# Patient Record
Sex: Male | Born: 1935
Health system: Southern US, Community
[De-identification: ages and names within clinical notes are randomized; demographics above are authoritative.]

## PROBLEM LIST (undated history)

## (undated) DIAGNOSIS — I10 Essential (primary) hypertension: Secondary | ICD-10-CM

## (undated) DIAGNOSIS — N400 Enlarged prostate without lower urinary tract symptoms: Secondary | ICD-10-CM

## (undated) DIAGNOSIS — R519 Headache, unspecified: Secondary | ICD-10-CM

## (undated) DIAGNOSIS — I495 Sick sinus syndrome: Secondary | ICD-10-CM

## (undated) DIAGNOSIS — I4819 Other persistent atrial fibrillation: Secondary | ICD-10-CM

## (undated) DIAGNOSIS — K219 Gastro-esophageal reflux disease without esophagitis: Secondary | ICD-10-CM

## (undated) DIAGNOSIS — D649 Anemia, unspecified: Secondary | ICD-10-CM

## (undated) DIAGNOSIS — C4491 Basal cell carcinoma of skin, unspecified: Secondary | ICD-10-CM

## (undated) DIAGNOSIS — E039 Hypothyroidism, unspecified: Secondary | ICD-10-CM

## (undated) DIAGNOSIS — Z992 Dependence on renal dialysis: Secondary | ICD-10-CM

## (undated) DIAGNOSIS — M1990 Unspecified osteoarthritis, unspecified site: Secondary | ICD-10-CM

## (undated) DIAGNOSIS — R51 Headache: Secondary | ICD-10-CM

## (undated) DIAGNOSIS — Z8701 Personal history of pneumonia (recurrent): Secondary | ICD-10-CM

## (undated) DIAGNOSIS — N186 End stage renal disease: Secondary | ICD-10-CM

## (undated) DIAGNOSIS — H9190 Unspecified hearing loss, unspecified ear: Secondary | ICD-10-CM

## (undated) DIAGNOSIS — G5602 Carpal tunnel syndrome, left upper limb: Secondary | ICD-10-CM

## (undated) DIAGNOSIS — I2510 Atherosclerotic heart disease of native coronary artery without angina pectoris: Secondary | ICD-10-CM

## (undated) DIAGNOSIS — Z950 Presence of cardiac pacemaker: Secondary | ICD-10-CM

## (undated) HISTORY — DX: Atherosclerotic heart disease of native coronary artery without angina pectoris: I25.10

## (undated) HISTORY — PX: COLONOSCOPY W/ BIOPSIES AND POLYPECTOMY: SHX1376

## (undated) HISTORY — DX: Other persistent atrial fibrillation: I48.19

## (undated) HISTORY — PX: APPENDECTOMY: SHX54

## (undated) HISTORY — PX: OTHER SURGICAL HISTORY: SHX169

## (undated) HISTORY — DX: End stage renal disease: Z99.2

## (undated) HISTORY — DX: Sick sinus syndrome: I49.5

## (undated) HISTORY — DX: Benign prostatic hyperplasia without lower urinary tract symptoms: N40.0

## (undated) HISTORY — PX: CHOLECYSTECTOMY: SHX55

## (undated) HISTORY — PX: TONSILLECTOMY: SUR1361

## (undated) HISTORY — DX: Basal cell carcinoma of skin, unspecified: C44.91

## (undated) HISTORY — PX: LAMINECTOMY: SHX219

## (undated) HISTORY — DX: Anemia, unspecified: D64.9

## (undated) HISTORY — DX: Hypothyroidism, unspecified: E03.9

## (undated) HISTORY — DX: Essential (primary) hypertension: I10

## (undated) HISTORY — PX: EYE SURGERY: SHX253

## (undated) HISTORY — DX: Dependence on renal dialysis: N18.6

## (undated) HISTORY — DX: Personal history of pneumonia (recurrent): Z87.01

---

## 1997-05-07 ENCOUNTER — Inpatient Hospital Stay (HOSPITAL_COMMUNITY): Admission: RE | Admit: 1997-05-07 | Discharge: 1997-05-08 | Payer: Self-pay | Admitting: Neurosurgery

## 2000-12-03 ENCOUNTER — Encounter: Payer: Self-pay | Admitting: Neurosurgery

## 2000-12-03 ENCOUNTER — Ambulatory Visit (HOSPITAL_COMMUNITY): Admission: RE | Admit: 2000-12-03 | Discharge: 2000-12-03 | Payer: Self-pay | Admitting: Neurosurgery

## 2000-12-20 ENCOUNTER — Encounter: Admission: RE | Admit: 2000-12-20 | Discharge: 2000-12-20 | Payer: Self-pay | Admitting: Neurosurgery

## 2000-12-20 ENCOUNTER — Encounter: Payer: Self-pay | Admitting: Neurosurgery

## 2001-08-30 ENCOUNTER — Encounter: Payer: Self-pay | Admitting: Orthopedic Surgery

## 2001-08-30 ENCOUNTER — Ambulatory Visit (HOSPITAL_COMMUNITY): Admission: RE | Admit: 2001-08-30 | Discharge: 2001-08-30 | Payer: Self-pay | Admitting: Orthopedic Surgery

## 2001-09-07 ENCOUNTER — Encounter: Payer: Self-pay | Admitting: Orthopedic Surgery

## 2001-09-07 ENCOUNTER — Encounter: Admission: RE | Admit: 2001-09-07 | Discharge: 2001-09-07 | Payer: Self-pay | Admitting: Orthopedic Surgery

## 2001-09-08 ENCOUNTER — Ambulatory Visit (HOSPITAL_BASED_OUTPATIENT_CLINIC_OR_DEPARTMENT_OTHER): Admission: RE | Admit: 2001-09-08 | Discharge: 2001-09-09 | Payer: Self-pay | Admitting: Orthopedic Surgery

## 2004-07-03 ENCOUNTER — Inpatient Hospital Stay (HOSPITAL_COMMUNITY): Admission: RE | Admit: 2004-07-03 | Discharge: 2004-07-04 | Payer: Self-pay | Admitting: Neurosurgery

## 2005-02-11 ENCOUNTER — Inpatient Hospital Stay (HOSPITAL_COMMUNITY): Admission: RE | Admit: 2005-02-11 | Discharge: 2005-02-13 | Payer: Self-pay | Admitting: Neurosurgery

## 2006-01-19 HISTORY — PX: PACEMAKER PLACEMENT: SHX43

## 2006-06-20 ENCOUNTER — Inpatient Hospital Stay (HOSPITAL_COMMUNITY): Admission: AD | Admit: 2006-06-20 | Discharge: 2006-06-25 | Payer: Self-pay | Admitting: Cardiology

## 2006-06-21 ENCOUNTER — Encounter: Payer: Self-pay | Admitting: Cardiology

## 2007-06-14 ENCOUNTER — Inpatient Hospital Stay (HOSPITAL_BASED_OUTPATIENT_CLINIC_OR_DEPARTMENT_OTHER): Admission: RE | Admit: 2007-06-14 | Discharge: 2007-06-14 | Payer: Self-pay | Admitting: Cardiology

## 2008-09-05 IMAGING — CR DG CHEST 1V PORT
1 series · 1 of 1 positions shown · non-contrast
Comparison: none

Clinical: Bradycardia

Portable chest at 6660:
Comparison 02/09/2005. Mild enlargement of cardiac silhouette. Lungs are clear.
No effusion. Tortuous thoracic aorta. Postop changes in the right shoulder.

[view not recorded]
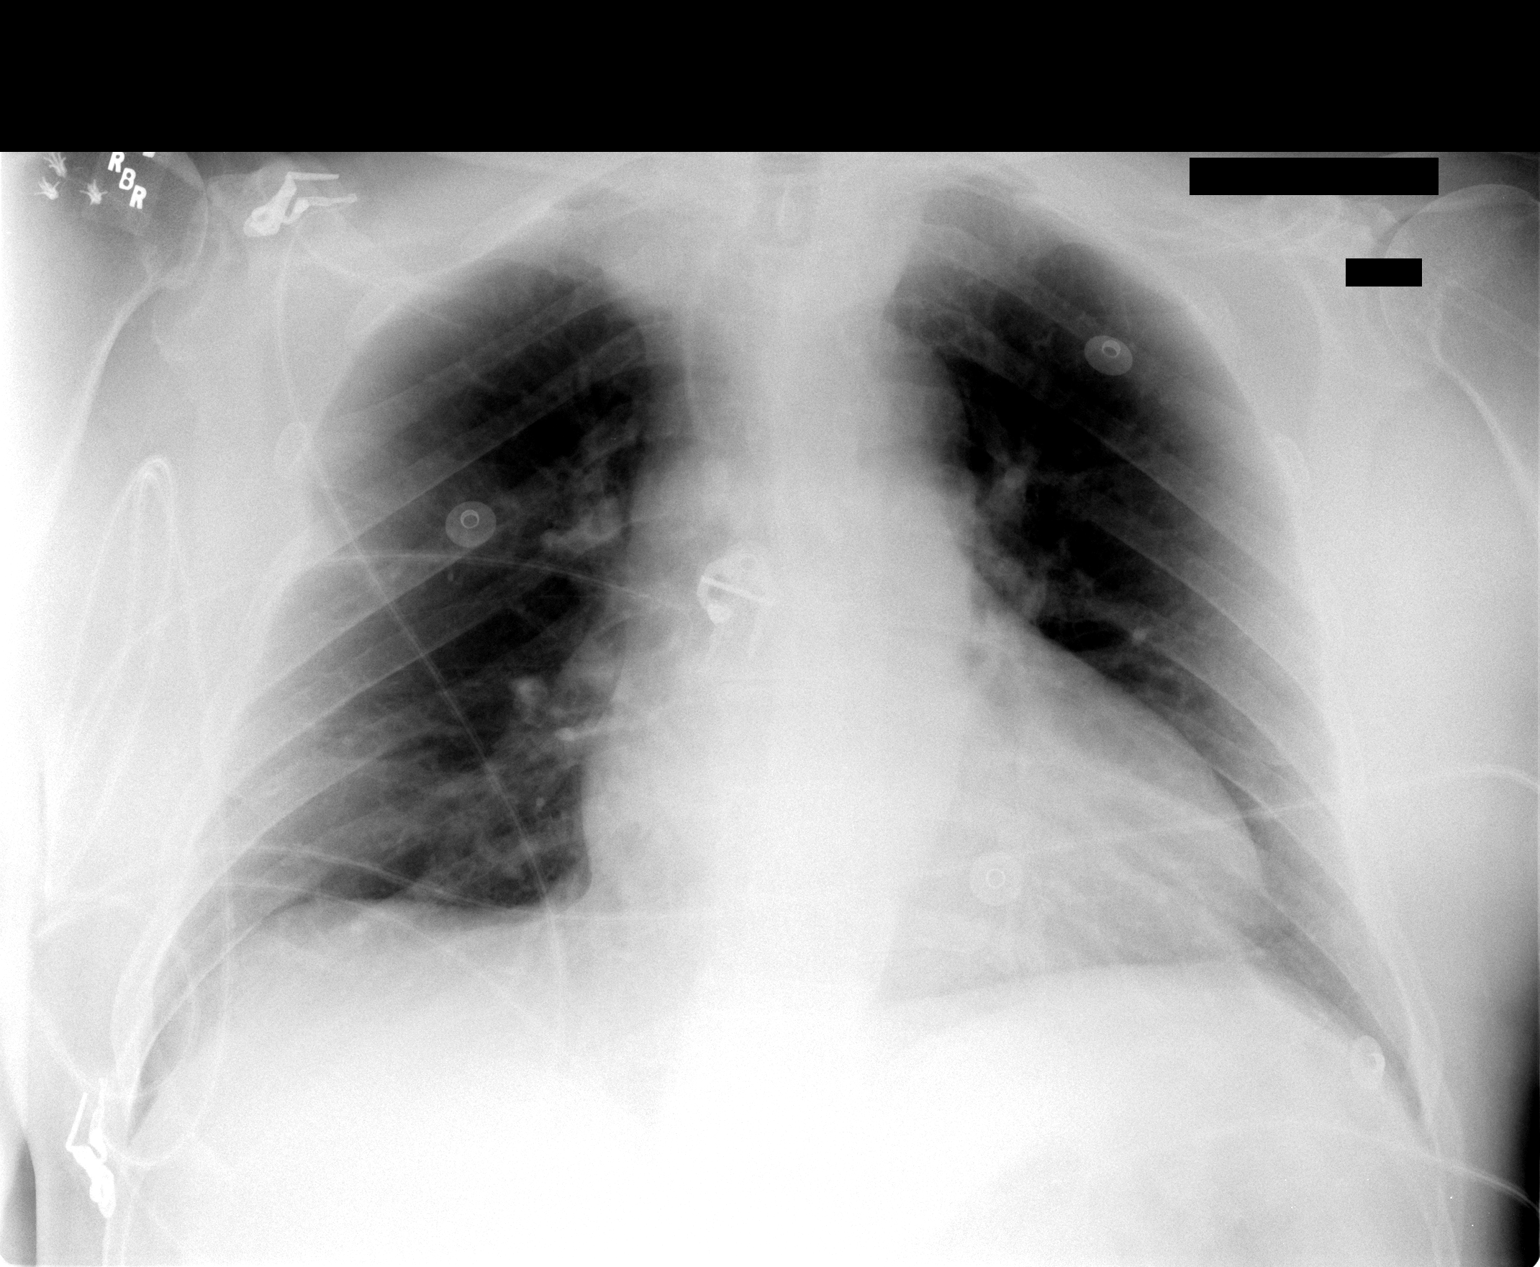

[1 of 1 positions shown; findings below may reference images not displayed]

IMPRESSION: 1. Stable mild cardiomegaly

## 2008-09-09 IMAGING — CR DG CHEST 1V PORT
1 series · 1 of 1 positions shown · non-contrast
Comparison: 06/20/06.

CLINICAL DATA: Bradycardia ? pacer placement. 
 PORTABLE CHEST ? 1 VIEW:

[view not recorded]
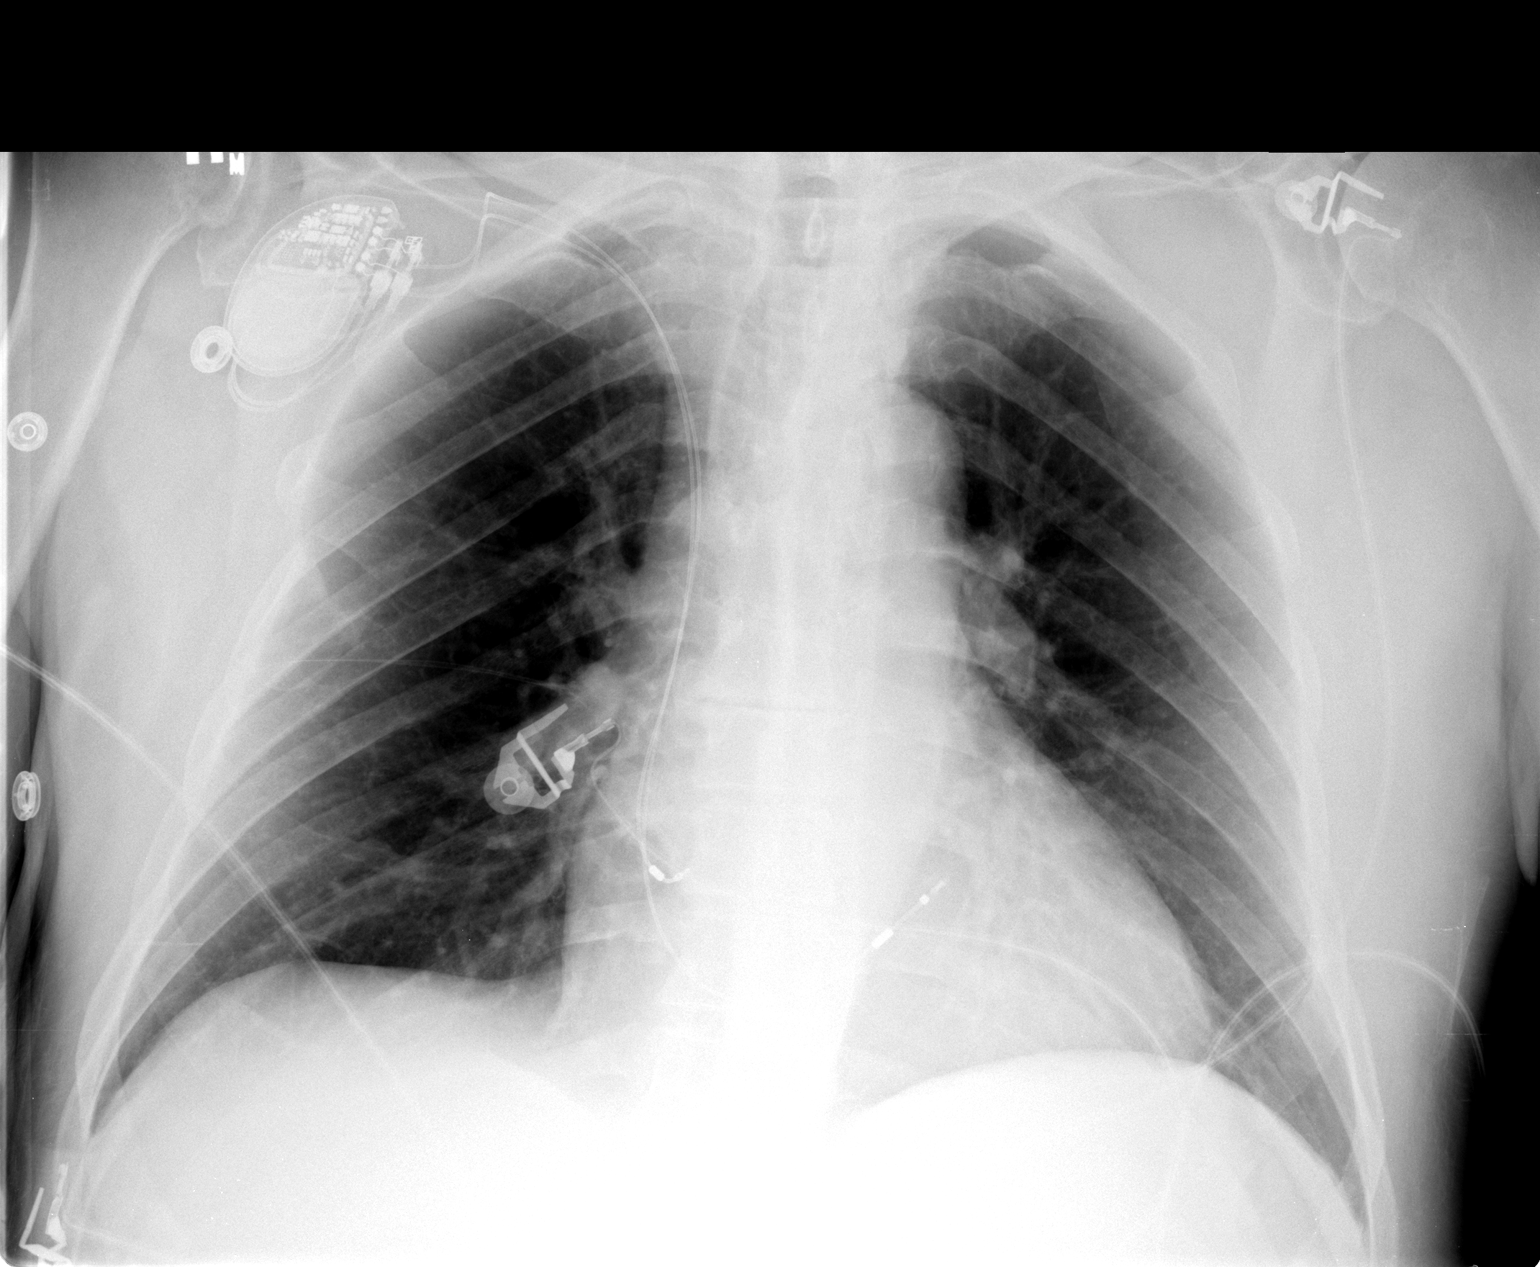

[1 of 1 positions shown; findings below may reference images not displayed]

FINDINGS: Heart size borderline enlarged considering AP projection.  There is no congestive heart failure or active disease.  A dual lead permanent cardiac pacer has been placed with RA and RV leads.  No pneumothorax.
IMPRESSION: 1.  Borderline cardiomegaly.  
 2.  Permanent cardiac pacer placed ? no pneumothorax. 
 3.  No active disease.

## 2009-06-03 ENCOUNTER — Encounter: Admission: RE | Admit: 2009-06-03 | Discharge: 2009-06-03 | Payer: Self-pay | Admitting: Cardiology

## 2009-10-21 ENCOUNTER — Ambulatory Visit: Payer: Self-pay | Admitting: Cardiology

## 2009-10-26 ENCOUNTER — Encounter: Payer: Self-pay | Admitting: Internal Medicine

## 2009-11-29 ENCOUNTER — Ambulatory Visit: Payer: Self-pay | Admitting: Internal Medicine

## 2010-02-18 NOTE — Assessment & Plan Note (Signed)
Summary: pacer check/medtronic   Current Medications (verified): 1)  Furosemide 80 Mg Tabs (Furosemide) .... Take 1 Tablet By Mouth Every Morning and 1/2 Tablet in Evening 2)  Levothyroxine Sodium 100 Mcg Tabs (Levothyroxine Sodium) .... Take 1 Tablet By Mouth Once Daily 3)  Amlodipine Besylate 5 Mg Tabs (Amlodipine Besylate) .... Take 1 Tablet By Mouth Once Daily 4)  Omeprazole 20 Mg Cpdr (Omeprazole) .... Take 1 Capsule By Mouth Once Daily 5)  Aspir-Low 81 Mg Tbec (Aspirin) .... Take 1 Tablet By Mouth Once Daily 6)  Flomax 0.4 Mg Caps (Tamsulosin Hcl) .... Take 1 Capsule By Mouth Once Daily 7)  Neurontin 400 Mg Caps (Gabapentin) .... Take 1 Capsule By Mouth Once Daily 8)  Amiodarone Hcl 200 Mg Tabs (Amiodarone Hcl) .... Take 1 Tablet By Mouth Once Daily 9)  Alprazolam 1 Mg Tabs (Alprazolam) .... Take 1 Tablet By Mouth As Needed 10)  Lortab 10-500 Mg Tabs (Hydrocodone-Acetaminophen) .... Take As Needed 11)  Ranexa 500 Mg Xr12h-Tab (Ranolazine) .... Take 1 Tablet By Mouth Once Daily 12)  Folic Acid .... Take 1 Tablet By Mouth Once Daily 13)  Multivitamins  Tabs (Multiple Vitamin) .... Take 1 Tablet By Mouth Once Daily 14)  Atenolol 25 Mg Tabs (Atenolol) .... Take 1 Tablet By Mouth Once Daily  Allergies (verified): 1)  ! * Oxycontin   PPM Specifications Following MD:  Thompson Grayer, MD     Referring MD:  Servando Salina PPM Vendor:  Medtronic     PPM Model Number:  [id number]     PPM Serial Number:  [id number] PPM DOI:  06/23/2006     PPM Implanting MD:  Romeo Apple, MD  Lead 1    Location: RA     DOI: 06/23/2006     Model #: [id number]     Serial #: [id number]     Status: active Lead 2    Location: RV     DOI: 06/23/2006     Model #: [id number]     Serial #: [id number]     Status: active  Magnet Response Rate:  BOL 85 ERI 65  Indications:  A-fib   PPM Follow Up Battery Voltage:  3.02 V       PPM Device Measurements Atrium  Amplitude: 1.1 mV, Impedance: 440 ohms, Threshold: 0.5 V at 0.4  msec Right Ventricle  Amplitude: 8.1 mV, Impedance: 512 ohms, Threshold: 1.0 V at 0.4 msec  Episodes MS Episodes:  0     Ventricular High Rate:  0     Atrial Pacing:  99.8%     Ventricular Pacing:  0.2%  Parameters Mode:  MVP     Lower Rate Limit:  60     Upper Rate Limit:  130 Paced AV Delay:  180     Sensed AV Delay:  150 Next Cardiology Appt Due:  03/10/2010 Tech Comments:  [facility]--NORMAL DEVICE FUNCTION.  NO EPISODES RECORDED SINCE LAST CHECK.  NO CHANGES MADE. PT TRANSFERRED THRU [facility].  ROV W/JA 03-10-10 @ 1430--PT AWARE OF APPT. PT WOULD LIKE TO CONTINUE [facility] CHECKS AFTER JA VISIT. Shelly Bombard  November 29, 2009 2:02 PM

## 2010-02-18 NOTE — Assessment & Plan Note (Signed)
Summary: pacer check/medtronic  Comments Pt was not seen this day (11/08/09).  He had another appt and could not wait. Appt was rescheduled to 11/11 and pt was seen on that day. Gurney Maxin, RN, BSN  December 06, 2009 9:38 AM     PPM Specifications Following MD:  Thompson Grayer, MD     Referring MD:  Servando Salina PPM Vendor:  Medtronic     PPM Model Number:  [id number]     [facility] Serial Number:  [id number] [facility] PPM DOI:  06/23/2006     PPM Implanting MD:  Romeo Apple, MD  Lead 1    Location: RA     DOI: 06/23/2006     Model #: [id number]     Serial #: [id number]     Status: active Lead 2    Location: RV     DOI: 06/23/2006     Model #: [id number]     Serial #: [id number]     Status: active  Magnet Response Rate:  BOL 85 ERI 65  Indications:  A-fib

## 2010-02-18 NOTE — Miscellaneous (Signed)
Summary: Device preload  Clinical Lists Changes  Observations: Added new observation of PPM INDICATN: A-fib (10/26/2009 14:12) Added new observation of MAGNET RTE: BOL 85 ERI 65 (10/26/2009 14:12) Added new observation of PPMLEADSTAT2: active (10/26/2009 14:12) Added new observation of PPMLEADSER2: [id number]  (10/26/2009 14:12) Added new observation of PPMLEADMOD2: 4470  (10/26/2009 14:12) Added new observation of PPMLEADDOI2: 06/23/2006  (10/26/2009 14:12) Added new observation of PPMLEADLOC2: RV  (10/26/2009 14:12) Added new observation of PPMLEADSTAT1: active  (10/26/2009 14:12) Added new observation of PPMLEADSER1: [id number]  (10/26/2009 14:12) Added new observation of PPMLEADMOD1: 4469  (10/26/2009 14:12) Added new observation of PPMLEADDOI1: 06/23/2006  (10/26/2009 14:12) Added new observation of PPMLEADLOC1: RA  (10/26/2009 14:12) Added new observation of PPM IMP MD: Romeo Apple, MD  (10/26/2009 14:12) Added new observation of PPM DOI: 06/23/2006  (10/26/2009 14:12) Added new observation of PPM SERL#: [id number]  (10/26/2009 14:12) Added new observation of PPM MODL#: [id number]  (10/26/2009 14:12) Added new observation of PACEMAKERMFG: Medtronic  (10/26/2009 14:12) Added new observation of PPM REFER MD: Dorothyann Peng Tennant,MD  (10/26/2009 14:12) Added new observation of PACEMAKER MD: Thompson Grayer, MD  (10/26/2009 14:12)      PPM Specifications Following MD:  Thompson Grayer, MD     Referring MD:  Servando Salina PPM Vendor:  Medtronic     PPM Model Number:  [id number]     PPM Serial Number:  [id number] PPM DOI:  06/23/2006     PPM Implanting MD:  Romeo Apple, MD  Lead 1    Location: RA     DOI: 06/23/2006     Model #: [id number]     Serial #: [id number]     Status: active Lead 2    Location: RV     DOI: 06/23/2006     Model #: [id number]     Serial #: [id number]     Status: active  Magnet Response Rate:  BOL 85 ERI 65  Indications:  A-fib

## 2010-02-18 NOTE — Cardiovascular Report (Signed)
Summary: Office Visit   Office Visit   Imported By: Sallee Provencal 12/09/2009 15:50:15  _____________________________________________________________________  External Attachment:    Type:   Image     Comment:   External Document

## 2010-03-10 ENCOUNTER — Encounter (INDEPENDENT_AMBULATORY_CARE_PROVIDER_SITE_OTHER): Payer: Medicare Other | Admitting: Internal Medicine

## 2010-03-10 ENCOUNTER — Encounter: Payer: Self-pay | Admitting: Internal Medicine

## 2010-03-10 DIAGNOSIS — I10 Essential (primary) hypertension: Secondary | ICD-10-CM | POA: Insufficient documentation

## 2010-03-10 DIAGNOSIS — I4891 Unspecified atrial fibrillation: Secondary | ICD-10-CM

## 2010-03-10 DIAGNOSIS — I495 Sick sinus syndrome: Secondary | ICD-10-CM

## 2010-03-18 NOTE — Cardiovascular Report (Signed)
Summary: Card Device Clinic/ QUICK LOOK  Card Device Clinic/ QUICK LOOK   Imported By: Bartholomew Boards 03/11/2010 16:38:41  _____________________________________________________________________  External Attachment:    Type:   Image     Comment:   External Document

## 2010-03-18 NOTE — Assessment & Plan Note (Signed)
Summary: MDT PPM/[facility] PT/[facility]   Visit Type:  Pacemaker check Referring Provider:  Dr Doreatha Lew Primary Provider:  Dr Manuella Ghazi  CC:  pacemaker check.  History of Present Illness: Dale Torres is a pleasant 77 yo WM with a h/o sick sinus syndrome, paroxysmal atrial fibrillation, and HTN s/p PPM (MDT) by Dr Doreatha Lew 2008 who presents to establish care in the pacemaker clinic.  He presenting in 2008 with presyncope and was found to have bradycardia as well as afib.  He underwent PPM (MDT) implantation by Dr Doreatha Lew 06/23/06.  HE has done well since that time.  He denies any further presyncope or syncope.  He has been asymptomatic with afib.  He underwent cath 2009 which revealed nonobstructive CAD.  The patient remains active.  He is primarily limited by DJD. He denies CP, palpitations, orthopnea, or PND.  HE reports dypsnea with moderate activity.  He is otherwise without complaint today.  Current Medications (verified): 1)  Furosemide 80 Mg Tabs (Furosemide) .... Take 1 Tablet By Mouth Every Morning and 1/2 Tablet in Evening 2)  Levothyroxine Sodium 100 Mcg Tabs (Levothyroxine Sodium) .... Take 1 Tablet By Mouth Once Daily 3)  Amlodipine Besylate 5 Mg Tabs (Amlodipine Besylate) .... Take 1 Tablet By Mouth Once Daily 4)  Omeprazole 20 Mg Cpdr (Omeprazole) .... Take 1 Capsule By Mouth Once Daily 5)  Aspir-Low 81 Mg Tbec (Aspirin) .... Take 1 Tablet By Mouth Once Daily 6)  Flomax 0.4 Mg Caps (Tamsulosin Hcl) .... Take 1 Capsule By Mouth Once Daily 7)  Neurontin 400 Mg Caps (Gabapentin) .... Take 1 Capsule By Mouth Once Daily 8)  Amiodarone Hcl 200 Mg Tabs (Amiodarone Hcl) .... Take 1 Tablet By Mouth Once Daily 9)  Alprazolam 1 Mg Tabs (Alprazolam) .... Take 1 Tablet By Mouth As Needed 10)  Lortab 10-500 Mg Tabs (Hydrocodone-Acetaminophen) .... Take As Needed 11)  Ranexa 500 Mg Xr12h-Tab (Ranolazine) .... Take 1 Tablet By Mouth Once Daily 12)  Folic Acid .... Take 1 Tablet By Mouth Once Daily 13)   Multivitamins  Tabs (Multiple Vitamin) .... Take 1 Tablet By Mouth Once Daily 14)  Atenolol 25 Mg Tabs (Atenolol) .... Take 1 Tablet By Mouth Once Daily  Allergies (verified): 1)  ! * Oxycontin  Past History:  Past Medical History: SSS s/p PPM nonosbructive CAD hypertension glucose intolerance atrial fibrillation DJD poor hearing  Family History: + CAD  Social History: lives in [place].  Denies tobacco drinks 3-4 ounzes of scotch per week  Review of Systems       All systems are reviewed and negative except as listed in the HPI.   Vital Signs:  Patient profile:   77 year old male Height:      71 inches Weight:      203 pounds BMI:     28.42 Pulse rate:   65 / minute Resp:     16 per minute BP sitting:   170 / 85  (left arm)  Vitals Entered By: Levora Angel, CNA (March 10, 2010 2:39 PM)  Physical Exam  General:  elderly male, NAD Head:  normocephalic and atraumatic Eyes:  PERRLA/EOM intact; conjunctiva and lids normal. Ears:  + hearing aides Neck:  supple Lungs:  Clear bilaterally to auscultation and percussion. Heart:  RRR, no m/r/g Abdomen:  Bowel sounds positive; abdomen soft and non-tender without masses, organomegaly, or hernias noted. No hepatosplenomegaly. Msk:  Back normal, normal gait. Muscle strength and tone normal. Extremities:  No clubbing or cyanosis.  no edema Neurologic:  Alert and oriented x 3. Skin:  Intact without lesions or rashes.   PPM Specifications Following MD:  Thompson Grayer, MD     Referring MD:  Servando Salina PPM Vendor:  Medtronic     PPM Model Number:  [id number]     PPM Serial Number:  [id number] PPM DOI:  06/23/2006     PPM Implanting MD:  Romeo Apple, MD  Lead 1    Location: RA     DOI: 06/23/2006     Model #: [id number]     Serial #: [id number]     Status: active Lead 2    Location: RV     DOI: 06/23/2006     Model #: [id number]     Serial #: [id number]     Status: active  Magnet Response Rate:  BOL 85 ERI 65  Indications:   A-fib   Parameters Mode:  MVP     Lower Rate Limit:  60     Upper Rate Limit:  130 Paced AV Delay:  180     Sensed AV Delay:  150 MD Comments:  see scanned report  Impression & Recommendations:  Problem # 1:  SICK SINUS SYNDROME (ICD-427.81) normal pacemaker function see scanned report no changes  Problem # 2:  ATRIAL FIBRILLATION (ICD-427.31) stable on amiodarone TFTs and LFTs every 6 months per Dr Manuella Ghazi pt is very clear in his decision to decline coumadin at this time  Problem # 3:  ESSENTIAL HYPERTENSION, BENIGN (ICD-401.1) above goal today pt reports good BP control at home salt restriction is advised he will keep a log of home BP values for Dr Manuella Ghazi  Patient Instructions: 1)  [facility]link checks every 3 months 2)  return in 12 months

## 2010-06-03 NOTE — Cardiovascular Report (Signed)
NAMEHEATH, PENDERS                ACCOUNT NO.:  [id number]   MEDICAL RECORD NO.:  [id number]          PATIENT TYPE:  INP   LOCATION:  2014                         FACILITY:  [facility]   PHYSICIAN:  Ludwig Lean. Doreatha Lew, M.D.DATE OF BIRTH:  1925-08-26   DATE OF PROCEDURE:  06/23/2006  DATE OF DISCHARGE:                            CARDIAC CATHETERIZATION   PROCEDURE:  Implantation of dual-chamber pulse generator under  fluoroscopy with conversion from atrial fibrillation to sinus rhythm  using ibutilide infusion.   DESCRIPTION OF PROCEDURE:  The right subclavicular area was prepped and  draped.  The area was infiltrated with 1% Xylocaine.  Subcutaneous  pocket was created in the prepectoral fascia.  Two punctures were made  in the subclavian vein over top of the first rib.  Using 7-French [facility]  introducers, the atrial and ventricular leads were introduced.  The  ventricular lead was a Guidant Bipolar endocardial pacing lead, serial  number [id number], 52-cm lead.  The ventricular thresholds were 0.4 V,  capture at 0.5 msec pulse width.  R waves were 10.7 mV.  Impedance was  593 ohms.  Current was 0.7 mA.   The atrial lead was a Guidant model 4469, 45-cm lead, serial number [id number]  [id number] active fixation lead.  It was positioned on the high atrial  septum in what was felt to be Bachman's bundle.  It was an active  fixation lead.  The thresholds initially were unobtainable because the  patient was in atrial fibrillation.  Ibutilide 1 mg was given and the  patient was observed x15 minutes with a converted sinus rhythm.  Then,  the thresholds were obtained which show a capture threshold at 0.4 V at  0.5 msec pulse width.  Impedance was 472 ohms and P waves were 1.7 mV.  Current was 1.1 mA.  The leads were sutured in place.  Wound was flushed  with kanamycin solution.  The leads were connected to a Medtronic  EnRhythm model P1501DR.  Serial number was [id number].  The pulse  generator was  sutured in place.  The wound was closed with 2-0 and  subsequently 4-0 Vicryl.  Steri-Strips were applied.  The patient  tolerated procedure well.      Ludwig Lean. Doreatha Lew, M.D.  Electronically Signed     SNT/MEDQ  D:  06/23/2006  T:  06/24/2006  Job:  [id number]

## 2010-06-03 NOTE — Cardiovascular Report (Signed)
NAMESEYMOUR, BENNETTS                ACCOUNT NO.:  [id number]   MEDICAL RECORD NO.:  [id number]          PATIENT TYPE:  OIB   LOCATION:  1966                         FACILITY:  [facility]   PHYSICIAN:  Ludwig Lean. Doreatha Lew, M.D.DATE OF BIRTH:  07-12-1925   DATE OF PROCEDURE:  06/14/2007  DATE OF DISCHARGE:  06/14/2007                            CARDIAC CATHETERIZATION   PROCEDURE:  Left heart catheterization with selective coronary  angiography and left ventricular angiography.   HISTORY:  Dale Torres is referred for catheterization because of a vague  episode of chest fullness.  He has had persistent shortness of breath.  He had a history of atrial fibrillation, but maintained in sinus rhythm  on amiodarone and implanted functioning dual-chamber pacemaker.  He has  underlying left bundle-branch block.   PROCEDURE:  Left heart catheterization with selective coronary  angiography and left ventricular angiography.   TYPE AND SITE OF ENTRY:  Percutaneous, right femoral artery.   CATHETERS:  1. A 4-French four curved Judkins left coronary catheter.  2. A 4-French 3-DRC right coronary catheter.  3. A 4-French pigtail ventriculographic catheter.   CONTRAST MATERIAL:  Omnipaque.   MEDICATIONS GIVEN PRIOR TO THE PROCEDURE:  Valium 10 mg.   MEDICATIONS GIVEN DURING THE PROCEDURE:  None.   COMMENT:  The patient did receive hydration and Mucomyst prior to the  procedure.   HEMODYNAMIC DATA:  The aortic pressure was 135/59.  LV was 136/21.  On  pullback, the aortic pressure was 146/51 and LV pressure was 147/10-18.  There was no aortic valve gradient on pullback.   ANGIOGRAPHIC DATA:  Left ventricular angiogram was performed in the RAO  position.  There was mild anterior hypokinesia of the global ejection  fraction estimated to be at 55-60%.   CORONARY ARTERIES:  1. Left main coronary artery is normal.  2. Intermediate coronary artery is large.  It is normal.  3. Left circumflex is a  moderately large vessel.  It is normal.  4. Left anterior descending had a 50-60% stenosis in its midportion.      There are minor irregularities, otherwise.  5. Right coronary artery was a large dominant vessel.  There are      irregularities present.   OVERALL IMPRESSION:  1. Mild anterior hypokinesis with overall well-preserved global left      ventricular function.  2. There is a borderline normal left ventricular filling pressures.  3. Minimal coronary atherosclerosis with 50%-60% narrowing in the mid      left anterior descending with irregularities in the right coronary      artery and vessels normal, otherwise.   DISCUSSION:  It is felt that Dale Torres's symptoms are not related to  coronary artery disease.  We will try to manage him otherwise and  consider the possibility of the addition Ranexa.      Ludwig Lean. Doreatha Lew, M.D.  Electronically Signed     SNT/MEDQ  D:  06/14/2007  T:  06/15/2007  Job:  [id number]   cc:   Jerene Bears, MD  Dr. Manuella Ghazi

## 2010-06-03 NOTE — H&P (Signed)
Dale Torres, Dale Torres                ACCOUNT NO.:  [id number]   MEDICAL RECORD NO.:  [id number]          PATIENT TYPE:  INP   LOCATION:  2014                         FACILITY:  [facility]   PHYSICIAN:  Ludwig Lean. Doreatha Lew, M.D.DATE OF BIRTH:  1926/01/07   DATE OF ADMISSION:  06/20/2006  DATE OF DISCHARGE:                              HISTORY & PHYSICAL   CHIEF COMPLAINT:  Dizziness and shortness of breath.   HISTORY OF PRESENT ILLNESS:  Dale Torres is a very pleasant 77 year old  male who is hard of hearing.  He presents from [facility] with  symptomatic bradycardia.  He was admitted there on Jun 18, 2006.  He has  had shortness of breath over the past 1 year.  It occurs with minimal  activities.  He has had no frank chest pain.  He has had dizziness,which  began about 3 weeks ago, that has progressively gotten worse.  He has  had no frank syncope.  It has been worse with activity as well.  His  atenolol has been cut back.  He was noted to be bradycardic upon his  arrival there.  His sodium was off at 124.  Hemoglobin was only 10.  He  was subsequently transferred for further evaluation.   PAST MEDICAL HISTORY:  1. Left bundle-branch block, questionably new.  2. Hearing loss.  3. GERD.  4. Hypertension for 15 years.  5. BPH.  6. Osteoarthritis.  7. A history of laminectomy in 1989 and 1999.  8. A history of appendectomy.  9. A history of cholecystectomy.  10.Recent eye surgery 1 month ago.   ALLERGIES:  QUESTIONABLE PERCODAN.   MEDICATIONS:  He believes he is taking lisinopril, Flomax, doxazosin,  hydrochlorothiazide, atenolol, and naproxen.   FAMILY HISTORY:  Positive for heart disease in his parents.  He has had  2 older sisters who have had heart disease as well.  One has had bypass  surgery.   SOCIAL HISTORY:  He is a widower for the past 2 years.  He lives with  his daughter.  He has 3 sons.  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  He is a remote smoker  and has had remote alcohol use, but not  at present.   REVIEW OF SYSTEMS:  Positive for constipation, blurred vision, as well  as diffuse joint pain.  He is on chronic narcotics.   PHYSICAL EXAMINATION:  GENERAL:  He is pleasant.  He is hard of hearing.  VITAL SIGNS:  Blood pressure 160/60.  His heart rate is about 80 at  present.  SKIN:  Warm and dry.  Color is unremarkable.  LUNGS:  Clear.  HEART:  Regular rhythm.  He has a grade 2/6 murmur.  ABDOMEN:  Unremarkable.  EXTREMITIES:  Bounding pulses with no edema and no deformity.   LABORATORY DATA:  His labs drawn earlier showed his BUN was 22,  creatinine was 1.4.  Sodium, on arrival was 124.  Yesterday, it was 128.  CK and troponin were negative.  His TSH was normal.  Hemoglobin was 10,  hematocrit 28.  His MCV is 92.   His EKG shows left bundle-branch block with marked bradycardia.  It is  not known if this is new or not.  He is in sinus rhythm.   IMPRESSION:  1. Bradycardia, symptomatic.  His atenolol has been discontinued.  2. Left bundle-branch block, questionably new.  3. Hyponatremia.  4. Anemia, questionable etiology.  5. Significant osteoarthritis.  6. Long-standing hypertension.   PLAN:  He is admitted.  We will watch him on telemetry.  His beta-  blockers have been discontinued already.  We will treat him with Norvasc  as well as continued low-dose ACE inhibitor.  He will be placed on fluid  restrictions.  Labs will be followed appropriately.  We will check a 2-D  echocardiogram but he may require cardiac catheterization.      Doyle Askew, N.P.      Ludwig Lean. Doreatha Lew, M.D.  Electronically Signed    LC/MEDQ  D:  06/20/2006  T:  06/20/2006  Job:  [id number]

## 2010-06-03 NOTE — H&P (Signed)
NAMEOBRIAN, BATTISTELLI                ACCOUNT NO.:  [id number]   MEDICAL RECORD NO.:  [id number]            PATIENT TYPE:   LOCATION:                                 FACILITY:   PHYSICIAN:  Ludwig Lean. Doreatha Lew, M.D.    DATE OF BIRTH:   DATE OF ADMISSION:  06/14/2007  DATE OF DISCHARGE:                              HISTORY & PHYSICAL   CHIEF COMPLAINT:  Persistent shortness of breath.   HISTORY OF PRESENT ILLNESS:  [name] is a 77 year old white male who  is referred for diagnostic cardiac catheterization.  He has had  shortness of breath that has been somewhat persistent.  He has had PFTs  that show mild impairment but really do not explain the degree of  shortness of breath that he is experiencing.  He had a negative  Cardiolite study in June 2008, which showed left bundle-branch block and  some mild transient ischemic dilatation that overall was felt to be  somewhat equivocal.  He did not have any real clear-cut ischemia.  He  has had an episode of chest discomfort approximately 3 weeks ago that  was somewhat vague.  He now presents for diagnostic cardiac  catheterization.   PAST MEDICAL HISTORY:  1. Tachybrady syndrome with subsequent pacemaker implantation in June      2008.  2. History of atrial fibrillation maintained in sinus rhythm on      amiodarone.  3. Past history of hyponatremia.  4. Hypertension.  5. Left bundle-branch block.  6. History of anemia.  7. Chronic pain syndrome.  8. BPH.  9. Hearing loss.  10.Mild renal insufficiency.   ALLERGIES:  Questionably PERCODAN.   PAST SURGICAL HISTORY:  1. Laminectomy x2.  2. Appendectomy.  3. Cholecystectomy.  4. Eye surgery.   CURRENT MEDICATIONS:  1. Atenolol 25 mg a day.  2. Baby aspirin a day.  3. Protonix 40 mg a day.  4. Flomax 0.4 daily.  5. Norvasc 5 mg a day.  6. Lisinopril 10 mg a day.  7. Neurontin 400 t.i.d.  8. Amiodarone 200 mg a day.  9. Xanax at bedtime p.r.n.  10.Hydrocodone p.r.n.  11.Lasix 40 mg a day.  12.Synthroid 75 mcg a day.   FAMILY HISTORY:  Positive for heart disease in his parents.  He had 2  older sisters who have had heart disease as well and 1 has had bypass  surgery.   SOCIAL HISTORY:  He is a widower for 3 years.  He has 4 children.  He is  retired from [employer] and [employer].  He is a remote smoker and he has  had remote alcohol use but none at the present.   REVIEW OF SYSTEMS:  As noted above.  He did have a recent visit to the  emergency room over the past weekend for a significant jaw pain, which  was felt to be due an infected tooth.  He also had an associated fever  and has been placed on antibiotics.  He had no further bouts of chest  pain.  He has had chronic lower extremity  edema.  He has had no syncope,  lightheadedness, or dizziness.  His rhythm has remained stable.   PHYSICAL EXAMINATION:  VITAL SIGNS:  His weight is 193 pounds; blood  pressure 150/70 sitting, 150/70 standing; heart rate is 60 and regular.  HEENT:  Unremarkable.  NECK:  Supple.  LUNGS:  Clear.  HEART:  Shows a regular rhythm.  Pacemaker is in the right pectoral  chest.  ABDOMEN:  Soft.  Positive bowel sounds, nontender.  EXTREMITIES:  Fleshy, but really no significant edema.   His pertinent labs are pending.   OVERALL IMPRESSION:  1. Persistent shortness of breath.  2. Vague episode of chest fullness.  3. Functioning dual-chamber pacemaker.  4. History of atrial fibrillation maintained in sinus rhythm on      amiodarone.  5. Hypothyroidism.  6. Left bundle-branch block.  7. Renal insufficiency.   PLAN:  We will proceed on with diagnostic cardiac catheterization.  The  patient will be pretreated with Mucomyst.  The risks, procedure, and  benefits have been explained, and he is willing to proceed on Tuesday,  Jun 14, 2007.      Doyle Askew, N.P.      Ludwig Lean. Doreatha Lew, M.D.  Electronically Signed   LC/MEDQ  D:  06/06/2007  T:  06/07/2007   Job:  [id number]

## 2010-06-03 NOTE — Discharge Summary (Signed)
Dale Torres, Dale Torres                ACCOUNT NO.:  [id number]   MEDICAL RECORD NO.:  [id number]          PATIENT TYPE:  INP   LOCATION:  2014                         FACILITY:  [facility]   PHYSICIAN:  Ludwig Lean. Doreatha Lew, M.D.DATE OF BIRTH:  1925-11-29   DATE OF ADMISSION:  06/20/2006  DATE OF DISCHARGE:  06/25/2006                               DISCHARGE SUMMARY   PRIMARY DISCHARGE DIAGNOSES:  1. Tachybrady syndrome in the setting of atrial fibrillation with      subsequent elective implantation of a dual chamber pacemaker,      Medtronic in rhythm P1501DR, serial number [id number].  2. Hyponatremia, now with discontinuation of hydrochlorothiazide.  3. Hypertension, currently controlled.  4. Left bundle branch block, of uncertain duration.  5. Anemia.  Iron studies are normal, now of uncertain etiology.  6. Chronic pain syndrome requiring chronic nonsteroidals and      narcotics.  7. Benign prostatic hypertrophy.  8. Hearing loss.  9. Mild renal insufficiency.   HISTORY OF PRESENT ILLNESS:  Dale Torres is a very pleasant 77 year old  white male who was referred for admission from [facility],  significant hearing loss.  He presented with symptomatic bradycardia.  He has had no frank chest pain.  He reported he had been dizzy for over  the past 3 weeks and had progressively got worse.  His atenolol had been  cut back.  When he arrived at [facility], he was noted to have a  sodium of 124.  His hemoglobin was only 10.  He was bradycardic.  He was  subsequently transferred to [facility] for further evaluation.   Please see dictated history and physical for further patient  presentation and profile.   LABORATORY DATA:  His BUN was 22, creatinine of 1.4, sodium 124.  His  cardiac enzymes are negative.  TSH was normal.  Hemoglobin was 10.  Crit  was 28.  His MCV is 92.  EKG showed a left bundle branch block with  marked bradycardia.  Chest x-ray showed stable mild  cardiomegaly.   A 2-D echocardiogram performed showed LV systolic function to be normal.  There appeared to be dyssynergy of the septal wall.  The left  ventricular wall thickness was mild to moderately increased, and the  left atrium was mild to moderately dilated.   HOSPITAL COURSE:  The patient was admitted.  He was placed on telemetry.  Beta blockers had already been discontinued.  His blood pressure was  treated with Norvasc, as well as low-dose ACE inhibitor.  He was placed  on fluid restrictions.  A 2D echocardiogram was obtained with those  results as noted above.  By June 22, 2006, he was noted to have a heart  rate up into the 140s, with atrial fibrillation.  The patient was  asymptomatic.  At this point in time, he was felt to have tachybrady  syndrome, and plans for permanent pacemaker implantation were carried  out.  It had been our plans to proceed on with cardiac catheterization  as well.  However, the patient had significant laboratory disorder with  a elevation  in BUN and a drop in sodium level again.  We therefore  placed our plans for cardiac catheterization on hold and proceeded on  with pacemaker implantation on June 4th.  That procedure was tolerated  well, without any known complications.  A dual chamber pacemaker  Medtronic, in rhythm P1501DR, serial number [id number] was implanted.  That procedure was tolerated well, without any known complications.  He  was also subsequently started on amiodarone and converted back to sinus  rhythm.  By the following day, he was doing well.  He has had  intermittent atrial fibrillation but is currently being loaded with  amiodarone.  He has also been noted to have a UTI on day 5 of Avelox.  He continues to have this persistent anemia of uncertain etiology, but  today on June 25, 1998, he is doing well without complaints.  His  pacemaker site is unremarkable.  He has been up and ambulatory without  problems, and it is felt that he  can be further managed as an  outpatient.   DISCHARGE CONDITION:  Stable.   DISCHARGE DIET:  Low-salt, heart-healthy.   His activity is to be increased as tolerated.  He is not to drive at the  present time.   We will follow up with him in 1 week, certainly sooner if any problems  arise in the interim, and he is asked to call the office to schedule  that appointment.   DISCHARGE MEDICINES:  1. Atenolol 25 mg a day.  2. Aspirin 81 mg a day.  3. Protonix 40 mg a day.  4. Flomax 0.4 daily.  5. Norvasc 5 mg a day.  6. Lisinopril 10 mg a day.  7. Neurontin 400 t.i.d.  8. Avelox 400 mg daily for three more days only, and then will be      discontinued.  9. Amiodarone 200 mg, taking 2 tablets two times a day for 1 week, and      then 1 tablet two times a day.  10.His Xanax and hydrocodone may be used as before, but he is      instructed to not take Naprosyn other nonsteroidals,      hydrochlorothiazide or doxazosin at this time.      Doyle Askew, N.P.      Ludwig Lean. Doreatha Lew, M.D.  Electronically Signed    LC/MEDQ  D:  06/25/2006  T:  06/25/2006  Job:  [id number]   cc:   Ludwig Lean. Doreatha Lew, M.D.  Monico Blitz, MD  [facility], Dr. Heriberto Antigua

## 2010-06-06 NOTE — Op Note (Signed)
NAMEALLISTER, Dale Torres                ACCOUNT NO.:  [id number]   MEDICAL RECORD NO.:  [id number]          PATIENT TYPE:  INP   LOCATION:  2899                         FACILITY:  [facility]   PHYSICIAN:  Hosie Spangle, M.D.DATE OF BIRTH:  1925-02-17   DATE OF PROCEDURE:  02/11/2005  DATE OF DISCHARGE:                                 OPERATIVE REPORT   PREOPERATIVE DIAGNOSES:  1.  Lumbar stenosis.  2.  Lumbar spondylosis.  3.  Lumbar degenerative disk disease.  4.  Neurogenic claudication.   POSTOPERATIVE DIAGNOSES:  1.  Lumbar stenosis.  2.  Lumbar spondylosis.  3.  Lumbar degenerative disk disease.  4.  Neurogenic claudication.   OPERATION PERFORMED:  1.  Removal of lumbar 2, 3 X-stop device.  2.  Lumbar 2 and lumbar 3 lumbar laminectomy.  3.  Decompression of the lumbar 3, 4 and lumbar 4, 5 levels, and the lumbar      2, lumbar 3 and lumbar 4 nerve roots with microdissection.   SURGEON:  Hosie Spangle, M.D.   ASSISTANT:  Leeroy Cha, M.D.   ANESTHESIA:  General endotracheal.   INDICATIONS:  The patient is a 77 year old man who presented with neurogenic  claudication and was found to have significant lumbar stenosis at the L2, 3  level and to a lesser extent at the L3, 4 level.  Decision was made to  proceed with decompressive lumbar laminectomy.  We placed an X-stop device  seven months ago.  Unfortunately he did not get sufficient relief of his  neurogenic claudication, and therefore returns  for a more formal  decompression.   DESCRIPTION OF THE OPERATION:  The patient was brought to the operating room  and was placed under general endotracheal anesthesia.  The patient turned to  a prone position. The lumbar region was prepped with Betadine soap and  solution, and draped in a sterile fashion.  The previously midline incision  was opened and extended rostrally and caudally.  The line of incision was  infiltrated with __________  1% with epinephrine.  Dissection  was carried  down to the subcutaneous tissue to the lumbar fascia, which was incised  bilaterally and the paraspinal muscles were dissected to the spinous  processes of the lamina in a subperiosteal fashion.  The X-stop device at  the L2, 3 level was dissected free of the surrounding scar tissue.  The  securing screw was loosened, then the wing was removed and then the larger  portion of the device was removed.   We then carefully dissected and exposed the lamina of L2 and L3, and then  initiated a laminectomy using double-action rongeur.  The microscope was  then draped and brought into the field to provide image magnification and  illumination for visualization.  The remainder of the decompression was  performed using microdissection using microsurgical technique.  Using the X-  max drill the laminectomy was extended.  We did a full L3 laminectomy as  well as an inferior L2 laminectomy.  With the lamina thinned we used a  Kerrison punch to achieve the laminectomy.  The ligamentum flavum was found  to be marked;y thickened, particularly at the L2, 3 level and to a lesser  extent at the L3, 4 level.  This was carefully removed, taking care to leave  the underlying thecal sac undisturbed.  There was spondylytic overgrowth  laterally, which included the ligamentum flavum and the fact capsule; and,  this was carefully removed.  We were able to decompress the spinal canal and  thecal sac as well as the lateral recesses and exiting neural foramina; and,  thereby decompressing the exiting nerve roots including the L2, L3 and L4  nerve roots bilaterally.   Once the decompression was completed hemostasis was established with th use  of Gelfoam soaked in thrombin.  Then the wound was closed in multiple  layers.  The paraspinal muscles were approximated with  undyed #1 Vicryl  sutures.  The deep fascia was closed with interrupted undyed 1-Vicryl  sutures and the subcutaneous and subcuticular  layers closed with interrupted  inverted 2-0 undyed Vicryl sutures.  The skin was reapproximated with  Dermabond.  The wound was dressed with Adaptic and sterile gauze.   The procedure was tolerated well.   ESTIMATED BLOOD LOSS:  The estimated blood loss was 100 mL.   COUNTS:  Sponge count was correct.   DISPOSITION:  Following .surgery the patient was returned back to the supine  position to be reversed from anesthetic, extubated and transferred to the  recovery room for further care.      Hosie Spangle, M.D.  Electronically Signed     RWN/MEDQ  D:  02/11/2005  T:  02/12/2005  Job:  [id number]

## 2010-06-06 NOTE — Op Note (Signed)
NAMECLIFORD, Dale Torres                ACCOUNT NO.:  [id number]   MEDICAL RECORD NO.:  [id number]          PATIENT TYPE:  INP   LOCATION:  2899                         FACILITY:  [facility]   PHYSICIAN:  Hosie Spangle, M.D.DATE OF BIRTH:  06/22/1925   DATE OF PROCEDURE:  07/03/2004  DATE OF DISCHARGE:                                 OPERATIVE REPORT   PREOPERATIVE DIAGNOSIS:  Lumbar stenosis, lumbar spondylosis, lumbar  degenerative disk disease and neurogenic claudication.   POSTOPERATIVE DIAGNOSIS:  Lumbar stenosis, lumbar spondylosis, lumbar  degenerative disk disease and neurogenic claudication.   OPERATION PERFORMED:  L2-3 decompression with implantation of X-Stop device.   SURGEON:  Hosie Spangle, M.D.   ANESTHESIA:  General endotracheal.   INDICATIONS FOR PROCEDURE:  The patient is a 77 year old man who presented  with neurogenic claudication and was found to have multifactorial lumbar  stenosis at the L2-3 level.  A decision was made to proceed with  decompression with implantation of an X-Stop device.   DESCRIPTION OF PROCEDURE:  The patient was brought to the operating room and  placed under general endotracheal anesthesia.  The patient was turned to a  prone position.  Lumbar region was prepped with DuraPrep and draped in  sterile fashion.  The midline was infiltrated with local anesthetic with  epinephrine.  C-arm fluoroscopic guidance was used to localize the L2-3  interspinous space.  Incision was made over the interspinous space and  carried down to the subcutaneous tissue.  The lumbar fascia which was  incised on each side of the midline leaving a thick cuff of interspinous  ligament.  The paraspinal muscles were then dissected from the spinous  processes in subperiosteal fashion bilaterally.  We then used the smaller  dilating tool to initiate the opening in the interspinous ligament  ventrally.  We then used the larger dilating tool and then the distractor.  Using the distractor we measured the amount of distraction and selected a 12  mm X-Stop device.  The spacer assembly was placed from the right side and  then wing assembly was initially attached to the spacer assembly with the  locking screw gently tightened and then using a clamp, we were able to  approximate the two sides of the device and then tighten the locking screw  with a torque hex driver using the handle for the spacer assembly as a  countertorque.  Once that was secured, a final lateral C-arm view and then  subsequent AP C-arm view were taken which showed the device in good  position.  We then proceeded with closure.  The lumbar fascia was closed  with interrupted undyed 1 Vicryl sutures on each side of the midline.  The  deep subcutaneous layer was closed with interrupted inverted 1 undyed Vicryl  sutures and the subcutaneous and subcuticular layer were closed with  interrupted inverted 2-0 undyed Vicryl sutures and skin edges closed with  Dermabond.  The patient tolerated the procedure well.  The estimated blood  loss  for this procedure was 25 mL.  Sponge, needle and instrument counts were  correct.  Following surgery the patient was turned back to supine position  to be reversed from anesthetic, extubated and transferred to recovery room  for further care.       RWN/MEDQ  D:  07/03/2004  T:  07/03/2004  Job:  [id number]

## 2010-06-06 NOTE — Op Note (Signed)
NAME:  Dale Torres, Dale Torres                          ACCOUNT NO.:  [id number]   MEDICAL RECORD NO.:  [id number]                   PATIENT TYPE:  AMB   LOCATION:  [place]                                  FACILITY:  [facility]   PHYSICIAN:  Rodney A. Alphonzo Cruise, M.D.           DATE OF BIRTH:  03-23-1925   DATE OF PROCEDURE:  09/08/2001  DATE OF DISCHARGE:  09/09/2001                                 OPERATIVE REPORT   PREOPERATIVE DIAGNOSIS:  Rotator cuff tear, right shoulder.   POSTOPERATIVE DIAGNOSIS:  Rotator cuff tear, right shoulder.   PROCEDURES:  1. Diagnostic arthroscopy, right shoulder.  2. Open Neer anterior one-third acromioplasty and repair of very large     retracted rotator cuff tear, right shoulder, using three Mitek sutures     and staples.   SURGEON:  Geroge Baseman. Alphonzo Cruise, M.D.   ANESTHESIA:  General.   DESCRIPTION OF PROCEDURE:  After satisfactory endotracheal anesthesia, the  patient was placed on the operating table in the sitting position.  The  right upper extremity and shoulder were prepped with Duraprep and draped out  in the usual manner.  An arthroscope was placed in the posterior portal and  the glenohumeral joint was evaluated.  The articular cartilage over the  humeral head and the glenoid was normal.  The anterior labrum appeared  normal.  There was a huge tear in the rotator cuff, which was retracted.  With the arthroscope again in the shoulder, the distal end of the humeral  head was totally uncovered.  The rotator cuff and the supraspinatus was torn  and retracted at least 2 cm, and the subacromial space could clearly be seen  with the arthroscope in the glenohumeral joint.   A sabre-cut incision then made over the anterior lateral aspect of the right  shoulder.  Skin edges were retracted.  Bleeders were coagulated.  Deltoid  fibers were released off the anterior aspect of the acromion only.  A self-  retaining retractor was put in place.  Using a power saw, a  very generous  Neer anterior one-third acromioplasty was done.  Once this was completed,  excellent access to the shoulder joint was achieved.  Antibiotic solution  was used to irrigate the shoulder throughout the procedure.  The  supraspinatus was retracted at least 2-2.5 cm proximally.  The cuff could be  brought down to an anatomic position.  Some adhesions were freed up with a  finger.  The subacromial bursa was excised.  The area of detachment of the  rotator cuff was debrided with a rongeur so there was some bleeding bone  available.  All debris was removed.  A series of three Mitek staples were  then inserted into the humerus at insertion area.  The sutures then brought  through the supraspinatus and advanced distally and tied down very snugly.  An anatomic repair was achieved, and a good watertight closure was achieved.  There was a great deal of retraction of the cuff, but this was brought down  anatomically.  With the arm at the side, there was not a great deal of  tension on the suture line.  Technically the procedure went extremely well.  The deltoid fibers then  reattached with 0 Vicryl, 2-0 Vicryl was used to close the subcutaneous  tissue, and stainless steel staples used to close the skin.  Sterile  dressings were applied, and the patient returned to the recovery room in  excellent condition.  Technically this went extremely well.  A good repair  was achieved.                                               Rodney A. Alphonzo Cruise, M.D.    RAM/MEDQ  D:  09/08/2001  T:  09/12/2001  Job:  [id number]

## 2010-06-12 ENCOUNTER — Encounter: Admitting: *Deleted

## 2010-06-13 ENCOUNTER — Encounter: Payer: Self-pay | Admitting: *Deleted

## 2010-06-19 ENCOUNTER — Ambulatory Visit (INDEPENDENT_AMBULATORY_CARE_PROVIDER_SITE_OTHER): Payer: Medicare Other | Admitting: *Deleted

## 2010-06-19 DIAGNOSIS — I495 Sick sinus syndrome: Secondary | ICD-10-CM

## 2010-06-20 ENCOUNTER — Encounter: Admitting: *Deleted

## 2010-06-24 ENCOUNTER — Encounter: Payer: Self-pay | Admitting: Cardiology

## 2010-06-25 ENCOUNTER — Ambulatory Visit: Admitting: Cardiology

## 2010-06-25 NOTE — Progress Notes (Signed)
PACER REMOTE

## 2010-07-01 ENCOUNTER — Encounter: Payer: Self-pay | Admitting: *Deleted

## 2010-07-01 ENCOUNTER — Encounter: Payer: Self-pay | Admitting: Cardiology

## 2010-07-21 ENCOUNTER — Ambulatory Visit (INDEPENDENT_AMBULATORY_CARE_PROVIDER_SITE_OTHER): Payer: Medicare Other | Admitting: Cardiology

## 2010-07-21 ENCOUNTER — Encounter: Payer: Self-pay | Admitting: Cardiology

## 2010-07-21 VITALS — BP 150/84 | HR 64 | Ht 68.0 in | Wt 199.0 lb

## 2010-07-21 DIAGNOSIS — N289 Disorder of kidney and ureter, unspecified: Secondary | ICD-10-CM

## 2010-07-21 DIAGNOSIS — N189 Chronic kidney disease, unspecified: Secondary | ICD-10-CM

## 2010-07-21 DIAGNOSIS — Z79899 Other long term (current) drug therapy: Secondary | ICD-10-CM

## 2010-07-21 DIAGNOSIS — I2510 Atherosclerotic heart disease of native coronary artery without angina pectoris: Secondary | ICD-10-CM

## 2010-07-21 DIAGNOSIS — N186 End stage renal disease: Secondary | ICD-10-CM | POA: Insufficient documentation

## 2010-07-21 DIAGNOSIS — I4891 Unspecified atrial fibrillation: Secondary | ICD-10-CM

## 2010-07-21 NOTE — Patient Instructions (Signed)
   Stop Ranexa Your physician recommends that you go to the [facility] for lab work for TSH, LFT, & chest x-ray. If the results of your test are normal or stable, you will receive a letter.  If they are abnormal, the nurse will contact you by phone. Your physician wants you to follow up in: 6 months.  You will receive a reminder letter in the mail one-two months in advance.  If you don't receive a letter, please call our office to schedule the follow up appointment

## 2010-07-21 NOTE — Assessment & Plan Note (Signed)
Followed by Dr. Shah. 

## 2010-07-21 NOTE — Assessment & Plan Note (Signed)
No reported angina with stable shortness of breath. Cardiac catheterization results are noted above showing documentation of nonobstructive disease. Plan to continue medical therapy and observation. We will discontinue Ranexa, particularly in light of his renal insufficiency and ongoing use of amiodarone.

## 2010-07-21 NOTE — Assessment & Plan Note (Signed)
No reported palpitations, atrial paced rhythm today. Plan to continue amiodarone, followup with TSH, liver function tests, and chest x-ray for surveillance. He continues to decline Coumadin.

## 2010-07-21 NOTE — Progress Notes (Signed)
Clinical Summary Dale Torres is a 77 y.o.male presenting to establish followup, former patient of Dr. Doreatha Lew. This is my first meeting with him. History is outlined below.  He has already established with Dr. Rayann Heman in our [place] [facility] for pacemaker followup.  He reports no progressive chest pain, dyspnea on exertion at NYHA class 2-3 which is chronic. Last chest x-ray from May 2011 was reviewed. Patient indicates no recent followup thyroid function or liver testing.  He reports no major palpitations, continues to decline Coumadin therapy. He seems to be tolerating amiodarone.  He voiced an interest in simplifying his medical regimen, and we reviewed his medications today.   Allergies  Allergen Reactions  . Oxycodone Hcl     Current outpatient prescriptions:ALPRAZolam (XANAX) 1 MG tablet, Take 1 mg by mouth at bedtime as needed.  , Disp: , Rfl: ;  amiodarone (PACERONE) 200 MG tablet, Take 200 mg by mouth daily.  , Disp: , Rfl: ;  amLODipine (NORVASC) 5 MG tablet, Take 5 mg by mouth daily.  , Disp: , Rfl: ;  aspirin 81 MG tablet, Take 81 mg by mouth daily.  , Disp: , Rfl: ;  atenolol (TENORMIN) 25 MG tablet, Take 25 mg by mouth daily.  , Disp: , Rfl:  folic acid (FOLVITE) 1 MG tablet, Take 1 mg by mouth daily.  , Disp: , Rfl: ;  furosemide (LASIX) 80 MG tablet, Take 80 mg by mouth every morning. And 1/2 by mouth in evening , Disp: , Rfl: ;  gabapentin (NEURONTIN) 400 MG capsule, Take 400 mg by mouth daily.  , Disp: , Rfl: ;  HYDROcodone-acetaminophen (NORCO) 10-325 MG per tablet, Take 1 tablet by mouth every 6 (six) hours as needed.  , Disp: , Rfl:  levothyroxine (SYNTHROID, LEVOTHROID) 100 MCG tablet, Take 100 mcg by mouth daily.  , Disp: , Rfl: ;  Multiple Vitamin (MULTIVITAMIN) tablet, Take 1 tablet by mouth daily.  , Disp: , Rfl: ;  omeprazole (PRILOSEC) 20 MG capsule, Take 20 mg by mouth daily.  , Disp: , Rfl: ;  Tamsulosin HCl (FLOMAX) 0.4 MG CAPS, Take 0.4 mg by mouth daily.  , Disp: ,  Rfl: ;  DISCONTD: ranolazine (RANEXA) 500 MG 12 hr tablet, Take 500 mg by mouth daily.  , Disp: , Rfl:  DISCONTD: HYDROcodone-acetaminophen (LORTAB) 10-500 MG per tablet, Take 1 tablet by mouth every 6 (six) hours as needed.  , Disp: , Rfl:   Past Medical History  Diagnosis Date  . Sick sinus syndrome     Status post pacemaker placement 2008  . Paroxysmal atrial fibrillation   . Essential hypertension, benign   . Benign prostatic hypertrophy   . Renal insufficiency   . Coronary atherosclerosis of native coronary artery     Nonobstructive 2009  . Anemia     Dr. Sonny Dandy    Past Surgical History  Procedure Date  . Laminectomy   . Appendectomy   . Cholecystectomy   . Eye surgery   . Right rotator cuff repair   . Pacemaker placement 2008    [employer] - Dr. Doreatha Lew    Family History  Problem Relation Age of Onset  . Coronary artery disease Father   . Coronary artery disease Mother   . Coronary artery disease Sister     Social History [name] reports that he quit smoking about 50 years ago. His smoking use included Cigarettes. He has a 9.6 pack-year smoking history. He quit smokeless tobacco use about 30  years ago. His smokeless tobacco use included Chew. [name] reports that he drinks alcohol.  Review of Systems As outlined above, otherwise negative.  Physical Examination Filed Vitals:   07/21/10 1001  BP: 150/84  Pulse: 64  Overweight elderly male in no acute distress. HEENT: Conjunctiva and lids normal, oropharynx with moist mucosa. Neck: Supple, no elevated JVP or carotid bruits, no thyromegaly or thyroid tenderness. Lungs: Decreased breath sounds but clear, nonlabored. Cardiac: Paradoxically split S2, soft systolic murmur, no S3. Abdomen: Soft, nontender, bowel sounds present. Skin: Warm and dry. Musculoskeletal: Mild kyphosis. Extremities: No pitting edema, distal pulses full. Neuropsychiatric: Alert and oriented x3, affect appropriate.   ECG Atrial paced  rhythm at 64 beats per minute with left bundle branch block.  Studies Cardiac catheterization 06/14/2007: ANGIOGRAPHIC DATA:  Left ventricular angiogram was performed in the RAO   position.  There was mild anterior hypokinesia of the global ejection   fraction estimated to be at 55-60%.      CORONARY ARTERIES:   1. Left main coronary artery is normal.   2. Intermediate coronary artery is large.  It is normal.   3. Left circumflex is a moderately large vessel.  It is normal.   4. Left anterior descending had a 50-60% stenosis in its midportion.       There are minor irregularities, otherwise.   5. Right coronary artery was a large dominant vessel.  There are       irregularities present.   Problem List and Plan

## 2010-09-18 ENCOUNTER — Other Ambulatory Visit: Payer: Self-pay

## 2010-09-18 ENCOUNTER — Encounter: Admitting: *Deleted

## 2010-09-18 ENCOUNTER — Encounter: Payer: Self-pay | Admitting: Internal Medicine

## 2010-09-18 ENCOUNTER — Ambulatory Visit (INDEPENDENT_AMBULATORY_CARE_PROVIDER_SITE_OTHER): Payer: Medicare Other | Admitting: *Deleted

## 2010-09-18 DIAGNOSIS — I4891 Unspecified atrial fibrillation: Secondary | ICD-10-CM

## 2010-09-18 DIAGNOSIS — I495 Sick sinus syndrome: Secondary | ICD-10-CM

## 2010-09-19 LAB — REMOTE PACEMAKER DEVICE
BAMS-0001: 170 {beats}/min
RV LEAD AMPLITUDE: 8.5 mv
RV LEAD IMPEDENCE PM: 472 Ohm

## 2010-09-24 ENCOUNTER — Encounter: Payer: Self-pay | Admitting: *Deleted

## 2010-10-03 NOTE — Progress Notes (Signed)
Pacer checked by remote

## 2010-11-06 LAB — CBC
HCT: 30.9 — ABNORMAL LOW
HCT: 31.5 — ABNORMAL LOW
Hemoglobin: 10.7 — ABNORMAL LOW
Hemoglobin: 10.7 — ABNORMAL LOW
Hemoglobin: 10.9 — ABNORMAL LOW
Hemoglobin: 9.5 — ABNORMAL LOW
MCHC: 34.6
MCHC: 34.9
MCHC: 35.3
MCV: 95.4
MCV: 95.9
MCV: 96.3
MCV: 97.5
Platelets: 172
RBC: 2.82 — ABNORMAL LOW
RBC: 3.23 — ABNORMAL LOW
RBC: 3.28 — ABNORMAL LOW
RDW: 12.9
RDW: 13.2
RDW: 13.5
WBC: 12 — ABNORMAL HIGH
WBC: 15.4 — ABNORMAL HIGH
WBC: 8.4

## 2010-11-06 LAB — COMPREHENSIVE METABOLIC PANEL
Albumin: 3.3 — ABNORMAL LOW
BUN: 16
Chloride: 100
GFR calc non Af Amer: >60
Potassium: 3.8
Sodium: 131 — ABNORMAL LOW
Total Bilirubin: 0.6
Total Protein: 5.4 — ABNORMAL LOW

## 2010-11-06 LAB — BASIC METABOLIC PANEL
BUN: 25 — ABNORMAL HIGH
BUN: 35 — ABNORMAL HIGH
CO2: 25
CO2: 26
CO2: 26
CO2: 27
Calcium: 8.1 — ABNORMAL LOW
Calcium: 8.2 — ABNORMAL LOW
Calcium: 8.7
Calcium: 9
Chloride: 101
Chloride: 101
Chloride: 95 — ABNORMAL LOW
Chloride: 98
Creatinine, Ser: 1.36
Creatinine, Ser: 1.39
Creatinine, Ser: 1.45
GFR calc Af Amer: 54 — ABNORMAL LOW
GFR calc Af Amer: 57 — ABNORMAL LOW
GFR calc Af Amer: 58 — ABNORMAL LOW
GFR calc Af Amer: >60
GFR calc Af Amer: >60
GFR calc non Af Amer: 45 — ABNORMAL LOW
GFR calc non Af Amer: 48 — ABNORMAL LOW
Glucose, Bld: 117 — ABNORMAL HIGH
Glucose, Bld: 174 — ABNORMAL HIGH
Potassium: 4
Potassium: 4.3
Potassium: 4.4
Sodium: 128 — ABNORMAL LOW
Sodium: 129 — ABNORMAL LOW
Sodium: 132 — ABNORMAL LOW
Sodium: 136

## 2010-11-06 LAB — URINE MICROSCOPIC-ADD ON

## 2010-11-06 LAB — URINALYSIS, ROUTINE W REFLEX MICROSCOPIC
Ketones, ur: NEGATIVE
Protein, ur: NEGATIVE
Specific Gravity, Urine: 1.015
Urobilinogen, UA: 0.2
pH: 6

## 2010-11-06 LAB — IRON AND TIBC: UIBC: 207

## 2010-11-06 LAB — CK TOTAL AND CKMB (NOT AT ARMC)
CK, MB: 1.6
Total CK: 66

## 2010-11-06 LAB — APTT: aPTT: 31

## 2010-11-06 LAB — TROPONIN I: Troponin I: 0.02

## 2010-11-06 LAB — B-NATRIURETIC PEPTIDE (CONVERTED LAB): Pro B Natriuretic peptide (BNP): 190 — ABNORMAL HIGH

## 2010-12-18 ENCOUNTER — Ambulatory Visit (INDEPENDENT_AMBULATORY_CARE_PROVIDER_SITE_OTHER): Payer: Medicare Other | Admitting: *Deleted

## 2010-12-18 ENCOUNTER — Other Ambulatory Visit: Payer: Self-pay

## 2010-12-18 ENCOUNTER — Encounter: Payer: Self-pay | Admitting: Internal Medicine

## 2010-12-18 DIAGNOSIS — I495 Sick sinus syndrome: Secondary | ICD-10-CM

## 2010-12-18 DIAGNOSIS — I4891 Unspecified atrial fibrillation: Secondary | ICD-10-CM

## 2010-12-19 LAB — REMOTE PACEMAKER DEVICE
AL AMPLITUDE: 1.4 mv
AL IMPEDENCE PM: 424 Ohm
BATTERY VOLTAGE: 3 V
RV LEAD AMPLITUDE: 8.1 mv
RV LEAD IMPEDENCE PM: 472 Ohm
VENTRICULAR PACING PM: 0.37

## 2010-12-22 ENCOUNTER — Encounter: Payer: Self-pay | Admitting: Cardiology

## 2010-12-23 NOTE — Progress Notes (Signed)
Remote pacer check  

## 2010-12-31 ENCOUNTER — Ambulatory Visit (INDEPENDENT_AMBULATORY_CARE_PROVIDER_SITE_OTHER): Payer: Medicare Other | Admitting: Cardiology

## 2010-12-31 ENCOUNTER — Encounter: Payer: Self-pay | Admitting: Cardiology

## 2010-12-31 VITALS — BP 164/83 | HR 62 | Ht 68.0 in | Wt 202.0 lb

## 2010-12-31 DIAGNOSIS — I495 Sick sinus syndrome: Secondary | ICD-10-CM

## 2010-12-31 DIAGNOSIS — I2510 Atherosclerotic heart disease of native coronary artery without angina pectoris: Secondary | ICD-10-CM

## 2010-12-31 DIAGNOSIS — I4891 Unspecified atrial fibrillation: Secondary | ICD-10-CM

## 2010-12-31 DIAGNOSIS — I10 Essential (primary) hypertension: Secondary | ICD-10-CM

## 2010-12-31 NOTE — Assessment & Plan Note (Signed)
Blood pressure is elevated today. Patient reports medication compliance. Recommended continued followup with Dr. Manuella Ghazi.

## 2010-12-31 NOTE — Assessment & Plan Note (Signed)
Current regimen includes aspirin and amiodarone which he has tolerated. Lab work and chest x-ray are stable as of last visit. He has declined Coumadin or other anticoagulant. Continue observation.

## 2010-12-31 NOTE — Progress Notes (Signed)
Clinical Summary Dale Torres is a 77 y.o.male presenting for followup. He was seen in July. He reports no signficant palpitations or chest pain. Indicates compliance with his medications. He noted no significant change after we stopped Ranexa at the last visit.  Followup lab work for liver function and TSH was normal back in July. Chest x-ray from that time showed no acute findings with dual-chamber pacer in place.  Continues to prefer aspirin to anticoagulant therapy. He is due for followup pacer evaluation in February.   Allergies  Allergen Reactions  . Oxycodone Hcl     Medication list reviewed.  Past Medical History  Diagnosis Date  . Sick sinus syndrome     Status post pacemaker placement 2008  . Paroxysmal atrial fibrillation   . Essential hypertension, benign   . Benign prostatic hypertrophy   . Renal insufficiency   . Coronary atherosclerosis of native coronary artery     Nonobstructive 2009  . Anemia     Dr. Sonny Torres    Past Surgical History  Procedure Date  . Laminectomy   . Appendectomy   . Cholecystectomy   . Eye surgery   . Right rotator cuff repair   . Pacemaker placement 2008    [employer] - Dr. Doreatha Torres    Family History  Problem Relation Age of Onset  . Coronary artery disease Father   . Coronary artery disease Mother   . Coronary artery disease Sister     Social History Dale Torres reports that he quit smoking about 50 years ago. His smoking use included Cigarettes. He has a 9.6 pack-year smoking history. He quit smokeless tobacco use about 30 years ago. His smokeless tobacco use included Chew. Dale Torres reports that he drinks alcohol.  Review of Systems Chronic dyspnea on exertion, unchanged. No reported falls. Arthritic complaints. Otherwise negative except as outlined.  Physical Examination Filed Vitals:   12/31/10 1112  BP: 164/83  Pulse: 90    Overweight elderly male in no acute distress.  HEENT: Conjunctiva and lids normal, oropharynx  with moist mucosa.  Neck: Supple, no elevated JVP or carotid bruits, no thyromegaly or thyroid tenderness.  Lungs: Decreased breath sounds but clear, nonlabored.  Cardiac: Paradoxically split S2, soft systolic murmur, no S3.  Abdomen: Soft, nontender, bowel sounds present.  Extremities: No pitting edema, distal pulses full.    ECG Reviewed in EMR.    Problem List and Plan

## 2010-12-31 NOTE — Assessment & Plan Note (Signed)
Nonobstructive disease based on prior assessment, no active angina.

## 2010-12-31 NOTE — Patient Instructions (Signed)
Your physician wants you to follow-up in: 6 months. You will receive a reminder letter in the mail one-two months in advance. If you don't receive a letter, please call our office to schedule the follow-up appointment. Your physician recommends that you continue on your current medications as directed. Please refer to the Current Medication list given to you today. 

## 2010-12-31 NOTE — Assessment & Plan Note (Signed)
Status post pacemaker placement, followed by Dr. Rayann Heman.

## 2011-01-15 ENCOUNTER — Encounter: Payer: Self-pay | Admitting: *Deleted

## 2011-02-27 ENCOUNTER — Other Ambulatory Visit: Payer: Self-pay | Admitting: Internal Medicine

## 2011-02-27 ENCOUNTER — Telehealth: Payer: Self-pay | Admitting: *Deleted

## 2011-02-27 ENCOUNTER — Encounter: Payer: Self-pay | Admitting: Internal Medicine

## 2011-02-27 ENCOUNTER — Encounter: Payer: Self-pay | Admitting: *Deleted

## 2011-02-27 ENCOUNTER — Ambulatory Visit (INDEPENDENT_AMBULATORY_CARE_PROVIDER_SITE_OTHER): Payer: Medicare Other | Admitting: Internal Medicine

## 2011-02-27 DIAGNOSIS — I4891 Unspecified atrial fibrillation: Secondary | ICD-10-CM

## 2011-02-27 DIAGNOSIS — R0602 Shortness of breath: Secondary | ICD-10-CM

## 2011-02-27 DIAGNOSIS — I495 Sick sinus syndrome: Secondary | ICD-10-CM

## 2011-02-27 LAB — PACEMAKER DEVICE OBSERVATION
AL IMPEDENCE PM: 432 Ohm
ATRIAL PACING PM: 97.65
BATTERY VOLTAGE: 2.93 V
RV LEAD IMPEDENCE PM: 448 Ohm
RV LEAD THRESHOLD: 1 V
VENTRICULAR PACING PM: 1.62

## 2011-02-27 NOTE — Patient Instructions (Signed)
See instruction sheet for pacemaker generator change.

## 2011-02-27 NOTE — Telephone Encounter (Signed)
Pt is scheduled for device change out on 2/12 with Dr. Rayann Heman.

## 2011-02-27 NOTE — Progress Notes (Signed)
PCP:  Monico Blitz, MD, MD  The patient presents today for routine electrophysiology followup.  Since last being seen in our clinic, the patient reports doing reasonably well.  He reports abrupt decrease in exercise tolerance over the past week.  He has not felt like leaving the house.  Today, he denies symptoms of palpitations, chest pain, shortness of breath,lower extremity edema, dizziness, presyncope, syncope, or neurologic sequela.  The patient feels that he is tolerating medications without difficulties and is otherwise without complaint today.   Past Medical History  Diagnosis Date  . Sick sinus syndrome     Status post pacemaker placement 2008  . Paroxysmal atrial fibrillation     declines coumadin  . Essential hypertension, benign   . Benign prostatic hypertrophy   . Renal insufficiency   . Coronary atherosclerosis of native coronary artery     Nonobstructive 2009  . Anemia     Dr. Sonny Dandy   Past Surgical History  Procedure Date  . Laminectomy   . Appendectomy   . Cholecystectomy   . Eye surgery   . Right rotator cuff repair   . Pacemaker placement 2008    [employer] - Dr. Doreatha Lew    Current Outpatient Prescriptions  Medication Sig Dispense Refill  . ALPRAZolam (XANAX) 1 MG tablet Take 1 mg by mouth at bedtime as needed.        Marland Kitchen amiodarone (PACERONE) 200 MG tablet Take 200 mg by mouth daily.        Marland Kitchen amLODipine (NORVASC) 5 MG tablet Take 5 mg by mouth daily.        Marland Kitchen aspirin 81 MG tablet Take 81 mg by mouth daily.        Marland Kitchen atenolol (TENORMIN) 25 MG tablet Take 25 mg by mouth daily.        . folic acid (FOLVITE) 1 MG tablet Take 1 mg by mouth daily.        . furosemide (LASIX) 80 MG tablet Take 80 mg by mouth every morning. And 1/2 by mouth in evening       . gabapentin (NEURONTIN) 400 MG capsule Take 400 mg by mouth daily.        Marland Kitchen HYDROcodone-acetaminophen (NORCO) 10-325 MG per tablet Take 1 tablet by mouth every 6 (six) hours as needed.        Marland Kitchen levothyroxine  (SYNTHROID, LEVOTHROID) 100 MCG tablet Take 100 mcg by mouth daily.        Marland Kitchen lisinopril (PRINIVIL,ZESTRIL) 10 MG tablet Take 10 mg by mouth daily.      . Multiple Vitamin (MULTIVITAMIN) tablet Take 1 tablet by mouth daily.        Marland Kitchen omeprazole (PRILOSEC) 20 MG capsule Take 20 mg by mouth daily.        . Tamsulosin HCl (FLOMAX) 0.4 MG CAPS Take 0.4 mg by mouth daily.          Allergies  Allergen Reactions  . Oxycodone Hcl     History   Social History  . Marital Status: Widowed    Spouse Name: N/A    Number of Children: N/A  . Years of Education: N/A   Occupational History  . RETIRED    Social History Main Topics  . Smoking status: Former Smoker -- 0.8 packs/day for 12 years    Types: Cigarettes    Quit date: 01/20/1960  . Smokeless tobacco: Former Systems developer    Types: Chew    Quit date: 01/20/1980   Comment: chewed tobacco x's 10  years while playing golf only,used a pack per week  . Alcohol Use: Yes     DRINKS 3-4 OZS OF SCOTCH PER WEEK  . Drug Use: No  . Sexually Active: Not on file   Other Topics Concern  . Not on file   Social History Narrative  . No narrative on file    Family History  Problem Relation Age of Onset  . Coronary artery disease Father   . Coronary artery disease Mother   . Coronary artery disease Sister     ROS-  All systems are reviewed and are negative except as outlined in the HPI above   Physical Exam: Filed Vitals:   02/27/11 0855  BP: 130/82  Pulse: 74  Height: 5\' 8"  (1.727 m)  Weight: 209 lb (94.802 kg)  SpO2: 90%    GEN- The patient is elderly appearing, alert and oriented x 3 today.   Head- normocephalic, atraumatic Eyes-  Sclera clear, conjunctiva pink Ears- hearing aides in place Oropharynx- clear Neck- supple, no JVP Lymph- no cervical lymphadenopathy Lungs- Clear to ausculation bilaterally, normal work of breathing Chest- R sided pacemaker pocket is well healed Heart- Regular rate and rhythm, no murmurs, rubs or gallops,  PMI not laterally displaced GI- soft, NT, ND, + BS Extremities- no clubbing, cyanosis, or edema  Pacemaker interrogation- reviewed in detail today,  See PACEART report  Assessment and Plan:

## 2011-02-27 NOTE — Assessment & Plan Note (Signed)
Pacemaker is at [facility].  He reached ERI 02/20/11.  This temporally correlates to his decrease in exercise tolerance.  Hopefully his symptoms will improve with generator change. Risks, benefits, and alternatives to PPM gen change were discussed at length with the patient who wishes to proceed.  He understands that risks include but are not limited to bleeding, infection, pneumothorax, perforation, tamponade, vascular damage, renal failure, MI, stroke, death,  and lead dislodgement and wishes to proceed. We will therefore schedule the procedure at the next available time.

## 2011-02-27 NOTE — Telephone Encounter (Signed)
Medicare,[employer].  No precert required

## 2011-02-27 NOTE — Assessment & Plan Note (Signed)
In sinus today His CHADS2 score is at least 2 (age, HTN).  He should be either on coumadin or a  Novel anticoagulant.  He is clear in his decision to decline these medicines at this time.

## 2011-03-02 ENCOUNTER — Encounter (HOSPITAL_COMMUNITY): Payer: Self-pay | Admitting: Pharmacy Technician

## 2011-03-02 MED ORDER — SODIUM CHLORIDE 0.45 % IV SOLN
INTRAVENOUS | Status: DC
  Administered 2011-03-03: 13:00:00 via INTRAVENOUS

## 2011-03-02 MED ORDER — SODIUM CHLORIDE 0.9 % IR SOLN
80.0000 mg | Status: AC
  Filled 2011-03-02: qty 2

## 2011-03-02 MED ORDER — CEFAZOLIN SODIUM-DEXTROSE 2-3 GM-% IV SOLR
2.0000 g | INTRAVENOUS | Status: DC
  Filled 2011-03-02: qty 50

## 2011-03-03 ENCOUNTER — Telehealth: Payer: Self-pay | Admitting: *Deleted

## 2011-03-03 ENCOUNTER — Ambulatory Visit (HOSPITAL_COMMUNITY)
Admission: RE | Admit: 2011-03-03 | Discharge: 2011-03-03 | Disposition: A | Discharge status: Home / Self Care | Payer: Medicare Other | Source: Ambulatory Visit | Attending: Internal Medicine | Admitting: Internal Medicine

## 2011-03-03 ENCOUNTER — Encounter (HOSPITAL_COMMUNITY)
Admission: RE | Disposition: A | Discharge status: Home / Self Care | Payer: Self-pay | Source: Ambulatory Visit | Attending: Internal Medicine

## 2011-03-03 DIAGNOSIS — T82190A Other mechanical complication of cardiac electrode, initial encounter: Secondary | ICD-10-CM

## 2011-03-03 DIAGNOSIS — N186 End stage renal disease: Secondary | ICD-10-CM | POA: Insufficient documentation

## 2011-03-03 DIAGNOSIS — I2510 Atherosclerotic heart disease of native coronary artery without angina pectoris: Secondary | ICD-10-CM | POA: Insufficient documentation

## 2011-03-03 DIAGNOSIS — Z45018 Encounter for adjustment and management of other part of cardiac pacemaker: Secondary | ICD-10-CM | POA: Insufficient documentation

## 2011-03-03 DIAGNOSIS — I4891 Unspecified atrial fibrillation: Secondary | ICD-10-CM | POA: Insufficient documentation

## 2011-03-03 DIAGNOSIS — I495 Sick sinus syndrome: Secondary | ICD-10-CM

## 2011-03-03 DIAGNOSIS — I10 Essential (primary) hypertension: Secondary | ICD-10-CM | POA: Insufficient documentation

## 2011-03-03 HISTORY — PX: PACEMAKER GENERATOR CHANGE: SHX5998

## 2011-03-03 HISTORY — PX: PERMANENT PACEMAKER GENERATOR CHANGE: SHX6022

## 2011-03-03 SURGERY — PERMANENT PACEMAKER GENERATOR CHANGE
Anesthesia: LOCAL

## 2011-03-03 MED ORDER — HEPARIN (PORCINE) IN NACL 2-0.9 UNIT/ML-% IJ SOLN
INTRAMUSCULAR | Status: AC
  Filled 2011-03-03: qty 1000

## 2011-03-03 MED ORDER — ONDANSETRON HCL 4 MG/2ML IJ SOLN: 4.0000 mg | Freq: Four times a day (QID) | INTRAMUSCULAR | Status: DC | PRN

## 2011-03-03 MED ORDER — CEFAZOLIN SODIUM 1-5 GM-% IV SOLN: 1.0000 g | Freq: Four times a day (QID) | INTRAVENOUS | Status: DC

## 2011-03-03 MED ORDER — MUPIROCIN 2 % EX OINT
TOPICAL_OINTMENT | CUTANEOUS | Status: AC
  Administered 2011-03-03: 12:00:00
  Filled 2011-03-03: qty 22

## 2011-03-03 MED ORDER — CEFAZOLIN SODIUM-DEXTROSE 2-3 GM-% IV SOLR
INTRAVENOUS | Status: AC
  Filled 2011-03-03: qty 50

## 2011-03-03 MED ORDER — CHLORHEXIDINE GLUCONATE 4 % EX LIQD
60.0000 mL | Freq: Once | CUTANEOUS | Status: AC
  Administered 2011-03-03: 4 via TOPICAL

## 2011-03-03 MED ORDER — LIDOCAINE HCL (PF) 1 % IJ SOLN
INTRAMUSCULAR | Status: AC
  Filled 2011-03-03: qty 60

## 2011-03-03 MED ORDER — ACETAMINOPHEN 325 MG PO TABS: 325.0000 mg | ORAL_TABLET | ORAL | Status: DC | PRN

## 2011-03-03 NOTE — Interval H&P Note (Signed)
History and Physical Interval Note:  03/03/2011 1:28 PM  Dale Torres  has presented today for surgery, with the diagnosis of sick sinus syndrome and pacemaker at [facility].  The various methods of treatment have been discussed with the patient and family. After consideration of risks, benefits and other options for treatment, the patient has consented to  Procedure(s) (LRB): PERMANENT PACEMAKER GENERATOR CHANGE (N/A) as a surgical intervention .  The patients' history has been reviewed, patient examined, no change in status, stable for surgery.  I have reviewed the patients' chart and labs.  Questions were answered to the patient's satisfaction.     Thompson Grayer

## 2011-03-03 NOTE — H&P (View-Only) (Signed)
PCP:  Monico Blitz, MD, MD  The patient presents today for routine electrophysiology followup.  Since last being seen in our clinic, the patient reports doing reasonably well.  He reports abrupt decrease in exercise tolerance over the past week.  He has not felt like leaving the house.  Today, he denies symptoms of palpitations, chest pain, shortness of breath,lower extremity edema, dizziness, presyncope, syncope, or neurologic sequela.  The patient feels that he is tolerating medications without difficulties and is otherwise without complaint today.   Past Medical History  Diagnosis Date  . Sick sinus syndrome     Status post pacemaker placement 2008  . Paroxysmal atrial fibrillation     declines coumadin  . Essential hypertension, benign   . Benign prostatic hypertrophy   . Renal insufficiency   . Coronary atherosclerosis of native coronary artery     Nonobstructive 2009  . Anemia     Dr. Sonny Dandy   Past Surgical History  Procedure Date  . Laminectomy   . Appendectomy   . Cholecystectomy   . Eye surgery   . Right rotator cuff repair   . Pacemaker placement 2008    [employer] - Dr. Doreatha Lew    Current Outpatient Prescriptions  Medication Sig Dispense Refill  . ALPRAZolam (XANAX) 1 MG tablet Take 1 mg by mouth at bedtime as needed.        Marland Kitchen amiodarone (PACERONE) 200 MG tablet Take 200 mg by mouth daily.        Marland Kitchen amLODipine (NORVASC) 5 MG tablet Take 5 mg by mouth daily.        Marland Kitchen aspirin 81 MG tablet Take 81 mg by mouth daily.        Marland Kitchen atenolol (TENORMIN) 25 MG tablet Take 25 mg by mouth daily.        . folic acid (FOLVITE) 1 MG tablet Take 1 mg by mouth daily.        . furosemide (LASIX) 80 MG tablet Take 80 mg by mouth every morning. And 1/2 by mouth in evening       . gabapentin (NEURONTIN) 400 MG capsule Take 400 mg by mouth daily.        Marland Kitchen HYDROcodone-acetaminophen (NORCO) 10-325 MG per tablet Take 1 tablet by mouth every 6 (six) hours as needed.        Marland Kitchen levothyroxine  (SYNTHROID, LEVOTHROID) 100 MCG tablet Take 100 mcg by mouth daily.        Marland Kitchen lisinopril (PRINIVIL,ZESTRIL) 10 MG tablet Take 10 mg by mouth daily.      . Multiple Vitamin (MULTIVITAMIN) tablet Take 1 tablet by mouth daily.        Marland Kitchen omeprazole (PRILOSEC) 20 MG capsule Take 20 mg by mouth daily.        . Tamsulosin HCl (FLOMAX) 0.4 MG CAPS Take 0.4 mg by mouth daily.          Allergies  Allergen Reactions  . Oxycodone Hcl     History   Social History  . Marital Status: Widowed    Spouse Name: N/A    Number of Children: N/A  . Years of Education: N/A   Occupational History  . RETIRED    Social History Main Topics  . Smoking status: Former Smoker -- 0.8 packs/day for 12 years    Types: Cigarettes    Quit date: 01/20/1960  . Smokeless tobacco: Former Systems developer    Types: Chew    Quit date: 01/20/1980   Comment: chewed tobacco x's 10  years while playing golf only,used a pack per week  . Alcohol Use: Yes     DRINKS 3-4 OZS OF SCOTCH PER WEEK  . Drug Use: No  . Sexually Active: Not on file   Other Topics Concern  . Not on file   Social History Narrative  . No narrative on file    Family History  Problem Relation Age of Onset  . Coronary artery disease Father   . Coronary artery disease Mother   . Coronary artery disease Sister     ROS-  All systems are reviewed and are negative except as outlined in the HPI above   Physical Exam: Filed Vitals:   02/27/11 0855  BP: 130/82  Pulse: 74  Height: 5\' 8"  (1.727 m)  Weight: 209 lb (94.802 kg)  SpO2: 90%    GEN- The patient is elderly appearing, alert and oriented x 3 today.   Head- normocephalic, atraumatic Eyes-  Sclera clear, conjunctiva pink Ears- hearing aides in place Oropharynx- clear Neck- supple, no JVP Lymph- no cervical lymphadenopathy Lungs- Clear to ausculation bilaterally, normal work of breathing Chest- R sided pacemaker pocket is well healed Heart- Regular rate and rhythm, no murmurs, rubs or gallops,  PMI not laterally displaced GI- soft, NT, ND, + BS Extremities- no clubbing, cyanosis, or edema  Pacemaker interrogation- reviewed in detail today,  See PACEART report  Assessment and Plan:

## 2011-03-03 NOTE — Discharge Instructions (Signed)
Pacemaker Battery Change A pacemaker battery usually lasts 4 to 12 years. Once or twice per year, you will be asked to visit your caregiver to have a full evaluation of your pacemaker. When a battery needs to be replaced, the entire pacemaker is actually replaced so that you can benefit from new circuitry and any new features that have recently been added to pacemakers. Most often, this procedure is very simple because the leads are already in place. After giving medicine to numb the skin, your health care provider makes a cut to reopen the pocket holding the pacemaker and disconnects the old device from its leads. The leads are routinely tested at this time. If they are working okay, the new pacemaker may simply be connected to the existing leads. If there is any problem with the old lead system, it may be wise to replace the lead system while inserting the new pacemaker. There are many things that affect how long a pacemaker battery will last:  Age of the pacemaker.   Number of leads (1, 2 or 3).   Pacemaker work load. If the pacemaker is helping the heart more often, then the battery will not last as long as if the pacemaker does not need to help the heart.   Resistance of the leads. The greater the resistance, the greater the drain on the battery. This can increase as the leads get older or if one or more of the leads does not have the best contact with the heart.   Power (voltage) settings.   The health of the person's heart. If the health of the heart gets worse, then the pacemaker may have to work more often and the setting changed to accommodate these changes.  Your health care provider will be alerted to the fact that it is time to replace the battery during follow-up exams. He or she will check your pacemaker using a small table-top computer, called a programmer, and a wand. The wand is about the same size as a remote control. Your provider puts the wand on your body in the area where the  pacemaker is located. Information from the pacemaker is received about how well your heart is working and the status of the battery. It is not painful, and it usually takes just a few minutes. You will have plenty of time before the battery is fully used up to plan for replacement.  LET YOUR CAREGIVER KNOW ABOUT:   Symptoms of chest pain, trouble breathing, palpitations, lightheadedness, or feelings of an abnormal or irregular heart beat.   Allergies.   Medications taken including herbs, eye drops, over the counter medications, and creams   Use of steroids (by mouth or creams).   Possible pregnancy, if applicable.   Previous problems with anesthetics or Novocaine.   History of blood clots (thrombophlebitis).   History of bleeding or blood problems.   Surgery since your last pacemaker placement.   Other health problems.  RISKS AND COMPLICATIONS These are very uncommon but include:  Bleeding.   Bruising of the skin around where the incision was made.   Pain at the site of the incision.   Pulling apart of the skin at the incision site.   Infection.   Allergic reaction to anesthetics or medicines used during the procedure.  Diabetics may have a temporary increase in their blood sugar after any surgical procedure.  BEFORE THE PROCEDURE  Wash all of the skin around the area of the chest where the pacemaker is located.   Try to remove any loose, scaling skin. Unless advised otherwise, avoid using aspirin, ibuprofen, or naproxen for 3-4 days before the procedure. Ask your caregiver for help with any other medication adjustments before the pacemaker is replaced. Unless advised otherwise, do not eat or drink after midnight on the night before the procedure EXCEPT for drinking water and taking your medications as you normally would. AFTER THE PROCEDURE   A heart monitor and the pacemaker programmer will be used to make sure that the new pacemaker is working properly.   You can go home  after the procedure.   Your caregiver will advise you if you need to have any stitches. They will be removed 5-7 days after the procedure.  HOME CARE INSTRUCTIONS   Keep the incision clean and dry.   Unless advised otherwise, you may shower after carefully covering the incision with plastic wrap that is taped to your chest.   For the first week after the replacement, avoid stretching motions that pull at the incision site and avoid heavy exercise with the arm on the same side as the incision.   Only take over-the-counter or prescription medicines for pain, discomfort, or fever as directed by your caregiver.   Your caregiver will tell you when you will need to next test your pacemaker by telephone or when to return to the office for re-exam and/or removal of stitches, if necessary.  SEEK MEDICAL CARE IF:   You have unusual pain at the incision site that is not adequately helped by over-the-counter or prescription medicine.   There is drainage or pus from the incision site.   You develop red streaking that extends above or below the incision site.   You feel brief intermittent palpitations, lightheadedness or any symptoms that you feel might be related to your heart.  SEEK IMMEDIATE MEDICAL CARE IF:   You experience chest pain that is different than the pain at the incision site.   You experience:   Shortness of breath.   Palpitations.   Irregular heart beat.   Lightheadedness that does not go away quickly.   Fainting.   You develop a fever.   You have pain that gets worse even though you are taking pain medicine.  MAKE SURE YOU:   Understand these instructions.   Will watch your condition.   Will get help right away if you are not doing well or get worse.  Document Released: 04/15/2006 Document Revised: 09/17/2010 Document Reviewed: 07/19/2006 [facility] Patient Information 2012 [place].

## 2011-03-03 NOTE — Op Note (Signed)
SURGEON:  Thompson Grayer, MD     PREPROCEDURE DIAGNOSES:   1. Sick sinus syndrome.   2. Paroxsymal Atrial fibrillation.     POSTPROCEDURE DIAGNOSES:   1. Sick sinus syndrome.   2. Paroxsymal Atrial fibrillation.     PROCEDURES:   1. Pacemaker pulse generator replacement.   2. Skin pocket revision.   3. Lead repair (atrial lead)    INTRODUCTION:  Dale Torres is a 78 y.o. male with a history of sick sinus syndrome. He has done well since his pacemaker was implanted.  He has recently reached ERI battery status.  He presents today for pacemaker pulse generator replacement.       DESCRIPTION OF THE PROCEDURE:  Informed written consent was obtained, and the patient was brought to the electrophysiology lab in the fasting state.  The patient's pacemaker was interrogated today and found to be at elective replacement indicator battery status.  The patient required no sedation or contrast for the procedure today.  The patient's right chest was prepped and draped in the usual sterile fashion by the EP lab staff.  The skin overlying the existing pacemaker was infiltrated with lidocaine for local analgesia.  A 4-cm incision was made over the pacemaker pocket.  Using a combination of sharp and blunt dissection, the pacemaker was exposed and removed from the body.  The device was disconnected from the leads. A single silk suture was identified and removed which had secured the device to the pectoralis fascia.  There was no foreign matter or debris within the pocket.  The atrial lead was confirmed to be a Guidant model [id number] (serial number [id number]) lead implanted on 06/23/06.  The right ventricular lead was confirmed to be a Guidant model 4470 (serial number [id number]) lead implanted on the same date as the atrial lead (above).  Both leads were examined.  There was a small area of insulation breach on the atrial lead, though impedance of this lead was normal.  I therefore elected to repair this lead.  Copious silicon was  placed around the tiny portion of breached insulation, approximately 6cm from the lead electrode.  The integrity of the ventricular lead was in tact.  Atrial lead P-waves measured 2.2 mV with impedance of 390 ohms and a threshold of 0.3 V at 0.5 msec.  Right ventricular lead R-waves measured 13.3 mV with impedance of 436 ohms and a threshold of 0.2V at 0.5 msec.  Both leads were connected to a Medtronic adapt L model ADDDR 1 (serial number [id number]) pacemaker.  The pocket was revised to accommodate this new device.  Electrocautery was required to assure hemostasis.  The pocket was irrigated with copious gentamicin solution. The pacemaker was then placed into the pocket.  The pocket was then closed in 2 layers with 2-0 Vicryl suture over the subcutaneous and subcuticular layers.  Steri-Strips and a sterile dressing were then applied.  There were no early apparent complications.     CONCLUSIONS:   1. Successful pacemaker pulse generator replacement for elective replacement indicator battery status   2. No early apparent complications.     Thompson Grayer, MD 03/03/2011 4:49 PM

## 2011-03-03 NOTE — Telephone Encounter (Addendum)
Left message to call back on voicemail. Labs faxed to Dr. Manuella Ghazi.

## 2011-03-03 NOTE — Telephone Encounter (Signed)
Message copied by Marcille Buffy on Tue Mar 03, 2011  2:40 PM ------      Message from: Thompson Grayer      Created: Mon Mar 02, 2011 10:08 PM       Results reviewed.  Please inform pt of result.      Though he carries a diagnosis of renal failure, I do not have any recent labs to compare to .  His creatinine is quite elevated.      Please forward results to PCP.

## 2011-03-05 NOTE — Telephone Encounter (Signed)
Pt notified of results and verbalized understanding  

## 2011-03-11 ENCOUNTER — Ambulatory Visit (INDEPENDENT_AMBULATORY_CARE_PROVIDER_SITE_OTHER): Payer: Medicare Other | Admitting: *Deleted

## 2011-03-11 ENCOUNTER — Encounter: Payer: Self-pay | Admitting: Internal Medicine

## 2011-03-11 DIAGNOSIS — I495 Sick sinus syndrome: Secondary | ICD-10-CM

## 2011-03-11 LAB — PACEMAKER DEVICE OBSERVATION
AL IMPEDENCE PM: 424 Ohm
ATRIAL PACING PM: 100
BAMS-0001: 150 {beats}/min
BATTERY VOLTAGE: 2.8 V

## 2011-03-12 LAB — BASIC METABOLIC PANEL
BUN: 40 mg/dL — ABNORMAL HIGH (ref 6–23)
CO2: 27 mEq/L (ref 19–32)
Chloride: 99 mEq/L (ref 96–112)
Glucose, Bld: 91 mg/dL (ref 70–99)
Potassium: 4 mEq/L (ref 3.5–5.1)

## 2011-03-18 ENCOUNTER — Telehealth: Payer: Self-pay | Admitting: *Deleted

## 2011-03-18 NOTE — Telephone Encounter (Signed)
Notes Recorded by Coralyn Mark, MD on 03/16/2011 at 9:18 AM Results reviewed. Please inform pt of abnormal renal result. He should follow-up wth his PCP

## 2011-03-18 NOTE — Telephone Encounter (Signed)
Left message to call back on voicemail regarding results.  

## 2011-03-19 NOTE — Telephone Encounter (Signed)
Pt notified of results and verbalized understanding. Results will be sent to Dr. Manuella Ghazi for further review.

## 2011-05-04 ENCOUNTER — Encounter: Payer: Self-pay | Admitting: Nephrology

## 2011-05-25 ENCOUNTER — Other Ambulatory Visit: Payer: Self-pay | Admitting: Nephrology

## 2011-06-24 ENCOUNTER — Encounter: Payer: Self-pay | Admitting: Internal Medicine

## 2011-06-24 ENCOUNTER — Ambulatory Visit (INDEPENDENT_AMBULATORY_CARE_PROVIDER_SITE_OTHER): Payer: Medicare Other | Admitting: Internal Medicine

## 2011-06-24 VITALS — BP 151/78 | HR 78 | Ht 70.0 in | Wt 207.0 lb

## 2011-06-24 DIAGNOSIS — I4891 Unspecified atrial fibrillation: Secondary | ICD-10-CM

## 2011-06-24 DIAGNOSIS — I495 Sick sinus syndrome: Secondary | ICD-10-CM

## 2011-06-24 DIAGNOSIS — I2510 Atherosclerotic heart disease of native coronary artery without angina pectoris: Secondary | ICD-10-CM

## 2011-06-24 DIAGNOSIS — I10 Essential (primary) hypertension: Secondary | ICD-10-CM

## 2011-06-24 LAB — PACEMAKER DEVICE OBSERVATION
AL AMPLITUDE: 1.4 mv
AL IMPEDENCE PM: 442 Ohm
AL THRESHOLD: 0.625 V
BAMS-0001: 150 {beats}/min
RV LEAD AMPLITUDE: 22.4 mv

## 2011-06-24 MED ORDER — NITROGLYCERIN 0.4 MG SL SUBL: 0.4000 mg | SUBLINGUAL_TABLET | SUBLINGUAL | Status: DC | PRN

## 2011-06-24 MED ORDER — ATENOLOL 50 MG PO TABS: 50.0000 mg | ORAL_TABLET | Freq: Every day | ORAL | Status: DC

## 2011-06-24 NOTE — Progress Notes (Signed)
PCP: Monico Blitz, MD, MD Primary Cardiologist:  Dr Lossie Faes is a 78 y.o. male who presents today for routine electrophysiology followup.  Since his recent generator change, the patient reports doing reasonably well.  He is primarily limited by arthritis.  He has noticed occasional exertional chest discomfort.  This was most noticeable last week after taking his garbage can to the curb.  He denies pain at rest and finds that it quickly abates upon cessation of activity. Today, he denies symptoms of palpitations, chest pain, shortness of breath,  lower extremity edema, dizziness, presyncope, or syncope.  The patient is otherwise without complaint today.   Past Medical History  Diagnosis Date  . Sick sinus syndrome     Status post pacemaker placement 2008  . Paroxysmal atrial fibrillation     declines coumadin  . Essential hypertension, benign   . Benign prostatic hypertrophy   . Renal insufficiency   . Coronary atherosclerosis of native coronary artery     Nonobstructive 2009  . Anemia     Dr. Sonny Dandy   Past Surgical History  Procedure Date  . Laminectomy   . Appendectomy   . Cholecystectomy   . Eye surgery   . Right rotator cuff repair   . Pacemaker placement 2008    Medtronic - Dr. Doreatha Lew  . Pacemaker generator change 03/03/11    MDT Adaptal L implanted by Dr Rayann Heman    Current Outpatient Prescriptions  Medication Sig Dispense Refill  . ALPRAZolam (XANAX) 1 MG tablet Take 1 mg by mouth at bedtime as needed. For sleep      . amiodarone (PACERONE) 200 MG tablet Take 200 mg by mouth daily.        Marland Kitchen aspirin 81 MG tablet Take 81 mg by mouth daily.        Marland Kitchen atenolol (TENORMIN) 50 MG tablet Take 1 tablet (50 mg total) by mouth daily.  30 tablet  6  . folic acid (FOLVITE) 1 MG tablet Take 1 mg by mouth daily.        . furosemide (LASIX) 80 MG tablet Take 40-80 mg by mouth 2 (two) times daily. 80mg  every am 40mg  every pm      . gabapentin (NEURONTIN) 400 MG capsule Take  400 mg by mouth daily.        Marland Kitchen HYDROcodone-acetaminophen (NORCO) 10-325 MG per tablet Take 1 tablet by mouth every 6 (six) hours as needed. For pain      . levothyroxine (SYNTHROID, LEVOTHROID) 112 MCG tablet Take 112 mcg by mouth daily.      . Multiple Vitamin (MULTIVITAMIN) tablet Take 1 tablet by mouth daily.        . nitroGLYCERIN (NITROSTAT) 0.4 MG SL tablet Place 1 tablet (0.4 mg total) under the tongue every 5 (five) minutes as needed.  25 tablet  2  . omeprazole (PRILOSEC) 20 MG capsule Take 20 mg by mouth daily.        . potassium chloride SA (K-DUR,KLOR-CON) 20 MEQ tablet Take 20 mEq by mouth daily.      . Tamsulosin HCl (FLOMAX) 0.4 MG CAPS Take 0.4 mg by mouth daily.        Marland Kitchen DISCONTD: nitroGLYCERIN (NITROSTAT) 0.4 MG SL tablet Place 0.4 mg under the tongue every 5 (five) minutes as needed.        Physical Exam: Filed Vitals:   06/24/11 1142  BP: 151/78  Pulse: 78  Height: 5\' 10"  (1.778 m)  Weight: 207 lb (93.895  kg)    GEN- The patient is well appearing, alert and oriented x 3 today.   Head- normocephalic, atraumatic Eyes-  Sclera clear, conjunctiva pink Ears- hearing intact Oropharynx- clear Lungs- Clear to ausculation bilaterally, normal work of breathing Chest- pacemaker pocket is well healed Heart- Regular rate and rhythm, no murmurs, rubs or gallops, PMI not laterally displaced GI- soft, NT, ND, + BS Extremities- no clubbing, cyanosis, or edema  Pacemaker interrogation- reviewed in detail today,  See PACEART report  Assessment and Plan:

## 2011-06-24 NOTE — Assessment & Plan Note (Signed)
Stable No change required today  

## 2011-06-24 NOTE — Assessment & Plan Note (Signed)
In sinus today His CHADS2 score is at least 2 (age, HTN).  He should be either on coumadin or a  Novel anticoagulant.  He is clear in his decision to decline these medicines at this time.

## 2011-06-24 NOTE — Assessment & Plan Note (Signed)
He has occasional exertional discomfort. I will increase atenolol to 50mg  daily today. I have also given slNTG script with instructions for use and to call 911 for persistent pain.  Given his advanced age I think that conservative treatment should be pursued initially.  He has scheduled follow-up with Dr Domenic Polite next week who can further adjust medicines as needed or consider stress testing if symptoms persists.

## 2011-06-24 NOTE — Assessment & Plan Note (Signed)
Normal pacemaker function See Pace Art report No changes today  

## 2011-06-24 NOTE — Patient Instructions (Addendum)
Your physician recommends that you schedule a follow-up appointment in: February 2013. You will receive a reminder letter in the mail in about 1-2  Month advance reminding you to call and schedule your appointment. If you don't receive this letter, please contact our office.   Your physician has recommended you make the following change in your medication: INCREASE ATENOLOL TO 50 MG DAILY. You may take (2) of your 25 mg tablets until they are finished. Your new prescription has been sent to your pharmacy. Start Nitroglycerin 0.4 mg . This has also been sent to your pharmacy.

## 2011-07-03 ENCOUNTER — Ambulatory Visit: Payer: Medicare Other | Admitting: Cardiology

## 2011-07-09 ENCOUNTER — Encounter: Payer: Self-pay | Admitting: Cardiology

## 2011-08-13 ENCOUNTER — Ambulatory Visit: Payer: Medicare Other | Admitting: Cardiology

## 2011-09-08 ENCOUNTER — Ambulatory Visit (INDEPENDENT_AMBULATORY_CARE_PROVIDER_SITE_OTHER): Payer: Medicare Other | Admitting: Cardiology

## 2011-09-08 ENCOUNTER — Other Ambulatory Visit: Payer: Self-pay | Admitting: Cardiology

## 2011-09-08 ENCOUNTER — Encounter: Payer: Self-pay | Admitting: Cardiology

## 2011-09-08 VITALS — BP 138/79 | HR 63 | Ht 70.0 in | Wt 197.8 lb

## 2011-09-08 DIAGNOSIS — R0602 Shortness of breath: Secondary | ICD-10-CM

## 2011-09-08 DIAGNOSIS — Z5181 Encounter for therapeutic drug level monitoring: Secondary | ICD-10-CM

## 2011-09-08 DIAGNOSIS — I4891 Unspecified atrial fibrillation: Secondary | ICD-10-CM

## 2011-09-08 DIAGNOSIS — Z79899 Other long term (current) drug therapy: Secondary | ICD-10-CM

## 2011-09-08 LAB — PULMONARY FUNCTION TEST

## 2011-09-08 MED ORDER — ATENOLOL 50 MG PO TABS: 50.0000 mg | ORAL_TABLET | Freq: Every day | ORAL | Status: DC

## 2011-09-08 NOTE — Progress Notes (Signed)
Clinical Summary Dale Torres is a 78 y.o.male presenting for followup. I saw him back in December 2012, and he has had interval followup with Dr. Rayann Heman. At that time atenolol was increased for possible antianginal effect. [name] states he has had somewhat progressive dyspnea on exertion, occasional chest tightness. He has not required any nitroglycerin however. His history includes nonobstructive CAD as well as renal insufficiency.  Lab work from May per Dr. Lowanda Foster showed BUN 35, creatinine 2.3, potassium 3.8.  ECG today shows an atrial paced rhythm. He denies any palpitations. As noted previously, he has not wanted to pursue anticoagulant therapy for atrial fibrillation. He has been on amiodarone long-term. No recent followup lab work chest x-ray noted.   Allergies  Allergen Reactions  . Oxycodone Hcl Rash    Current Outpatient Prescriptions  Medication Sig Dispense Refill  . ALPRAZolam (XANAX) 1 MG tablet Take 1 mg by mouth at bedtime as needed. For sleep      . amiodarone (PACERONE) 200 MG tablet Take 200 mg by mouth daily.        Marland Kitchen aspirin 81 MG tablet Take 81 mg by mouth daily.        Marland Kitchen atenolol (TENORMIN) 50 MG tablet Take 1 tablet (50 mg total) by mouth daily.  90 tablet  3  . folic acid (FOLVITE) 1 MG tablet Take 1 mg by mouth daily.        . furosemide (LASIX) 80 MG tablet Take 40-80 mg by mouth 2 (two) times daily. 80mg  every am 40mg  every pm      . gabapentin (NEURONTIN) 400 MG capsule Take 400 mg by mouth daily.        Marland Kitchen HYDROcodone-acetaminophen (NORCO) 10-325 MG per tablet Take 1 tablet by mouth every 6 (six) hours as needed. For pain      . levothyroxine (SYNTHROID, LEVOTHROID) 112 MCG tablet Take 112 mcg by mouth daily.      . Multiple Vitamin (MULTIVITAMIN) tablet Take 1 tablet by mouth daily.        . nitroGLYCERIN (NITROSTAT) 0.4 MG SL tablet Place 1 tablet (0.4 mg total) under the tongue every 5 (five) minutes as needed.  25 tablet  2  . omeprazole (PRILOSEC)  20 MG capsule Take 20 mg by mouth daily.        . potassium chloride SA (K-DUR,KLOR-CON) 20 MEQ tablet Take 20 mEq by mouth daily.      . Tamsulosin HCl (FLOMAX) 0.4 MG CAPS Take 0.4 mg by mouth daily.          Past Medical History  Diagnosis Date  . Sick sinus syndrome     Status post pacemaker placement 2008  . Paroxysmal atrial fibrillation     declines coumadin  . Essential hypertension, benign   . Benign prostatic hypertrophy   . Renal insufficiency   . Coronary atherosclerosis of native coronary artery     Nonobstructive 2009  . Anemia     Dr. Sonny Dandy    Social History [name] reports that he quit smoking about 51 years ago. His smoking use included Cigarettes. He has a 9.6 pack-year smoking history. He quit smokeless tobacco use about 31 years ago. His smokeless tobacco use included Chew. Mr. Marcellus reports that he drinks alcohol.  Review of Systems Negative except as outlined above.  Physical Examination Filed Vitals:   09/08/11 1415  BP: 138/79  Pulse: 63    Overweight elderly male in no acute distress.  HEENT: Conjunctiva  and lids normal, oropharynx with moist mucosa.  Neck: Supple, no elevated JVP or carotid bruits, no thyromegaly or thyroid tenderness.  Lungs: Decreased breath sounds but clear, nonlabored.  Cardiac: Paradoxically split S2, soft systolic murmur, no S3.  Abdomen: Soft, nontender, bowel sounds present.  Extremities: No pitting edema, distal pulses full.  Skin: Warm and dry. Musculoskeletal: No kyphosis. Neuropsychiatric: Alert and oriented x3, affect appropriate.    Problem List and Plan   Shortness of breath Also some intermittent chest tightness. Could be anginal, although had previously documented nonobstructive CAD. We discussed the possibility that things could have progressed over time, however his renal insufficiency increases his risk of contrast nephropathy with invasive cardiac testing and revascularization. We therefore decided to  continue with medical therapy and observation for now. Pursuing a stress test would probably not change this approach at the current time. Agree with increasing atenolol. He has not needed any nitroglycerin. We can always consider a long-acting nitrate next.  Atrial fibrillation He has been on amiodarone long-term. Has declined anticoagulant therapy. Will obtain TSH, LFTs, also chest x-ray and PFTs particularly in light of his shortness of breath.    Satira Sark, M.D., F.A.C.C.

## 2011-09-08 NOTE — Assessment & Plan Note (Signed)
Also some intermittent chest tightness. Could be anginal, although had previously documented nonobstructive CAD. We discussed the possibility that things could have progressed over time, however his renal insufficiency increases his risk of contrast nephropathy with invasive cardiac testing and revascularization. We therefore decided to continue with medical therapy and observation for now. Pursuing a stress test would probably not change this approach at the current time. Agree with increasing atenolol. He has not needed any nitroglycerin. We can always consider a long-acting nitrate next.

## 2011-09-08 NOTE — Assessment & Plan Note (Signed)
He has been on amiodarone long-term. Has declined anticoagulant therapy. Will obtain TSH, LFTs, also chest x-ray and PFTs particularly in light of his shortness of breath.

## 2011-09-08 NOTE — Patient Instructions (Addendum)
Your physician recommends that you schedule a follow-up appointment in: 4 months. You will receive a reminder letter in the mail in about 2 months reminding you to call and schedule your appointment. If you don't receive this letter, please contact our office.  Your physician recommends that you continue on your current medications as directed. Please refer to the Current Medication list given to you today. Refill sent today for atenolol to [facility].  A chest x-ray takes a picture of the organs and structures inside the chest, including the heart, lungs, and blood vessels. This test can show several things, including, whether the heart is enlarges; whether fluid is building up in the lungs; and whether pacemaker / defibrillator leads are still in place. Have this done today at [facility].  Your physician has recommended that you have a pulmonary function test. Pulmonary Function Tests are a group of tests that measure how well air moves in and out of your lungs. Your physician recommends that you return for lab work next week at Dr. Rhona Leavens office with your other labs. Please make sure they send results to our office.

## 2011-09-11 ENCOUNTER — Telehealth: Payer: Self-pay | Admitting: *Deleted

## 2011-09-11 NOTE — Telephone Encounter (Signed)
Message copied by Merlene Laughter on Fri Sep 11, 2011  3:20 PM ------      Message from: MCDOWELL, Aloha Gell      Created: Thu Sep 10, 2011  3:39 PM       Reviewed report. No infiltrates or edema.

## 2011-09-11 NOTE — Telephone Encounter (Signed)
Patient informed via message machine. 

## 2011-09-14 ENCOUNTER — Other Ambulatory Visit: Payer: Self-pay | Admitting: Cardiology

## 2011-09-16 ENCOUNTER — Telehealth: Payer: Self-pay | Admitting: *Deleted

## 2011-09-16 NOTE — Telephone Encounter (Signed)
Message copied by Merlene Laughter on Wed Sep 16, 2011  3:16 PM ------      Message from: Satira Sark      Created: Tue Sep 15, 2011  1:45 PM       Creatinine 2.4, relatively stable and followed by Dr. Lowanda Foster. Liver tests are normal as is TSH.

## 2011-09-16 NOTE — Telephone Encounter (Signed)
Patient informed. 

## 2011-10-09 ENCOUNTER — Telehealth: Payer: Self-pay | Admitting: *Deleted

## 2011-10-09 NOTE — Telephone Encounter (Signed)
Message copied by Merlene Laughter on Fri Oct 09, 2011  4:03 PM ------      Message from: Satira Sark      Created: Fri Oct 09, 2011 10:39 AM       Reviewed report. Main component to note is the normal DLCO on Amiodarone. Continue same.

## 2011-10-09 NOTE — Telephone Encounter (Signed)
Patient informed. 

## 2012-02-29 ENCOUNTER — Encounter: Payer: Self-pay | Admitting: Internal Medicine

## 2012-02-29 ENCOUNTER — Ambulatory Visit (INDEPENDENT_AMBULATORY_CARE_PROVIDER_SITE_OTHER): Payer: Medicare Other | Admitting: Internal Medicine

## 2012-02-29 VITALS — BP 151/89 | HR 86 | Ht 70.0 in | Wt 201.0 lb

## 2012-02-29 DIAGNOSIS — I495 Sick sinus syndrome: Secondary | ICD-10-CM

## 2012-02-29 DIAGNOSIS — I4891 Unspecified atrial fibrillation: Secondary | ICD-10-CM

## 2012-02-29 LAB — PACEMAKER DEVICE OBSERVATION
AL AMPLITUDE: 1.4 mv
AL THRESHOLD: 0.5 V
BAMS-0001: 150 {beats}/min
RV LEAD AMPLITUDE: 8 mv
RV LEAD IMPEDENCE PM: 470 Ohm
RV LEAD THRESHOLD: 0.75 V
VENTRICULAR PACING PM: 0

## 2012-02-29 NOTE — Progress Notes (Signed)
PCP: Monico Blitz, MD Primary Cardiologist:  Dr Lossie Faes is a 79 y.o. male who presents today for routine electrophysiology followup.  He is primarily limited by arthritis.  His SOB is stable and chronic.  He has not had significant CP recently. Today, he denies symptoms of palpitations, chest pain, shortness of breath,  lower extremity edema, dizziness, presyncope, or syncope.  The patient is otherwise without complaint today.   Past Medical History  Diagnosis Date  . Sick sinus syndrome     Status post pacemaker placement 2008  . Paroxysmal atrial fibrillation     declines coumadin  . Essential hypertension, benign   . Benign prostatic hypertrophy   . Renal insufficiency   . Coronary atherosclerosis of native coronary artery     Nonobstructive 2009  . Anemia     Dr. Sonny Dandy   Past Surgical History  Procedure Laterality Date  . Laminectomy    . Appendectomy    . Cholecystectomy    . Eye surgery    . Right rotator cuff repair    . Pacemaker placement  2008    [employer] - Dr. Doreatha Lew  . Pacemaker generator change  03/03/11    MDT Adaptal L implanted by Dr Rayann Heman    Current Outpatient Prescriptions  Medication Sig Dispense Refill  . ALPRAZolam (XANAX) 1 MG tablet Take 1 mg by mouth at bedtime as needed. For sleep      . amiodarone (PACERONE) 200 MG tablet Take 200 mg by mouth daily.        Marland Kitchen aspirin 81 MG tablet Take 81 mg by mouth daily.        Marland Kitchen atenolol (TENORMIN) 50 MG tablet Take 1 tablet (50 mg total) by mouth daily.  90 tablet  3  . folic acid (FOLVITE) 1 MG tablet Take 1 mg by mouth daily.        . furosemide (LASIX) 80 MG tablet Take 40-80 mg by mouth 2 (two) times daily. 80mg  every am 40mg  every pm      . gabapentin (NEURONTIN) 400 MG capsule Take 400 mg by mouth daily.        Marland Kitchen HYDROcodone-acetaminophen (NORCO) 10-325 MG per tablet Take 1 tablet by mouth every 6 (six) hours as needed. For pain      . levothyroxine (SYNTHROID, LEVOTHROID) 112 MCG tablet  Take 112 mcg by mouth daily.      . Multiple Vitamin (MULTIVITAMIN) tablet Take 1 tablet by mouth daily.        . nitroGLYCERIN (NITROSTAT) 0.4 MG SL tablet Place 1 tablet (0.4 mg total) under the tongue every 5 (five) minutes as needed.  25 tablet  2  . omeprazole (PRILOSEC) 20 MG capsule Take 20 mg by mouth daily.        . Tamsulosin HCl (FLOMAX) 0.4 MG CAPS Take 0.4 mg by mouth daily.         No current facility-administered medications for this visit.    Physical Exam: Filed Vitals:   02/29/12 0940  BP: 151/89  Pulse: 86  Height: 5\' 10"  (1.778 m)  Weight: 201 lb (91.173 kg)  SpO2: 100%    GEN- The patient is well appearing, alert and oriented x 3 today.   Head- normocephalic, atraumatic Eyes-  Sclera clear, conjunctiva pink Ears- hearing intact Oropharynx- clear Lungs- Clear to ausculation bilaterally, normal work of breathing Chest- pacemaker pocket is well healed Heart- Regular rate and rhythm, no murmurs, rubs or gallops, PMI not laterally displaced  GI- soft, NT, ND, + BS Extremities- no clubbing, cyanosis, trace edema  Pacemaker interrogation- reviewed in detail today,  See PACEART report  Assessment and Plan:  1. Sick sinus syndrome Normal pacemaker function See Pace Art report No changes today carelink checks every 3 months, I will see in a year  2. afib Burden is very low (longest episode < 1 minute) He has declined anticoagulation  3. HTN Above goal today He reports BP at home is typically at goal No changes today

## 2012-02-29 NOTE — Patient Instructions (Signed)
   Carelink - 3 months (05/30/2012)  1 year - Dr. Rayann Heman Continue all current medications.

## 2012-03-21 ENCOUNTER — Encounter: Payer: Self-pay | Admitting: Internal Medicine

## 2012-05-30 ENCOUNTER — Encounter: Payer: Medicare Other | Admitting: *Deleted

## 2012-06-10 ENCOUNTER — Encounter: Payer: Self-pay | Admitting: *Deleted

## 2012-06-20 LAB — PULMONARY FUNCTION TEST

## 2012-06-21 ENCOUNTER — Encounter: Payer: Self-pay | Admitting: Physician Assistant

## 2012-06-21 ENCOUNTER — Other Ambulatory Visit: Payer: Self-pay | Admitting: *Deleted

## 2012-06-21 DIAGNOSIS — R079 Chest pain, unspecified: Secondary | ICD-10-CM

## 2012-06-21 DIAGNOSIS — I200 Unstable angina: Secondary | ICD-10-CM

## 2012-06-21 DIAGNOSIS — R0609 Other forms of dyspnea: Secondary | ICD-10-CM

## 2012-06-27 ENCOUNTER — Encounter: Payer: Self-pay | Admitting: Physician Assistant

## 2012-06-27 DIAGNOSIS — R079 Chest pain, unspecified: Secondary | ICD-10-CM

## 2012-06-29 ENCOUNTER — Other Ambulatory Visit: Payer: Self-pay

## 2012-06-29 ENCOUNTER — Other Ambulatory Visit (INDEPENDENT_AMBULATORY_CARE_PROVIDER_SITE_OTHER): Payer: Medicare Other

## 2012-06-29 DIAGNOSIS — R0609 Other forms of dyspnea: Secondary | ICD-10-CM

## 2012-06-29 DIAGNOSIS — R079 Chest pain, unspecified: Secondary | ICD-10-CM

## 2012-06-29 DIAGNOSIS — R0989 Other specified symptoms and signs involving the circulatory and respiratory systems: Secondary | ICD-10-CM

## 2012-07-01 ENCOUNTER — Telehealth: Payer: Self-pay | Admitting: *Deleted

## 2012-07-01 ENCOUNTER — Encounter: Payer: Self-pay | Admitting: *Deleted

## 2012-07-01 NOTE — Telephone Encounter (Signed)
Message copied by Merlene Laughter on Fri Jul 01, 2012 11:00 AM ------      Message from: MCDOWELL, Aloha Gell      Created: Wed Jun 29, 2012  1:03 PM       Reviewed. LVEF in similar range to recent Cardiolite study. He should already have an office followup visit scheduled to discuss. Preference would be to manage him medically from the perspective of ischemic heart disease in light of increased risk for contrast nephropathy with chronic kidney disease. ------

## 2012-07-01 NOTE — Telephone Encounter (Signed)
Patient informed. 

## 2012-07-08 ENCOUNTER — Encounter: Payer: Self-pay | Admitting: Physician Assistant

## 2012-07-08 ENCOUNTER — Ambulatory Visit (INDEPENDENT_AMBULATORY_CARE_PROVIDER_SITE_OTHER): Payer: Medicare Other | Admitting: Physician Assistant

## 2012-07-08 VITALS — BP 148/84 | HR 80 | Ht 70.0 in | Wt 201.8 lb

## 2012-07-08 DIAGNOSIS — R0602 Shortness of breath: Secondary | ICD-10-CM

## 2012-07-08 DIAGNOSIS — I2510 Atherosclerotic heart disease of native coronary artery without angina pectoris: Secondary | ICD-10-CM

## 2012-07-08 DIAGNOSIS — I4891 Unspecified atrial fibrillation: Secondary | ICD-10-CM

## 2012-07-08 DIAGNOSIS — I10 Essential (primary) hypertension: Secondary | ICD-10-CM

## 2012-07-08 DIAGNOSIS — E039 Hypothyroidism, unspecified: Secondary | ICD-10-CM

## 2012-07-08 NOTE — Progress Notes (Signed)
Primary Cardiologist: Dale Bridge, MD   HPI: Post hospital followup from [facility], status post recent presentation with UAP. Troponins NL. We recommended further outpatient evaluation with echocardiography and a nuclear imaging study. We also added amlodipine 5 mg daily. Plan was to pursue medical management, given his increased risk for CIN.   - Lexiscan Cardiolite, June 9: Low risk study with possible inferior scar/minor peri-infarct ischemia; EF 43%   - Echocardiogram, June 11: EF [id number]; grade 1 diastolic dysfunction; mild MR, TR, and PR  He presents today reporting no further CP. He continues to have chronic, stable DOE when walking up an incline, but otherwise denies PND, orthopnea, or worsening peripheral edema. He denies tachycardia palpitations.  Allergies  Allergen Reactions  . Oxycodone Hcl Rash    Current Outpatient Prescriptions  Medication Sig Dispense Refill  . ALPRAZolam (XANAX) 1 MG tablet Take 1 mg by mouth at bedtime as needed. For sleep      . amiodarone (PACERONE) 200 MG tablet Take 200 mg by mouth daily.        Marland Kitchen aspirin 81 MG tablet Take 81 mg by mouth daily.        Marland Kitchen atenolol (TENORMIN) 50 MG tablet Take 1 tablet (50 mg total) by mouth daily.  90 tablet  3  . folic acid (FOLVITE) 1 MG tablet Take 1 mg by mouth daily.        . furosemide (LASIX) 80 MG tablet Take 40-80 mg by mouth 2 (two) times daily. 80mg  every am 40mg  every pm      . gabapentin (NEURONTIN) 400 MG capsule Take 400 mg by mouth daily.        Marland Kitchen HYDROcodone-acetaminophen (NORCO) 10-325 MG per tablet Take 1 tablet by mouth every 6 (six) hours as needed. For pain      . levothyroxine (SYNTHROID, LEVOTHROID) 112 MCG tablet Take 112 mcg by mouth daily.      . Multiple Vitamin (MULTIVITAMIN) tablet Take 1 tablet by mouth daily.        . nitroGLYCERIN (NITROSTAT) 0.4 MG SL tablet Place 1 tablet (0.4 mg total) under the tongue every 5 (five) minutes as needed.  25 tablet  2  . omeprazole (PRILOSEC) 20 MG  capsule Take 20 mg by mouth daily.        . Potassium 99 MG TABS Take 1 tablet by mouth daily.      . Tamsulosin HCl (FLOMAX) 0.4 MG CAPS Take 0.4 mg by mouth daily.         No current facility-administered medications for this visit.    Past Medical History  Diagnosis Date  . Sick sinus syndrome     Status post pacemaker placement 2008  . Paroxysmal atrial fibrillation     declines coumadin  . Essential hypertension, benign   . Benign prostatic hypertrophy   . Renal insufficiency   . Coronary atherosclerosis of native coronary artery     Nonobstructive 2009  . Anemia     Dr. Sonny Dandy  . Hypothyroidism     Past Surgical History  Procedure Laterality Date  . Laminectomy    . Appendectomy    . Cholecystectomy    . Eye surgery    . Right rotator cuff repair    . Pacemaker placement  2008    [employer] - Dr. Doreatha Lew  . Pacemaker generator change  03/03/11    MDT Adaptal L implanted by Dr Rayann Heman    History   Social History  . Marital Status: Widowed  Spouse Name: N/A    Number of Children: N/A  . Years of Education: N/A   Occupational History  . RETIRED    Social History Main Topics  . Smoking status: Former Smoker -- 0.80 packs/day for 12 years    Types: Cigarettes    Quit date: 01/20/1960  . Smokeless tobacco: Former Systems developer    Types: Chew    Quit date: 01/20/1980     Comment: chewed tobacco x's 10 years while playing golf only,used a pack per week  . Alcohol Use: Yes     Comment: DRINKS 3-4 OZS OF SCOTCH PER WEEK  . Drug Use: No  . Sexually Active: Not on file   Other Topics Concern  . Not on file   Social History Narrative  . No narrative on file    Family History  Problem Relation Age of Onset  . Coronary artery disease Father   . Coronary artery disease Mother   . Coronary artery disease Sister     ROS: no nausea, vomiting; no fever, chills; no melena, hematochezia; no claudication  PHYSICAL EXAM: BP 148/84  Pulse 80  Ht 5\' 10"  (1.778 m)  Wt  201 lb 12.8 oz (91.536 kg)  BMI 28.96 kg/m2  SpO2 98% GENERAL: 79 year-old male; NAD HEENT: NCAT, PERRLA, EOMI; sclera clear; no xanthelasma NECK: palpable bilateral carotid pulses, no bruits; no JVD; no TM LUNGS: CTA bilaterally CARDIAC: RRR (S1, S2); no significant murmurs; no rubs or gallops ABDOMEN: soft, non-tender; intact BS EXTREMETIES: no significant peripheral edema SKIN: warm/dry; no obvious rash/lesions MUSCULOSKELETAL: no joint deformity NEURO: no focal deficit; NL affect   EKG:    ASSESSMENT & PLAN:  Coronary atherosclerosis of native coronary artery Results of recent low risk imaging study were reviewed. In the absence of recurrent CP, and given his increased risk of CIN, recommend continued medical management.   Essential hypertension, benign Stable following recent addition of amlodipine. Continue current dose for now and uptitrate to 10 mg daily, if BP becomes uncontrolled.  Atrial fibrillation Maintaining NSR by history and exam, and as evidenced by recent EKG during hospitalization. Continue current dose amiodarone. Patient on low-dose ASA, given history of declining Coumadin anticoagulation.  Shortness of breath Stable on current diuretic regimen. Weight unchanged since last OV. Patient has CKD, followed by Dr. Lowanda Foster.  Hypothyroidism On Synthroid, followed by primary M.D.    Gene Leoda Smithhart, PAC

## 2012-07-08 NOTE — Assessment & Plan Note (Signed)
On Synthroid, followed by primary M.D.

## 2012-07-08 NOTE — Assessment & Plan Note (Signed)
Stable following recent addition of amlodipine. Continue current dose for now and uptitrate to 10 mg daily, if BP becomes uncontrolled.

## 2012-07-08 NOTE — Assessment & Plan Note (Signed)
Stable on current diuretic regimen. Weight unchanged since last OV. Patient has CKD, followed by Dr. Lowanda Foster.

## 2012-07-08 NOTE — Assessment & Plan Note (Signed)
Maintaining NSR by history and exam, and as evidenced by recent EKG during hospitalization. Continue current dose amiodarone. Patient on low-dose ASA, given history of declining Coumadin anticoagulation.

## 2012-07-08 NOTE — Assessment & Plan Note (Signed)
Results of recent low risk imaging study were reviewed. In the absence of recurrent CP, and given his increased risk of CIN, recommend continued medical management.

## 2012-07-08 NOTE — Patient Instructions (Addendum)
Continue all current medications. Follow up in  2 months  

## 2012-08-18 ENCOUNTER — Encounter: Payer: Self-pay | Admitting: Nephrology

## 2012-09-13 ENCOUNTER — Ambulatory Visit (INDEPENDENT_AMBULATORY_CARE_PROVIDER_SITE_OTHER): Payer: Medicare Other | Admitting: Cardiology

## 2012-09-13 ENCOUNTER — Encounter: Payer: Self-pay | Admitting: Cardiology

## 2012-09-13 VITALS — BP 143/74 | HR 61 | Ht 70.0 in | Wt 193.0 lb

## 2012-09-13 DIAGNOSIS — I2510 Atherosclerotic heart disease of native coronary artery without angina pectoris: Secondary | ICD-10-CM

## 2012-09-13 DIAGNOSIS — I10 Essential (primary) hypertension: Secondary | ICD-10-CM

## 2012-09-13 DIAGNOSIS — I4891 Unspecified atrial fibrillation: Secondary | ICD-10-CM

## 2012-09-13 NOTE — Progress Notes (Signed)
Clinical Summary Mr. Marlett is an 79 y.o.male last seen in June by Mr. Serpe PA-C for review of cardiac testing. He had undergone a Lexiscan Cardiolite in June that was low risk with possible inferior scar and minor peri-infarct ischemia, LVEF 43%. Echocardiogram done at that time showed similar LVEF range with grade 1 diastolic dysfunction, mild mitral regurgitation , and mild tricuspid regurgitation. He has been treated medically..  Recent lab work from July showed BUN 34, creatinine 2.3, sodium 135, potassium 3.7, hemoglobin 11.7.  He denies any palpitations or chest pain. Mainly limited by arthritis pain. States that he has been compliant with his medications. Does have ankle edema, no progression.   Allergies  Allergen Reactions  . Oxycodone Hcl Rash    Current Outpatient Prescriptions  Medication Sig Dispense Refill  . ALPRAZolam (XANAX) 1 MG tablet Take 1 mg by mouth at bedtime as needed. For sleep      . amiodarone (PACERONE) 200 MG tablet Take 200 mg by mouth daily.        Marland Kitchen amLODipine (NORVASC) 5 MG tablet Take 5 mg by mouth daily.      Marland Kitchen aspirin 81 MG tablet Take 81 mg by mouth daily.        Marland Kitchen atenolol (TENORMIN) 50 MG tablet Take 1 tablet (50 mg total) by mouth daily.  90 tablet  3  . calcitRIOL (ROCALTROL) 0.25 MCG capsule Take 0.25 mcg by mouth daily.      Marland Kitchen docusate sodium (COLACE) 100 MG capsule Take 100 mg by mouth as needed for constipation.      . folic acid (FOLVITE) 1 MG tablet Take 1 mg by mouth daily.        . furosemide (LASIX) 80 MG tablet Take 40-80 mg by mouth 2 (two) times daily. 80mg  every am 40mg  every pm      . gabapentin (NEURONTIN) 400 MG capsule Take 400 mg by mouth daily.        Marland Kitchen HYDROcodone-acetaminophen (NORCO) 10-325 MG per tablet Take 1 tablet by mouth every 8 (eight) hours as needed for pain.      Marland Kitchen levothyroxine (SYNTHROID, LEVOTHROID) 112 MCG tablet Take 112 mcg by mouth daily.      . Multiple Vitamin (MULTIVITAMIN) tablet Take 1 tablet by  mouth daily.        . nitroGLYCERIN (NITROSTAT) 0.4 MG SL tablet Place 1 tablet (0.4 mg total) under the tongue every 5 (five) minutes as needed.  25 tablet  2  . omeprazole (PRILOSEC) 20 MG capsule Take 20 mg by mouth daily.        . Potassium 99 MG TABS Take 1 tablet by mouth daily.      . Tamsulosin HCl (FLOMAX) 0.4 MG CAPS Take 0.4 mg by mouth daily.        . vitamin C (ASCORBIC ACID) 500 MG tablet Take 500 mg by mouth daily.       No current facility-administered medications for this visit.    Past Medical History  Diagnosis Date  . Sick sinus syndrome     Status post pacemaker placement 2008  . Paroxysmal atrial fibrillation     Declines coumadin  . Essential hypertension, benign   . Benign prostatic hypertrophy   . Renal insufficiency   . Coronary atherosclerosis of native coronary artery     Nonobstructive 2009  . Anemia     Dr. Sonny Dandy  . Hypothyroidism     Social History Mr. Easterday reports that he quit  smoking about 52 years ago. His smoking use included Cigarettes. He has a 9.6 pack-year smoking history. He quit smokeless tobacco use about 32 years ago. His smokeless tobacco use included Chew. Mr. Hathcoat reports that  drinks alcohol.  Review of Systems As outlined above.  Physical Examination Filed Vitals:   09/13/12 1345  BP: 143/74  Pulse: 61   Filed Weights   09/13/12 1345  Weight: 193 lb (87.544 kg)    Appears comfortable at rest.  HEENT: Conjunctiva and lids normal, oropharynx with moist mucosa.  Neck: Supple, no elevated JVP or carotid bruits, no thyromegaly or thyroid tenderness.  Lungs: Decreased breath sounds but clear, nonlabored.  Cardiac: Paradoxically split S2, soft systolic murmur, no S3.  Abdomen: Soft, nontender, bowel sounds present.  Extremities: No pitting edema, distal pulses full.  Skin: Warm and dry.  Musculoskeletal: No kyphosis.  Neuropsychiatric: Alert and oriented x3, affect appropriate.   Problem List and Plan   Atrial  fibrillation No palpitations, plan to continue amiodarone, Tenormin and aspirin (he declines anticoagulation).  Coronary atherosclerosis of native coronary artery Nonobstructive disease at catheterization in 2009. Recent followup Cardiolite in June was low risk with possible inferior scar/minor peri-infarct ischemia, LVEF 43%. Plan is to continue medical therapy.  Essential hypertension, benign Continue current medications.    Satira Sark, M.D., F.A.C.C.

## 2012-09-13 NOTE — Patient Instructions (Signed)
Your physician recommends that you schedule a follow-up appointment in: 4 months you will schedule this today before you leave.  Your physician recommends that you continue on your current medications as directed. Please refer to the Current Medication list given to you today.

## 2012-09-13 NOTE — Assessment & Plan Note (Signed)
Nonobstructive disease at catheterization in 2009. Recent followup Cardiolite in June was low risk with possible inferior scar/minor peri-infarct ischemia, LVEF 43%. Plan is to continue medical therapy.

## 2012-09-13 NOTE — Assessment & Plan Note (Signed)
Continue current medications. 

## 2012-09-13 NOTE — Assessment & Plan Note (Signed)
No palpitations, plan to continue amiodarone, Tenormin and aspirin (he declines anticoagulation).

## 2012-11-17 ENCOUNTER — Encounter: Payer: Self-pay | Admitting: Cardiology

## 2013-01-06 ENCOUNTER — Encounter: Payer: Self-pay | Admitting: Cardiology

## 2013-01-06 ENCOUNTER — Ambulatory Visit (INDEPENDENT_AMBULATORY_CARE_PROVIDER_SITE_OTHER): Payer: Medicare Other | Admitting: Cardiology

## 2013-01-06 VITALS — BP 153/71 | HR 80 | Ht 70.0 in | Wt 202.8 lb

## 2013-01-06 DIAGNOSIS — N189 Chronic kidney disease, unspecified: Secondary | ICD-10-CM

## 2013-01-06 DIAGNOSIS — I495 Sick sinus syndrome: Secondary | ICD-10-CM

## 2013-01-06 DIAGNOSIS — I4891 Unspecified atrial fibrillation: Secondary | ICD-10-CM

## 2013-01-06 NOTE — Patient Instructions (Signed)
Continue all current medications. Your physician wants you to follow up in: 6 months.  You will receive a reminder letter in the mail one-two months in advance.  If you don't receive a letter, please call our office to schedule the follow up appointment   

## 2013-01-06 NOTE — Assessment & Plan Note (Addendum)
Continue present regimen including amiodarone and atenolol. He declines anticoagulation, will continue aspirin. Can keep follow with Dr. Manuella Ghazi for routine lab work, specifically CMET and TSH.

## 2013-01-06 NOTE — Progress Notes (Signed)
Clinical Summary Dale Torres is an 79 y.o.male last seen in August. He reports no palpitations, no falls. Still has to pace himself her typical activities. He reports compliance with his medications without obvious side effects.  Lab work from October showed BUN 43, creatinine 2.6, potassium 3.7, hemoglobin 11.2. He is followed by Dr. Lowanda Foster for chronic renal disease. Lab work otherwise followed by Dr. Manuella Ghazi.  Lexiscan Cardiolite in June of this year was low risk with possible inferior scar and minor peri-infarct ischemia, LVEF 43%. Echocardiogram at that time demonstrated similar LVEF with grade 1 diastolic dysfunction, mild mitral regurgitation, and mild tricuspid regurgitation.  He continues to decline anticoagulation options. He is on aspirin.  Pacer followup is with Dr. Rayann Heman.   Allergies  Allergen Reactions  . Oxycodone Hcl Rash    Current Outpatient Prescriptions  Medication Sig Dispense Refill  . amiodarone (PACERONE) 200 MG tablet Take 200 mg by mouth daily.        Marland Kitchen amLODipine (NORVASC) 5 MG tablet Take 5 mg by mouth daily.      Marland Kitchen aspirin 81 MG tablet Take 81 mg by mouth daily.        Marland Kitchen atenolol (TENORMIN) 50 MG tablet Take 1 tablet (50 mg total) by mouth daily.  90 tablet  3  . calcitRIOL (ROCALTROL) 0.5 MCG capsule Take 0.5 mcg by mouth daily.      Marland Kitchen docusate sodium (COLACE) 100 MG capsule Take 100 mg by mouth as needed for constipation.      . folic acid (FOLVITE) 1 MG tablet Take 1 mg by mouth daily.        . furosemide (LASIX) 80 MG tablet Take 40-80 mg by mouth 2 (two) times daily. 80mg  every am 40mg  every pm      . gabapentin (NEURONTIN) 400 MG capsule Take 400 mg by mouth daily.        Marland Kitchen HYDROcodone-acetaminophen (NORCO) 10-325 MG per tablet Take 1 tablet by mouth every 8 (eight) hours as needed for pain.      Marland Kitchen levothyroxine (SYNTHROID, LEVOTHROID) 112 MCG tablet Take 112 mcg by mouth daily.      . Multiple Vitamin (MULTIVITAMIN) tablet Take 1 tablet by mouth  daily.        . nitroGLYCERIN (NITROSTAT) 0.4 MG SL tablet Place 1 tablet (0.4 mg total) under the tongue every 5 (five) minutes as needed.  25 tablet  2  . omeprazole (PRILOSEC) 20 MG capsule Take 20 mg by mouth daily.        . Potassium 99 MG TABS Take 1 tablet by mouth daily.      . Tamsulosin HCl (FLOMAX) 0.4 MG CAPS Take 0.4 mg by mouth daily.         No current facility-administered medications for this visit.    Past Medical History  Diagnosis Date  . Sick sinus syndrome     Status post pacemaker placement 2008  . Paroxysmal atrial fibrillation     Declines coumadin  . Essential hypertension, benign   . Benign prostatic hypertrophy   . Renal insufficiency   . Coronary atherosclerosis of native coronary artery     Nonobstructive 2009  . Anemia     Dr. Sonny Dandy  . Hypothyroidism     Past Surgical History  Procedure Laterality Date  . Laminectomy    . Appendectomy    . Cholecystectomy    . Eye surgery    . Right rotator cuff repair    .  Pacemaker placement  2008    [employer] - Dr. Doreatha Lew  . Pacemaker generator change  03/03/11    MDT Adaptal L implanted by Dr Rayann Heman    Social History [name] reports that he quit smoking about 53 years ago. His smoking use included Cigarettes. He has a 9.6 pack-year smoking history. He quit smokeless tobacco use about 32 years ago. His smokeless tobacco use included Chew. [name] reports that he drinks alcohol.  Review of Systems Chronic leg edema, dependent. Stable appetite. No reported bleeding episodes.   Physical Examination Filed Vitals:   01/06/13 1055  BP: 153/71  Pulse: 80   Filed Weights   01/06/13 1055  Weight: 202 lb 12.8 oz (91.989 kg)    Appears comfortable at rest.  HEENT: Conjunctiva and lids normal, oropharynx with moist mucosa.  Neck: Supple, no elevated JVP or carotid bruits, no thyromegaly or thyroid tenderness.  Lungs: Decreased breath sounds but clear, nonlabored.  Cardiac: Paradoxically split S2,  soft systolic murmur, no S3.  Abdomen: Soft, nontender, bowel sounds present.  Extremities: No pitting edema, distal pulses full.  Skin: Warm and dry.  Musculoskeletal: No kyphosis.  Neuropsychiatric: Alert and oriented x3, affect appropriate.   Problem List and Plan   Atrial fibrillation Continue present regimen including amiodarone and atenolol. He declines anticoagulation, will continue aspirin. Can keep follow with Dr. Manuella Ghazi for routine lab work, specifically CMET and TSH.  SICK SINUS SYNDROME Status post [employer]tr[employer] pacemaker, followed by Dr. Rayann Heman.  Chronic renal insufficiency Most recent creatinine 2.6 in October, followed by Dr. Lowanda Foster.    Satira Sark, M.D., F.A.C.C.

## 2013-01-06 NOTE — Assessment & Plan Note (Signed)
Most recent creatinine 2.6 in October, followed by Dr. Lowanda Foster.

## 2013-01-06 NOTE — Assessment & Plan Note (Signed)
Status post [employer] pacemaker, followed by Dr. Allred. 

## 2013-02-16 ENCOUNTER — Encounter: Payer: Self-pay | Admitting: Internal Medicine

## 2013-03-06 ENCOUNTER — Encounter: Payer: Self-pay | Admitting: Internal Medicine

## 2013-03-06 ENCOUNTER — Ambulatory Visit (INDEPENDENT_AMBULATORY_CARE_PROVIDER_SITE_OTHER): Payer: Medicare Other | Admitting: Internal Medicine

## 2013-03-06 VITALS — BP 162/76 | Resp 69 | Ht 70.0 in | Wt 202.8 lb

## 2013-03-06 DIAGNOSIS — I4891 Unspecified atrial fibrillation: Secondary | ICD-10-CM

## 2013-03-06 DIAGNOSIS — I495 Sick sinus syndrome: Secondary | ICD-10-CM

## 2013-03-06 DIAGNOSIS — I10 Essential (primary) hypertension: Secondary | ICD-10-CM

## 2013-03-06 LAB — MDC_IDC_ENUM_SESS_TYPE_INCLINIC
Battery Remaining Longevity: 137 mo
Brady Statistic AP VP Percent: 1 %
Brady Statistic AP VS Percent: 99 %
Brady Statistic AS VP Percent: 0 %
Brady Statistic AS VS Percent: 0 %
Date Time Interrogation Session: 20150216161257
Lead Channel Impedance Value: 471 Ohm
Lead Channel Pacing Threshold Amplitude: 0.5 V
Lead Channel Pacing Threshold Amplitude: 0.75 V
Lead Channel Pacing Threshold Pulse Width: 0.4 ms
Lead Channel Sensing Intrinsic Amplitude: 0.7 mV
Lead Channel Sensing Intrinsic Amplitude: 8 mV
MDC IDC MSMT BATTERY IMPEDANCE: 137 Ohm
MDC IDC MSMT BATTERY VOLTAGE: 2.79 V
MDC IDC MSMT LEADCHNL RA IMPEDANCE VALUE: 406 Ohm
MDC IDC MSMT LEADCHNL RV PACING THRESHOLD PULSEWIDTH: 0.4 ms
MDC IDC SET LEADCHNL RA PACING AMPLITUDE: 2 V
MDC IDC SET LEADCHNL RV PACING AMPLITUDE: 2.5 V
MDC IDC SET LEADCHNL RV PACING PULSEWIDTH: 0.4 ms
MDC IDC SET LEADCHNL RV SENSING SENSITIVITY: 2.8 mV

## 2013-03-06 NOTE — Progress Notes (Signed)
PCP: Monico Blitz, MD Primary Cardiologist:  Dr Lossie Faes is a 80 y.o. male who presents today for routine electrophysiology followup.  He walks slowly with a cane.  He has some postural dizziness/ unsteadiness. Today, he denies symptoms of palpitations, chest pain, shortness of breath,  lower extremity edema, dizziness, presyncope, or syncope.  The patient is otherwise without complaint today.   Past Medical History  Diagnosis Date  . Sick sinus syndrome     Status post pacemaker placement 2008  . Paroxysmal atrial fibrillation     Declines coumadin  . Essential hypertension, benign   . Benign prostatic hypertrophy   . Renal insufficiency   . Coronary atherosclerosis of native coronary artery     Nonobstructive 2009  . Anemia     Dr. Sonny Dandy  . Hypothyroidism    Past Surgical History  Procedure Laterality Date  . Laminectomy    . Appendectomy    . Cholecystectomy    . Eye surgery    . Right rotator cuff repair    . Pacemaker placement  2008    [employer]tr[employer] - Dr. Doreatha Lew  . Pacemaker generator change  03/03/11    MDT Adaptal L implanted by Dr Rayann Heman    Current Outpatient Prescriptions  Medication Sig Dispense Refill  . amiodarone (PACERONE) 200 MG tablet Take 200 mg by mouth daily.        Marland Kitchen amLODipine (NORVASC) 5 MG tablet Take 5 mg by mouth daily.      Marland Kitchen aspirin 81 MG tablet Take 81 mg by mouth daily.        Marland Kitchen atenolol (TENORMIN) 50 MG tablet Take 1 tablet (50 mg total) by mouth daily.  90 tablet  3  . calcitRIOL (ROCALTROL) 0.5 MCG capsule Take 0.5 mcg by mouth daily.      Marland Kitchen docusate sodium (COLACE) 100 MG capsule Take 100 mg by mouth as needed for constipation.      . folic acid (FOLVITE) 1 MG tablet Take 1 mg by mouth daily.        . furosemide (LASIX) 80 MG tablet Take 40-80 mg by mouth 2 (two) times daily. 80mg  every am 40mg  every pm      . gabapentin (NEURONTIN) 400 MG capsule Take 400 mg by mouth daily.        Marland Kitchen HYDROcodone-acetaminophen (NORCO) 10-325 MG  per tablet Take 1 tablet by mouth every 8 (eight) hours as needed for pain.      Marland Kitchen levothyroxine (SYNTHROID, LEVOTHROID) 112 MCG tablet Take 112 mcg by mouth daily.      . Multiple Vitamin (MULTIVITAMIN) tablet Take 1 tablet by mouth daily.        . nitroGLYCERIN (NITROSTAT) 0.4 MG SL tablet Place 1 tablet (0.4 mg total) under the tongue every 5 (five) minutes as needed.  25 tablet  2  . omeprazole (PRILOSEC) 20 MG capsule Take 20 mg by mouth daily.        . Potassium 99 MG TABS Take 1 tablet by mouth daily.      . Tamsulosin HCl (FLOMAX) 0.4 MG CAPS Take 0.4 mg by mouth daily.         No current facility-administered medications for this visit.    Physical Exam: Filed Vitals:   03/06/13 1107  BP: 162/76  Resp: 69  Height: 5\' 10"  (1.778 m)  Weight: 202 lb 12.8 oz (91.989 kg)    GEN- The patient is well appearing, alert and oriented x 3 today.  Head- normocephalic, atraumatic Eyes-  Sclera clear, conjunctiva pink Ears- hearing intact Oropharynx- clear Lungs- Clear to ausculation bilaterally, normal work of breathing Chest- pacemaker pocket is well healed Heart- Regular rate and rhythm, no murmurs, rubs or gallops, PMI not laterally displaced GI- soft, NT, ND, + BS Extremities- no clubbing, cyanosis, trace edema  Pacemaker interrogation- reviewed in detail today,  See PACEART report  Assessment and Plan:  1. Sick sinus syndrome Normal pacemaker function See Pace Art report No changes today  2. afib Burden is very low (longest episode < 1 minute) He has declined anticoagulation Dr Domenic Polite to follow LFTs/TFTs  3. HTN Above goal today He reports BP at home is typically at goal Given postural dizziness, I am reluctant to make any changes today No changes today  carelink Return in 1 year

## 2013-03-06 NOTE — Patient Instructions (Signed)
Remote check with carelink every 3 months  Your physician recommends that you schedule a follow-up appointment in: 1 year with Dr. Rayann Heman. You should receive a letter in the mail in 10 months. If you do not receive this letter by December 2015 call our office to schedule this appointment.   Your physician recommends that you continue on your current medications as directed. Please refer to the Current Medication list given to you today.

## 2013-06-07 ENCOUNTER — Encounter: Payer: Medicare Other | Admitting: *Deleted

## 2013-06-07 ENCOUNTER — Telehealth: Payer: Self-pay | Admitting: Cardiology

## 2013-06-07 NOTE — Telephone Encounter (Signed)
LMOVM reminding pt to send remote transmission.   

## 2013-06-08 ENCOUNTER — Encounter: Payer: Self-pay | Admitting: Cardiology

## 2013-06-14 ENCOUNTER — Ambulatory Visit (INDEPENDENT_AMBULATORY_CARE_PROVIDER_SITE_OTHER): Payer: Medicare Other | Admitting: *Deleted

## 2013-06-14 DIAGNOSIS — I4891 Unspecified atrial fibrillation: Secondary | ICD-10-CM

## 2013-06-14 DIAGNOSIS — I495 Sick sinus syndrome: Secondary | ICD-10-CM

## 2013-06-14 LAB — MDC_IDC_ENUM_SESS_TYPE_REMOTE
Battery Impedance: 113 Ohm
Battery Remaining Longevity: 138 mo
Brady Statistic AP VS Percent: 99 %
Brady Statistic AS VS Percent: 0 %
Date Time Interrogation Session: 20150527183200
Lead Channel Impedance Value: 464 Ohm
Lead Channel Pacing Threshold Amplitude: 0.75 V
Lead Channel Pacing Threshold Pulse Width: 0.4 ms
Lead Channel Pacing Threshold Pulse Width: 0.4 ms
Lead Channel Sensing Intrinsic Amplitude: 22.4 mV
MDC IDC MSMT BATTERY VOLTAGE: 2.79 V
MDC IDC MSMT LEADCHNL RA IMPEDANCE VALUE: 412 Ohm
MDC IDC MSMT LEADCHNL RA PACING THRESHOLD AMPLITUDE: 0.625 V
MDC IDC SET LEADCHNL RA PACING AMPLITUDE: 2 V
MDC IDC SET LEADCHNL RV PACING AMPLITUDE: 2.5 V
MDC IDC SET LEADCHNL RV PACING PULSEWIDTH: 0.4 ms
MDC IDC SET LEADCHNL RV SENSING SENSITIVITY: 2.8 mV
MDC IDC STAT BRADY AP VP PERCENT: 0 %
MDC IDC STAT BRADY AS VP PERCENT: 0 %

## 2013-06-15 NOTE — Progress Notes (Signed)
Remote pacemaker transmission.   

## 2013-07-18 ENCOUNTER — Encounter: Payer: Self-pay | Admitting: Cardiology

## 2013-09-18 ENCOUNTER — Ambulatory Visit (INDEPENDENT_AMBULATORY_CARE_PROVIDER_SITE_OTHER): Payer: Medicare Other | Admitting: *Deleted

## 2013-09-18 DIAGNOSIS — I495 Sick sinus syndrome: Secondary | ICD-10-CM

## 2013-09-18 NOTE — Progress Notes (Signed)
Remote pacemaker transmission.   

## 2013-09-19 ENCOUNTER — Encounter: Payer: Self-pay | Admitting: Internal Medicine

## 2013-09-19 LAB — MDC_IDC_ENUM_SESS_TYPE_REMOTE
Battery Impedance: 137 Ohm
Battery Voltage: 2.79 V
Brady Statistic AP VP Percent: 0 %
Brady Statistic AS VS Percent: 0 %
Date Time Interrogation Session: 20150831141341
Lead Channel Impedance Value: 424 Ohm
Lead Channel Impedance Value: 498 Ohm
Lead Channel Pacing Threshold Amplitude: 0.625 V
Lead Channel Pacing Threshold Pulse Width: 0.4 ms
Lead Channel Setting Pacing Amplitude: 2.5 V
Lead Channel Setting Sensing Sensitivity: 5.6 mV
MDC IDC MSMT BATTERY REMAINING LONGEVITY: 130 mo
MDC IDC MSMT LEADCHNL RA PACING THRESHOLD AMPLITUDE: 0.625 V
MDC IDC MSMT LEADCHNL RA PACING THRESHOLD PULSEWIDTH: 0.4 ms
MDC IDC MSMT LEADCHNL RV SENSING INTR AMPL: 8 mV
MDC IDC SET LEADCHNL RA PACING AMPLITUDE: 2 V
MDC IDC SET LEADCHNL RV PACING PULSEWIDTH: 0.4 ms
MDC IDC STAT BRADY AP VS PERCENT: 100 %
MDC IDC STAT BRADY AS VP PERCENT: 0 %

## 2013-09-27 ENCOUNTER — Encounter: Payer: Self-pay | Admitting: Cardiology

## 2013-10-19 ENCOUNTER — Encounter: Payer: Self-pay | Admitting: Internal Medicine

## 2013-12-20 ENCOUNTER — Ambulatory Visit (INDEPENDENT_AMBULATORY_CARE_PROVIDER_SITE_OTHER): Payer: Medicare Other | Admitting: *Deleted

## 2013-12-20 DIAGNOSIS — I495 Sick sinus syndrome: Secondary | ICD-10-CM

## 2013-12-20 NOTE — Progress Notes (Signed)
Remote pacemaker transmission.   

## 2013-12-21 LAB — MDC_IDC_ENUM_SESS_TYPE_REMOTE
Battery Remaining Longevity: 126 mo
Brady Statistic AS VS Percent: 0 %
Lead Channel Impedance Value: 499 Ohm
Lead Channel Pacing Threshold Amplitude: 0.625 V
Lead Channel Pacing Threshold Pulse Width: 0.4 ms
Lead Channel Sensing Intrinsic Amplitude: 8 mV
Lead Channel Setting Pacing Amplitude: 2 V
Lead Channel Setting Pacing Amplitude: 2.5 V
Lead Channel Setting Sensing Sensitivity: 4 mV
MDC IDC MSMT BATTERY IMPEDANCE: 162 Ohm
MDC IDC MSMT BATTERY VOLTAGE: 2.79 V
MDC IDC MSMT LEADCHNL RA IMPEDANCE VALUE: 418 Ohm
MDC IDC MSMT LEADCHNL RA PACING THRESHOLD AMPLITUDE: 0.625 V
MDC IDC MSMT LEADCHNL RA PACING THRESHOLD PULSEWIDTH: 0.4 ms
MDC IDC SESS DTM: 20151202163149
MDC IDC SET LEADCHNL RV PACING PULSEWIDTH: 0.4 ms
MDC IDC STAT BRADY AP VP PERCENT: 0 %
MDC IDC STAT BRADY AP VS PERCENT: 100 %
MDC IDC STAT BRADY AS VP PERCENT: 0 %

## 2013-12-26 ENCOUNTER — Encounter: Payer: Self-pay | Admitting: Cardiology

## 2013-12-28 ENCOUNTER — Encounter (HOSPITAL_COMMUNITY): Payer: Self-pay | Admitting: Internal Medicine

## 2014-01-03 ENCOUNTER — Encounter: Payer: Self-pay | Admitting: Internal Medicine

## 2014-03-23 ENCOUNTER — Ambulatory Visit (INDEPENDENT_AMBULATORY_CARE_PROVIDER_SITE_OTHER): Payer: Medicare Other | Admitting: Internal Medicine

## 2014-03-23 ENCOUNTER — Encounter: Payer: Self-pay | Admitting: Internal Medicine

## 2014-03-23 VITALS — BP 148/79 | HR 80 | Ht 70.0 in | Wt 195.0 lb

## 2014-03-23 DIAGNOSIS — I10 Essential (primary) hypertension: Secondary | ICD-10-CM

## 2014-03-23 DIAGNOSIS — I495 Sick sinus syndrome: Secondary | ICD-10-CM

## 2014-03-23 DIAGNOSIS — I480 Paroxysmal atrial fibrillation: Secondary | ICD-10-CM

## 2014-03-23 LAB — MDC_IDC_ENUM_SESS_TYPE_INCLINIC
Battery Voltage: 2.79 V
Brady Statistic AP VP Percent: 0 %
Brady Statistic AS VS Percent: 0 %
Lead Channel Impedance Value: 471 Ohm
Lead Channel Pacing Threshold Pulse Width: 0.4 ms
Lead Channel Sensing Intrinsic Amplitude: 1.4 mV
Lead Channel Sensing Intrinsic Amplitude: 11.2 mV
Lead Channel Setting Pacing Amplitude: 2 V
Lead Channel Setting Sensing Sensitivity: 4 mV
MDC IDC MSMT BATTERY IMPEDANCE: 162 Ohm
MDC IDC MSMT BATTERY REMAINING LONGEVITY: 125 mo
MDC IDC MSMT LEADCHNL RA IMPEDANCE VALUE: 418 Ohm
MDC IDC MSMT LEADCHNL RA PACING THRESHOLD AMPLITUDE: 0.5 V
MDC IDC MSMT LEADCHNL RA PACING THRESHOLD PULSEWIDTH: 0.4 ms
MDC IDC MSMT LEADCHNL RV PACING THRESHOLD AMPLITUDE: 0.5 V
MDC IDC SESS DTM: 20160304122718
MDC IDC SET LEADCHNL RV PACING AMPLITUDE: 2.5 V
MDC IDC SET LEADCHNL RV PACING PULSEWIDTH: 0.4 ms
MDC IDC STAT BRADY AP VS PERCENT: 100 %
MDC IDC STAT BRADY AS VP PERCENT: 0 %

## 2014-03-23 NOTE — Patient Instructions (Signed)
Your physician recommends that you schedule a follow-up appointment in: 1 year with Dr. Rayann Heman. You will receive a reminder letter in the mail in about 10 months reminding you to call and schedule your appointment. If you don't receive this letter, please contact our office. Carelink device check 06/25/14. Your physician recommends that you continue on your current medications as directed. Please refer to the Current Medication list given to you today.

## 2014-03-24 NOTE — Progress Notes (Signed)
Electrophysiology Office Note   Date:  03/24/2014   ID:  Dale, Torres 1925-05-08, MRN [id number]  PCP:  Monico Blitz, MD  Cardiologist:  Dr Domenic Polite Primary Electrophysiologist: Thompson Grayer, MD    No chief complaint on file.    History of Present Illness: Dale Torres is a 81 y.o. male who presents today for electrophysiology evaluation.   This elderly gentleman is doing reasonably well.  He walks slowly with a cane.  He is without new complaints. Today, he denies symptoms of palpitations, chest pain, shortness of breath, orthopnea, PND, lower extremity edema, claudication, dizziness, presyncope, syncope, bleeding, or neurologic sequela. The patient is tolerating medications without difficulties and is otherwise without complaint today.    Past Medical History  Diagnosis Date  . Sick sinus syndrome     Status post pacemaker placement 2008  . Paroxysmal atrial fibrillation     Declines coumadin  . Essential hypertension, benign   . Benign prostatic hypertrophy   . Renal insufficiency   . Coronary atherosclerosis of native coronary artery     Nonobstructive 2009  . Anemia     Dr. Sonny Dandy  . Hypothyroidism    Past Surgical History  Procedure Laterality Date  . Laminectomy    . Appendectomy    . Cholecystectomy    . Eye surgery    . Right rotator cuff repair    . Pacemaker placement  2008    [employer] - Dr. Doreatha Lew  . Pacemaker generator change  03/03/11    MDT Adaptal L implanted by Dr Rayann Heman  . Permanent pacemaker generator change N/A 03/03/2011    Procedure: PERMANENT PACEMAKER GENERATOR CHANGE;  Surgeon: Thompson Grayer, MD;  Location: [facility] CATH LAB;  Service: Cardiovascular;  Laterality: N/A;     Current Outpatient Prescriptions  Medication Sig Dispense Refill  . amiodarone (PACERONE) 200 MG tablet Take 200 mg by mouth daily.      Marland Kitchen amLODipine (NORVASC) 5 MG tablet Take 5 mg by mouth daily.    Marland Kitchen aspirin 81 MG tablet Take 81 mg by mouth daily.      Marland Kitchen atenolol  (TENORMIN) 50 MG tablet Take 1 tablet (50 mg total) by mouth daily. 90 tablet 3  . calcitRIOL (ROCALTROL) 0.5 MCG capsule Take 0.5 mcg by mouth daily.    Marland Kitchen docusate sodium (COLACE) 100 MG capsule Take 100 mg by mouth as needed for constipation.    . folic acid (FOLVITE) 1 MG tablet Take 1 mg by mouth daily.      . furosemide (LASIX) 80 MG tablet Take 40-80 mg by mouth 2 (two) times daily. 80mg  every am 40mg  every pm    . gabapentin (NEURONTIN) 400 MG capsule Take 400 mg by mouth daily.      Marland Kitchen HYDROcodone-acetaminophen (NORCO) 10-325 MG per tablet Take 1 tablet by mouth every 8 (eight) hours as needed for pain.    Marland Kitchen levothyroxine (SYNTHROID, LEVOTHROID) 112 MCG tablet Take 112 mcg by mouth daily.    . Multiple Vitamin (MULTIVITAMIN) tablet Take 1 tablet by mouth daily.      . nitroGLYCERIN (NITROSTAT) 0.4 MG SL tablet Place 1 tablet (0.4 mg total) under the tongue every 5 (five) minutes as needed. 25 tablet 2  . omeprazole (PRILOSEC) 20 MG capsule Take 20 mg by mouth daily.      . Potassium 99 MG TABS Take 1 tablet by mouth daily.    . Tamsulosin HCl (FLOMAX) 0.4 MG CAPS Take 0.4 mg by mouth daily.  No current facility-administered medications for this visit.    Allergies:   Oxycodone hcl   Social History:  The patient  reports that he quit smoking about 54 years ago. His smoking use included Cigarettes. He has a 9.6 pack-year smoking history. He quit smokeless tobacco use about 34 years ago. His smokeless tobacco use included Chew. He reports that he drinks alcohol. He reports that he does not use illicit drugs.   Family History:  The patient's family history includes Coronary artery disease in his father, mother, and sister.    ROS:  Please see the history of present illness.   All other systems are reviewed and negative.    PHYSICAL EXAM: VS:  BP 148/79 mmHg  Pulse 80  Ht 5\' 10"  (1.778 m)  Wt 195 lb (88.451 kg)  BMI 27.98 kg/m2  SpO2 100% , BMI Body mass index is 27.98  kg/(m^2). GEN: Walks slowly with a cane in no acute distress HEENT: normal Neck: no JVD, carotid bruits, or masses Cardiac: RRR; no murmurs, rubs, or gallops,no edema  Respiratory:  clear to auscultation bilaterally, normal work of breathing GI: soft, nontender, nondistended, + BS MS: no deformity or atrophy Skin: warm and dry, device pocket is well healed Neuro:  Strength and sensation are intact Psych: euthymic mood, full affect  Device interrogation is reviewed today in detail.  See PaceArt for details.  Wt Readings from Last 3 Encounters:  03/23/14 195 lb (88.451 kg)  03/06/13 202 lb 12.8 oz (91.989 kg)  01/06/13 202 lb 12.8 oz (91.989 kg)     ASSESSMENT AND PLAN:  1. Sick sinus syndrome Normal pacemaker function See Pace Art report No changes today  2. afib Burden is very low (longest episode < 1 minute) He has declined anticoagulation Dr Domenic Polite to follow LFTs/TFTs  3. HTN Stable Given postural dizziness, I am reluctant to make any changes today   carelink Return in 1 year  Current medicines are reviewed at length with the patient today.   The patient does not have concerns regarding his medicines.  The following changes were made today:  none  Labs/ tests ordered today include:  Orders Placed This Encounter  Procedures  . Implantable device check   Signed, Thompson Grayer, MD  03/24/2014 10:22 PM     [address] [place] [address] [phone] (office) [phone] (fax)

## 2014-04-02 ENCOUNTER — Encounter: Payer: Self-pay | Admitting: Internal Medicine

## 2014-06-25 ENCOUNTER — Telehealth: Payer: Self-pay | Admitting: Cardiology

## 2014-06-25 ENCOUNTER — Ambulatory Visit (INDEPENDENT_AMBULATORY_CARE_PROVIDER_SITE_OTHER): Payer: Medicare Other | Admitting: *Deleted

## 2014-06-25 DIAGNOSIS — I495 Sick sinus syndrome: Secondary | ICD-10-CM | POA: Diagnosis not present

## 2014-06-25 NOTE — Telephone Encounter (Signed)
Spoke with pt and reminded pt of remote transmission that is due today. Pt verbalized understanding.   

## 2014-06-26 NOTE — Progress Notes (Signed)
Remote pacemaker transmission.   

## 2014-06-29 ENCOUNTER — Other Ambulatory Visit: Payer: Self-pay | Admitting: *Deleted

## 2014-06-29 DIAGNOSIS — N184 Chronic kidney disease, stage 4 (severe): Secondary | ICD-10-CM

## 2014-06-29 DIAGNOSIS — Z01810 Encounter for preprocedural cardiovascular examination: Secondary | ICD-10-CM

## 2014-06-30 LAB — CUP PACEART REMOTE DEVICE CHECK
Battery Impedance: 162 Ohm
Battery Remaining Longevity: 125 mo
Brady Statistic AP VS Percent: 96 %
Brady Statistic AS VP Percent: 0 %
Date Time Interrogation Session: 20160606182942
Lead Channel Pacing Threshold Amplitude: 0.625 V
Lead Channel Pacing Threshold Amplitude: 0.75 V
Lead Channel Pacing Threshold Pulse Width: 0.4 ms
Lead Channel Pacing Threshold Pulse Width: 0.4 ms
Lead Channel Sensing Intrinsic Amplitude: 8 mV
Lead Channel Setting Pacing Amplitude: 2 V
Lead Channel Setting Pacing Amplitude: 2.5 V
Lead Channel Setting Pacing Pulse Width: 0.4 ms
Lead Channel Setting Sensing Sensitivity: 4 mV
MDC IDC MSMT BATTERY VOLTAGE: 2.79 V
MDC IDC MSMT LEADCHNL RA IMPEDANCE VALUE: 417 Ohm
MDC IDC MSMT LEADCHNL RV IMPEDANCE VALUE: 488 Ohm
MDC IDC STAT BRADY AP VP PERCENT: 3 %
MDC IDC STAT BRADY AS VS PERCENT: 0 %

## 2014-07-09 ENCOUNTER — Encounter: Payer: Self-pay | Admitting: Cardiology

## 2014-07-11 ENCOUNTER — Encounter: Payer: Self-pay | Admitting: Internal Medicine

## 2014-07-30 ENCOUNTER — Encounter: Payer: Self-pay | Admitting: Vascular Surgery

## 2014-08-01 ENCOUNTER — Ambulatory Visit (HOSPITAL_COMMUNITY)
Admission: RE | Admit: 2014-08-01 | Discharge: 2014-08-01 | Disposition: A | Discharge status: Home / Self Care | Payer: Medicare Other | Source: Ambulatory Visit | Attending: Vascular Surgery | Admitting: Vascular Surgery

## 2014-08-01 ENCOUNTER — Ambulatory Visit (INDEPENDENT_AMBULATORY_CARE_PROVIDER_SITE_OTHER)
Admission: RE | Admit: 2014-08-01 | Discharge: 2014-08-01 | Disposition: A | Discharge status: Home / Self Care | Payer: Medicare Other | Source: Ambulatory Visit | Attending: Vascular Surgery | Admitting: Vascular Surgery

## 2014-08-01 ENCOUNTER — Encounter: Payer: Self-pay | Admitting: Vascular Surgery

## 2014-08-01 ENCOUNTER — Ambulatory Visit (INDEPENDENT_AMBULATORY_CARE_PROVIDER_SITE_OTHER): Payer: Medicare Other | Admitting: Vascular Surgery

## 2014-08-01 VITALS — BP 127/76 | HR 66 | Temp 97.9°F | Resp 16 | Ht 70.0 in | Wt 200.0 lb

## 2014-08-01 DIAGNOSIS — Z01810 Encounter for preprocedural cardiovascular examination: Secondary | ICD-10-CM | POA: Diagnosis not present

## 2014-08-01 DIAGNOSIS — N184 Chronic kidney disease, stage 4 (severe): Secondary | ICD-10-CM | POA: Diagnosis not present

## 2014-08-01 NOTE — Progress Notes (Signed)
[facility] [place]  Patient name: Dale Torres MRN: [id number] DOB: 1925-09-22 Sex: male  REASON FOR CONSULT: evaluate for hemodialysis access. Referred by Dr. Lowanda Foster  HPI: Dale Torres is a 81 y.o. male who is not yet on dialysis. He was referred for evaluation for hemodialysis access.  I have reviewed the records that were sent with him from Dr. Florentina Addison office. He does have a history of congestive heart failure, anemia, and chronic kidney disease bone mineral disease. In addition he has some history of hyponatremia. His stage IV chronic kidney disease is most likely secondary to FSGS. His creatinine on 06/01/2014 was 3.71. Hemoglobin was 11.2 in March of this year.   He denies any recent uremic symptoms. Specifically he denies nausea, vomiting, fatigue, anorexia, or palpitations.  He does have a pacemaker on the right side. He is right-handed.  Past Medical History  Diagnosis Date  . Sick sinus syndrome     Status post pacemaker placement 2008  . Paroxysmal atrial fibrillation     Declines coumadin  . Essential hypertension, benign   . Benign prostatic hypertrophy   . Renal insufficiency   . Coronary atherosclerosis of native coronary artery     Nonobstructive 2009  . Anemia     Dr. Sonny Dandy  . Hypothyroidism   . CHF (congestive heart failure)    Family History  Problem Relation Age of Onset  . Coronary artery disease Father   . Heart disease Father     after age 71  . Coronary artery disease Mother   . Heart disease Mother     After age 19  . Coronary artery disease Sister   . Heart disease Sister     After age 9   SOCIAL HISTORY: History  Substance Use Topics  . Smoking status: Former Smoker -- 0.80 packs/day for 12 years    Types: Cigarettes    Quit date: 01/20/1960  . Smokeless tobacco: Former Systems developer    Types: Chew    Quit date: 01/20/1980     Comment: chewed tobacco x's 10 years while playing golf only,used a pack per week  .  Alcohol Use: 0.0 oz/week    0 Standard drinks or equivalent per week     Comment: DRINKS 3-4 OZS OF SCOTCH PER WEEK   Allergies  Allergen Reactions  . Oxycodone Hcl Rash   Current Outpatient Prescriptions  Medication Sig Dispense Refill  . amiodarone (PACERONE) 200 MG tablet Take 200 mg by mouth daily.      Marland Kitchen amLODipine (NORVASC) 5 MG tablet Take 5 mg by mouth daily.    Marland Kitchen aspirin 81 MG tablet Take 81 mg by mouth daily.      Marland Kitchen atenolol (TENORMIN) 50 MG tablet Take 1 tablet (50 mg total) by mouth daily. 90 tablet 3  . calcitRIOL (ROCALTROL) 0.5 MCG capsule Take 0.5 mcg by mouth daily.    Marland Kitchen docusate sodium (COLACE) 100 MG capsule Take 100 mg by mouth as needed for constipation.    . folic acid (FOLVITE) 1 MG tablet Take 1 mg by mouth daily.      . furosemide (LASIX) 80 MG tablet Take 40-80 mg by mouth 2 (two) times daily. 80mg  every am 40mg  every pm    . gabapentin (NEURONTIN) 400 MG capsule Take 400 mg by mouth daily.      Marland Kitchen HYDROcodone-acetaminophen (NORCO) 10-325 MG per tablet Take 1 tablet by mouth every 8 (eight) hours as needed for pain.    Marland Kitchen  levothyroxine (SYNTHROID, LEVOTHROID) 112 MCG tablet Take 112 mcg by mouth daily.    . Multiple Vitamin (MULTIVITAMIN) tablet Take 1 tablet by mouth daily.      . nitroGLYCERIN (NITROSTAT) 0.4 MG SL tablet Place 1 tablet (0.4 mg total) under the tongue every 5 (five) minutes as needed. 25 tablet 2  . omeprazole (PRILOSEC) 20 MG capsule Take 20 mg by mouth daily.      . Tamsulosin HCl (FLOMAX) 0.4 MG CAPS Take 0.4 mg by mouth daily.      . Potassium 99 MG TABS Take 1 tablet by mouth daily.     No current facility-administered medications for this visit.   REVIEW OF SYSTEMS: Valu.Nieves ] denotes positive finding; [  ] denotes negative finding  CARDIOVASCULAR:  [ ]  chest pain   [ ]  chest pressure   [ ]  palpitations   [ ]  orthopnea   Valu.Nieves ] dyspnea on exertion   Valu.Nieves ] claudication   [ ]  rest pain   [ ]  DVT   [ ]  phlebitis PULMONARY:   [ ]  productive cough    [ ]  asthma   [ ]  wheezing NEUROLOGIC:   Valu.Nieves ] weakness  [ ]  paresthesias  [ ]  aphasia  [ ]  amaurosis  [ ]  dizziness HEMATOLOGIC:   [ ]  bleeding problems   [ ]  clotting disorders MUSCULOSKELETAL:  [ ]  joint pain   [ ]  joint swelling Valu.Nieves ] leg swelling GASTROINTESTINAL: [ ]   blood in stool  [ ]   hematemesis GENITOURINARY:  Valu.Nieves ]  dysuria  [ ]   hematuria PSYCHIATRIC:  [ ]  history of major depression INTEGUMENTARY:  [ ]  rashes  [ ]  ulcers CONSTITUTIONAL:  [ ]  fever   [ ]  chills  PHYSICAL EXAM: Filed Vitals:   08/01/14 1412  BP: 127/76  Pulse: 66  Temp: 97.9 F (36.6 C)  TempSrc: Oral  Resp: 16  Height: 5\' 10"  (1.778 m)  Weight: 200 lb (90.719 kg)  SpO2: 98%   GENERAL: The patient is a well-nourished male, in no acute distress. The vital signs are documented above. CARDIOVASCULAR: There is a regular rate and rhythm. I do not detect carotid bruits. He has palpable radial pulses bilaterally. PULMONARY: There is good air exchange bilaterally without wheezing or rales. ABDOMEN: Soft and non-tender with normal pitched bowel sounds.  MUSCULOSKELETAL: There are no major deformities or cyanosis. NEUROLOGIC: No focal weakness or paresthesias are detected. SKIN: There are no ulcers or rashes noted. PSYCHIATRIC: The patient has a normal affect.  DATA:  I have independently interpreted his upper extremity arterial Doppler study which shows triphasic Doppler signals in both the radial and ulnar positions bilaterally with a normal Allen test bilaterally.  I have also independently interpreted his upper extremity vein mapping which shows that he appears to have an adequate upper arm and forearm cephalic vein in both upper extremities and also an adequate basilic vein bilaterally.  MEDICAL ISSUES:  STAGE IV CHRONIC KIDNEY DISEASE: Based on his vein map, he appears to be a good candidate for a fistula in the left arm. Certainly I would try to avoid the right arm given his pacemaker on the right side.  He is also right hand dominant. I have explained the indications for placement of an AV fistula or AV graft. I've explained that if at all possible we will place an AV fistula.  I have reviewed the risks of placement of an AV fistula including but not limited to: failure of the  fistula to mature, need for subsequent interventions, and thrombosis. In addition I have reviewed the potential complications of placement of an AV graft. These risks include, but are not limited to, graft thrombosis, graft infection, wound healing problems, bleeding, arm swelling, and steal syndrome. All the patient's questions were answered and they are agreeable to proceed with surgery. His surgery is scheduled for 08/16/2014.  Deitra Mayo [facility] [place]: [phone]

## 2014-08-02 ENCOUNTER — Other Ambulatory Visit: Payer: Self-pay

## 2014-08-15 ENCOUNTER — Encounter (HOSPITAL_COMMUNITY): Payer: Self-pay | Admitting: *Deleted

## 2014-08-15 MED ORDER — SODIUM CHLORIDE 0.9 % IV SOLN
INTRAVENOUS | Status: DC
  Administered 2014-08-16: 12:00:00 via INTRAVENOUS

## 2014-08-15 MED ORDER — DEXTROSE 5 % IV SOLN
1.5000 g | INTRAVENOUS | Status: AC
  Administered 2014-08-16: 1.5 g via INTRAVENOUS
  Filled 2014-08-15: qty 1.5

## 2014-08-15 NOTE — Anesthesia Preprocedure Evaluation (Addendum)
Anesthesia Evaluation  Patient identified by MRN, date of birth, ID band Patient awake    Reviewed: Allergy & Precautions, NPO status , Patient's Chart, lab work & pertinent test results, reviewed documented beta blocker date and time   History of Anesthesia Complications History of anesthetic complications: constipation following anesthesia, Narcotics?  Airway Mallampati: II   Neck ROM: Full    Dental  (+) Partial Lower, Dental Advisory Given   Pulmonary former smoker,  breath sounds clear to auscultation        Cardiovascular hypertension, Pt. on medications + CAD and +CHF + pacemaker Rhythm:Regular  ECHO 2014 EF 40%, Cath 2009 with minimal CAD, Pacer, intermittent AF, SSS   Neuro/Psych    GI/Hepatic Neg liver ROS, GERD-  Medicated,  Endo/Other    Renal/GU Renal InsufficiencyRenal diseaseRI stage IV     Musculoskeletal   Abdominal (+)  Abdomen: soft.    Peds  Hematology  (+) anemia ,   Anesthesia Other Findings   Reproductive/Obstetrics                            Anesthesia Physical Anesthesia Plan  ASA: III  Anesthesia Plan: MAC   Post-op Pain Management:    Induction: Intravenous  Airway Management Planned: Nasal Cannula  Additional Equipment:   Intra-op Plan:   Post-operative Plan:   Informed Consent: I have reviewed the patients History and Physical, chart, labs and discussed the procedure including the risks, benefits and alternatives for the proposed anesthesia with the patient or authorized representative who has indicated his/her understanding and acceptance.     Plan Discussed with:   Anesthesia Plan Comments: (Check am labs, labs OK)       Anesthesia Quick Evaluation

## 2014-08-15 NOTE — Progress Notes (Signed)
Unable to reach patient via telephone. Left note on chart that patient does not need to arrive til 900 am.

## 2014-08-15 NOTE — Progress Notes (Signed)
   08/15/14 1336  OBSTRUCTIVE SLEEP APNEA  Have you ever been diagnosed with sleep apnea through a sleep study? No  Do you snore loudly (loud enough to be heard through closed doors)?  0  Do you often feel tired, fatigued, or sleepy during the daytime? 1  Has anyone observed you stop breathing during your sleep? 1  Do you have, or are you being treated for high blood pressure? 1  BMI more than 35 kg/m2? 0  Age over 79 years old? 1  Gender: 1  Obstructive Sleep Apnea Score 5

## 2014-08-15 NOTE — Progress Notes (Signed)
Anesthesia Chart Review: SAME DAY WORK-UP.  Patient is a 81 year old male scheduled for creation of LUE AVF tomorrow by Dr. Scot Dock. Case is posted for MAC.  History includes non-obstructive CAD by '09 cath, SSS s/p Medtronic PPM (right) '08 (generator change '13), PAF (declined warfarin), CKD stage IV (not yet on dialysis), BPH, anemia, hypothyroidism.   PCP is Dr. Monico Blitz. Nephrologist is Dr. Lowanda Foster. Primary cardiologist is Dr. Domenic Polite, last visit 01/06/13 with continued medical therapy recommended following his 2014 stress and echo. EP cardiologist is Dr. Rayann Heman, last visit 03/23/14 with normal device function and low burden of afib. He denied chest pain, palpitations, SOB, orthopnea, PND, edema, dizziness, presyncope/syncope at that visit.  06/29/12 Echo: - Left ventricle: The cavity size was normal. There was mild concentric hypertrophy. Systolic function was mildly to moderately reduced. The estimated ejection fraction was in the range of 40% to 45%. There is hypokinesis of the basal-midinferior myocardium. There is hypokinesis of the mid-distalanteroseptal myocardium. Doppler parameters are consistent with abnormal left ventricular relaxation (grade 1 diastolic dysfunction). - Aortic valve: Trileaflet; mildly thickened leaflets. Trivial regurgitation. Mean gradient: 46mm Hg (S). Valve area: 2.67cm^2(VTI). - Mitral valve: Mild regurgitation. - Left atrium: The atrium was moderately dilated. - Right ventricle: Pacer wire or catheter noted in right ventricle. - Right atrium: Central venous pressure: 1mm Hg (est). - Tricuspid valve: Mild regurgitation. - Pulmonic valve: Mild regurgitation. - Pulmonary arteries: PA peak pressure: 63mm Hg (S). - Pericardium, extracardiac: There was no pericardial effusion.  06/27/12 Nuclear stress test [facility]; scanned in Epic):  Mild to moderate sized, mild to moderate intensity inferior/inferior septal defect, somewhat  variable but predominantly fixed. Could be consistent with ventricular pacing, however, cannot exclude scar with minor peri-infarct ischemia. Calcultated stress LVEF was 43%. TID ratio 1:1. Mild LV dilation. This is a low risk study.  06/14/07 Cardiac cath: CORONARY ARTERIES: 1. Left main coronary artery is normal. 2. Intermediate coronary artery is large. It is normal. 3. Left circumflex is a moderately large vessel. It is normal. 4. Left anterior descending had a 50-60% stenosis in its midportion. There are minor irregularities, otherwise. 5. Right coronary artery was a large dominant vessel. There are irregularities present. OVERALL IMPRESSION: 1. Mild anterior hypokinesis with overall well-preserved global left ventricular function. 2. There is a borderline normal left ventricular filling pressures. 3. Minimal coronary atherosclerosis with 50%-60% narrowing in the mid left anterior descending with irregularities in the right coronary artery and vessels normal, otherwise.  He is for an EKG and labs on arrival.  Results will be reviewed by his anesthesiologist and patient evaluated. If patient remains asymptomatic from a CV standpoint and EKG and labs are felt acceptable for the OR then I would anticipate that he could proceed with hemodialysis access procedure.   George Hugh [facility] Short Stay Center/Anesthesiology Phone [phone] 08/15/2014 11:57 AM

## 2014-08-16 ENCOUNTER — Telehealth: Payer: Self-pay | Admitting: Vascular Surgery

## 2014-08-16 ENCOUNTER — Encounter (HOSPITAL_COMMUNITY): Payer: Self-pay | Admitting: *Deleted

## 2014-08-16 ENCOUNTER — Encounter (HOSPITAL_COMMUNITY)
Admission: RE | Disposition: A | Discharge status: Home / Self Care | Payer: Self-pay | Source: Ambulatory Visit | Attending: Vascular Surgery

## 2014-08-16 ENCOUNTER — Ambulatory Visit (HOSPITAL_COMMUNITY): Payer: Medicare Other | Admitting: Vascular Surgery

## 2014-08-16 ENCOUNTER — Ambulatory Visit (HOSPITAL_COMMUNITY)
Admission: RE | Admit: 2014-08-16 | Discharge: 2014-08-16 | Disposition: A | Discharge status: Home / Self Care | Payer: Medicare Other | Source: Ambulatory Visit | Attending: Vascular Surgery | Admitting: Vascular Surgery

## 2014-08-16 ENCOUNTER — Other Ambulatory Visit: Payer: Self-pay | Admitting: *Deleted

## 2014-08-16 ENCOUNTER — Other Ambulatory Visit: Payer: Medicare Other | Admitting: *Deleted

## 2014-08-16 DIAGNOSIS — R9431 Abnormal electrocardiogram [ECG] [EKG]: Secondary | ICD-10-CM | POA: Insufficient documentation

## 2014-08-16 DIAGNOSIS — I120 Hypertensive chronic kidney disease with stage 5 chronic kidney disease or end stage renal disease: Secondary | ICD-10-CM | POA: Insufficient documentation

## 2014-08-16 DIAGNOSIS — Z950 Presence of cardiac pacemaker: Secondary | ICD-10-CM | POA: Insufficient documentation

## 2014-08-16 DIAGNOSIS — N186 End stage renal disease: Secondary | ICD-10-CM

## 2014-08-16 DIAGNOSIS — Z87891 Personal history of nicotine dependence: Secondary | ICD-10-CM | POA: Insufficient documentation

## 2014-08-16 DIAGNOSIS — I509 Heart failure, unspecified: Secondary | ICD-10-CM | POA: Diagnosis not present

## 2014-08-16 DIAGNOSIS — I447 Left bundle-branch block, unspecified: Secondary | ICD-10-CM | POA: Insufficient documentation

## 2014-08-16 DIAGNOSIS — I4589 Other specified conduction disorders: Secondary | ICD-10-CM | POA: Insufficient documentation

## 2014-08-16 DIAGNOSIS — I2510 Atherosclerotic heart disease of native coronary artery without angina pectoris: Secondary | ICD-10-CM | POA: Insufficient documentation

## 2014-08-16 DIAGNOSIS — E039 Hypothyroidism, unspecified: Secondary | ICD-10-CM | POA: Diagnosis not present

## 2014-08-16 DIAGNOSIS — N184 Chronic kidney disease, stage 4 (severe): Secondary | ICD-10-CM | POA: Diagnosis not present

## 2014-08-16 DIAGNOSIS — N400 Enlarged prostate without lower urinary tract symptoms: Secondary | ICD-10-CM | POA: Insufficient documentation

## 2014-08-16 DIAGNOSIS — Z4931 Encounter for adequacy testing for hemodialysis: Secondary | ICD-10-CM

## 2014-08-16 DIAGNOSIS — Z79899 Other long term (current) drug therapy: Secondary | ICD-10-CM | POA: Diagnosis not present

## 2014-08-16 DIAGNOSIS — D649 Anemia, unspecified: Secondary | ICD-10-CM | POA: Insufficient documentation

## 2014-08-16 DIAGNOSIS — Z7982 Long term (current) use of aspirin: Secondary | ICD-10-CM | POA: Diagnosis not present

## 2014-08-16 DIAGNOSIS — I480 Paroxysmal atrial fibrillation: Secondary | ICD-10-CM | POA: Insufficient documentation

## 2014-08-16 DIAGNOSIS — K219 Gastro-esophageal reflux disease without esophagitis: Secondary | ICD-10-CM | POA: Diagnosis not present

## 2014-08-16 HISTORY — DX: Carpal tunnel syndrome, left upper limb: G56.02

## 2014-08-16 HISTORY — DX: Headache: R51

## 2014-08-16 HISTORY — DX: Unspecified osteoarthritis, unspecified site: M19.90

## 2014-08-16 HISTORY — DX: Unspecified hearing loss, unspecified ear: H91.90

## 2014-08-16 HISTORY — PX: AV FISTULA PLACEMENT: SHX1204

## 2014-08-16 HISTORY — DX: Headache, unspecified: R51.9

## 2014-08-16 HISTORY — DX: Presence of cardiac pacemaker: Z95.0

## 2014-08-16 HISTORY — DX: Gastro-esophageal reflux disease without esophagitis: K21.9

## 2014-08-16 LAB — POCT I-STAT 4, (NA,K, GLUC, HGB,HCT)
GLUCOSE: 113 mg/dL — AB (ref 65–99)
HCT: 34 % — ABNORMAL LOW (ref 39.0–52.0)
HEMOGLOBIN: 11.6 g/dL — AB (ref 13.0–17.0)
Potassium: 3.9 mmol/L (ref 3.5–5.1)
SODIUM: 139 mmol/L (ref 135–145)

## 2014-08-16 SURGERY — ARTERIOVENOUS (AV) FISTULA CREATION
Anesthesia: Monitor Anesthesia Care | Site: Arm Upper | Laterality: Left

## 2014-08-16 MED ORDER — LIDOCAINE HCL (PF) 1 % IJ SOLN
INTRAMUSCULAR | Status: DC | PRN
  Administered 2014-08-16: 15 mL

## 2014-08-16 MED ORDER — FENTANYL CITRATE (PF) 100 MCG/2ML IJ SOLN: 25.0000 ug | INTRAMUSCULAR | Status: DC | PRN

## 2014-08-16 MED ORDER — 0.9 % SODIUM CHLORIDE (POUR BTL) OPTIME
TOPICAL | Status: DC | PRN
  Administered 2014-08-16: 1000 mL

## 2014-08-16 MED ORDER — HEPARIN SODIUM (PORCINE) 1000 UNIT/ML IJ SOLN
INTRAMUSCULAR | Status: AC
  Filled 2014-08-16: qty 1

## 2014-08-16 MED ORDER — SODIUM CHLORIDE 0.9 % IR SOLN
Status: DC | PRN
  Administered 2014-08-16: 500 mL

## 2014-08-16 MED ORDER — MEPERIDINE HCL 25 MG/ML IJ SOLN: 6.2500 mg | INTRAMUSCULAR | Status: DC | PRN

## 2014-08-16 MED ORDER — HYDROCODONE-ACETAMINOPHEN 10-325 MG PO TABS: 1.0000 | ORAL_TABLET | Freq: Four times a day (QID) | ORAL | Status: DC | PRN

## 2014-08-16 MED ORDER — PROTAMINE SULFATE 10 MG/ML IV SOLN
INTRAVENOUS | Status: DC | PRN
  Administered 2014-08-16: 20 mg via INTRAVENOUS
  Administered 2014-08-16: 10 mg via INTRAVENOUS
  Administered 2014-08-16: 20 mg via INTRAVENOUS

## 2014-08-16 MED ORDER — THROMBIN 20000 UNITS EX SOLR
CUTANEOUS | Status: AC
  Filled 2014-08-16: qty 20000

## 2014-08-16 MED ORDER — FENTANYL CITRATE (PF) 250 MCG/5ML IJ SOLN
INTRAMUSCULAR | Status: AC
  Filled 2014-08-16: qty 5

## 2014-08-16 MED ORDER — HEPARIN SODIUM (PORCINE) 1000 UNIT/ML IJ SOLN
INTRAMUSCULAR | Status: DC | PRN
  Administered 2014-08-16: 7000 [IU] via INTRAVENOUS

## 2014-08-16 MED ORDER — FENTANYL CITRATE (PF) 250 MCG/5ML IJ SOLN
INTRAMUSCULAR | Status: DC | PRN
  Administered 2014-08-16 (×2): 25 ug via INTRAVENOUS

## 2014-08-16 MED ORDER — LIDOCAINE HCL (PF) 1 % IJ SOLN
INTRAMUSCULAR | Status: AC
  Filled 2014-08-16: qty 30

## 2014-08-16 MED ORDER — PROMETHAZINE HCL 25 MG/ML IJ SOLN: 6.2500 mg | INTRAMUSCULAR | Status: DC | PRN

## 2014-08-16 MED ORDER — PROTAMINE SULFATE 10 MG/ML IV SOLN
INTRAVENOUS | Status: AC
  Filled 2014-08-16: qty 5

## 2014-08-16 MED ORDER — PROPOFOL INFUSION 10 MG/ML OPTIME
INTRAVENOUS | Status: DC | PRN
  Administered 2014-08-16: 50 ug/kg/min via INTRAVENOUS

## 2014-08-16 MED ORDER — PROPOFOL 10 MG/ML IV BOLUS
INTRAVENOUS | Status: AC
  Filled 2014-08-16: qty 20

## 2014-08-16 MED ORDER — SODIUM CHLORIDE 0.9 % IV SOLN
INTRAVENOUS | Status: DC
  Administered 2014-08-16: 10:00:00 via INTRAVENOUS

## 2014-08-16 MED ORDER — CHLORHEXIDINE GLUCONATE CLOTH 2 % EX PADS: 6.0000 | MEDICATED_PAD | Freq: Once | CUTANEOUS | Status: DC

## 2014-08-16 SURGICAL SUPPLY — 35 items
ARMBAND PINK RESTRICT EXTREMIT (MISCELLANEOUS) ×2 IMPLANT
CANISTER SUCTION 2500CC (MISCELLANEOUS) ×2 IMPLANT
CANNULA VESSEL 3MM 2 BLNT TIP (CANNULA) ×2 IMPLANT
CLIP TI MEDIUM 6 (CLIP) ×2 IMPLANT
CLIP TI WIDE RED SMALL 6 (CLIP) ×5 IMPLANT
COVER PROBE W GEL 5X96 (DRAPES) IMPLANT
DECANTER SPIKE VIAL GLASS SM (MISCELLANEOUS) ×2 IMPLANT
ELECT REM PT RETURN 9FT ADLT (ELECTROSURGICAL) ×2
ELECTRODE REM PT RTRN 9FT ADLT (ELECTROSURGICAL) ×1 IMPLANT
GLOVE BIO SURGEON STRL SZ 6.5 (GLOVE) ×1 IMPLANT
GLOVE BIO SURGEON STRL SZ7.5 (GLOVE) ×2 IMPLANT
GLOVE BIOGEL PI IND STRL 6.5 (GLOVE) IMPLANT
GLOVE BIOGEL PI IND STRL 8 (GLOVE) ×1 IMPLANT
GLOVE BIOGEL PI INDICATOR 6.5 (GLOVE) ×2
GLOVE BIOGEL PI INDICATOR 8 (GLOVE) ×1
GLOVE SKINSENSE NS SZ6.5 (GLOVE) ×1
GLOVE SKINSENSE STRL SZ6.5 (GLOVE) IMPLANT
GOWN BRE IMP SLV AUR XL STRL (GOWN DISPOSABLE) ×1 IMPLANT
GOWN STRL REUS W/ TWL LRG LVL3 (GOWN DISPOSABLE) ×3 IMPLANT
GOWN STRL REUS W/TWL LRG LVL3 (GOWN DISPOSABLE) ×6
KIT BASIN OR (CUSTOM PROCEDURE TRAY) ×2 IMPLANT
KIT ROOM TURNOVER OR (KITS) ×2 IMPLANT
LIQUID BAND (GAUZE/BANDAGES/DRESSINGS) ×2 IMPLANT
NS IRRIG 1000ML POUR BTL (IV SOLUTION) ×2 IMPLANT
PACK CV ACCESS (CUSTOM PROCEDURE TRAY) ×2 IMPLANT
PAD ARMBOARD 7.5X6 YLW CONV (MISCELLANEOUS) ×4 IMPLANT
SPONGE SURGIFOAM ABS GEL 100 (HEMOSTASIS) IMPLANT
SUT PROLENE 6 0 BV (SUTURE) ×2 IMPLANT
SUT SILK 3 0 (SUTURE) ×2
SUT SILK 3-0 18XBRD TIE 12 (SUTURE) IMPLANT
SUT VIC AB 3-0 SH 27 (SUTURE) ×2
SUT VIC AB 3-0 SH 27X BRD (SUTURE) ×1 IMPLANT
SUT VICRYL 4-0 PS2 18IN ABS (SUTURE) ×2 IMPLANT
UNDERPAD 30X30 INCONTINENT (UNDERPADS AND DIAPERS) ×2 IMPLANT
WATER STERILE IRR 1000ML POUR (IV SOLUTION) ×2 IMPLANT

## 2014-08-16 NOTE — Telephone Encounter (Signed)
-----   Message from Mena Goes, RN sent at 08/16/2014  1:37 PM EDT ----- Regarding: Schedule   ----- Message -----    From: Alvia Grove, PA-C    Sent: 08/16/2014   1:26 PM      To: Vvs Charge Pool  S/p left brachiocephalic AVF [id number]  F/u with Dr. Scot Dock in 6 weeks with duplex.  Thanks Maudie Mercury

## 2014-08-16 NOTE — Interval H&P Note (Signed)
History and Physical Interval Note:  08/16/2014 11:54 AM  Dale Torres  has presented today for surgery, with the diagnosis of End Stage Renal Disease N18.6  The various methods of treatment have been discussed with the patient and family. After consideration of risks, benefits and other options for treatment, the patient has consented to  Procedure(s): ARTERIOVENOUS (AV) FISTULA CREATION (Left) as a surgical intervention .  The patient's history has been reviewed, patient examined, no change in status, stable for surgery.  I have reviewed the patient's chart and labs.  Questions were answered to the patient's satisfaction.     Deitra Mayo

## 2014-08-16 NOTE — Transfer of Care (Signed)
Immediate Anesthesia Transfer of Care Note  Patient: Dale Torres  Procedure(s) Performed: Procedure(s): Left Arm creation of Brachiocephalic ARTERIOVENOUS (AV) FISTULA  (Left)  Patient Location: PACU  Anesthesia Type:MAC  Level of Consciousness: awake, alert  and oriented  Airway & Oxygen Therapy: Patient Spontanous Breathing  Post-op Assessment: Report given to RN and Post -op Vital signs reviewed and stable  Post vital signs: Reviewed and stable  Last Vitals:  Filed Vitals:   08/16/14 1349  BP:   Pulse:   Temp: 36.4 C  Resp:     Complications: No apparent anesthesia complications

## 2014-08-16 NOTE — H&P (View-Only) (Signed)
[facility] [place]  Patient name: Dale Torres MRN: [id number] DOB: 1925/07/02 Sex: male  REASON FOR CONSULT: evaluate for hemodialysis access. Referred by Dr. Lowanda Foster  HPI: Dale Torres is a 81 y.o. male who is not yet on dialysis. He was referred for evaluation for hemodialysis access.  I have reviewed the records that were sent with him from Dr. Florentina Addison office. He does have a history of congestive heart failure, anemia, and chronic kidney disease bone mineral disease. In addition he has some history of hyponatremia. His stage IV chronic kidney disease is most likely secondary to FSGS. His creatinine on 06/01/2014 was 3.71. Hemoglobin was 11.2 in March of this year.   He denies any recent uremic symptoms. Specifically he denies nausea, vomiting, fatigue, anorexia, or palpitations.  He does have a pacemaker on the right side. He is right-handed.  Past Medical History  Diagnosis Date  . Sick sinus syndrome     Status post pacemaker placement 2008  . Paroxysmal atrial fibrillation     Declines coumadin  . Essential hypertension, benign   . Benign prostatic hypertrophy   . Renal insufficiency   . Coronary atherosclerosis of native coronary artery     Nonobstructive 2009  . Anemia     Dr. Sonny Dandy  . Hypothyroidism   . CHF (congestive heart failure)    Family History  Problem Relation Age of Onset  . Coronary artery disease Father   . Heart disease Father     after age 60  . Coronary artery disease Mother   . Heart disease Mother     After age 39  . Coronary artery disease Sister   . Heart disease Sister     After age 16   SOCIAL HISTORY: History  Substance Use Topics  . Smoking status: Former Torres -- 0.80 packs/day for 12 years    Types: Cigarettes    Quit date: 01/20/1960  . Smokeless tobacco: Former Systems developer    Types: Chew    Quit date: 01/20/1980     Comment: chewed tobacco x's 10 years while playing golf only,used a pack per week  .  Alcohol Use: 0.0 oz/week    0 Standard drinks or equivalent per week     Comment: DRINKS 3-4 OZS OF SCOTCH PER WEEK   Allergies  Allergen Reactions  . Oxycodone Hcl Rash   Current Outpatient Prescriptions  Medication Sig Dispense Refill  . amiodarone (PACERONE) 200 MG tablet Take 200 mg by mouth daily.      Marland Kitchen amLODipine (NORVASC) 5 MG tablet Take 5 mg by mouth daily.    Marland Kitchen aspirin 81 MG tablet Take 81 mg by mouth daily.      Marland Kitchen atenolol (TENORMIN) 50 MG tablet Take 1 tablet (50 mg total) by mouth daily. 90 tablet 3  . calcitRIOL (ROCALTROL) 0.5 MCG capsule Take 0.5 mcg by mouth daily.    Marland Kitchen docusate sodium (COLACE) 100 MG capsule Take 100 mg by mouth as needed for constipation.    . folic acid (FOLVITE) 1 MG tablet Take 1 mg by mouth daily.      . furosemide (LASIX) 80 MG tablet Take 40-80 mg by mouth 2 (two) times daily. 80mg  every am 40mg  every pm    . gabapentin (NEURONTIN) 400 MG capsule Take 400 mg by mouth daily.      Marland Kitchen HYDROcodone-acetaminophen (NORCO) 10-325 MG per tablet Take 1 tablet by mouth every 8 (eight) hours as needed for pain.    Marland Kitchen  levothyroxine (SYNTHROID, LEVOTHROID) 112 MCG tablet Take 112 mcg by mouth daily.    . Multiple Vitamin (MULTIVITAMIN) tablet Take 1 tablet by mouth daily.      . nitroGLYCERIN (NITROSTAT) 0.4 MG SL tablet Place 1 tablet (0.4 mg total) under the tongue every 5 (five) minutes as needed. 25 tablet 2  . omeprazole (PRILOSEC) 20 MG capsule Take 20 mg by mouth daily.      . Tamsulosin HCl (FLOMAX) 0.4 MG CAPS Take 0.4 mg by mouth daily.      . Potassium 99 MG TABS Take 1 tablet by mouth daily.     No current facility-administered medications for this visit.   REVIEW OF SYSTEMS: Valu.Nieves ] denotes positive finding; [  ] denotes negative finding  CARDIOVASCULAR:  [ ]  chest pain   [ ]  chest pressure   [ ]  palpitations   [ ]  orthopnea   Valu.Nieves ] dyspnea on exertion   Valu.Nieves ] claudication   [ ]  rest pain   [ ]  DVT   [ ]  phlebitis PULMONARY:   [ ]  productive cough    [ ]  asthma   [ ]  wheezing NEUROLOGIC:   Valu.Nieves ] weakness  [ ]  paresthesias  [ ]  aphasia  [ ]  amaurosis  [ ]  dizziness HEMATOLOGIC:   [ ]  bleeding problems   [ ]  clotting disorders MUSCULOSKELETAL:  [ ]  joint pain   [ ]  joint swelling Valu.Nieves ] leg swelling GASTROINTESTINAL: [ ]   blood in stool  [ ]   hematemesis GENITOURINARY:  Valu.Nieves ]  dysuria  [ ]   hematuria PSYCHIATRIC:  [ ]  history of major depression INTEGUMENTARY:  [ ]  rashes  [ ]  ulcers CONSTITUTIONAL:  [ ]  fever   [ ]  chills  PHYSICAL EXAM: Filed Vitals:   08/01/14 1412  BP: 127/76  Pulse: 66  Temp: 97.9 F (36.6 C)  TempSrc: Oral  Resp: 16  Height: 5\' 10"  (1.778 m)  Weight: 200 lb (90.719 kg)  SpO2: 98%   GENERAL: The patient is a well-nourished male, in no acute distress. The vital signs are documented above. CARDIOVASCULAR: There is a regular rate and rhythm. I do not detect carotid bruits. He has palpable radial pulses bilaterally. PULMONARY: There is good air exchange bilaterally without wheezing or rales. ABDOMEN: Soft and non-tender with normal pitched bowel sounds.  MUSCULOSKELETAL: There are no major deformities or cyanosis. NEUROLOGIC: No focal weakness or paresthesias are detected. SKIN: There are no ulcers or rashes noted. PSYCHIATRIC: The patient has a normal affect.  DATA:  I have independently interpreted his upper extremity arterial Doppler study which shows triphasic Doppler signals in both the radial and ulnar positions bilaterally with a normal Allen test bilaterally.  I have also independently interpreted his upper extremity vein mapping which shows that he appears to have an adequate upper arm and forearm cephalic vein in both upper extremities and also an adequate basilic vein bilaterally.  MEDICAL ISSUES:  STAGE IV CHRONIC KIDNEY DISEASE: Based on his vein map, he appears to be a good candidate for a fistula in the left arm. Certainly I would try to avoid the right arm given his pacemaker on the right side.  He is also right hand dominant. I have explained the indications for placement of an AV fistula or AV graft. I've explained that if at all possible we will place an AV fistula.  I have reviewed the risks of placement of an AV fistula including but not limited to: failure of the  fistula to mature, need for subsequent interventions, and thrombosis. In addition I have reviewed the potential complications of placement of an AV graft. These risks include, but are not limited to, graft thrombosis, graft infection, wound healing problems, bleeding, arm swelling, and steal syndrome. All the patient's questions were answered and they are agreeable to proceed with surgery. His surgery is scheduled for 08/16/2014.  Deitra Mayo [facility] [place]: [phone]

## 2014-08-16 NOTE — Op Note (Signed)
    NAME: Dale Torres   MRN: [id number] DOB: 1925/12/01    DATE OF OPERATION: 08/16/2014  PREOP DIAGNOSIS: Stage IV chronic kidney disease  POSTOP DIAGNOSIS: Same  PROCEDURE: Left brachiocephalic AV fistula  SURGEON: Judeth Cornfield. Scot Dock, MD, FACS  ASSIST: Silva Bandy, [facility]  ANESTHESIA: local with sedation   EBL: minimal  INDICATIONS: Dale Torres is a 81 y.o. male he was not yet on dialysis. He presents for placement of an AV fistula.  FINDINGS: 4.5 mm cephalic vein.  TECHNIQUE: The patient was taken to the operating room and sedated by anesthesia. Her left upper extremity was prepped and draped in the usual sterile fashion. After the skin was anesthetized with lidocaine, a transverse incision was made just above the antecubital level. Here the cephalic vein was dissected free. It was fairly far lateral. This required a longer incision than normal. The vein was ligated distally and irrigated up with heparinized saline. The brachial artery had been dissected free beneath the fascia. The patient was heparinized. The brachial artery was clamped proximally and distally and a longitudinal arteriotomy was made. The vein was sewn end to side to the artery using continuous 6-0 proline suture. At the completion was an excellent thrill in the fistula. There was a palpable radial pulse. Hemostasis was obtained in the wound. The wound was closed the deep layer of 3-0 Vicryl and the skin closed with 4-0 Vicryl. Sterile dressing was applied. The patient tolerated the procedure well and was transferred to the recovery room in stable condition. All needle and sponge counts were correct.  Dale Mayo, MD, FACS [facility]  DATE OF DICTATION:   08/16/2014

## 2014-08-16 NOTE — Telephone Encounter (Signed)
Spoke with pts daughter, dpm

## 2014-08-16 NOTE — Anesthesia Postprocedure Evaluation (Signed)
  Anesthesia Post-op Note  Patient: Dale Torres  Procedure(s) Performed: Procedure(s): Left Arm creation of Brachiocephalic ARTERIOVENOUS (AV) FISTULA  (Left)  Patient Location: PACU  Anesthesia Type:MAC  Level of Consciousness: awake and alert   Airway and Oxygen Therapy: Patient Spontanous Breathing  Post-op Pain: mild  Post-op Assessment: Post-op Vital signs reviewed, Patient's Cardiovascular Status Stable, Respiratory Function Stable, Patent Airway and No signs of Nausea or vomiting              Post-op Vital Signs: Reviewed and stable  Last Vitals:  Filed Vitals:   08/16/14 1420  BP: 140/55  Pulse: 59  Temp:   Resp: 15    Complications: No apparent anesthesia complications

## 2014-08-17 ENCOUNTER — Encounter (HOSPITAL_COMMUNITY): Payer: Self-pay | Admitting: Vascular Surgery

## 2014-09-25 ENCOUNTER — Telehealth: Payer: Self-pay | Admitting: Cardiology

## 2014-09-25 ENCOUNTER — Encounter: Payer: Self-pay | Admitting: Vascular Surgery

## 2014-09-25 ENCOUNTER — Ambulatory Visit (INDEPENDENT_AMBULATORY_CARE_PROVIDER_SITE_OTHER): Payer: Medicare Other | Admitting: *Deleted

## 2014-09-25 DIAGNOSIS — I495 Sick sinus syndrome: Secondary | ICD-10-CM

## 2014-09-25 NOTE — Telephone Encounter (Signed)
LMOVM reminding pt to send remote transmission.   

## 2014-09-26 ENCOUNTER — Ambulatory Visit (INDEPENDENT_AMBULATORY_CARE_PROVIDER_SITE_OTHER): Payer: Self-pay | Admitting: Vascular Surgery

## 2014-09-26 ENCOUNTER — Ambulatory Visit (HOSPITAL_COMMUNITY)
Admission: RE | Admit: 2014-09-26 | Discharge: 2014-09-26 | Disposition: A | Discharge status: Home / Self Care | Payer: Medicare Other | Source: Ambulatory Visit | Attending: Vascular Surgery | Admitting: Vascular Surgery

## 2014-09-26 ENCOUNTER — Encounter: Payer: Self-pay | Admitting: Vascular Surgery

## 2014-09-26 VITALS — BP 135/65 | HR 71 | Temp 98.1°F | Ht 70.0 in | Wt 202.0 lb

## 2014-09-26 DIAGNOSIS — N186 End stage renal disease: Secondary | ICD-10-CM

## 2014-09-26 DIAGNOSIS — Z4931 Encounter for adequacy testing for hemodialysis: Secondary | ICD-10-CM | POA: Insufficient documentation

## 2014-09-26 NOTE — Progress Notes (Signed)
Remote pacemaker transmission.   

## 2014-09-26 NOTE — Progress Notes (Signed)
Patient name: Dale Torres MRN: [id number] DOB: 01-May-1925 Sex: male  REASON FOR VISIT: follow up after left brachiocephalic AV fistula  HPI: Dale Torres is a 81 y.o. male who had a left brachiocephalic AV fistula on [id number]. He returns for a 6 week follow up visit. He is not on dialysis. He has no pain or paresthesias in the left arm.  Current Outpatient Prescriptions  Medication Sig Dispense Refill  . amiodarone (PACERONE) 200 MG tablet Take 200 mg by mouth daily.      Marland Kitchen amLODipine (NORVASC) 5 MG tablet Take 5 mg by mouth daily.    Marland Kitchen aspirin 81 MG tablet Take 81 mg by mouth daily.      Marland Kitchen atenolol (TENORMIN) 50 MG tablet Take 1 tablet (50 mg total) by mouth daily. 90 tablet 3  . calcitRIOL (ROCALTROL) 0.5 MCG capsule Take 0.5 mcg by mouth daily.    Marland Kitchen docusate sodium (COLACE) 100 MG capsule Take 100 mg by mouth as needed for constipation.    . folic acid (FOLVITE) 1 MG tablet Take 1 mg by mouth daily.      . furosemide (LASIX) 80 MG tablet Take 80 mg by mouth 2 (two) times daily.     Marland Kitchen gabapentin (NEURONTIN) 400 MG capsule Take 400 mg by mouth daily.      Marland Kitchen HYDROcodone-acetaminophen (NORCO) 10-325 MG per tablet Take 1 tablet by mouth every 6 (six) hours as needed. 30 tablet 0  . levothyroxine (SYNTHROID, LEVOTHROID) 112 MCG tablet Take 112 mcg by mouth daily.    . Multiple Vitamin (MULTIVITAMIN) tablet Take 1 tablet by mouth daily.      . nitroGLYCERIN (NITROSTAT) 0.4 MG SL tablet Place 1 tablet (0.4 mg total) under the tongue every 5 (five) minutes as needed. (Patient taking differently: Place 0.4 mg under the tongue every 5 (five) minutes as needed for chest pain. ) 25 tablet 2  . omeprazole (PRILOSEC) 20 MG capsule Take 20 mg by mouth daily.      . Potassium 99 MG TABS Take 1 tablet by mouth daily.    . Tamsulosin HCl (FLOMAX) 0.4 MG CAPS Take 0.4 mg by mouth daily.       No current facility-administered medications for this visit.   REVIEW OF SYSTEMS: Valu.Nieves ] denotes positive  finding; [  ] denotes negative finding  CARDIOVASCULAR:  [ ]  chest pain   [ ]  dyspnea on exertion    CONSTITUTIONAL:  [ ]  fever   [ ]  chills  PHYSICAL EXAM: Filed Vitals:   09/26/14 1347  BP: 135/65  Pulse: 71  Temp: 98.1 F (36.7 C)  TempSrc: Oral  Height: 5\' 10"  (1.778 m)  Weight: 202 lb (91.627 kg)  SpO2: 98%   GENERAL: The patient is a well-nourished male, in no acute distress. The vital signs are documented above. CARDIOVASCULAR: There is a regular rate and rhythm. PULMONARY: There is good air exchange bilaterally without wheezing or rales. ACCESS: his left brachiocephalic AV fistula has an excellent bruit and thrill. His incision is healing nicely. There is some mild swelling at the antecubital level.  DUPLEX AV FISTULA: Duplex of the AV fistula shows diameters ranging from 0.32-0.64 cm.  There is a hyperplastic valve dyspnea on the anastomosis but this does not appear to be hemodynamically significant.  MEDICAL ISSUES:  STAGE IV CHRONIC KIDNEY DISEASE: His left brachiocephalic fistula appears to be maturing adequately. This should be adequate for access if it is needed. I will see him  back as needed.  Deitra Mayo [facility] [place]: [phone]

## 2014-10-05 LAB — CUP PACEART REMOTE DEVICE CHECK
Battery Impedance: 186 Ohm
Battery Remaining Longevity: 10
Brady Statistic AP VS Percent: 96 %
Date Time Interrogation Session: 20160916171646
Lead Channel Impedance Value: 459 Ohm
Lead Channel Pacing Threshold Amplitude: 0.625 V
Lead Channel Pacing Threshold Pulse Width: 0.4 ms
Lead Channel Pacing Threshold Pulse Width: 0.4 ms
Lead Channel Setting Pacing Amplitude: 2 V
Lead Channel Setting Pacing Amplitude: 2.5 V
Lead Channel Setting Sensing Sensitivity: 4 mV
MDC IDC MSMT BATTERY VOLTAGE: 2.79 V
MDC IDC MSMT LEADCHNL RA IMPEDANCE VALUE: 407 Ohm
MDC IDC MSMT LEADCHNL RA PACING THRESHOLD AMPLITUDE: 0.5 V
MDC IDC MSMT LEADCHNL RV SENSING INTR AMPL: 8 mV
MDC IDC SET LEADCHNL RV PACING PULSEWIDTH: 0.4 ms
MDC IDC STAT BRADY AP VP PERCENT: 2.7 %
MDC IDC STAT BRADY AS VP PERCENT: 0.1 % — AB
MDC IDC STAT BRADY AS VS PERCENT: 1.3 %

## 2014-10-16 ENCOUNTER — Encounter: Payer: Self-pay | Admitting: Cardiology

## 2014-10-23 ENCOUNTER — Encounter: Payer: Self-pay | Admitting: Internal Medicine

## 2014-12-24 DIAGNOSIS — Z9181 History of falling: Secondary | ICD-10-CM | POA: Diagnosis not present

## 2014-12-24 DIAGNOSIS — Z950 Presence of cardiac pacemaker: Secondary | ICD-10-CM | POA: Diagnosis not present

## 2014-12-24 DIAGNOSIS — M1712 Unilateral primary osteoarthritis, left knee: Secondary | ICD-10-CM | POA: Diagnosis not present

## 2014-12-24 DIAGNOSIS — Z992 Dependence on renal dialysis: Secondary | ICD-10-CM | POA: Diagnosis not present

## 2014-12-24 DIAGNOSIS — I480 Paroxysmal atrial fibrillation: Secondary | ICD-10-CM | POA: Diagnosis not present

## 2014-12-24 DIAGNOSIS — Z8701 Personal history of pneumonia (recurrent): Secondary | ICD-10-CM | POA: Diagnosis not present

## 2014-12-24 DIAGNOSIS — N186 End stage renal disease: Secondary | ICD-10-CM | POA: Diagnosis not present

## 2014-12-24 DIAGNOSIS — S4992XD Unspecified injury of left shoulder and upper arm, subsequent encounter: Secondary | ICD-10-CM | POA: Diagnosis not present

## 2014-12-24 DIAGNOSIS — F0390 Unspecified dementia without behavioral disturbance: Secondary | ICD-10-CM | POA: Diagnosis not present

## 2014-12-24 DIAGNOSIS — M549 Dorsalgia, unspecified: Secondary | ICD-10-CM | POA: Diagnosis not present

## 2014-12-24 DIAGNOSIS — G8929 Other chronic pain: Secondary | ICD-10-CM | POA: Diagnosis not present

## 2014-12-24 DIAGNOSIS — I120 Hypertensive chronic kidney disease with stage 5 chronic kidney disease or end stage renal disease: Secondary | ICD-10-CM | POA: Diagnosis not present

## 2014-12-26 ENCOUNTER — Ambulatory Visit (INDEPENDENT_AMBULATORY_CARE_PROVIDER_SITE_OTHER): Payer: Medicare Other | Admitting: *Deleted

## 2014-12-26 ENCOUNTER — Telehealth: Payer: Self-pay | Admitting: Cardiology

## 2014-12-26 DIAGNOSIS — I480 Paroxysmal atrial fibrillation: Secondary | ICD-10-CM | POA: Diagnosis not present

## 2014-12-26 DIAGNOSIS — I120 Hypertensive chronic kidney disease with stage 5 chronic kidney disease or end stage renal disease: Secondary | ICD-10-CM | POA: Diagnosis not present

## 2014-12-26 DIAGNOSIS — F0390 Unspecified dementia without behavioral disturbance: Secondary | ICD-10-CM | POA: Diagnosis not present

## 2014-12-26 DIAGNOSIS — I495 Sick sinus syndrome: Secondary | ICD-10-CM

## 2014-12-26 DIAGNOSIS — N186 End stage renal disease: Secondary | ICD-10-CM | POA: Diagnosis not present

## 2014-12-26 DIAGNOSIS — S4992XD Unspecified injury of left shoulder and upper arm, subsequent encounter: Secondary | ICD-10-CM | POA: Diagnosis not present

## 2014-12-26 DIAGNOSIS — M1712 Unilateral primary osteoarthritis, left knee: Secondary | ICD-10-CM | POA: Diagnosis not present

## 2014-12-26 NOTE — Telephone Encounter (Signed)
Spoke with pt and reminded pt of remote transmission that is due today. Pt verbalized understanding.   

## 2014-12-27 DIAGNOSIS — M1712 Unilateral primary osteoarthritis, left knee: Secondary | ICD-10-CM | POA: Diagnosis not present

## 2014-12-27 DIAGNOSIS — I120 Hypertensive chronic kidney disease with stage 5 chronic kidney disease or end stage renal disease: Secondary | ICD-10-CM | POA: Diagnosis not present

## 2014-12-27 DIAGNOSIS — I480 Paroxysmal atrial fibrillation: Secondary | ICD-10-CM | POA: Diagnosis not present

## 2014-12-27 DIAGNOSIS — N186 End stage renal disease: Secondary | ICD-10-CM | POA: Diagnosis not present

## 2014-12-27 DIAGNOSIS — F0390 Unspecified dementia without behavioral disturbance: Secondary | ICD-10-CM | POA: Diagnosis not present

## 2014-12-27 DIAGNOSIS — S4992XD Unspecified injury of left shoulder and upper arm, subsequent encounter: Secondary | ICD-10-CM | POA: Diagnosis not present

## 2014-12-27 NOTE — Progress Notes (Signed)
Remote pacemaker transmission.   

## 2014-12-28 DIAGNOSIS — F0390 Unspecified dementia without behavioral disturbance: Secondary | ICD-10-CM | POA: Diagnosis not present

## 2014-12-28 DIAGNOSIS — I480 Paroxysmal atrial fibrillation: Secondary | ICD-10-CM | POA: Diagnosis not present

## 2014-12-28 DIAGNOSIS — N186 End stage renal disease: Secondary | ICD-10-CM | POA: Diagnosis not present

## 2014-12-28 DIAGNOSIS — M1712 Unilateral primary osteoarthritis, left knee: Secondary | ICD-10-CM | POA: Diagnosis not present

## 2014-12-28 DIAGNOSIS — S4992XD Unspecified injury of left shoulder and upper arm, subsequent encounter: Secondary | ICD-10-CM | POA: Diagnosis not present

## 2014-12-28 DIAGNOSIS — I120 Hypertensive chronic kidney disease with stage 5 chronic kidney disease or end stage renal disease: Secondary | ICD-10-CM | POA: Diagnosis not present

## 2014-12-31 DIAGNOSIS — I120 Hypertensive chronic kidney disease with stage 5 chronic kidney disease or end stage renal disease: Secondary | ICD-10-CM | POA: Diagnosis not present

## 2014-12-31 DIAGNOSIS — S4992XD Unspecified injury of left shoulder and upper arm, subsequent encounter: Secondary | ICD-10-CM | POA: Diagnosis not present

## 2014-12-31 DIAGNOSIS — M1712 Unilateral primary osteoarthritis, left knee: Secondary | ICD-10-CM | POA: Diagnosis not present

## 2014-12-31 DIAGNOSIS — N186 End stage renal disease: Secondary | ICD-10-CM | POA: Diagnosis not present

## 2014-12-31 DIAGNOSIS — I480 Paroxysmal atrial fibrillation: Secondary | ICD-10-CM | POA: Diagnosis not present

## 2014-12-31 DIAGNOSIS — F0390 Unspecified dementia without behavioral disturbance: Secondary | ICD-10-CM | POA: Diagnosis not present

## 2015-01-02 DIAGNOSIS — I480 Paroxysmal atrial fibrillation: Secondary | ICD-10-CM | POA: Diagnosis not present

## 2015-01-02 DIAGNOSIS — M1712 Unilateral primary osteoarthritis, left knee: Secondary | ICD-10-CM | POA: Diagnosis not present

## 2015-01-02 DIAGNOSIS — N186 End stage renal disease: Secondary | ICD-10-CM | POA: Diagnosis not present

## 2015-01-02 DIAGNOSIS — F0390 Unspecified dementia without behavioral disturbance: Secondary | ICD-10-CM | POA: Diagnosis not present

## 2015-01-02 DIAGNOSIS — I120 Hypertensive chronic kidney disease with stage 5 chronic kidney disease or end stage renal disease: Secondary | ICD-10-CM | POA: Diagnosis not present

## 2015-01-02 DIAGNOSIS — S4992XD Unspecified injury of left shoulder and upper arm, subsequent encounter: Secondary | ICD-10-CM | POA: Diagnosis not present

## 2015-01-03 ENCOUNTER — Telehealth: Payer: Self-pay | Admitting: *Deleted

## 2015-01-03 NOTE — Telephone Encounter (Signed)
lmtcb/sss 

## 2015-01-04 ENCOUNTER — Telehealth: Payer: Self-pay | Admitting: *Deleted

## 2015-01-04 DIAGNOSIS — S4992XD Unspecified injury of left shoulder and upper arm, subsequent encounter: Secondary | ICD-10-CM | POA: Diagnosis not present

## 2015-01-04 DIAGNOSIS — M1712 Unilateral primary osteoarthritis, left knee: Secondary | ICD-10-CM | POA: Diagnosis not present

## 2015-01-04 DIAGNOSIS — F0390 Unspecified dementia without behavioral disturbance: Secondary | ICD-10-CM | POA: Diagnosis not present

## 2015-01-04 DIAGNOSIS — N186 End stage renal disease: Secondary | ICD-10-CM | POA: Diagnosis not present

## 2015-01-04 DIAGNOSIS — I120 Hypertensive chronic kidney disease with stage 5 chronic kidney disease or end stage renal disease: Secondary | ICD-10-CM | POA: Diagnosis not present

## 2015-01-04 DIAGNOSIS — I480 Paroxysmal atrial fibrillation: Secondary | ICD-10-CM | POA: Diagnosis not present

## 2015-01-04 LAB — CUP PACEART REMOTE DEVICE CHECK
Battery Impedance: 186 Ohm
Battery Remaining Longevity: 121 mo
Battery Voltage: 2.79 V
Brady Statistic AP VP Percent: 3 %
Brady Statistic AS VP Percent: 0 %
Date Time Interrogation Session: 20161207213837
Implantable Lead Implant Date: 20080604
Implantable Lead Location: 753859
Implantable Lead Model: 4469
Lead Channel Impedance Value: 407 Ohm
Lead Channel Pacing Threshold Amplitude: 0.625 V
Lead Channel Setting Pacing Amplitude: 2 V
Lead Channel Setting Pacing Pulse Width: 0.4 ms
MDC IDC LEAD IMPLANT DT: 20080604
MDC IDC LEAD LOCATION: 753860
MDC IDC LEAD SERIAL: 498205
MDC IDC LEAD SERIAL: 601713
MDC IDC MSMT LEADCHNL RA PACING THRESHOLD PULSEWIDTH: 0.4 ms
MDC IDC MSMT LEADCHNL RV IMPEDANCE VALUE: 440 Ohm
MDC IDC MSMT LEADCHNL RV PACING THRESHOLD AMPLITUDE: 0.5 V
MDC IDC MSMT LEADCHNL RV PACING THRESHOLD PULSEWIDTH: 0.4 ms
MDC IDC MSMT LEADCHNL RV SENSING INTR AMPL: 8 mV
MDC IDC SET LEADCHNL RV PACING AMPLITUDE: 2.5 V
MDC IDC SET LEADCHNL RV SENSING SENSITIVITY: 4 mV
MDC IDC STAT BRADY AP VS PERCENT: 90 %
MDC IDC STAT BRADY AS VS PERCENT: 8 %

## 2015-01-04 NOTE — Telephone Encounter (Signed)
Spoke to daughter regarding pt's persistent AF---see 12/17 remote. Daughter states that pt has been c/o ShOB, DOE, and LE edema. Patient was hospitalized in October for pneumonia and then again in November for CHF [facility]). Dialysis was started during the 11-2014 hospital stay--pt only voids a small amount per daughter.   Plan to notify Dr.Allred of what has been going on with patient and call patient/daughter with any further recommendations.

## 2015-01-07 DIAGNOSIS — M1712 Unilateral primary osteoarthritis, left knee: Secondary | ICD-10-CM | POA: Diagnosis not present

## 2015-01-07 DIAGNOSIS — I120 Hypertensive chronic kidney disease with stage 5 chronic kidney disease or end stage renal disease: Secondary | ICD-10-CM | POA: Diagnosis not present

## 2015-01-07 DIAGNOSIS — N186 End stage renal disease: Secondary | ICD-10-CM | POA: Diagnosis not present

## 2015-01-07 DIAGNOSIS — S4992XD Unspecified injury of left shoulder and upper arm, subsequent encounter: Secondary | ICD-10-CM | POA: Diagnosis not present

## 2015-01-07 DIAGNOSIS — I480 Paroxysmal atrial fibrillation: Secondary | ICD-10-CM | POA: Diagnosis not present

## 2015-01-07 DIAGNOSIS — F0390 Unspecified dementia without behavioral disturbance: Secondary | ICD-10-CM | POA: Diagnosis not present

## 2015-01-09 ENCOUNTER — Encounter: Payer: Self-pay | Admitting: Cardiology

## 2015-01-09 DIAGNOSIS — M1712 Unilateral primary osteoarthritis, left knee: Secondary | ICD-10-CM | POA: Diagnosis not present

## 2015-01-09 DIAGNOSIS — F0390 Unspecified dementia without behavioral disturbance: Secondary | ICD-10-CM | POA: Diagnosis not present

## 2015-01-09 DIAGNOSIS — S4992XD Unspecified injury of left shoulder and upper arm, subsequent encounter: Secondary | ICD-10-CM | POA: Diagnosis not present

## 2015-01-09 DIAGNOSIS — I120 Hypertensive chronic kidney disease with stage 5 chronic kidney disease or end stage renal disease: Secondary | ICD-10-CM | POA: Diagnosis not present

## 2015-01-09 DIAGNOSIS — N186 End stage renal disease: Secondary | ICD-10-CM | POA: Diagnosis not present

## 2015-01-09 DIAGNOSIS — I480 Paroxysmal atrial fibrillation: Secondary | ICD-10-CM | POA: Diagnosis not present

## 2015-01-09 NOTE — Telephone Encounter (Signed)
Could potentially need to have his dry weight adjusted.  He should follow-up with primary care and nephrology.  I am happy to get him in to our clinic if they feel that he has new cardiac issues.

## 2015-01-10 DIAGNOSIS — N186 End stage renal disease: Secondary | ICD-10-CM | POA: Diagnosis not present

## 2015-01-10 DIAGNOSIS — F0390 Unspecified dementia without behavioral disturbance: Secondary | ICD-10-CM | POA: Diagnosis not present

## 2015-01-10 DIAGNOSIS — S4992XD Unspecified injury of left shoulder and upper arm, subsequent encounter: Secondary | ICD-10-CM | POA: Diagnosis not present

## 2015-01-10 DIAGNOSIS — M1712 Unilateral primary osteoarthritis, left knee: Secondary | ICD-10-CM | POA: Diagnosis not present

## 2015-01-10 DIAGNOSIS — I480 Paroxysmal atrial fibrillation: Secondary | ICD-10-CM | POA: Diagnosis not present

## 2015-01-10 DIAGNOSIS — I120 Hypertensive chronic kidney disease with stage 5 chronic kidney disease or end stage renal disease: Secondary | ICD-10-CM | POA: Diagnosis not present

## 2015-01-11 DIAGNOSIS — I480 Paroxysmal atrial fibrillation: Secondary | ICD-10-CM | POA: Diagnosis not present

## 2015-01-11 DIAGNOSIS — I120 Hypertensive chronic kidney disease with stage 5 chronic kidney disease or end stage renal disease: Secondary | ICD-10-CM | POA: Diagnosis not present

## 2015-01-11 DIAGNOSIS — M1712 Unilateral primary osteoarthritis, left knee: Secondary | ICD-10-CM | POA: Diagnosis not present

## 2015-01-11 DIAGNOSIS — N186 End stage renal disease: Secondary | ICD-10-CM | POA: Diagnosis not present

## 2015-01-11 DIAGNOSIS — S4992XD Unspecified injury of left shoulder and upper arm, subsequent encounter: Secondary | ICD-10-CM | POA: Diagnosis not present

## 2015-01-11 DIAGNOSIS — F0390 Unspecified dementia without behavioral disturbance: Secondary | ICD-10-CM | POA: Diagnosis not present

## 2015-01-16 DIAGNOSIS — N186 End stage renal disease: Secondary | ICD-10-CM | POA: Diagnosis not present

## 2015-01-16 DIAGNOSIS — I120 Hypertensive chronic kidney disease with stage 5 chronic kidney disease or end stage renal disease: Secondary | ICD-10-CM | POA: Diagnosis not present

## 2015-01-16 DIAGNOSIS — I480 Paroxysmal atrial fibrillation: Secondary | ICD-10-CM | POA: Diagnosis not present

## 2015-01-16 DIAGNOSIS — F0390 Unspecified dementia without behavioral disturbance: Secondary | ICD-10-CM | POA: Diagnosis not present

## 2015-01-16 DIAGNOSIS — M1712 Unilateral primary osteoarthritis, left knee: Secondary | ICD-10-CM | POA: Diagnosis not present

## 2015-01-16 DIAGNOSIS — S4992XD Unspecified injury of left shoulder and upper arm, subsequent encounter: Secondary | ICD-10-CM | POA: Diagnosis not present

## 2015-01-22 DIAGNOSIS — Z23 Encounter for immunization: Secondary | ICD-10-CM | POA: Diagnosis not present

## 2015-01-22 DIAGNOSIS — D631 Anemia in chronic kidney disease: Secondary | ICD-10-CM | POA: Diagnosis not present

## 2015-01-22 DIAGNOSIS — Z992 Dependence on renal dialysis: Secondary | ICD-10-CM | POA: Diagnosis not present

## 2015-01-22 DIAGNOSIS — N186 End stage renal disease: Secondary | ICD-10-CM | POA: Diagnosis not present

## 2015-01-22 DIAGNOSIS — D509 Iron deficiency anemia, unspecified: Secondary | ICD-10-CM | POA: Diagnosis not present

## 2015-01-22 NOTE — Telephone Encounter (Signed)
LMTCB/SSS 

## 2015-01-23 DIAGNOSIS — F0390 Unspecified dementia without behavioral disturbance: Secondary | ICD-10-CM | POA: Diagnosis not present

## 2015-01-23 DIAGNOSIS — N186 End stage renal disease: Secondary | ICD-10-CM | POA: Diagnosis not present

## 2015-01-23 DIAGNOSIS — S4992XD Unspecified injury of left shoulder and upper arm, subsequent encounter: Secondary | ICD-10-CM | POA: Diagnosis not present

## 2015-01-23 DIAGNOSIS — M1712 Unilateral primary osteoarthritis, left knee: Secondary | ICD-10-CM | POA: Diagnosis not present

## 2015-01-23 DIAGNOSIS — I480 Paroxysmal atrial fibrillation: Secondary | ICD-10-CM | POA: Diagnosis not present

## 2015-01-23 DIAGNOSIS — I120 Hypertensive chronic kidney disease with stage 5 chronic kidney disease or end stage renal disease: Secondary | ICD-10-CM | POA: Diagnosis not present

## 2015-01-24 DIAGNOSIS — D631 Anemia in chronic kidney disease: Secondary | ICD-10-CM | POA: Diagnosis not present

## 2015-01-24 DIAGNOSIS — Z992 Dependence on renal dialysis: Secondary | ICD-10-CM | POA: Diagnosis not present

## 2015-01-24 DIAGNOSIS — Z23 Encounter for immunization: Secondary | ICD-10-CM | POA: Diagnosis not present

## 2015-01-24 DIAGNOSIS — D509 Iron deficiency anemia, unspecified: Secondary | ICD-10-CM | POA: Diagnosis not present

## 2015-01-24 DIAGNOSIS — N186 End stage renal disease: Secondary | ICD-10-CM | POA: Diagnosis not present

## 2015-01-25 ENCOUNTER — Encounter (INDEPENDENT_AMBULATORY_CARE_PROVIDER_SITE_OTHER): Payer: Medicare Other

## 2015-01-25 ENCOUNTER — Encounter: Payer: Self-pay | Admitting: *Deleted

## 2015-01-25 ENCOUNTER — Encounter: Payer: Self-pay | Admitting: Cardiology

## 2015-01-25 ENCOUNTER — Other Ambulatory Visit: Payer: Self-pay | Admitting: *Deleted

## 2015-01-25 ENCOUNTER — Ambulatory Visit (INDEPENDENT_AMBULATORY_CARE_PROVIDER_SITE_OTHER): Payer: Medicare Other | Admitting: Cardiology

## 2015-01-25 VITALS — BP 131/64 | HR 72 | Ht 70.0 in | Wt 195.0 lb

## 2015-01-25 DIAGNOSIS — D631 Anemia in chronic kidney disease: Secondary | ICD-10-CM | POA: Diagnosis not present

## 2015-01-25 DIAGNOSIS — N186 End stage renal disease: Secondary | ICD-10-CM

## 2015-01-25 DIAGNOSIS — I495 Sick sinus syndrome: Secondary | ICD-10-CM

## 2015-01-25 DIAGNOSIS — D509 Iron deficiency anemia, unspecified: Secondary | ICD-10-CM | POA: Diagnosis not present

## 2015-01-25 DIAGNOSIS — R0602 Shortness of breath: Secondary | ICD-10-CM

## 2015-01-25 DIAGNOSIS — Z992 Dependence on renal dialysis: Secondary | ICD-10-CM | POA: Diagnosis not present

## 2015-01-25 DIAGNOSIS — I4819 Other persistent atrial fibrillation: Secondary | ICD-10-CM

## 2015-01-25 DIAGNOSIS — I481 Persistent atrial fibrillation: Secondary | ICD-10-CM

## 2015-01-25 DIAGNOSIS — I10 Essential (primary) hypertension: Secondary | ICD-10-CM

## 2015-01-25 DIAGNOSIS — I2510 Atherosclerotic heart disease of native coronary artery without angina pectoris: Secondary | ICD-10-CM | POA: Diagnosis not present

## 2015-01-25 DIAGNOSIS — Z23 Encounter for immunization: Secondary | ICD-10-CM | POA: Diagnosis not present

## 2015-01-25 DIAGNOSIS — I429 Cardiomyopathy, unspecified: Secondary | ICD-10-CM

## 2015-01-25 NOTE — Patient Instructions (Signed)
Your physician recommends that you continue on your current medications as directed. Please refer to the Current Medication list given to you today. Amiodarone has been removed from your medication list. Your physician has recommended that you wear a holter monitor for 24 hours. Holter monitors are medical devices that record the heart's electrical activity. Doctors most often use these monitors to diagnose arrhythmias. Arrhythmias are problems with the speed or rhythm of the heartbeat. The monitor is a small, portable device. You can wear one while you do your normal daily activities. This is usually used to diagnose what is causing palpitations/syncope (passing out). Your physician recommends that you schedule a follow-up appointment in: 1 month.

## 2015-01-25 NOTE — Progress Notes (Signed)
Cardiology Office Note  Date: 01/25/2015   ID: FABER GELTZ, DOB [birth year], MRN [id number]  PCP: Monico Blitz, MD  Primary Cardiologist: Rozann Lesches, MD   Chief Complaint  Patient presents with  . Atrial Fibrillation    History of Present Illness: EYLAN VILLENA is an 82 y.o. male that I have not seen in the office since December 2014. He is referred back by Dr. Rayann Heman based on recent pacemaker interrogation in December 2016 demonstrating persistent atrial fibrillation since early November 2016. This was apparently around the time of exacerbated symptoms of congestive heart failure and also pneumonia.  He is here today with his daughter, present with a tablet device, seemingly taking notes throughout our interview.  She tells me that she feels like Mr. Litten is doing much better over the last 2 months. She states that he was diagnosed with hypoxic respiratory failure and pneumonia back in October 2016, subsequently in a nursing facility with progressive renal failure and initiation of hemodialysis. He did require a second hospitalization related to volume overload and congestive heart failure in this setting. She tells me that he was taken off both atenolol and amiodarone in November 2016. It sounds like he may have been having some trouble with low blood pressure and hemodialysis. Nephrologist is Dr. Lowanda Foster.   He relates chronic shortness of breath and fatigue, no major since of palpitations or chest pain however. He has not required any nitroglycerin.  He has declined anticoagulation over time with previous history of atrial fibrillation, and this remains the case today. Cardiac testing from 2014 is outlined below.  Today we discussed his persistent atrial fibrillation. At this point we will not plan to reinitiate amiodarone and adopt a strategy of heart rate control. Since he declines anticoagulation, we will not be able to pursue active attempts at restoring sinus rhythm. We did  talk about arranging a cardiac monitor to see if he has significant RVR that requires reinitiation of beta blocker. He and his daughter voiced comfort with this plan.  Past Medical History  Diagnosis Date  . Sick sinus syndrome [facility])     Status post pacemaker placement 2008  . Paroxysmal atrial fibrillation (HCC)     Declines coumadin  . Essential hypertension, benign   . Benign prostatic hypertrophy   . ESRD on hemodialysis ([place])   . Coronary atherosclerosis of native coronary artery     Nonobstructive 2009  . Anemia     Dr. Sonny Dandy  . Hypothyroidism   . HOH (hard of hearing)     Bilateral  hearing aids  . History of pneumonia   . GERD (gastroesophageal reflux disease)   . Headache   . Carpal tunnel syndrome of left wrist   . Arthritis   . Skin cancer, basal cell     Past Surgical History  Procedure Laterality Date  . Laminectomy    . Appendectomy    . Cholecystectomy    . Eye surgery    . Right rotator cuff repair    . Pacemaker placement  2008    [employer]tr[employer] - Dr. Doreatha Lew  . Pacemaker generator change  03/03/11    MDT Adaptal L implanted by Dr Rayann Heman  . Permanent pacemaker generator change N/A 03/03/2011    Procedure: PERMANENT PACEMAKER GENERATOR CHANGE;  Surgeon: Thompson Grayer, MD;  Location[facility] CATH LAB;  Service: Cardiovascular;  Laterality: N/A;  . Tonsillectomy    . Colonoscopy w/ biopsies and polypectomy    . Av fistula placement  Left 08/16/2014    Procedure: Left Arm creation of Brachiocephalic ARTERIOVENOUS (AV) FISTULA ;  Surgeon: Angelia Mould, MD;  Location: [facility] OR;  Service: Vascular;  Laterality: Left;    Current Outpatient Prescriptions  Medication Sig Dispense Refill  . aspirin 81 MG tablet Take 81 mg by mouth daily.      . calcitRIOL (ROCALTROL) 0.5 MCG capsule Take 0.5 mcg by mouth daily.    Marland Kitchen docusate sodium (COLACE) 100 MG capsule Take 100 mg by mouth as needed for constipation.    . Fluticasone Furoate-Vilanterol (BREO ELLIPTA IN) Inhale 1 puff  into the lungs daily.    . folic acid (FOLVITE) 1 MG tablet Take 1 mg by mouth daily.      . furosemide (LASIX) 80 MG tablet Take 80 mg by mouth daily.     Marland Kitchen gabapentin (NEURONTIN) 400 MG capsule Take 400 mg by mouth daily.      Marland Kitchen HYDROcodone-acetaminophen (NORCO) 10-325 MG per tablet Take 1 tablet by mouth every 6 (six) hours as needed. 30 tablet 0  . levothyroxine (SYNTHROID, LEVOTHROID) 112 MCG tablet Take 112 mcg by mouth daily.    . Multiple Vitamin (MULTIVITAMIN) tablet Take 1 tablet by mouth daily.      . nitroGLYCERIN (NITROSTAT) 0.4 MG SL tablet Place 1 tablet (0.4 mg total) under the tongue every 5 (five) minutes as needed. (Patient taking differently: Place 0.4 mg under the tongue every 5 (five) minutes as needed for chest pain. ) 25 tablet 2  . omeprazole (PRILOSEC) 20 MG capsule Take 20 mg by mouth daily.      . sevelamer carbonate (RENVELA) 800 MG tablet Take 800 mg by mouth 3 (three) times daily with meals.    . Tamsulosin HCl (FLOMAX) 0.4 MG CAPS Take 0.4 mg by mouth daily.       No current facility-administered medications for this visit.   Allergies:  Oxycodone hcl   Social History: The patient  reports that he quit smoking about 55 years ago. His smoking use included Cigarettes. He has a 9.6 pack-year smoking history. He quit smokeless tobacco use about 35 years ago. His smokeless tobacco use included Chew. He reports that he drinks alcohol. He reports that he does not use illicit drugs.   ROS:  Please see the history of present illness. Otherwise, complete review of systems is positive for chronic shortness of breath, diminished hearing.  All other systems are reviewed and negative.   Physical Exam: VS:  BP 131/64 mmHg  Pulse 72  Ht 5\' 10"  (1.778 m)  Wt 195 lb (88.451 kg)  BMI 27.98 kg/m2  SpO2 96%, BMI Body mass index is 27.98 kg/(m^2).  Wt Readings from Last 3 Encounters:  01/25/15 195 lb (88.451 kg)  09/26/14 202 lb (91.627 kg)  08/16/14 200 lb (90.719 kg)      General: Elderly male, appears comfortable at rest. HEENT: Conjunctiva and lids normal, oropharynx clear. Neck: Supple, no elevated JVP or carotid bruits, no thyromegaly. Lungs: Clear to auscultation, nonlabored breathing at rest. Cardiac: Irregularly irregular, no S3, soft systolic murmur, no pericardial rub. Abdomen: Soft, nontender, bowel sounds present. Extremities:  Mild ankle edema, distal pulses 2+. Left arm AV fistula. Skin: Warm and dry. Musculoskeletal: No kyphosis. Neuropsychiatric: Alert and oriented x3, affect grossly appropriate.  ECG: Tracing from July 2016 showed an atrial paced rhythm with left bundle-branch block.  Recent Labwork: 08/16/2014: Hemoglobin 11.6*; Potassium 3.9; Sodium 139  May 2016: BUN 59, creatinine 3.7, potassium 3.6 ,  hemoglobin 10.8  Other Studies Reviewed Today:  Lexiscan Cardiolite in June 2014 was low risk with possible inferior scar and minor peri-infarct ischemia, LVEF 43%.   Echocardiogram 06/29/2012: Study Conclusions  - Left ventricle: The cavity size was normal. There was mild concentric hypertrophy. Systolic function was mildly to moderately reduced. The estimated ejection fraction was in the range of 40% to 45%. There is hypokinesis of the basal-midinferior myocardium. There is hypokinesis of the mid-distalanteroseptal myocardium. Doppler parameters are consistent with abnormal left ventricular relaxation (grade 1 diastolic dysfunction). - Aortic valve: Trileaflet; mildly thickened leaflets. Trivial regurgitation. Mean gradient: 53mm Hg (S). Valve area: 2.67cm^2(VTI). - Mitral valve: Mild regurgitation. - Left atrium: The atrium was moderately dilated. - Right ventricle: Pacer wire or catheter noted in right ventricle. - Right atrium: Central venous pressure: 67mm Hg (est). - Tricuspid valve: Mild regurgitation. - Pulmonic valve: Mild regurgitation. - Pulmonary arteries: PA peak pressure: 72mm Hg (S). -  Pericardium, extracardiac: There was no pericardial effusion.  Assessment and Plan:  1. Persistent atrial fibrillation with prior history of PAF. Patient has declined anticoagulation over time, this remains the case today. We will therefore.strategy of heart rate control since we will not be able to make active attempts at restoring sinus rhythm. He was already taken off of amiodarone by his PCP back in November 2016. 24-hour Holter monitor will be obtained to better evaluate heart rate variability with typical activity and determine if it might need at least a low-dose beta blocker.  2. Sick sinus syndrome status post Medtronic pacemaker, followed by Dr. Rayann Heman.  3. Essential hypertension, blood pressure is adequately controlled today.  4. End-stage renal disease on hemodialysis, followed by Dr. Lowanda Foster.  5. History of nonobstructive CAD based on previous workup, no active angina symptoms.  6. History of cardiomyopathy with LVEF approximately 45%.  At this point volume status now managed by hemodialysis. He does still make some urine and is on Lasix. His weight is down 7 pounds from September.  Current medicines were reviewed with the patient today.   Orders Placed This Encounter  Procedures  . Holter monitor - 24 hour    Disposition: FU with me in 1 month.   Signed, Satira Sark, MD, [facility] 01/25/2015 11:17 AM    [facility] [place] [facility] [place], [place] [address] Phone: [phone]; Fax: [phone]

## 2015-01-28 DIAGNOSIS — I10 Essential (primary) hypertension: Secondary | ICD-10-CM | POA: Diagnosis not present

## 2015-01-28 DIAGNOSIS — I770 Arteriovenous fistula, acquired: Secondary | ICD-10-CM | POA: Diagnosis not present

## 2015-01-28 DIAGNOSIS — R05 Cough: Secondary | ICD-10-CM | POA: Diagnosis not present

## 2015-01-28 DIAGNOSIS — Z6831 Body mass index (BMI) 31.0-31.9, adult: Secondary | ICD-10-CM | POA: Diagnosis not present

## 2015-01-29 DIAGNOSIS — Z992 Dependence on renal dialysis: Secondary | ICD-10-CM | POA: Diagnosis not present

## 2015-01-29 DIAGNOSIS — Z23 Encounter for immunization: Secondary | ICD-10-CM | POA: Diagnosis not present

## 2015-01-29 DIAGNOSIS — D631 Anemia in chronic kidney disease: Secondary | ICD-10-CM | POA: Diagnosis not present

## 2015-01-29 DIAGNOSIS — D509 Iron deficiency anemia, unspecified: Secondary | ICD-10-CM | POA: Diagnosis not present

## 2015-01-29 DIAGNOSIS — N186 End stage renal disease: Secondary | ICD-10-CM | POA: Diagnosis not present

## 2015-01-31 ENCOUNTER — Telehealth: Payer: Self-pay | Admitting: *Deleted

## 2015-01-31 DIAGNOSIS — Z23 Encounter for immunization: Secondary | ICD-10-CM | POA: Diagnosis not present

## 2015-01-31 DIAGNOSIS — D509 Iron deficiency anemia, unspecified: Secondary | ICD-10-CM | POA: Diagnosis not present

## 2015-01-31 DIAGNOSIS — N186 End stage renal disease: Secondary | ICD-10-CM | POA: Diagnosis not present

## 2015-01-31 DIAGNOSIS — Z992 Dependence on renal dialysis: Secondary | ICD-10-CM | POA: Diagnosis not present

## 2015-01-31 DIAGNOSIS — D631 Anemia in chronic kidney disease: Secondary | ICD-10-CM | POA: Diagnosis not present

## 2015-01-31 NOTE — Telephone Encounter (Signed)
-----   Message from Satira Sark, MD sent at 01/31/2015 12:34 PM EST ----- Reviewed. Atrial fibrillation present as discussed recently at office visit. He does have some elevated heart rates into the 120s, possibly related to activity since mainly during daytime hours. Whether this is definitely contributing to shortness of breath is unclear, but we could consider trying to add back low-dose atenolol to his regimen. He was previously on atenolol 50 mg daily, try adding atenolol 25 mg daily. If this leads to issues with low blood pressure at hemodialysis, the medication could potentially be held on hemodialysis days or given after the session.

## 2015-02-02 DIAGNOSIS — D509 Iron deficiency anemia, unspecified: Secondary | ICD-10-CM | POA: Diagnosis not present

## 2015-02-02 DIAGNOSIS — Z992 Dependence on renal dialysis: Secondary | ICD-10-CM | POA: Diagnosis not present

## 2015-02-02 DIAGNOSIS — N186 End stage renal disease: Secondary | ICD-10-CM | POA: Diagnosis not present

## 2015-02-02 DIAGNOSIS — Z23 Encounter for immunization: Secondary | ICD-10-CM | POA: Diagnosis not present

## 2015-02-02 DIAGNOSIS — D631 Anemia in chronic kidney disease: Secondary | ICD-10-CM | POA: Diagnosis not present

## 2015-02-04 MED ORDER — ATENOLOL 25 MG PO TABS: 25.0000 mg | ORAL_TABLET | Freq: Two times a day (BID) | ORAL | Status: DC

## 2015-02-04 NOTE — Telephone Encounter (Signed)
Daughter informed and verbalized understanding of plan. 

## 2015-02-05 DIAGNOSIS — N186 End stage renal disease: Secondary | ICD-10-CM | POA: Diagnosis not present

## 2015-02-05 DIAGNOSIS — Z992 Dependence on renal dialysis: Secondary | ICD-10-CM | POA: Diagnosis not present

## 2015-02-05 DIAGNOSIS — D631 Anemia in chronic kidney disease: Secondary | ICD-10-CM | POA: Diagnosis not present

## 2015-02-05 DIAGNOSIS — Z23 Encounter for immunization: Secondary | ICD-10-CM | POA: Diagnosis not present

## 2015-02-05 DIAGNOSIS — D509 Iron deficiency anemia, unspecified: Secondary | ICD-10-CM | POA: Diagnosis not present

## 2015-02-07 DIAGNOSIS — Z992 Dependence on renal dialysis: Secondary | ICD-10-CM | POA: Diagnosis not present

## 2015-02-07 DIAGNOSIS — D509 Iron deficiency anemia, unspecified: Secondary | ICD-10-CM | POA: Diagnosis not present

## 2015-02-07 DIAGNOSIS — Z23 Encounter for immunization: Secondary | ICD-10-CM | POA: Diagnosis not present

## 2015-02-07 DIAGNOSIS — D631 Anemia in chronic kidney disease: Secondary | ICD-10-CM | POA: Diagnosis not present

## 2015-02-07 DIAGNOSIS — N186 End stage renal disease: Secondary | ICD-10-CM | POA: Diagnosis not present

## 2015-02-08 DIAGNOSIS — F0390 Unspecified dementia without behavioral disturbance: Secondary | ICD-10-CM | POA: Diagnosis not present

## 2015-02-08 DIAGNOSIS — M1712 Unilateral primary osteoarthritis, left knee: Secondary | ICD-10-CM | POA: Diagnosis not present

## 2015-02-08 DIAGNOSIS — I480 Paroxysmal atrial fibrillation: Secondary | ICD-10-CM | POA: Diagnosis not present

## 2015-02-08 DIAGNOSIS — N186 End stage renal disease: Secondary | ICD-10-CM | POA: Diagnosis not present

## 2015-02-08 DIAGNOSIS — I120 Hypertensive chronic kidney disease with stage 5 chronic kidney disease or end stage renal disease: Secondary | ICD-10-CM | POA: Diagnosis not present

## 2015-02-08 DIAGNOSIS — S4992XD Unspecified injury of left shoulder and upper arm, subsequent encounter: Secondary | ICD-10-CM | POA: Diagnosis not present

## 2015-02-09 DIAGNOSIS — Z23 Encounter for immunization: Secondary | ICD-10-CM | POA: Diagnosis not present

## 2015-02-09 DIAGNOSIS — D631 Anemia in chronic kidney disease: Secondary | ICD-10-CM | POA: Diagnosis not present

## 2015-02-09 DIAGNOSIS — Z992 Dependence on renal dialysis: Secondary | ICD-10-CM | POA: Diagnosis not present

## 2015-02-09 DIAGNOSIS — N186 End stage renal disease: Secondary | ICD-10-CM | POA: Diagnosis not present

## 2015-02-09 DIAGNOSIS — D509 Iron deficiency anemia, unspecified: Secondary | ICD-10-CM | POA: Diagnosis not present

## 2015-02-11 ENCOUNTER — Telehealth: Payer: Self-pay

## 2015-02-11 NOTE — Telephone Encounter (Addendum)
Phone call from pt's. daughter.  Reported pt. Started dialysis in November.  Reported that on Sautrday, 1/21, after dialysis, the pt's fistula appeared like it had "fallen."  Stated that the fistula had been above the elbow and appeared to be positioned horizontally, and after dialysis on Sat. the fistula appeared to be more vertical, and part of it was below the elbow.   Also reported that there appears to be a "hematoma"  The size of a tennis ball, behind the elbow.  Reported the pt. is c/o pain in the elbow.  Stated that she is concerned about him receiving dialysis tomorrow.  Phone call to [facility] in [place].  Spoke w/ nurse; reported the pt. had no problems on Saturday, during her dialysis treatment; also stated there has been a pocket of fluid surrounding the left elbow x 1 week.  Reported the symptoms to Dr. Trula Slade.  Stated no vascular study is needed @ this time.  Recommended that since dialysis treatment ran okay, there would not be any surgical treatment to do at this point.  Advised to continue with dialysis and if there is a problem, [facility] should notify us.

## 2015-02-11 NOTE — Telephone Encounter (Signed)
Phone call returned to pt's. Daughter.  Advised of Dr. Stephens Shire recommendations.  Daughter voiced concern that the fistula position has changed, and requested to have this evaluated.  Appt. offered on 02/13/15 @ 9:45 AM with the Nurse Practitioner.  Agreed.

## 2015-02-12 ENCOUNTER — Encounter: Payer: Self-pay | Admitting: Family

## 2015-02-12 DIAGNOSIS — D631 Anemia in chronic kidney disease: Secondary | ICD-10-CM | POA: Diagnosis not present

## 2015-02-12 DIAGNOSIS — D509 Iron deficiency anemia, unspecified: Secondary | ICD-10-CM | POA: Diagnosis not present

## 2015-02-12 DIAGNOSIS — Z23 Encounter for immunization: Secondary | ICD-10-CM | POA: Diagnosis not present

## 2015-02-12 DIAGNOSIS — Z992 Dependence on renal dialysis: Secondary | ICD-10-CM | POA: Diagnosis not present

## 2015-02-12 DIAGNOSIS — N186 End stage renal disease: Secondary | ICD-10-CM | POA: Diagnosis not present

## 2015-02-13 ENCOUNTER — Ambulatory Visit (INDEPENDENT_AMBULATORY_CARE_PROVIDER_SITE_OTHER): Payer: Medicare Other | Admitting: Family

## 2015-02-13 ENCOUNTER — Encounter: Payer: Self-pay | Admitting: Family

## 2015-02-13 VITALS — BP 101/52 | HR 69 | Temp 97.9°F | Resp 16 | Ht 70.0 in | Wt 200.0 lb

## 2015-02-13 DIAGNOSIS — Z992 Dependence on renal dialysis: Secondary | ICD-10-CM | POA: Diagnosis not present

## 2015-02-13 DIAGNOSIS — N186 End stage renal disease: Secondary | ICD-10-CM | POA: Diagnosis not present

## 2015-02-13 DIAGNOSIS — I2510 Atherosclerotic heart disease of native coronary artery without angina pectoris: Secondary | ICD-10-CM | POA: Diagnosis not present

## 2015-02-13 DIAGNOSIS — I770 Arteriovenous fistula, acquired: Secondary | ICD-10-CM | POA: Diagnosis not present

## 2015-02-13 NOTE — Progress Notes (Signed)
Established Dialysis Access  History of Present Illness  Dale Torres is a 82 y.o. (1925-04-27) male patient of Dr. Scot Dock who is s/p left brachiocephalic AV fistula on [id number].  He is not on dialysis. He has stage 4 CKD. He has no pain or paresthesias in the left arm. He returns today with c/o swelling and bruising around the AVF that started about 2 weeks ago. He states the swelling has improved somewhat.  He denies fever or chills. He states both of his hands feels equally cold, no apparent steal sx's in left hand or forearm.  He started hemodialysis in November 2016, per daughter. He lives with his daughter.  He states he has neuropathy in both feet, denies having DM.   He last saw Dr. Scot Dock on 09/26/14. At that time duplex of the AV fistula showed diameters ranging from 0.32-0.64 cm. There was a hyperplastic valve on the anastomosis but this did not appear to be hemodynamically significant.  His left brachiocephalic fistula appeared to be maturing adequately and should be adequate for access if it is needed.   Past Medical History  Diagnosis Date  . Sick sinus syndrome [facility])     Status post pacemaker placement 2008  . Paroxysmal atrial fibrillation (HCC)     Declines coumadin  . Essential hypertension, benign   . Benign prostatic hypertrophy   . ESRD on hemodialysis ([place])   . Coronary atherosclerosis of native coronary artery     Nonobstructive 2009  . Anemia     Dr. Sonny Dandy  . Hypothyroidism   . HOH (hard of hearing)     Bilateral  hearing aids  . History of pneumonia   . GERD (gastroesophageal reflux disease)   . Headache   . Carpal tunnel syndrome of left wrist   . Arthritis   . Skin cancer, basal cell     Social History Social History  Substance Use Topics  . Smoking status: Former Smoker -- 0.80 packs/day for 12 years    Types: Cigarettes    Quit date: 01/20/1960  . Smokeless tobacco: Former Systems developer    Types: Chew    Quit date: 01/20/1980     Comment:  chewed tobacco x's 10 years while playing golf only,used a pack per week  . Alcohol Use: 0.0 oz/week    0 Standard drinks or equivalent per week     Comment: Occasional glass of wine    Family History Family History  Problem Relation Age of Onset  . Coronary artery disease Father   . Heart disease Father     after age 87  . Coronary artery disease Mother   . Heart disease Mother     After age 68  . Coronary artery disease Sister   . Heart disease Sister     After age 41    Surgical History Past Surgical History  Procedure Laterality Date  . Laminectomy    . Appendectomy    . Cholecystectomy    . Eye surgery    . Right rotator cuff repair    . Pacemaker placement  2008    [employer]tr[employer] - Dr. Doreatha Lew  . Pacemaker generator change  03/03/11    MDT Adaptal L implanted by Dr Rayann Heman  . Permanent pacemaker generator change N/A 03/03/2011    Procedure: PERMANENT PACEMAKER GENERATOR CHANGE;  Surgeon: Thompson Grayer, MD;  Location: [facility] CATH LAB;  Service: Cardiovascular;  Laterality: N/A;  . Tonsillectomy    . Colonoscopy w/ biopsies and polypectomy    .  Av fistula placement Left 08/16/2014    Procedure: Left Arm creation of Brachiocephalic ARTERIOVENOUS (AV) FISTULA ;  Surgeon: Angelia Mould, MD;  Location: [facility];  Service: Vascular;  Laterality: Left;    Allergies  Allergen Reactions  . Oxycodone Hcl Rash    Current Outpatient Prescriptions  Medication Sig Dispense Refill  . amiodarone (PACERONE) 200 MG tablet Take 200 mg by mouth daily.    Marland Kitchen aspirin 81 MG tablet Take 81 mg by mouth daily.      Marland Kitchen atenolol (TENORMIN) 25 MG tablet Take 1 tablet (25 mg total) by mouth 2 (two) times daily. (Patient taking differently: Take 25 mg by mouth every other day. Per patients daughter patient is taking 25 mg one (1) every other day.  Patient is taking on his non dialysis days.) 90 tablet 3  . Cholecalciferol (VITAMIN D3) 1000 units CAPS Take 1,000 Units by mouth daily.    Marland Kitchen docusate  sodium (COLACE) 100 MG capsule Take 100 mg by mouth as needed for constipation.    . Fluticasone Furoate-Vilanterol (BREO ELLIPTA IN) Inhale 1 puff into the lungs daily.    . folic acid (FOLVITE) 1 MG tablet Take 1 mg by mouth daily.      . furosemide (LASIX) 80 MG tablet Take 80 mg by mouth daily.     Marland Kitchen gabapentin (NEURONTIN) 400 MG capsule Take 400 mg by mouth daily.      Marland Kitchen HYDROcodone-acetaminophen (NORCO) 10-325 MG per tablet Take 1 tablet by mouth every 6 (six) hours as needed. 30 tablet 0  . levothyroxine (SYNTHROID, LEVOTHROID) 112 MCG tablet Take 112 mcg by mouth daily.    . Multiple Vitamin (MULTIVITAMIN) tablet Take 1 tablet by mouth daily.      . nitroGLYCERIN (NITROSTAT) 0.4 MG SL tablet Place 1 tablet (0.4 mg total) under the tongue every 5 (five) minutes as needed. (Patient taking differently: Place 0.4 mg under the tongue every 5 (five) minutes as needed for chest pain. ) 25 tablet 2  . omeprazole (PRILOSEC) 20 MG capsule Take 20 mg by mouth daily.      . sevelamer carbonate (RENVELA) 800 MG tablet Take 800 mg by mouth 3 (three) times daily with meals.    . Tamsulosin HCl (FLOMAX) 0.4 MG CAPS Take 0.4 mg by mouth daily.      . calcitRIOL (ROCALTROL) 0.5 MCG capsule Take 0.5 mcg by mouth daily. Reported on 02/13/2015     No current facility-administered medications for this visit.     REVIEW OF SYSTEMS: see HPI for pertinent positives and negatives    PHYSICAL EXAMINATION:  Filed Vitals:   02/13/15 0936  BP: 101/52  Pulse: 69  Temp: 97.9 F (36.6 C)  Resp: 16  Height: 5\' 10"  (1.778 m)  Weight: 200 lb (90.719 kg)  SpO2: 97%   Body mass index is 28.7 kg/(m^2).  General: The patient appears their stated age.   HEENT:  No gross abnormalities Pulmonary: Respirations are non-labored Abdomen: Soft and non-tender. Musculoskeletal: There are no major deformities.   Neurologic: No focal weakness or paresthesias are detected Skin: There are no ulcer or rashes noted. He has  dependent edema at his left elbow. There is bruising inside the loop of the AV fistula Psychiatric: The patient has normal affect. Cardiovascular: There is a regular rate and rhythm. Left radial pulse is 1+ palpable, right radial pulse is 2+ palpable. Left upper arm AV fistula has a strong palpable thrill. His ankles have 1-2+ pitting and  non pitting edema. His bilateral DP pulses are 2+ palpable.    Medical Decision Making  Jasdeep Mckown Ramirez is a 82 y.o. male who presents with ESRD requiring hemodialysis.  Left upper arm AVF has a palpable thrill. There is bruising and dependent edema from infiltration during HD. Dr. Scot Dock spoke with pt and daughter and examined pt. HD personnel advised to access the fistula at the most distal aspect. Pt advised to elevate his left arm above his heart as much as possible to help mobilize the fluid collection from infiltration.    Follow up as needed   Sophira Rumler, Sharmon Leyden, RN, MSN, FNP-C [facility] Office: [phone]  02/13/2015, 9:51 AM  Clinic MD: Scot Dock

## 2015-02-14 DIAGNOSIS — D509 Iron deficiency anemia, unspecified: Secondary | ICD-10-CM | POA: Diagnosis not present

## 2015-02-14 DIAGNOSIS — N186 End stage renal disease: Secondary | ICD-10-CM | POA: Diagnosis not present

## 2015-02-14 DIAGNOSIS — D631 Anemia in chronic kidney disease: Secondary | ICD-10-CM | POA: Diagnosis not present

## 2015-02-14 DIAGNOSIS — Z23 Encounter for immunization: Secondary | ICD-10-CM | POA: Diagnosis not present

## 2015-02-14 DIAGNOSIS — Z992 Dependence on renal dialysis: Secondary | ICD-10-CM | POA: Diagnosis not present

## 2015-02-16 DIAGNOSIS — Z23 Encounter for immunization: Secondary | ICD-10-CM | POA: Diagnosis not present

## 2015-02-16 DIAGNOSIS — Z992 Dependence on renal dialysis: Secondary | ICD-10-CM | POA: Diagnosis not present

## 2015-02-16 DIAGNOSIS — D631 Anemia in chronic kidney disease: Secondary | ICD-10-CM | POA: Diagnosis not present

## 2015-02-16 DIAGNOSIS — D509 Iron deficiency anemia, unspecified: Secondary | ICD-10-CM | POA: Diagnosis not present

## 2015-02-16 DIAGNOSIS — N186 End stage renal disease: Secondary | ICD-10-CM | POA: Diagnosis not present

## 2015-02-19 DIAGNOSIS — D631 Anemia in chronic kidney disease: Secondary | ICD-10-CM | POA: Diagnosis not present

## 2015-02-19 DIAGNOSIS — Z992 Dependence on renal dialysis: Secondary | ICD-10-CM | POA: Diagnosis not present

## 2015-02-19 DIAGNOSIS — N186 End stage renal disease: Secondary | ICD-10-CM | POA: Diagnosis not present

## 2015-02-19 DIAGNOSIS — D509 Iron deficiency anemia, unspecified: Secondary | ICD-10-CM | POA: Diagnosis not present

## 2015-02-19 DIAGNOSIS — Z23 Encounter for immunization: Secondary | ICD-10-CM | POA: Diagnosis not present

## 2015-02-21 DIAGNOSIS — Z992 Dependence on renal dialysis: Secondary | ICD-10-CM | POA: Diagnosis not present

## 2015-02-21 DIAGNOSIS — N186 End stage renal disease: Secondary | ICD-10-CM | POA: Diagnosis not present

## 2015-02-21 DIAGNOSIS — F0390 Unspecified dementia without behavioral disturbance: Secondary | ICD-10-CM | POA: Diagnosis not present

## 2015-02-21 DIAGNOSIS — M1712 Unilateral primary osteoarthritis, left knee: Secondary | ICD-10-CM | POA: Diagnosis not present

## 2015-02-21 DIAGNOSIS — I120 Hypertensive chronic kidney disease with stage 5 chronic kidney disease or end stage renal disease: Secondary | ICD-10-CM | POA: Diagnosis not present

## 2015-02-21 DIAGNOSIS — I480 Paroxysmal atrial fibrillation: Secondary | ICD-10-CM | POA: Diagnosis not present

## 2015-02-21 DIAGNOSIS — D509 Iron deficiency anemia, unspecified: Secondary | ICD-10-CM | POA: Diagnosis not present

## 2015-02-21 DIAGNOSIS — D631 Anemia in chronic kidney disease: Secondary | ICD-10-CM | POA: Diagnosis not present

## 2015-02-21 DIAGNOSIS — S4992XD Unspecified injury of left shoulder and upper arm, subsequent encounter: Secondary | ICD-10-CM | POA: Diagnosis not present

## 2015-02-21 DIAGNOSIS — Z23 Encounter for immunization: Secondary | ICD-10-CM | POA: Diagnosis not present

## 2015-02-23 DIAGNOSIS — Z23 Encounter for immunization: Secondary | ICD-10-CM | POA: Diagnosis not present

## 2015-02-23 DIAGNOSIS — D509 Iron deficiency anemia, unspecified: Secondary | ICD-10-CM | POA: Diagnosis not present

## 2015-02-23 DIAGNOSIS — D631 Anemia in chronic kidney disease: Secondary | ICD-10-CM | POA: Diagnosis not present

## 2015-02-23 DIAGNOSIS — Z992 Dependence on renal dialysis: Secondary | ICD-10-CM | POA: Diagnosis not present

## 2015-02-23 DIAGNOSIS — N186 End stage renal disease: Secondary | ICD-10-CM | POA: Diagnosis not present

## 2015-02-25 ENCOUNTER — Ambulatory Visit (INDEPENDENT_AMBULATORY_CARE_PROVIDER_SITE_OTHER): Payer: Medicare Other | Admitting: Cardiology

## 2015-02-25 ENCOUNTER — Encounter: Payer: Self-pay | Admitting: Cardiology

## 2015-02-25 VITALS — BP 107/65 | HR 64 | Ht 70.0 in | Wt 198.2 lb

## 2015-02-25 DIAGNOSIS — I10 Essential (primary) hypertension: Secondary | ICD-10-CM

## 2015-02-25 DIAGNOSIS — I4819 Other persistent atrial fibrillation: Secondary | ICD-10-CM

## 2015-02-25 DIAGNOSIS — I2510 Atherosclerotic heart disease of native coronary artery without angina pectoris: Secondary | ICD-10-CM

## 2015-02-25 DIAGNOSIS — I481 Persistent atrial fibrillation: Secondary | ICD-10-CM | POA: Diagnosis not present

## 2015-02-25 DIAGNOSIS — I495 Sick sinus syndrome: Secondary | ICD-10-CM

## 2015-02-25 NOTE — Patient Instructions (Signed)
Your physician recommends that you continue on your current medications as directed. Please refer to the Current Medication list given to you today.  Your physician recommends that you schedule a follow-up appointment in: 4 months  

## 2015-02-25 NOTE — Progress Notes (Signed)
Cardiology Office Note  Date: 02/25/2015   ID: Dale Torres 04-12-1925, MRN [id number]  PCP: Dale Blitz, MD  Primary Cardiologist: Dale Lesches, MD   Chief Complaint  Patient presents with  . Atrial Fibrillation  . Follow-up testing    History of Present Illness: Dale Torres is an 82 y.o. male last seen in January 2017, interval history outlined in the previous note. He is here with his daughter for a follow-up visit. He does not report any major change, has daughter states that she has not noticed any decline in his status. He continues on hemodialysis. Medications now include midodrine to help with stabilizing blood pressure with dialysis. He states that this is been effective.  Recent cardiac monitor was reviewed. Atrial fibrillation was present throughout with heart rates elevated to the 120s at times. Atenolol 25 mg was initiated to assist with heart rate control.he has tolerated this addition. Although it was reported that he had been taken off of amiodarone prior to his last visit, further investigation does not find this to be the case.  Plan is to continue medical therapy and observation. They have home monitoring for his Medtronic pacemaker, and he has follow-up with Dr. Rayann Torres pending.  Past Medical History  Diagnosis Date  . Sick sinus syndrome [facility])     Status post pacemaker placement 2008  . Paroxysmal atrial fibrillation (HCC)     Declines coumadin  . Essential hypertension, benign   . Benign prostatic hypertrophy   . ESRD on hemodialysis ([place])   . Coronary atherosclerosis of native coronary artery     Nonobstructive 2009  . Anemia     Dr. Sonny Torres  . Hypothyroidism   . HOH (hard of hearing)     Bilateral  hearing aids  . History of pneumonia   . GERD (gastroesophageal reflux disease)   . Headache   . Carpal tunnel syndrome of left wrist   . Arthritis   . Skin cancer, basal cell     Current Outpatient Prescriptions  Medication Sig Dispense  Refill  . aspirin 81 MG tablet Take 81 mg by mouth daily.      Marland Kitchen atenolol (TENORMIN) 25 MG tablet Take 25 mg by mouth daily. Will take one tab every other day on non dialysis days.    . calcitRIOL (ROCALTROL) 0.5 MCG capsule Take 0.5 mcg by mouth daily. Reported on 02/13/2015    . Cholecalciferol (VITAMIN D3) 1000 units CAPS Take 1,000 Units by mouth daily.    Marland Kitchen docusate sodium (COLACE) 100 MG capsule Take 100 mg by mouth as needed for constipation.    . Fluticasone Furoate-Vilanterol (BREO ELLIPTA IN) Inhale 1 puff into the lungs daily.    . folic acid (FOLVITE) 1 MG tablet Take 1 mg by mouth daily.      . furosemide (LASIX) 80 MG tablet Take 80 mg by mouth daily.     Marland Kitchen gabapentin (NEURONTIN) 400 MG capsule Take 400 mg by mouth daily.      Marland Kitchen HYDROcodone-acetaminophen (NORCO) 10-325 MG per tablet Take 1 tablet by mouth every 6 (six) hours as needed. 30 tablet 0  . levothyroxine (SYNTHROID, LEVOTHROID) 112 MCG tablet Take 112 mcg by mouth daily.    . midodrine (PROAMATINE) 10 MG tablet Take 1 tablet by mouth 2 (two) times daily.    . Multiple Vitamin (MULTIVITAMIN) tablet Take 1 tablet by mouth daily.      . nitroGLYCERIN (NITROSTAT) 0.4 MG SL tablet Place 1 tablet (0.4  mg total) under the tongue every 5 (five) minutes as needed. (Patient taking differently: Place 0.4 mg under the tongue every 5 (five) minutes as needed for chest pain. ) 25 tablet 2  . omeprazole (PRILOSEC) 20 MG capsule Take 20 mg by mouth daily.      Marland Kitchen PACERONE 200 MG tablet Take 1 tablet by mouth daily.    . sevelamer carbonate (RENVELA) 800 MG tablet Take 800 mg by mouth 3 (three) times daily with meals.    . Tamsulosin HCl (FLOMAX) 0.4 MG CAPS Take 0.4 mg by mouth daily.       No current facility-administered medications for this visit.   Allergies:  Oxycodone hcl   Social History: The patient  reports that he quit smoking about 55 years ago. His smoking use included Cigarettes. He has a 9.6 pack-year smoking history. He  quit smokeless tobacco use about 35 years ago. His smokeless tobacco use included Chew. He reports that he drinks alcohol. He reports that he does not use illicit drugs.   ROS:  Please see the history of present illness. Otherwise, complete review of systems is positive for decreased hearing and chronic shortness of breath.  All other systems are reviewed and negative.   Physical Exam: VS:  BP 107/65 mmHg  Pulse 64  Ht 5\' 10"  (1.778 m)  Wt 198 lb 3.2 oz (89.903 kg)  BMI 28.44 kg/m2  SpO2 96%, BMI Body mass index is 28.44 kg/(m^2).  Wt Readings from Last 3 Encounters:  02/25/15 198 lb 3.2 oz (89.903 kg)  02/13/15 200 lb (90.719 kg)  01/25/15 195 lb (88.451 kg)    General: Elderly male, appears comfortable at rest. HEENT: Conjunctiva and lids normal, oropharynx clear. Neck: Supple, no elevated JVP or carotid bruits, no thyromegaly. Lungs: Clear to auscultation, nonlabored breathing at rest. Cardiac: Irregularly irregular, no S3, soft systolic murmur, no pericardial rub. Abdomen: Soft, nontender, bowel sounds present. Extremities: Mild ankle edema, distal pulses 2+. Left arm AV fistula.  ECG:  I reviewed his tracing from 11/30/2014 [facility]) which reveals atrial fibrillation with intermittent ventricular pacing, lead motion artifact noted.  Recent Labwork: 08/16/2014: Hemoglobin 11.6*; Potassium 3.9; Sodium 139  May 2016: BUN 59, creatinine 3.7, potassium 3.6 , hemoglobin 10.8  Other Studies Reviewed Today:  Lexiscan Cardiolite in June 2014 was low risk with possible inferior scar and minor peri-infarct ischemia, LVEF 43%.   Echocardiogram 06/29/2012: Study Conclusions  - Left ventricle: The cavity size was normal. There was mild concentric hypertrophy. Systolic function was mildly to moderately reduced. The estimated ejection fraction was in the range of 40% to 45%. There is hypokinesis of the basal-midinferior myocardium. There is hypokinesis of  the mid-distalanteroseptal myocardium. Doppler parameters are consistent with abnormal left ventricular relaxation (grade 1 diastolic dysfunction). - Aortic valve: Trileaflet; mildly thickened leaflets. Trivial regurgitation. Mean gradient: 28mm Hg (S). Valve area: 2.67cm^2(VTI). - Mitral valve: Mild regurgitation. - Left atrium: The atrium was moderately dilated. - Right ventricle: Pacer wire or catheter noted in right ventricle. - Right atrium: Central venous pressure: 14mm Hg (est). - Tricuspid valve: Mild regurgitation. - Pulmonic valve: Mild regurgitation. - Pulmonary arteries: PA peak pressure: 85mm Hg (S). - Pericardium, extracardiac: There was no pericardial effusion.  Assessment and Plan:  1. Paroxysmal to persistent atrial fibrillation. After further investigation of his medications, he has actually been taking amiodarone 200 mg daily, and is now back on atenolol 25 mg daily. He declines anticoagulation and is on aspirin. No changes made to current  medical regimen. He will keep follow-up with Dr. Rayann Torres. Perhaps device interrogation can shed some light on his atrial fibrillation burden. If it is substantial, might consider stopping amiodarone.  2. SIck sinus syndrome status post Medtronic pacemaker placement. Keep follow-up with Dr. Rayann Torres.  3. Essential hypertension, blood pressure is well controlled today. He is on midodrine to avoid hypotension with hemodialysis.  Current medicines were reviewed with the patient today.  Disposition: FU with me in 4 months.   Signed, Satira Sark, MD, [facility] 02/25/2015 9:14 AM    [place] [facility] [address], [place], [place] [address] Phone: [phone]; Fax: [phone]

## 2015-02-26 DIAGNOSIS — D509 Iron deficiency anemia, unspecified: Secondary | ICD-10-CM | POA: Diagnosis not present

## 2015-02-26 DIAGNOSIS — Z23 Encounter for immunization: Secondary | ICD-10-CM | POA: Diagnosis not present

## 2015-02-26 DIAGNOSIS — D631 Anemia in chronic kidney disease: Secondary | ICD-10-CM | POA: Diagnosis not present

## 2015-02-26 DIAGNOSIS — Z992 Dependence on renal dialysis: Secondary | ICD-10-CM | POA: Diagnosis not present

## 2015-02-26 DIAGNOSIS — N186 End stage renal disease: Secondary | ICD-10-CM | POA: Diagnosis not present

## 2015-02-28 DIAGNOSIS — D509 Iron deficiency anemia, unspecified: Secondary | ICD-10-CM | POA: Diagnosis not present

## 2015-02-28 DIAGNOSIS — N186 End stage renal disease: Secondary | ICD-10-CM | POA: Diagnosis not present

## 2015-02-28 DIAGNOSIS — D631 Anemia in chronic kidney disease: Secondary | ICD-10-CM | POA: Diagnosis not present

## 2015-02-28 DIAGNOSIS — Z23 Encounter for immunization: Secondary | ICD-10-CM | POA: Diagnosis not present

## 2015-02-28 DIAGNOSIS — Z992 Dependence on renal dialysis: Secondary | ICD-10-CM | POA: Diagnosis not present

## 2015-03-01 ENCOUNTER — Telehealth: Payer: Self-pay

## 2015-03-01 ENCOUNTER — Other Ambulatory Visit: Payer: Self-pay

## 2015-03-01 DIAGNOSIS — M549 Dorsalgia, unspecified: Secondary | ICD-10-CM | POA: Diagnosis not present

## 2015-03-01 DIAGNOSIS — Z87891 Personal history of nicotine dependence: Secondary | ICD-10-CM | POA: Diagnosis not present

## 2015-03-01 NOTE — Telephone Encounter (Signed)
Rec'd request for Fistulogram from [facility] in [place] due to pt's c/o "burning in access" with treatment; "possible recirculation."   Procedure scheduled for 10/04/15 @ 10:00 AM.  Tye Maryland will notify the pt.; instructions will be faxed.

## 2015-03-02 DIAGNOSIS — Z23 Encounter for immunization: Secondary | ICD-10-CM | POA: Diagnosis not present

## 2015-03-02 DIAGNOSIS — D509 Iron deficiency anemia, unspecified: Secondary | ICD-10-CM | POA: Diagnosis not present

## 2015-03-02 DIAGNOSIS — Z992 Dependence on renal dialysis: Secondary | ICD-10-CM | POA: Diagnosis not present

## 2015-03-02 DIAGNOSIS — N186 End stage renal disease: Secondary | ICD-10-CM | POA: Diagnosis not present

## 2015-03-02 DIAGNOSIS — D631 Anemia in chronic kidney disease: Secondary | ICD-10-CM | POA: Diagnosis not present

## 2015-03-05 DIAGNOSIS — N186 End stage renal disease: Secondary | ICD-10-CM | POA: Diagnosis not present

## 2015-03-05 DIAGNOSIS — Z992 Dependence on renal dialysis: Secondary | ICD-10-CM | POA: Diagnosis not present

## 2015-03-05 DIAGNOSIS — D509 Iron deficiency anemia, unspecified: Secondary | ICD-10-CM | POA: Diagnosis not present

## 2015-03-05 DIAGNOSIS — Z23 Encounter for immunization: Secondary | ICD-10-CM | POA: Diagnosis not present

## 2015-03-05 DIAGNOSIS — D631 Anemia in chronic kidney disease: Secondary | ICD-10-CM | POA: Diagnosis not present

## 2015-03-06 ENCOUNTER — Encounter (HOSPITAL_COMMUNITY)
Admission: RE | Disposition: A | Discharge status: Home / Self Care | Payer: Self-pay | Source: Ambulatory Visit | Attending: Surgery

## 2015-03-06 ENCOUNTER — Ambulatory Visit (HOSPITAL_COMMUNITY)
Admission: RE | Admit: 2015-03-06 | Discharge: 2015-03-06 | Disposition: A | Discharge status: Home / Self Care | Payer: Medicare Other | Source: Ambulatory Visit | Attending: Surgery | Admitting: Surgery

## 2015-03-06 ENCOUNTER — Encounter (HOSPITAL_COMMUNITY): Payer: Self-pay | Admitting: Surgery

## 2015-03-06 DIAGNOSIS — T82898A Other specified complication of vascular prosthetic devices, implants and grafts, initial encounter: Secondary | ICD-10-CM | POA: Insufficient documentation

## 2015-03-06 DIAGNOSIS — Y832 Surgical operation with anastomosis, bypass or graft as the cause of abnormal reaction of the patient, or of later complication, without mention of misadventure at the time of the procedure: Secondary | ICD-10-CM | POA: Insufficient documentation

## 2015-03-06 DIAGNOSIS — Z992 Dependence on renal dialysis: Secondary | ICD-10-CM | POA: Diagnosis not present

## 2015-03-06 DIAGNOSIS — N186 End stage renal disease: Secondary | ICD-10-CM | POA: Diagnosis not present

## 2015-03-06 DIAGNOSIS — Z4901 Encounter for fitting and adjustment of extracorporeal dialysis catheter: Secondary | ICD-10-CM | POA: Diagnosis present

## 2015-03-06 DIAGNOSIS — T82838A Hemorrhage of vascular prosthetic devices, implants and grafts, initial encounter: Secondary | ICD-10-CM | POA: Diagnosis not present

## 2015-03-06 HISTORY — PX: PERIPHERAL VASCULAR CATHETERIZATION: SHX172C

## 2015-03-06 LAB — POCT I-STAT, CHEM 8
BUN: 66 mg/dL — AB (ref 6–20)
CREATININE: 6.3 mg/dL — AB (ref 0.61–1.24)
Calcium, Ion: 1.04 mmol/L — ABNORMAL LOW (ref 1.13–1.30)
Chloride: 100 mmol/L — ABNORMAL LOW (ref 101–111)
GLUCOSE: 94 mg/dL (ref 65–99)
HEMATOCRIT: 36 % — AB (ref 39.0–52.0)
Hemoglobin: 12.2 g/dL — ABNORMAL LOW (ref 13.0–17.0)
POTASSIUM: 4.5 mmol/L (ref 3.5–5.1)
Sodium: 136 mmol/L (ref 135–145)
TCO2: 27 mmol/L (ref 0–100)

## 2015-03-06 SURGERY — A/V SHUNTOGRAM/FISTULAGRAM
Anesthesia: LOCAL | Laterality: Left

## 2015-03-06 MED ORDER — LIDOCAINE HCL (PF) 1 % IJ SOLN
INTRAMUSCULAR | Status: DC | PRN
  Administered 2015-03-06: 5 mL

## 2015-03-06 MED ORDER — LIDOCAINE HCL (PF) 1 % IJ SOLN
INTRAMUSCULAR | Status: AC
  Filled 2015-03-06: qty 30

## 2015-03-06 MED ORDER — HEPARIN (PORCINE) IN NACL 2-0.9 UNIT/ML-% IJ SOLN
INTRAMUSCULAR | Status: AC
  Filled 2015-03-06: qty 500

## 2015-03-06 MED ORDER — SODIUM CHLORIDE 0.9% FLUSH: 3.0000 mL | INTRAVENOUS | Status: DC | PRN

## 2015-03-06 SURGICAL SUPPLY — 11 items
BAG SNAP BAND KOVER 36X36 (MISCELLANEOUS) ×2 IMPLANT
COVER DOME SNAP 22 D (MISCELLANEOUS) ×2 IMPLANT
COVER PRB 48X5XTLSCP FOLD TPE (BAG) ×1 IMPLANT
COVER PROBE 5X48 (BAG) ×2
KIT MICROINTRODUCER STIFF 5F (SHEATH) ×2 IMPLANT
PROTECTION STATION PRESSURIZED (MISCELLANEOUS) ×2
STATION PROTECTION PRESSURIZED (MISCELLANEOUS) ×1 IMPLANT
STOPCOCK MORSE 400PSI 3WAY (MISCELLANEOUS) ×2 IMPLANT
TRAY PV CATH (CUSTOM PROCEDURE TRAY) ×2 IMPLANT
TUBING CIL FLEX 10 FLL-RA (TUBING) ×2 IMPLANT
WIRE BENTSON .035X145CM (WIRE) ×2 IMPLANT

## 2015-03-06 NOTE — Op Note (Signed)
    Patient name: Dale Torres MRN: [id number] DOB: Jul 29, 1925 Sex: male  03/06/2015 Pre-operative Diagnosis: End-stage renal disease Post-operative diagnosis:  Same Surgeon:  Annamarie Major Procedure Performed:  1.  Ultrasound-guided access, left cephalic vein   2.  Fistulogram    Indications:  The patient is having difficulty with bleeding at dialysis.  He is here for fistulogram  Procedure:  The patient was identified in the holding area and taken to room 8.  The patient was then placed supine on the table and prepped and draped in the usual sterile fashion.  A time out was called.  Ultrasound was used to evaluate the fistula.  The vein was patent and compressible.  A digital ultrasound image was acquired.  The fistula was then accessed under ultrasound guidance using a micropuncture needle.  An 018 wire was then asvanced without resistance and a micropuncture sheath was placed.  Contrast injections were then performed through the sheath.  Findings:  The arterial venous anastomosis is patent.  The cephalic vein has matured nicely in the distal upper arm however in the mid upper arm it occludes.  The central venous system does opacify from collaterals beginning at the axillary vein.  No central venous stenosis is visualized   Intervention:  None  Impression:  #1  occlusion of brachiocephalic fistula in the mid upper arm with opacification via collaterals of the axillary vein and central venous system  #2  patient will require revision of his fistula with interposition graft, versus a new access   V. Annamarie Major, M.D. [facility] Office: [phone] Pager:  [phone]

## 2015-03-06 NOTE — Discharge Instructions (Signed)
Fistulogram, Care After °Refer to this sheet in the next few weeks. These instructions provide you with information on caring for yourself after your procedure. Your health care provider may also give you more specific instructions. Your treatment has been planned according to current medical practices, but problems sometimes occur. Call your health care provider if you have any problems or questions after your procedure. °WHAT TO EXPECT AFTER THE PROCEDURE °After your procedure, it is typical to have the following: °· A small amount of discomfort in the area where the catheters were placed. °· A small amount of bruising around the fistula. °· Sleepiness and fatigue. °HOME CARE INSTRUCTIONS °· Rest at home for the day following your procedure. °· Do not drive or operate heavy machinery while taking pain medicine. °· Take medicines only as directed by your health care provider. °· Do not take baths, swim, or use a hot tub until your health care provider approves. You may shower 24 hours after the procedure or as directed by your health care provider. °· There are many different ways to close and cover an incision, including stitches, skin glue, and adhesive strips. Follow your health care provider's instructions on: °¨ Incision care. °¨ Bandage (dressing) changes and removal. °¨ Incision closure removal. °· Monitor your dialysis fistula carefully. °SEEK MEDICAL CARE IF: °· You have drainage, redness, swelling, or pain at your catheter site. °· You have a fever. °· You have chills. °SEEK IMMEDIATE MEDICAL CARE IF: °· You feel weak. °· You have trouble balancing. °· You have trouble moving your arms or legs. °· You have problems with your speech or vision. °· You can no longer feel a vibration or buzz when you put your fingers over your dialysis fistula. °· The limb that was used for the procedure: °¨ Swells. °¨ Is painful. °¨ Is cold. °¨ Is discolored, such as blue or pale white. °  °This information is not intended  to replace advice given to you by your health care provider. Make sure you discuss any questions you have with your health care provider. °  °Document Released: 05/22/2013 Document Reviewed: 05/22/2013 °Elsevier Interactive Patient Education ©2016 Elsevier Inc. ° °

## 2015-03-06 NOTE — Progress Notes (Signed)
Left upper arm dressing clean,dry,intact. Pt offers no complaints. Pt with daughter at bedside eating a sandwich, left neurovascaluar status intact.Patinent has a strong pulse at fistula site. V/S/S 144/63 Spon 97 r/a, hr60.

## 2015-03-07 DIAGNOSIS — Z992 Dependence on renal dialysis: Secondary | ICD-10-CM | POA: Diagnosis not present

## 2015-03-07 DIAGNOSIS — Z23 Encounter for immunization: Secondary | ICD-10-CM | POA: Diagnosis not present

## 2015-03-07 DIAGNOSIS — N186 End stage renal disease: Secondary | ICD-10-CM | POA: Diagnosis not present

## 2015-03-07 DIAGNOSIS — D631 Anemia in chronic kidney disease: Secondary | ICD-10-CM | POA: Diagnosis not present

## 2015-03-07 DIAGNOSIS — D509 Iron deficiency anemia, unspecified: Secondary | ICD-10-CM | POA: Diagnosis not present

## 2015-03-08 ENCOUNTER — Other Ambulatory Visit: Payer: Self-pay

## 2015-03-08 NOTE — H&P (View-Only) (Signed)
Established Dialysis Access  History of Present Illness  Dale Torres is a 82 y.o. (24-Jun-1925) male patient of Dr. Scot Dock who is s/p left brachiocephalic AV fistula on [id number].  He is not on dialysis. He has stage 4 CKD. He has no pain or paresthesias in the left arm. He returns today with c/o swelling and bruising around the AVF that started about 2 weeks ago. He states the swelling has improved somewhat.  He denies fever or chills. He states both of his hands feels equally cold, no apparent steal sx's in left hand or forearm.  He started hemodialysis in November 2016, per daughter. He lives with his daughter.  He states he has neuropathy in both feet, denies having DM.   He last saw Dr. Scot Dock on 09/26/14. At that time duplex of the AV fistula showed diameters ranging from 0.32-0.64 cm. There was a hyperplastic valve on the anastomosis but this did not appear to be hemodynamically significant.  His left brachiocephalic fistula appeared to be maturing adequately and should be adequate for access if it is needed.   Past Medical History  Diagnosis Date  . Sick sinus syndrome [facility])     Status post pacemaker placement 2008  . Paroxysmal atrial fibrillation (HCC)     Declines coumadin  . Essential hypertension, benign   . Benign prostatic hypertrophy   . ESRD on hemodialysis ([place])   . Coronary atherosclerosis of native coronary artery     Nonobstructive 2009  . Anemia     Dr. Sonny Dandy  . Hypothyroidism   . HOH (hard of hearing)     Bilateral  hearing aids  . History of pneumonia   . GERD (gastroesophageal reflux disease)   . Headache   . Carpal tunnel syndrome of left wrist   . Arthritis   . Skin cancer, basal cell     Social History Social History  Substance Use Topics  . Smoking status: Former Smoker -- 0.80 packs/day for 12 years    Types: Cigarettes    Quit date: 01/20/1960  . Smokeless tobacco: Former Systems developer    Types: Chew    Quit date: 01/20/1980     Comment:  chewed tobacco x's 10 years while playing golf only,used a pack per week  . Alcohol Use: 0.0 oz/week    0 Standard drinks or equivalent per week     Comment: Occasional glass of wine    Family History Family History  Problem Relation Age of Onset  . Coronary artery disease Father   . Heart disease Father     after age 9  . Coronary artery disease Mother   . Heart disease Mother     After age 30  . Coronary artery disease Sister   . Heart disease Sister     After age 70    Surgical History Past Surgical History  Procedure Laterality Date  . Laminectomy    . Appendectomy    . Cholecystectomy    . Eye surgery    . Right rotator cuff repair    . Pacemaker placement  2008    Medtronic - Dr. Doreatha Lew  . Pacemaker generator change  03/03/11    MDT Adaptal L implanted by Dr Rayann Heman  . Permanent pacemaker generator change N/A 03/03/2011    Procedure: PERMANENT PACEMAKER GENERATOR CHANGE;  Surgeon: Thompson Grayer, MD;  Location: [facility] CATH LAB;  Service: Cardiovascular;  Laterality: N/A;  . Tonsillectomy    . Colonoscopy w/ biopsies and polypectomy    .  Av fistula placement Left 08/16/2014    Procedure: Left Arm creation of Brachiocephalic ARTERIOVENOUS (AV) FISTULA ;  Surgeon: Angelia Mould, MD;  Location: [place];  Service: Vascular;  Laterality: Left;    Allergies  Allergen Reactions  . Oxycodone Hcl Rash    Current Outpatient Prescriptions  Medication Sig Dispense Refill  . amiodarone (PACERONE) 200 MG tablet Take 200 mg by mouth daily.    Marland Kitchen aspirin 81 MG tablet Take 81 mg by mouth daily.      Marland Kitchen atenolol (TENORMIN) 25 MG tablet Take 1 tablet (25 mg total) by mouth 2 (two) times daily. (Patient taking differently: Take 25 mg by mouth every other day. Per patients daughter patient is taking 25 mg one (1) every other day.  Patient is taking on his non dialysis days.) 90 tablet 3  . Cholecalciferol (VITAMIN D3) 1000 units CAPS Take 1,000 Units by mouth daily.    Marland Kitchen docusate  sodium (COLACE) 100 MG capsule Take 100 mg by mouth as needed for constipation.    . Fluticasone Furoate-Vilanterol (BREO ELLIPTA IN) Inhale 1 puff into the lungs daily.    . folic acid (FOLVITE) 1 MG tablet Take 1 mg by mouth daily.      . furosemide (LASIX) 80 MG tablet Take 80 mg by mouth daily.     Marland Kitchen gabapentin (NEURONTIN) 400 MG capsule Take 400 mg by mouth daily.      Marland Kitchen HYDROcodone-acetaminophen (NORCO) 10-325 MG per tablet Take 1 tablet by mouth every 6 (six) hours as needed. 30 tablet 0  . levothyroxine (SYNTHROID, LEVOTHROID) 112 MCG tablet Take 112 mcg by mouth daily.    . Multiple Vitamin (MULTIVITAMIN) tablet Take 1 tablet by mouth daily.      . nitroGLYCERIN (NITROSTAT) 0.4 MG SL tablet Place 1 tablet (0.4 mg total) under the tongue every 5 (five) minutes as needed. (Patient taking differently: Place 0.4 mg under the tongue every 5 (five) minutes as needed for chest pain. ) 25 tablet 2  . omeprazole (PRILOSEC) 20 MG capsule Take 20 mg by mouth daily.      . sevelamer carbonate (RENVELA) 800 MG tablet Take 800 mg by mouth 3 (three) times daily with meals.    . Tamsulosin HCl (FLOMAX) 0.4 MG CAPS Take 0.4 mg by mouth daily.      . calcitRIOL (ROCALTROL) 0.5 MCG capsule Take 0.5 mcg by mouth daily. Reported on 02/13/2015     No current facility-administered medications for this visit.     REVIEW OF SYSTEMS: see HPI for pertinent positives and negatives    PHYSICAL EXAMINATION:  Filed Vitals:   02/13/15 0936  BP: 101/52  Pulse: 69  Temp: 97.9 F (36.6 C)  Resp: 16  Height: 5\' 10"  (1.778 m)  Weight: 200 lb (90.719 kg)  SpO2: 97%   Body mass index is 28.7 kg/(m^2).  General: The patient appears their stated age.   HEENT:  No gross abnormalities Pulmonary: Respirations are non-labored Abdomen: Soft and non-tender. Musculoskeletal: There are no major deformities.   Neurologic: No focal weakness or paresthesias are detected Skin: There are no ulcer or rashes noted. He has  dependent edema at his left elbow. There is bruising inside the loop of the AV fistula Psychiatric: The patient has normal affect. Cardiovascular: There is a regular rate and rhythm. Left radial pulse is 1+ palpable, right radial pulse is 2+ palpable. Left upper arm AV fistula has a strong palpable thrill. His ankles have 1-2+ pitting and  non pitting edema. His bilateral DP pulses are 2+ palpable.    Medical Decision Making  Algie Bibb Daniel is a 82 y.o. male who presents with ESRD requiring hemodialysis.  Left upper arm AVF has a palpable thrill. There is bruising and dependent edema from infiltration during HD. Dr. Scot Dock spoke with pt and daughter and examined pt. HD personnel advised to access the fistula at the most distal aspect. Pt advised to elevate his left arm above his heart as much as possible to help mobilize the fluid collection from infiltration.    Follow up as needed   Ainsley Deakins, Sharmon Leyden, RN, MSN, FNP-C [facility] Office: [phone]  02/13/2015, 9:51 AM  Clinic MD: Scot Dock

## 2015-03-08 NOTE — Interval H&P Note (Signed)
History and Physical Interval Note:  03/08/2015 2:16 PM  Dale Torres  has presented today for surgery, with the diagnosis of End stage renal  The various methods of treatment have been discussed with the patient and family. After consideration of risks, benefits and other options for treatment, the patient has consented to  Procedure(s): Fistulagram (Left) as a surgical intervention .  The patient's history has been reviewed, patient examined, no change in status, stable for surgery.  I have reviewed the patient's chart and labs.  Questions were answered to the patient's satisfaction.     Annamarie Major

## 2015-03-11 ENCOUNTER — Ambulatory Visit (HOSPITAL_COMMUNITY): Payer: Medicare Other | Admitting: Anesthesiology

## 2015-03-11 ENCOUNTER — Encounter (HOSPITAL_COMMUNITY)
Admission: AD | Disposition: A | Discharge status: Home / Self Care | Payer: Self-pay | Source: Ambulatory Visit | Attending: Vascular Surgery

## 2015-03-11 ENCOUNTER — Encounter (HOSPITAL_COMMUNITY): Payer: Self-pay | Admitting: *Deleted

## 2015-03-11 ENCOUNTER — Ambulatory Visit (HOSPITAL_COMMUNITY)
Admission: AD | Admit: 2015-03-11 | Discharge: 2015-03-11 | Disposition: A | Discharge status: Home / Self Care | Payer: Medicare Other | Source: Ambulatory Visit | Attending: Vascular Surgery | Admitting: Vascular Surgery

## 2015-03-11 DIAGNOSIS — M1990 Unspecified osteoarthritis, unspecified site: Secondary | ICD-10-CM | POA: Insufficient documentation

## 2015-03-11 DIAGNOSIS — I480 Paroxysmal atrial fibrillation: Secondary | ICD-10-CM | POA: Diagnosis not present

## 2015-03-11 DIAGNOSIS — K219 Gastro-esophageal reflux disease without esophagitis: Secondary | ICD-10-CM | POA: Diagnosis not present

## 2015-03-11 DIAGNOSIS — Z7982 Long term (current) use of aspirin: Secondary | ICD-10-CM | POA: Insufficient documentation

## 2015-03-11 DIAGNOSIS — I120 Hypertensive chronic kidney disease with stage 5 chronic kidney disease or end stage renal disease: Secondary | ICD-10-CM | POA: Insufficient documentation

## 2015-03-11 DIAGNOSIS — Y832 Surgical operation with anastomosis, bypass or graft as the cause of abnormal reaction of the patient, or of later complication, without mention of misadventure at the time of the procedure: Secondary | ICD-10-CM | POA: Diagnosis not present

## 2015-03-11 DIAGNOSIS — N400 Enlarged prostate without lower urinary tract symptoms: Secondary | ICD-10-CM | POA: Diagnosis not present

## 2015-03-11 DIAGNOSIS — I2510 Atherosclerotic heart disease of native coronary artery without angina pectoris: Secondary | ICD-10-CM | POA: Diagnosis not present

## 2015-03-11 DIAGNOSIS — I495 Sick sinus syndrome: Secondary | ICD-10-CM | POA: Diagnosis not present

## 2015-03-11 DIAGNOSIS — Z992 Dependence on renal dialysis: Secondary | ICD-10-CM | POA: Diagnosis not present

## 2015-03-11 DIAGNOSIS — D649 Anemia, unspecified: Secondary | ICD-10-CM | POA: Insufficient documentation

## 2015-03-11 DIAGNOSIS — N186 End stage renal disease: Secondary | ICD-10-CM | POA: Insufficient documentation

## 2015-03-11 DIAGNOSIS — Z7951 Long term (current) use of inhaled steroids: Secondary | ICD-10-CM | POA: Insufficient documentation

## 2015-03-11 DIAGNOSIS — Z79899 Other long term (current) drug therapy: Secondary | ICD-10-CM | POA: Diagnosis not present

## 2015-03-11 DIAGNOSIS — Z87891 Personal history of nicotine dependence: Secondary | ICD-10-CM | POA: Diagnosis not present

## 2015-03-11 DIAGNOSIS — T82868A Thrombosis of vascular prosthetic devices, implants and grafts, initial encounter: Secondary | ICD-10-CM | POA: Insufficient documentation

## 2015-03-11 DIAGNOSIS — Z950 Presence of cardiac pacemaker: Secondary | ICD-10-CM | POA: Diagnosis not present

## 2015-03-11 DIAGNOSIS — E039 Hypothyroidism, unspecified: Secondary | ICD-10-CM | POA: Diagnosis not present

## 2015-03-11 DIAGNOSIS — Z85828 Personal history of other malignant neoplasm of skin: Secondary | ICD-10-CM | POA: Diagnosis not present

## 2015-03-11 HISTORY — PX: THROMBECTOMY AND REVISION OF ARTERIOVENTOUS (AV) GORETEX  GRAFT: SHX6120

## 2015-03-11 LAB — POCT I-STAT 4, (NA,K, GLUC, HGB,HCT)
Glucose, Bld: 96 mg/dL (ref 65–99)
HCT: 36 % — ABNORMAL LOW (ref 39.0–52.0)
HEMOGLOBIN: 12.2 g/dL — AB (ref 13.0–17.0)
POTASSIUM: 4.3 mmol/L (ref 3.5–5.1)
Sodium: 141 mmol/L (ref 135–145)

## 2015-03-11 SURGERY — THROMBECTOMY AND REVISION OF ARTERIOVENTOUS (AV) GORETEX  GRAFT
Anesthesia: General | Laterality: Left

## 2015-03-11 MED ORDER — STERILE WATER FOR IRRIGATION IR SOLN
Status: DC | PRN
  Administered 2015-03-11: 1000 mL

## 2015-03-11 MED ORDER — NEOSTIGMINE METHYLSULFATE 10 MG/10ML IV SOLN
INTRAVENOUS | Status: DC | PRN
  Administered 2015-03-11: 3 mg via INTRAVENOUS

## 2015-03-11 MED ORDER — HYDROCODONE-ACETAMINOPHEN 10-325 MG PO TABS
1.0000 | ORAL_TABLET | Freq: Once | ORAL | Status: DC
  Filled 2015-03-11: qty 1

## 2015-03-11 MED ORDER — ONDANSETRON HCL 4 MG/2ML IJ SOLN
INTRAMUSCULAR | Status: DC | PRN
  Administered 2015-03-11: 4 mg via INTRAVENOUS

## 2015-03-11 MED ORDER — SODIUM CHLORIDE 0.9 % IV SOLN
INTRAVENOUS | Status: DC | PRN
  Administered 2015-03-11: 10:00:00 via INTRAVENOUS

## 2015-03-11 MED ORDER — GLYCOPYRROLATE 0.2 MG/ML IJ SOLN
INTRAMUSCULAR | Status: AC
  Filled 2015-03-11: qty 2

## 2015-03-11 MED ORDER — PHENYLEPHRINE 40 MCG/ML (10ML) SYRINGE FOR IV PUSH (FOR BLOOD PRESSURE SUPPORT)
PREFILLED_SYRINGE | INTRAVENOUS | Status: AC
  Filled 2015-03-11: qty 10

## 2015-03-11 MED ORDER — FENTANYL CITRATE (PF) 250 MCG/5ML IJ SOLN
INTRAMUSCULAR | Status: AC
  Filled 2015-03-11: qty 5

## 2015-03-11 MED ORDER — PHENYLEPHRINE HCL 10 MG/ML IJ SOLN
INTRAMUSCULAR | Status: DC | PRN
  Administered 2015-03-11: 80 ug via INTRAVENOUS

## 2015-03-11 MED ORDER — DEXTROSE 5 % IV SOLN
1.5000 g | INTRAVENOUS | Status: AC
  Administered 2015-03-11: 1.5 g via INTRAVENOUS
  Filled 2015-03-11: qty 1.5

## 2015-03-11 MED ORDER — HYDROCODONE-ACETAMINOPHEN 10-325 MG PO TABS: 1.0000 | ORAL_TABLET | Freq: Four times a day (QID) | ORAL | Status: DC | PRN

## 2015-03-11 MED ORDER — GLYCOPYRROLATE 0.2 MG/ML IJ SOLN
INTRAMUSCULAR | Status: DC | PRN
  Administered 2015-03-11: 0.4 mg via INTRAVENOUS

## 2015-03-11 MED ORDER — SODIUM CHLORIDE 0.9 % IV SOLN
INTRAVENOUS | Status: DC | PRN
  Administered 2015-03-11: 12:00:00

## 2015-03-11 MED ORDER — PROPOFOL 10 MG/ML IV BOLUS
INTRAVENOUS | Status: DC | PRN
  Administered 2015-03-11: 50 mg via INTRAVENOUS

## 2015-03-11 MED ORDER — ROCURONIUM BROMIDE 100 MG/10ML IV SOLN
INTRAVENOUS | Status: DC | PRN
  Administered 2015-03-11: 30 mg via INTRAVENOUS

## 2015-03-11 MED ORDER — ONDANSETRON HCL 4 MG/2ML IJ SOLN
INTRAMUSCULAR | Status: AC
  Filled 2015-03-11: qty 2

## 2015-03-11 MED ORDER — FENTANYL CITRATE (PF) 100 MCG/2ML IJ SOLN
INTRAMUSCULAR | Status: DC | PRN
  Administered 2015-03-11: 50 ug via INTRAVENOUS

## 2015-03-11 MED ORDER — PHENYLEPHRINE HCL 10 MG/ML IJ SOLN
10.0000 mg | INTRAVENOUS | Status: DC | PRN
  Administered 2015-03-11: 10 ug/min via INTRAVENOUS

## 2015-03-11 MED ORDER — 0.9 % SODIUM CHLORIDE (POUR BTL) OPTIME
TOPICAL | Status: DC | PRN
  Administered 2015-03-11: 1000 mL

## 2015-03-11 MED ORDER — HEPARIN SODIUM (PORCINE) 1000 UNIT/ML IJ SOLN
INTRAMUSCULAR | Status: AC
  Filled 2015-03-11: qty 1

## 2015-03-11 MED ORDER — FENTANYL CITRATE (PF) 100 MCG/2ML IJ SOLN: 25.0000 ug | INTRAMUSCULAR | Status: DC | PRN

## 2015-03-11 MED ORDER — LIDOCAINE HCL (CARDIAC) 20 MG/ML IV SOLN
INTRAVENOUS | Status: DC | PRN
  Administered 2015-03-11: 60 mg via INTRAVENOUS

## 2015-03-11 MED ORDER — SODIUM CHLORIDE 0.9 % IV SOLN
Freq: Once | INTRAVENOUS | Status: AC
  Administered 2015-03-11: 11:00:00 via INTRAVENOUS

## 2015-03-11 SURGICAL SUPPLY — 43 items
APL SKNCLS STERI-STRIP NONHPOA (GAUZE/BANDAGES/DRESSINGS) ×1
ARMBAND PINK RESTRICT EXTREMIT (MISCELLANEOUS) ×2 IMPLANT
BENZOIN TINCTURE PRP APPL 2/3 (GAUZE/BANDAGES/DRESSINGS) ×2 IMPLANT
CANISTER SUCTION 2500CC (MISCELLANEOUS) ×2 IMPLANT
CANNULA VESSEL 3MM 2 BLNT TIP (CANNULA) ×1 IMPLANT
CATH EMB 4FR 80CM (CATHETERS) ×2 IMPLANT
CLIP LIGATING EXTRA MED SLVR (CLIP) ×2 IMPLANT
CLIP LIGATING EXTRA SM BLUE (MISCELLANEOUS) ×2 IMPLANT
CLSR STERI-STRIP ANTIMIC 1/2X4 (GAUZE/BANDAGES/DRESSINGS) ×1 IMPLANT
COVER PROBE W GEL 5X96 (DRAPES) ×1 IMPLANT
DECANTER SPIKE VIAL GLASS SM (MISCELLANEOUS) ×2 IMPLANT
ELECT REM PT RETURN 9FT ADLT (ELECTROSURGICAL) ×2
ELECTRODE REM PT RTRN 9FT ADLT (ELECTROSURGICAL) ×1 IMPLANT
GAUZE SPONGE 4X4 12PLY STRL (GAUZE/BANDAGES/DRESSINGS) ×1 IMPLANT
GEL ULTRASOUND 20GR AQUASONIC (MISCELLANEOUS) IMPLANT
GLOVE BIOGEL PI IND STRL 6.5 (GLOVE) IMPLANT
GLOVE BIOGEL PI IND STRL 7.0 (GLOVE) IMPLANT
GLOVE BIOGEL PI INDICATOR 6.5 (GLOVE) ×1
GLOVE BIOGEL PI INDICATOR 7.0 (GLOVE) ×1
GLOVE ECLIPSE 7.0 STRL STRAW (GLOVE) ×1 IMPLANT
GLOVE ECLIPSE 8.0 STRL XLNG CF (GLOVE) ×1 IMPLANT
GLOVE SS BIOGEL STRL SZ 7.5 (GLOVE) ×1 IMPLANT
GLOVE SUPERSENSE BIOGEL SZ 7.5 (GLOVE) ×1
GLOVE SURG SS PI 6.5 STRL IVOR (GLOVE) ×1 IMPLANT
GOWN STRL REUS W/ TWL LRG LVL3 (GOWN DISPOSABLE) ×3 IMPLANT
GOWN STRL REUS W/TWL LRG LVL3 (GOWN DISPOSABLE) ×6
GOWN STRL REUS W/TWL XL LVL3 (GOWN DISPOSABLE) ×1 IMPLANT
GRAFT GORETEX STND 6X20 (Vascular Products) ×2 IMPLANT
GRAFT GORETEXSTD 6X20 (Vascular Products) IMPLANT
KIT BASIN OR (CUSTOM PROCEDURE TRAY) ×2 IMPLANT
KIT ROOM TURNOVER OR (KITS) ×2 IMPLANT
NS IRRIG 1000ML POUR BTL (IV SOLUTION) ×2 IMPLANT
PACK CV ACCESS (CUSTOM PROCEDURE TRAY) ×2 IMPLANT
PAD ARMBOARD 7.5X6 YLW CONV (MISCELLANEOUS) ×4 IMPLANT
SPONGE GAUZE 4X4 12PLY STER LF (GAUZE/BANDAGES/DRESSINGS) ×1 IMPLANT
STRIP CLOSURE SKIN 1/2X4 (GAUZE/BANDAGES/DRESSINGS) ×1 IMPLANT
SUT PROLENE 5 0 C 1 24 (SUTURE) ×2 IMPLANT
SUT PROLENE 6 0 CC (SUTURE) ×3 IMPLANT
SUT VIC AB 3-0 SH 27 (SUTURE) ×2
SUT VIC AB 3-0 SH 27X BRD (SUTURE) ×1 IMPLANT
TAPE CLOTH SURG 4X10 WHT LF (GAUZE/BANDAGES/DRESSINGS) ×1 IMPLANT
UNDERPAD 30X30 INCONTINENT (UNDERPADS AND DIAPERS) ×2 IMPLANT
WATER STERILE IRR 1000ML POUR (IV SOLUTION) ×2 IMPLANT

## 2015-03-11 NOTE — Anesthesia Procedure Notes (Signed)
Procedure Name: Intubation Date/Time: 03/11/2015 12:15 PM Performed by: Garrison Columbus T Pre-anesthesia Checklist: Patient identified, Emergency Drugs available, Suction available and Patient being monitored Patient Re-evaluated:Patient Re-evaluated prior to inductionOxygen Delivery Method: Circle system utilized Preoxygenation: Pre-oxygenation with 100% oxygen Intubation Type: IV induction Ventilation: Mask ventilation without difficulty and Oral airway inserted - appropriate to patient size Laryngoscope Size: Sabra Heck and 2 Grade View: Grade I Tube type: Oral Tube size: 7.5 mm Number of attempts: 1 Airway Equipment and Method: Stylet and Oral airway Placement Confirmation: ETT inserted through vocal cords under direct vision,  positive ETCO2 and breath sounds checked- equal and bilateral Secured at: 22 cm Tube secured with: Tape Dental Injury: Teeth and Oropharynx as per pre-operative assessment

## 2015-03-11 NOTE — Anesthesia Preprocedure Evaluation (Addendum)
Anesthesia Evaluation  Patient identified by MRN, date of birth, ID band Patient awake    Reviewed: Allergy & Precautions, H&P , NPO status , Patient's Chart, lab work & pertinent test results  Airway Mallampati: II  TM Distance: >3 FB Neck ROM: Full    Dental no notable dental hx. (+) Partial Upper, Partial Lower, Dental Advisory Given   Pulmonary neg pulmonary ROS, former smoker,    Pulmonary exam normal breath sounds clear to auscultation       Cardiovascular hypertension, Pt. on medications and Pt. on home beta blockers + CAD  + dysrhythmias Atrial Fibrillation + pacemaker  Rhythm:Regular Rate:Normal     Neuro/Psych  Headaches, negative psych ROS   GI/Hepatic Neg liver ROS, GERD  Medicated and Controlled,  Endo/Other  Hypothyroidism   Renal/GU ESRF and DialysisRenal disease  negative genitourinary   Musculoskeletal  (+) Arthritis , Osteoarthritis,    Abdominal   Peds  Hematology negative hematology ROS (+)   Anesthesia Other Findings   Reproductive/Obstetrics negative OB ROS                            Anesthesia Physical Anesthesia Plan  ASA: III  Anesthesia Plan: General   Post-op Pain Management:    Induction: Intravenous  Airway Management Planned: Oral ETT  Additional Equipment:   Intra-op Plan:   Post-operative Plan: Extubation in OR  Informed Consent: I have reviewed the patients History and Physical, chart, labs and discussed the procedure including the risks, benefits and alternatives for the proposed anesthesia with the patient or authorized representative who has indicated his/her understanding and acceptance.   Dental advisory given  Plan Discussed with: CRNA  Anesthesia Plan Comments:        Anesthesia Quick Evaluation

## 2015-03-11 NOTE — Progress Notes (Signed)
Spoke with Dr. Ola Spurr, made aware of Medtronic pacemaker.  Asked to call [employer].   Spoke to [place] with [employer] and she is aware of pt's surgery time and available if needed.

## 2015-03-11 NOTE — Op Note (Signed)
    OPERATIVE REPORT  DATE OF SURGERY: 03/11/2015  PATIENT: Dale Torres, 82 y.o. male MRN: [id number]  DOB: [birth year]  PRE-OPERATIVE DIAGNOSIS:  End-stage renal disease with thrombosed left upper arm AV fistula  POST-OPERATIVE DIAGNOSIS:  Same  PROCEDURE:  Thrombectomy and revision with replacement of the proximal  Fistula 6 mm Gore-Tex graft anastomosed to the axillary vein  SURGEON:  Curt Jews, M.D.  PHYSICIAN ASSISTANT:  Samantha Rhyne PA-C  ANESTHESIA:   Gen.  EBL:  Less than 100 ml  Total I/O In: 400 [I.V.:400] Out: 100 [Blood:100]  BLOOD ADMINISTERED:  none  DRAINS:  none  SPECIMEN:  None none  COUNTS CORRECT:  YES  PLAN OF CARE:  PACU   PATIENT DISPOSITION:  PACU - hemodynamically stable  PROCEDURE DETAILS:  patient was taken to the operating room placed supine position where the area of the left arm left until reprepped and draped in sterile fashion. SonoSite ultrasound was used to visualize the fistula. The patient was nicely developed from the antecubital space the arteriovenous anastomosis to mid forearm. He had a recent sonogram showing fistula is nicely developed. There was thrombosis proximally with filling only via collaterals. Patient had a large hematoma at this level as well as the mid upper arm. Incision was made over this and carried down to isolate the hematoma and this was a chronic and evacuated. The vein above at the 4 Fogarty placed through this is very sclerotic. The vein from this level to the antecubital space was of good caliber. The vein was thrombectomized with a 4 and 5 Fogarty catheter. Catheter passed through the arteriovenous anastomosis. There was excellent inflow. No further clot was removed this was flushed with heparinized saline and reoccluded. Separate incision was made over the axilla and carried down to isolate the axillary vein which was of good caliber. A tunnel was created between the level of the thrombectomy site of mid  upper arm fistula and the axilla and a 6 mm standard wall graft was brought through the tunnel. The proximal segment of the cephalic vein was oversewn with 5-0 Prolene suture. The new interposition graft Gore-Tex was sewn into in to the cephalic vein fistula with a running 5-0 Prolene suture. This anastomosis was tested and found to be adequate. The graft was flushed heparinized and reoccluded. Next the axillary vein was occluded proximally and distally and was opened with 11 blade and sent longitudinally with Potts scissors. The interposition graft was cut to appropriate length and was spatulated and sewn end-to-side to the vein with a running 6-0 Prolene suture. Clamps were removed and excellent thrill was noted. Wounds irrigated with saline. Hemostasis tablet cautery. Wounds were closed with 3-0 Vicryl in the subcutaneous and subcuticular tissue. Benzoin and Steri-Strips were applied. The patient was transferred to the recovery room in stable condition   Curt Jews, M.D. 03/11/2015 2:05 PM

## 2015-03-11 NOTE — H&P (Signed)
Patient name: Dale Torres MRN: [id number] DOB: [birth year] Sex: male     HISTORY OF PRESENT ILLNESS: 82 year old gentleman with end-stage renal disease. He had a left upper arm fistula placed by Dr. Scot Dock in the past. He was not on hemodialysis until approximately 2 months ago. That time he suffered infiltration. He was having poor flow and therefore underwent a shuntogram approximately 1 week ago. This showed patency proximal fistula with occlusion of the fistula towards the area of the axilla. There was reconstitution of axillary vein. He had been posted for surgery later this week elective revision from his fistula with prosthetic graft to the axillary vein. I received a call from the hemodialysis center this morning stating that this was clotted and was unable to undergo hemodialysis. He was nothing by mouth and his been brought to the hospital today for bacteremia and revision.  Past Medical History  Diagnosis Date  . Sick sinus syndrome [facility])     Status post pacemaker placement 2008  . Paroxysmal atrial fibrillation (HCC)     Declines coumadin  . Essential hypertension, benign   . Benign prostatic hypertrophy   . ESRD on hemodialysis ([place])   . Coronary atherosclerosis of native coronary artery     Nonobstructive 2009  . Anemia     Dr. Sonny Dandy  . Hypothyroidism   . HOH (hard of hearing)     Bilateral  hearing aids  . History of pneumonia   . GERD (gastroesophageal reflux disease)   . Headache   . Carpal tunnel syndrome of left wrist   . Arthritis   . Skin cancer, basal cell   . Presence of permanent cardiac pacemaker     Past Surgical History  Procedure Laterality Date  . Laminectomy    . Appendectomy    . Cholecystectomy    . Eye surgery    . Right rotator cuff repair    . Pacemaker placement  2008    [employer] - Dr. Doreatha Lew  . Pacemaker generator change  03/03/11    MDT Adaptal L implanted by Dr Rayann Heman  . Permanent pacemaker generator change N/A 03/03/2011    Procedure: PERMANENT PACEMAKER GENERATOR CHANGE;  Surgeon: Thompson Grayer, MD;  Location: [facility] CATH LAB;  Service: Cardiovascular;  Laterality: N/A;  . Tonsillectomy    . Colonoscopy w/ biopsies and polypectomy    . Av fistula placement Left 08/16/2014    Procedure: Left Arm creation of Brachiocephalic ARTERIOVENOUS (AV) FISTULA ;  Surgeon: Angelia Mould, MD;  Location: [place];  Service: Vascular;  Laterality: Left;  . Peripheral vascular catheterization Left 03/06/2015    Procedure: Fistulagram;  Surgeon: Serafina Mitchell, MD;  Location: [place] CV LAB;  Service: Cardiovascular;  Laterality: Left;    Social History   Social History  . Marital Status: Widowed    Spouse Name: N/A  . Number of Children: N/A  . Years of Education: N/A   Occupational History  . RETIRED    Social History Main Topics  . Smoking status: Former Smoker -- 0.80 packs/day for 12 years    Types: Cigarettes    Quit date: 01/20/1960  . Smokeless tobacco: Former Systems developer    Types: Chew    Quit date: 01/20/1980     Comment: chewed tobacco x's 10 years while playing golf only,used a pack per week  . Alcohol Use: 0.0 oz/week    0 Standard drinks or equivalent per week     Comment: Occasional glass  of wine  . Drug Use: No  . Sexual Activity: Not on file   Other Topics Concern  . Not on file   Social History Narrative    Family History  Problem Relation Age of Onset  . Coronary artery disease Father   . Heart disease Father     after age 72  . Coronary artery disease Mother   . Heart disease Mother     After age 76  . Coronary artery disease Sister   . Heart disease Sister     After age 35    Allergies as of 03/11/2015 - Review Complete 03/11/2015  Allergen Reaction Noted  . Oxycodone hcl Rash 11/29/2009    No current facility-administered medications on file prior to encounter.   Current Outpatient Prescriptions on File Prior to Encounter  Medication Sig Dispense Refill  . aspirin 81 MG  tablet Take 81 mg by mouth daily.      Marland Kitchen atenolol (TENORMIN) 25 MG tablet Take 25 mg by mouth daily. Will take one tab every other day on non dialysis days.    . calcitRIOL (ROCALTROL) 0.5 MCG capsule Take 0.5 mcg by mouth daily. Reported on 02/13/2015    . Cholecalciferol (VITAMIN D3) 1000 units CAPS Take 1,000 Units by mouth daily.    Marland Kitchen docusate sodium (COLACE) 100 MG capsule Take 100 mg by mouth as needed for constipation.    . fluticasone furoate-vilanterol (BREO ELLIPTA) 200-25 MCG/INH AEPB Inhale 1 puff into the lungs daily.    . folic acid (FOLVITE) 1 MG tablet Take 1 mg by mouth daily.      . furosemide (LASIX) 80 MG tablet Take 80 mg by mouth daily.     Marland Kitchen gabapentin (NEURONTIN) 400 MG capsule Take 400 mg by mouth daily.      Marland Kitchen HYDROcodone-acetaminophen (NORCO) 10-325 MG per tablet Take 1 tablet by mouth every 6 (six) hours as needed. (Patient taking differently: Take 1 tablet by mouth every 6 (six) hours as needed for severe pain. ) 30 tablet 0  . levothyroxine (SYNTHROID, LEVOTHROID) 112 MCG tablet Take 112 mcg by mouth daily.    . midodrine (PROAMATINE) 10 MG tablet Take 1 tablet by mouth 2 (two) times daily.    . Multiple Vitamin (MULTIVITAMIN) tablet Take 1 tablet by mouth daily.      . nitroGLYCERIN (NITROSTAT) 0.4 MG SL tablet Place 1 tablet (0.4 mg total) under the tongue every 5 (five) minutes as needed. (Patient taking differently: Place 0.4 mg under the tongue every 5 (five) minutes as needed for chest pain. ) 25 tablet 2  . omeprazole (PRILOSEC) 20 MG capsule Take 20 mg by mouth daily.      Marland Kitchen PACERONE 200 MG tablet Take 1 tablet by mouth daily.    . sevelamer carbonate (RENVELA) 800 MG tablet Take 800 mg by mouth 3 (three) times daily with meals.    . Tamsulosin HCl (FLOMAX) 0.4 MG CAPS Take 0.4 mg by mouth daily.          PHYSICAL EXAMINATION:  General: The patient is a well-nourished male, in no acute distress. Vital signs are There were no vitals taken for this  visit. Pulmonary: There is a good air exchange Musculoskeletal: There are no major deformities.  There is no significant extremity pain. Neurologic: No focal weakness or paresthesias are detected, Skin: There are no ulcer or rashes noted. Psychiatric: The patient has normal affect. There is a pulse in the proximal portion of his upper arm  AV fistula.    Impression and Plan:  Occluded left arm AV fistula. Discussed with the patient and his family present. Will take to the operating room this morning for thrombectomy and possible revision. Also explained that if there is any question regarding the use of this fistula would place a hemodialysis catheter for acute dialysis. They understand and we will proceed this morning.    Curt Jews [facility] Office: [phone]

## 2015-03-11 NOTE — Discharge Instructions (Signed)
° ° °  03/11/2015 KRISHNA SCHNETZER [id number] 1925-12-26  Surgeon(s): Rosetta Posner, MD  Procedure(s): THROMBECTOMY AND REVISION OF ARTERIOVENTOUS (AV) GORETEX  GRAFT  x May stick graft on designated area only:  Do NOT stick between incisions for 4 weeks. May stick below distal incision immediately. SEE DIAGRAM.

## 2015-03-11 NOTE — Transfer of Care (Signed)
Immediate Anesthesia Transfer of Care Note  Patient: Dale Torres  Procedure(s) Performed: Procedure(s): THROMBECTOMY AND REVISION OF ARTERIOVENTOUS (AV) GORETEX  GRAFT LEFT ARM (Left)  Patient Location: PACU  Anesthesia Type:General  Level of Consciousness: awake, alert , oriented and patient cooperative  Airway & Oxygen Therapy: Patient Spontanous Breathing and Patient connected to nasal cannula oxygen  Post-op Assessment: Report given to RN, Post -op Vital signs reviewed and stable and Patient moving all extremities X 4  Post vital signs: Reviewed and stable  Last Vitals:  Filed Vitals:   03/11/15 1023  BP: 115/56  Pulse: 82  Temp: 123XX123 C    Complications: No apparent anesthesia complications

## 2015-03-12 ENCOUNTER — Encounter (HOSPITAL_COMMUNITY): Payer: Self-pay | Admitting: Vascular Surgery

## 2015-03-12 DIAGNOSIS — D509 Iron deficiency anemia, unspecified: Secondary | ICD-10-CM | POA: Diagnosis not present

## 2015-03-12 DIAGNOSIS — Z992 Dependence on renal dialysis: Secondary | ICD-10-CM | POA: Diagnosis not present

## 2015-03-12 DIAGNOSIS — N186 End stage renal disease: Secondary | ICD-10-CM | POA: Diagnosis not present

## 2015-03-12 DIAGNOSIS — D631 Anemia in chronic kidney disease: Secondary | ICD-10-CM | POA: Diagnosis not present

## 2015-03-12 DIAGNOSIS — Z23 Encounter for immunization: Secondary | ICD-10-CM | POA: Diagnosis not present

## 2015-03-12 NOTE — Anesthesia Postprocedure Evaluation (Signed)
Anesthesia Post Note  Patient: Dale Torres  Procedure(s) Performed: Procedure(s) (LRB): THROMBECTOMY AND REVISION OF ARTERIOVENTOUS (AV) GORETEX  GRAFT LEFT ARM (Left)  Patient location during evaluation: PACU Anesthesia Type: General Level of consciousness: awake and alert Pain management: pain level controlled Vital Signs Assessment: post-procedure vital signs reviewed and stable Respiratory status: spontaneous breathing, nonlabored ventilation and respiratory function stable Cardiovascular status: blood pressure returned to baseline and stable Postop Assessment: no signs of nausea or vomiting Anesthetic complications: no    Last Vitals:  Filed Vitals:   03/11/15 1445 03/11/15 1500  BP:  96/68  Pulse: 72 68  Temp:    Resp: 17 11    Last Pain:  Filed Vitals:   03/11/15 1531  PainSc: Asleep                 Kierstynn Babich,W. EDMOND

## 2015-03-14 DIAGNOSIS — Z992 Dependence on renal dialysis: Secondary | ICD-10-CM | POA: Diagnosis not present

## 2015-03-14 DIAGNOSIS — N186 End stage renal disease: Secondary | ICD-10-CM | POA: Diagnosis not present

## 2015-03-14 DIAGNOSIS — D631 Anemia in chronic kidney disease: Secondary | ICD-10-CM | POA: Diagnosis not present

## 2015-03-14 DIAGNOSIS — D509 Iron deficiency anemia, unspecified: Secondary | ICD-10-CM | POA: Diagnosis not present

## 2015-03-14 DIAGNOSIS — Z23 Encounter for immunization: Secondary | ICD-10-CM | POA: Diagnosis not present

## 2015-03-15 ENCOUNTER — Ambulatory Visit (HOSPITAL_COMMUNITY): Admission: RE | Admit: 2015-03-15 | Payer: Medicare Other | Source: Ambulatory Visit | Admitting: Vascular Surgery

## 2015-03-15 ENCOUNTER — Encounter (HOSPITAL_COMMUNITY): Admission: RE | Payer: Self-pay | Source: Ambulatory Visit

## 2015-03-15 SURGERY — REVISON OF ARTERIOVENOUS FISTULA
Anesthesia: Choice | Site: Arm Upper

## 2015-03-16 DIAGNOSIS — N186 End stage renal disease: Secondary | ICD-10-CM | POA: Diagnosis not present

## 2015-03-16 DIAGNOSIS — D509 Iron deficiency anemia, unspecified: Secondary | ICD-10-CM | POA: Diagnosis not present

## 2015-03-16 DIAGNOSIS — Z23 Encounter for immunization: Secondary | ICD-10-CM | POA: Diagnosis not present

## 2015-03-16 DIAGNOSIS — Z992 Dependence on renal dialysis: Secondary | ICD-10-CM | POA: Diagnosis not present

## 2015-03-16 DIAGNOSIS — D631 Anemia in chronic kidney disease: Secondary | ICD-10-CM | POA: Diagnosis not present

## 2015-03-18 DIAGNOSIS — R2689 Other abnormalities of gait and mobility: Secondary | ICD-10-CM | POA: Diagnosis not present

## 2015-03-18 DIAGNOSIS — I4891 Unspecified atrial fibrillation: Secondary | ICD-10-CM | POA: Diagnosis not present

## 2015-03-18 DIAGNOSIS — Z87891 Personal history of nicotine dependence: Secondary | ICD-10-CM | POA: Diagnosis not present

## 2015-03-18 DIAGNOSIS — N186 End stage renal disease: Secondary | ICD-10-CM | POA: Diagnosis not present

## 2015-03-18 DIAGNOSIS — M6281 Muscle weakness (generalized): Secondary | ICD-10-CM | POA: Diagnosis not present

## 2015-03-18 DIAGNOSIS — Z992 Dependence on renal dialysis: Secondary | ICD-10-CM | POA: Diagnosis not present

## 2015-03-18 DIAGNOSIS — N400 Enlarged prostate without lower urinary tract symptoms: Secondary | ICD-10-CM | POA: Diagnosis not present

## 2015-03-18 DIAGNOSIS — I120 Hypertensive chronic kidney disease with stage 5 chronic kidney disease or end stage renal disease: Secondary | ICD-10-CM | POA: Diagnosis not present

## 2015-03-18 DIAGNOSIS — M170 Bilateral primary osteoarthritis of knee: Secondary | ICD-10-CM | POA: Diagnosis not present

## 2015-03-19 DIAGNOSIS — N186 End stage renal disease: Secondary | ICD-10-CM | POA: Diagnosis not present

## 2015-03-19 DIAGNOSIS — Z992 Dependence on renal dialysis: Secondary | ICD-10-CM | POA: Diagnosis not present

## 2015-03-19 DIAGNOSIS — Z23 Encounter for immunization: Secondary | ICD-10-CM | POA: Diagnosis not present

## 2015-03-19 DIAGNOSIS — D631 Anemia in chronic kidney disease: Secondary | ICD-10-CM | POA: Diagnosis not present

## 2015-03-19 DIAGNOSIS — D509 Iron deficiency anemia, unspecified: Secondary | ICD-10-CM | POA: Diagnosis not present

## 2015-03-21 DIAGNOSIS — Z992 Dependence on renal dialysis: Secondary | ICD-10-CM | POA: Diagnosis not present

## 2015-03-21 DIAGNOSIS — D631 Anemia in chronic kidney disease: Secondary | ICD-10-CM | POA: Diagnosis not present

## 2015-03-21 DIAGNOSIS — N2581 Secondary hyperparathyroidism of renal origin: Secondary | ICD-10-CM | POA: Diagnosis not present

## 2015-03-21 DIAGNOSIS — D509 Iron deficiency anemia, unspecified: Secondary | ICD-10-CM | POA: Diagnosis not present

## 2015-03-21 DIAGNOSIS — N186 End stage renal disease: Secondary | ICD-10-CM | POA: Diagnosis not present

## 2015-03-23 DIAGNOSIS — D631 Anemia in chronic kidney disease: Secondary | ICD-10-CM | POA: Diagnosis not present

## 2015-03-23 DIAGNOSIS — D509 Iron deficiency anemia, unspecified: Secondary | ICD-10-CM | POA: Diagnosis not present

## 2015-03-23 DIAGNOSIS — Z992 Dependence on renal dialysis: Secondary | ICD-10-CM | POA: Diagnosis not present

## 2015-03-23 DIAGNOSIS — N186 End stage renal disease: Secondary | ICD-10-CM | POA: Diagnosis not present

## 2015-03-23 DIAGNOSIS — N2581 Secondary hyperparathyroidism of renal origin: Secondary | ICD-10-CM | POA: Diagnosis not present

## 2015-03-25 DIAGNOSIS — M170 Bilateral primary osteoarthritis of knee: Secondary | ICD-10-CM | POA: Diagnosis not present

## 2015-03-25 DIAGNOSIS — I4891 Unspecified atrial fibrillation: Secondary | ICD-10-CM | POA: Diagnosis not present

## 2015-03-25 DIAGNOSIS — N186 End stage renal disease: Secondary | ICD-10-CM | POA: Diagnosis not present

## 2015-03-25 DIAGNOSIS — M6281 Muscle weakness (generalized): Secondary | ICD-10-CM | POA: Diagnosis not present

## 2015-03-25 DIAGNOSIS — R2689 Other abnormalities of gait and mobility: Secondary | ICD-10-CM | POA: Diagnosis not present

## 2015-03-25 DIAGNOSIS — I120 Hypertensive chronic kidney disease with stage 5 chronic kidney disease or end stage renal disease: Secondary | ICD-10-CM | POA: Diagnosis not present

## 2015-03-26 DIAGNOSIS — D631 Anemia in chronic kidney disease: Secondary | ICD-10-CM | POA: Diagnosis not present

## 2015-03-26 DIAGNOSIS — N186 End stage renal disease: Secondary | ICD-10-CM | POA: Diagnosis not present

## 2015-03-26 DIAGNOSIS — N2581 Secondary hyperparathyroidism of renal origin: Secondary | ICD-10-CM | POA: Diagnosis not present

## 2015-03-26 DIAGNOSIS — Z992 Dependence on renal dialysis: Secondary | ICD-10-CM | POA: Diagnosis not present

## 2015-03-26 DIAGNOSIS — D509 Iron deficiency anemia, unspecified: Secondary | ICD-10-CM | POA: Diagnosis not present

## 2015-03-27 DIAGNOSIS — R2689 Other abnormalities of gait and mobility: Secondary | ICD-10-CM | POA: Diagnosis not present

## 2015-03-27 DIAGNOSIS — N186 End stage renal disease: Secondary | ICD-10-CM | POA: Diagnosis not present

## 2015-03-27 DIAGNOSIS — I120 Hypertensive chronic kidney disease with stage 5 chronic kidney disease or end stage renal disease: Secondary | ICD-10-CM | POA: Diagnosis not present

## 2015-03-27 DIAGNOSIS — M6281 Muscle weakness (generalized): Secondary | ICD-10-CM | POA: Diagnosis not present

## 2015-03-27 DIAGNOSIS — I4891 Unspecified atrial fibrillation: Secondary | ICD-10-CM | POA: Diagnosis not present

## 2015-03-27 DIAGNOSIS — M170 Bilateral primary osteoarthritis of knee: Secondary | ICD-10-CM | POA: Diagnosis not present

## 2015-03-28 DIAGNOSIS — D631 Anemia in chronic kidney disease: Secondary | ICD-10-CM | POA: Diagnosis not present

## 2015-03-28 DIAGNOSIS — N2581 Secondary hyperparathyroidism of renal origin: Secondary | ICD-10-CM | POA: Diagnosis not present

## 2015-03-28 DIAGNOSIS — N186 End stage renal disease: Secondary | ICD-10-CM | POA: Diagnosis not present

## 2015-03-28 DIAGNOSIS — D509 Iron deficiency anemia, unspecified: Secondary | ICD-10-CM | POA: Diagnosis not present

## 2015-03-28 DIAGNOSIS — Z992 Dependence on renal dialysis: Secondary | ICD-10-CM | POA: Diagnosis not present

## 2015-03-30 DIAGNOSIS — N2581 Secondary hyperparathyroidism of renal origin: Secondary | ICD-10-CM | POA: Diagnosis not present

## 2015-03-30 DIAGNOSIS — Z992 Dependence on renal dialysis: Secondary | ICD-10-CM | POA: Diagnosis not present

## 2015-03-30 DIAGNOSIS — D631 Anemia in chronic kidney disease: Secondary | ICD-10-CM | POA: Diagnosis not present

## 2015-03-30 DIAGNOSIS — N186 End stage renal disease: Secondary | ICD-10-CM | POA: Diagnosis not present

## 2015-03-30 DIAGNOSIS — D509 Iron deficiency anemia, unspecified: Secondary | ICD-10-CM | POA: Diagnosis not present

## 2015-04-01 DIAGNOSIS — I4891 Unspecified atrial fibrillation: Secondary | ICD-10-CM | POA: Diagnosis not present

## 2015-04-01 DIAGNOSIS — M170 Bilateral primary osteoarthritis of knee: Secondary | ICD-10-CM | POA: Diagnosis not present

## 2015-04-01 DIAGNOSIS — R2689 Other abnormalities of gait and mobility: Secondary | ICD-10-CM | POA: Diagnosis not present

## 2015-04-01 DIAGNOSIS — I120 Hypertensive chronic kidney disease with stage 5 chronic kidney disease or end stage renal disease: Secondary | ICD-10-CM | POA: Diagnosis not present

## 2015-04-01 DIAGNOSIS — M6281 Muscle weakness (generalized): Secondary | ICD-10-CM | POA: Diagnosis not present

## 2015-04-01 DIAGNOSIS — N186 End stage renal disease: Secondary | ICD-10-CM | POA: Diagnosis not present

## 2015-04-02 DIAGNOSIS — D509 Iron deficiency anemia, unspecified: Secondary | ICD-10-CM | POA: Diagnosis not present

## 2015-04-02 DIAGNOSIS — D631 Anemia in chronic kidney disease: Secondary | ICD-10-CM | POA: Diagnosis not present

## 2015-04-02 DIAGNOSIS — Z992 Dependence on renal dialysis: Secondary | ICD-10-CM | POA: Diagnosis not present

## 2015-04-02 DIAGNOSIS — N2581 Secondary hyperparathyroidism of renal origin: Secondary | ICD-10-CM | POA: Diagnosis not present

## 2015-04-02 DIAGNOSIS — N186 End stage renal disease: Secondary | ICD-10-CM | POA: Diagnosis not present

## 2015-04-03 DIAGNOSIS — M170 Bilateral primary osteoarthritis of knee: Secondary | ICD-10-CM | POA: Diagnosis not present

## 2015-04-03 DIAGNOSIS — N186 End stage renal disease: Secondary | ICD-10-CM | POA: Diagnosis not present

## 2015-04-03 DIAGNOSIS — M6281 Muscle weakness (generalized): Secondary | ICD-10-CM | POA: Diagnosis not present

## 2015-04-03 DIAGNOSIS — I120 Hypertensive chronic kidney disease with stage 5 chronic kidney disease or end stage renal disease: Secondary | ICD-10-CM | POA: Diagnosis not present

## 2015-04-03 DIAGNOSIS — R2689 Other abnormalities of gait and mobility: Secondary | ICD-10-CM | POA: Diagnosis not present

## 2015-04-03 DIAGNOSIS — I4891 Unspecified atrial fibrillation: Secondary | ICD-10-CM | POA: Diagnosis not present

## 2015-04-04 DIAGNOSIS — N186 End stage renal disease: Secondary | ICD-10-CM | POA: Diagnosis not present

## 2015-04-04 DIAGNOSIS — D631 Anemia in chronic kidney disease: Secondary | ICD-10-CM | POA: Diagnosis not present

## 2015-04-04 DIAGNOSIS — D509 Iron deficiency anemia, unspecified: Secondary | ICD-10-CM | POA: Diagnosis not present

## 2015-04-04 DIAGNOSIS — N2581 Secondary hyperparathyroidism of renal origin: Secondary | ICD-10-CM | POA: Diagnosis not present

## 2015-04-04 DIAGNOSIS — Z992 Dependence on renal dialysis: Secondary | ICD-10-CM | POA: Diagnosis not present

## 2015-04-06 DIAGNOSIS — N2581 Secondary hyperparathyroidism of renal origin: Secondary | ICD-10-CM | POA: Diagnosis not present

## 2015-04-06 DIAGNOSIS — N186 End stage renal disease: Secondary | ICD-10-CM | POA: Diagnosis not present

## 2015-04-06 DIAGNOSIS — D631 Anemia in chronic kidney disease: Secondary | ICD-10-CM | POA: Diagnosis not present

## 2015-04-06 DIAGNOSIS — D509 Iron deficiency anemia, unspecified: Secondary | ICD-10-CM | POA: Diagnosis not present

## 2015-04-06 DIAGNOSIS — Z992 Dependence on renal dialysis: Secondary | ICD-10-CM | POA: Diagnosis not present

## 2015-04-08 DIAGNOSIS — M170 Bilateral primary osteoarthritis of knee: Secondary | ICD-10-CM | POA: Diagnosis not present

## 2015-04-08 DIAGNOSIS — M6281 Muscle weakness (generalized): Secondary | ICD-10-CM | POA: Diagnosis not present

## 2015-04-08 DIAGNOSIS — N186 End stage renal disease: Secondary | ICD-10-CM | POA: Diagnosis not present

## 2015-04-08 DIAGNOSIS — R2689 Other abnormalities of gait and mobility: Secondary | ICD-10-CM | POA: Diagnosis not present

## 2015-04-08 DIAGNOSIS — I4891 Unspecified atrial fibrillation: Secondary | ICD-10-CM | POA: Diagnosis not present

## 2015-04-08 DIAGNOSIS — I120 Hypertensive chronic kidney disease with stage 5 chronic kidney disease or end stage renal disease: Secondary | ICD-10-CM | POA: Diagnosis not present

## 2015-04-09 DIAGNOSIS — Z992 Dependence on renal dialysis: Secondary | ICD-10-CM | POA: Diagnosis not present

## 2015-04-09 DIAGNOSIS — D631 Anemia in chronic kidney disease: Secondary | ICD-10-CM | POA: Diagnosis not present

## 2015-04-09 DIAGNOSIS — N2581 Secondary hyperparathyroidism of renal origin: Secondary | ICD-10-CM | POA: Diagnosis not present

## 2015-04-09 DIAGNOSIS — N186 End stage renal disease: Secondary | ICD-10-CM | POA: Diagnosis not present

## 2015-04-09 DIAGNOSIS — D509 Iron deficiency anemia, unspecified: Secondary | ICD-10-CM | POA: Diagnosis not present

## 2015-04-10 DIAGNOSIS — N186 End stage renal disease: Secondary | ICD-10-CM | POA: Diagnosis not present

## 2015-04-10 DIAGNOSIS — I4891 Unspecified atrial fibrillation: Secondary | ICD-10-CM | POA: Diagnosis not present

## 2015-04-10 DIAGNOSIS — R2689 Other abnormalities of gait and mobility: Secondary | ICD-10-CM | POA: Diagnosis not present

## 2015-04-10 DIAGNOSIS — M170 Bilateral primary osteoarthritis of knee: Secondary | ICD-10-CM | POA: Diagnosis not present

## 2015-04-10 DIAGNOSIS — M6281 Muscle weakness (generalized): Secondary | ICD-10-CM | POA: Diagnosis not present

## 2015-04-10 DIAGNOSIS — I120 Hypertensive chronic kidney disease with stage 5 chronic kidney disease or end stage renal disease: Secondary | ICD-10-CM | POA: Diagnosis not present

## 2015-04-11 DIAGNOSIS — D509 Iron deficiency anemia, unspecified: Secondary | ICD-10-CM | POA: Diagnosis not present

## 2015-04-11 DIAGNOSIS — D631 Anemia in chronic kidney disease: Secondary | ICD-10-CM | POA: Diagnosis not present

## 2015-04-11 DIAGNOSIS — Z992 Dependence on renal dialysis: Secondary | ICD-10-CM | POA: Diagnosis not present

## 2015-04-11 DIAGNOSIS — N186 End stage renal disease: Secondary | ICD-10-CM | POA: Diagnosis not present

## 2015-04-11 DIAGNOSIS — N2581 Secondary hyperparathyroidism of renal origin: Secondary | ICD-10-CM | POA: Diagnosis not present

## 2015-04-12 DIAGNOSIS — I10 Essential (primary) hypertension: Secondary | ICD-10-CM | POA: Diagnosis not present

## 2015-04-12 DIAGNOSIS — K5900 Constipation, unspecified: Secondary | ICD-10-CM | POA: Diagnosis not present

## 2015-04-12 DIAGNOSIS — I4891 Unspecified atrial fibrillation: Secondary | ICD-10-CM | POA: Diagnosis not present

## 2015-04-12 DIAGNOSIS — M549 Dorsalgia, unspecified: Secondary | ICD-10-CM | POA: Diagnosis not present

## 2015-04-13 DIAGNOSIS — N2581 Secondary hyperparathyroidism of renal origin: Secondary | ICD-10-CM | POA: Diagnosis not present

## 2015-04-13 DIAGNOSIS — N186 End stage renal disease: Secondary | ICD-10-CM | POA: Diagnosis not present

## 2015-04-13 DIAGNOSIS — Z992 Dependence on renal dialysis: Secondary | ICD-10-CM | POA: Diagnosis not present

## 2015-04-13 DIAGNOSIS — D631 Anemia in chronic kidney disease: Secondary | ICD-10-CM | POA: Diagnosis not present

## 2015-04-13 DIAGNOSIS — D509 Iron deficiency anemia, unspecified: Secondary | ICD-10-CM | POA: Diagnosis not present

## 2015-04-15 DIAGNOSIS — R2689 Other abnormalities of gait and mobility: Secondary | ICD-10-CM | POA: Diagnosis not present

## 2015-04-15 DIAGNOSIS — N186 End stage renal disease: Secondary | ICD-10-CM | POA: Diagnosis not present

## 2015-04-15 DIAGNOSIS — I120 Hypertensive chronic kidney disease with stage 5 chronic kidney disease or end stage renal disease: Secondary | ICD-10-CM | POA: Diagnosis not present

## 2015-04-15 DIAGNOSIS — M6281 Muscle weakness (generalized): Secondary | ICD-10-CM | POA: Diagnosis not present

## 2015-04-15 DIAGNOSIS — M170 Bilateral primary osteoarthritis of knee: Secondary | ICD-10-CM | POA: Diagnosis not present

## 2015-04-15 DIAGNOSIS — I4891 Unspecified atrial fibrillation: Secondary | ICD-10-CM | POA: Diagnosis not present

## 2015-04-16 DIAGNOSIS — N186 End stage renal disease: Secondary | ICD-10-CM | POA: Diagnosis not present

## 2015-04-16 DIAGNOSIS — D509 Iron deficiency anemia, unspecified: Secondary | ICD-10-CM | POA: Diagnosis not present

## 2015-04-16 DIAGNOSIS — D631 Anemia in chronic kidney disease: Secondary | ICD-10-CM | POA: Diagnosis not present

## 2015-04-16 DIAGNOSIS — Z992 Dependence on renal dialysis: Secondary | ICD-10-CM | POA: Diagnosis not present

## 2015-04-16 DIAGNOSIS — N2581 Secondary hyperparathyroidism of renal origin: Secondary | ICD-10-CM | POA: Diagnosis not present

## 2015-04-17 DIAGNOSIS — M6281 Muscle weakness (generalized): Secondary | ICD-10-CM | POA: Diagnosis not present

## 2015-04-17 DIAGNOSIS — I120 Hypertensive chronic kidney disease with stage 5 chronic kidney disease or end stage renal disease: Secondary | ICD-10-CM | POA: Diagnosis not present

## 2015-04-17 DIAGNOSIS — I4891 Unspecified atrial fibrillation: Secondary | ICD-10-CM | POA: Diagnosis not present

## 2015-04-17 DIAGNOSIS — N186 End stage renal disease: Secondary | ICD-10-CM | POA: Diagnosis not present

## 2015-04-17 DIAGNOSIS — R2689 Other abnormalities of gait and mobility: Secondary | ICD-10-CM | POA: Diagnosis not present

## 2015-04-17 DIAGNOSIS — M170 Bilateral primary osteoarthritis of knee: Secondary | ICD-10-CM | POA: Diagnosis not present

## 2015-04-18 DIAGNOSIS — Z992 Dependence on renal dialysis: Secondary | ICD-10-CM | POA: Diagnosis not present

## 2015-04-18 DIAGNOSIS — N186 End stage renal disease: Secondary | ICD-10-CM | POA: Diagnosis not present

## 2015-04-18 DIAGNOSIS — D509 Iron deficiency anemia, unspecified: Secondary | ICD-10-CM | POA: Diagnosis not present

## 2015-04-18 DIAGNOSIS — N2581 Secondary hyperparathyroidism of renal origin: Secondary | ICD-10-CM | POA: Diagnosis not present

## 2015-04-18 DIAGNOSIS — D631 Anemia in chronic kidney disease: Secondary | ICD-10-CM | POA: Diagnosis not present

## 2015-04-19 DIAGNOSIS — Z992 Dependence on renal dialysis: Secondary | ICD-10-CM | POA: Diagnosis not present

## 2015-04-19 DIAGNOSIS — N186 End stage renal disease: Secondary | ICD-10-CM | POA: Diagnosis not present

## 2015-04-20 DIAGNOSIS — N2581 Secondary hyperparathyroidism of renal origin: Secondary | ICD-10-CM | POA: Diagnosis not present

## 2015-04-20 DIAGNOSIS — Z992 Dependence on renal dialysis: Secondary | ICD-10-CM | POA: Diagnosis not present

## 2015-04-20 DIAGNOSIS — D631 Anemia in chronic kidney disease: Secondary | ICD-10-CM | POA: Diagnosis not present

## 2015-04-20 DIAGNOSIS — D509 Iron deficiency anemia, unspecified: Secondary | ICD-10-CM | POA: Diagnosis not present

## 2015-04-20 DIAGNOSIS — N186 End stage renal disease: Secondary | ICD-10-CM | POA: Diagnosis not present

## 2015-04-22 DIAGNOSIS — M170 Bilateral primary osteoarthritis of knee: Secondary | ICD-10-CM | POA: Diagnosis not present

## 2015-04-22 DIAGNOSIS — M6281 Muscle weakness (generalized): Secondary | ICD-10-CM | POA: Diagnosis not present

## 2015-04-22 DIAGNOSIS — N186 End stage renal disease: Secondary | ICD-10-CM | POA: Diagnosis not present

## 2015-04-22 DIAGNOSIS — I120 Hypertensive chronic kidney disease with stage 5 chronic kidney disease or end stage renal disease: Secondary | ICD-10-CM | POA: Diagnosis not present

## 2015-04-22 DIAGNOSIS — I4891 Unspecified atrial fibrillation: Secondary | ICD-10-CM | POA: Diagnosis not present

## 2015-04-22 DIAGNOSIS — R2689 Other abnormalities of gait and mobility: Secondary | ICD-10-CM | POA: Diagnosis not present

## 2015-04-23 DIAGNOSIS — N186 End stage renal disease: Secondary | ICD-10-CM | POA: Diagnosis not present

## 2015-04-23 DIAGNOSIS — D509 Iron deficiency anemia, unspecified: Secondary | ICD-10-CM | POA: Diagnosis not present

## 2015-04-23 DIAGNOSIS — N2581 Secondary hyperparathyroidism of renal origin: Secondary | ICD-10-CM | POA: Diagnosis not present

## 2015-04-23 DIAGNOSIS — Z992 Dependence on renal dialysis: Secondary | ICD-10-CM | POA: Diagnosis not present

## 2015-04-23 DIAGNOSIS — D631 Anemia in chronic kidney disease: Secondary | ICD-10-CM | POA: Diagnosis not present

## 2015-04-24 DIAGNOSIS — M170 Bilateral primary osteoarthritis of knee: Secondary | ICD-10-CM | POA: Diagnosis not present

## 2015-04-24 DIAGNOSIS — M6281 Muscle weakness (generalized): Secondary | ICD-10-CM | POA: Diagnosis not present

## 2015-04-24 DIAGNOSIS — N186 End stage renal disease: Secondary | ICD-10-CM | POA: Diagnosis not present

## 2015-04-24 DIAGNOSIS — I120 Hypertensive chronic kidney disease with stage 5 chronic kidney disease or end stage renal disease: Secondary | ICD-10-CM | POA: Diagnosis not present

## 2015-04-24 DIAGNOSIS — I4891 Unspecified atrial fibrillation: Secondary | ICD-10-CM | POA: Diagnosis not present

## 2015-04-24 DIAGNOSIS — R2689 Other abnormalities of gait and mobility: Secondary | ICD-10-CM | POA: Diagnosis not present

## 2015-04-25 DIAGNOSIS — N2581 Secondary hyperparathyroidism of renal origin: Secondary | ICD-10-CM | POA: Diagnosis not present

## 2015-04-25 DIAGNOSIS — D631 Anemia in chronic kidney disease: Secondary | ICD-10-CM | POA: Diagnosis not present

## 2015-04-25 DIAGNOSIS — N186 End stage renal disease: Secondary | ICD-10-CM | POA: Diagnosis not present

## 2015-04-25 DIAGNOSIS — Z992 Dependence on renal dialysis: Secondary | ICD-10-CM | POA: Diagnosis not present

## 2015-04-25 DIAGNOSIS — D509 Iron deficiency anemia, unspecified: Secondary | ICD-10-CM | POA: Diagnosis not present

## 2015-04-26 ENCOUNTER — Encounter: Payer: Self-pay | Admitting: Internal Medicine

## 2015-04-26 ENCOUNTER — Ambulatory Visit (INDEPENDENT_AMBULATORY_CARE_PROVIDER_SITE_OTHER): Payer: Medicare Other | Admitting: Internal Medicine

## 2015-04-26 VITALS — BP 95/54 | HR 90 | Ht 70.0 in | Wt 196.0 lb

## 2015-04-26 DIAGNOSIS — I495 Sick sinus syndrome: Secondary | ICD-10-CM | POA: Diagnosis not present

## 2015-04-26 DIAGNOSIS — I481 Persistent atrial fibrillation: Secondary | ICD-10-CM | POA: Diagnosis not present

## 2015-04-26 DIAGNOSIS — I2510 Atherosclerotic heart disease of native coronary artery without angina pectoris: Secondary | ICD-10-CM | POA: Diagnosis not present

## 2015-04-26 DIAGNOSIS — I10 Essential (primary) hypertension: Secondary | ICD-10-CM

## 2015-04-26 DIAGNOSIS — I4819 Other persistent atrial fibrillation: Secondary | ICD-10-CM

## 2015-04-26 LAB — CUP PACEART INCLINIC DEVICE CHECK
Battery Remaining Longevity: 123 mo
Battery Voltage: 2.79 V
Date Time Interrogation Session: 20170407141408
Implantable Lead Implant Date: 20080604
Implantable Lead Location: 753859
Implantable Lead Model: 4470
Implantable Lead Serial Number: 498205
Lead Channel Impedance Value: 437 Ohm
Lead Channel Impedance Value: 454 Ohm
Lead Channel Pacing Threshold Amplitude: 0.5 V
Lead Channel Pacing Threshold Pulse Width: 0.4 ms
Lead Channel Setting Pacing Amplitude: 2 V
Lead Channel Setting Pacing Amplitude: 2.5 V
Lead Channel Setting Pacing Pulse Width: 0.4 ms
Lead Channel Setting Sensing Sensitivity: 2.8 mV
MDC IDC LEAD IMPLANT DT: 20080604
MDC IDC LEAD LOCATION: 753860
MDC IDC LEAD SERIAL: 601713
MDC IDC MSMT BATTERY IMPEDANCE: 186 Ohm
MDC IDC MSMT LEADCHNL RA SENSING INTR AMPL: 0.35 mV
MDC IDC MSMT LEADCHNL RV PACING THRESHOLD AMPLITUDE: 0.5 V
MDC IDC MSMT LEADCHNL RV PACING THRESHOLD PULSEWIDTH: 0.4 ms
MDC IDC MSMT LEADCHNL RV SENSING INTR AMPL: 11.2 mV
MDC IDC STAT BRADY AP VP PERCENT: 3 %
MDC IDC STAT BRADY AP VS PERCENT: 75 %
MDC IDC STAT BRADY AS VP PERCENT: 0 %
MDC IDC STAT BRADY AS VS PERCENT: 21 %

## 2015-04-26 NOTE — Progress Notes (Signed)
Electrophysiology Office Note   Date:  04/26/2015   ID:  Dale Torres, DOB 01-01-1926, MRN [id number]  PCP:  Monico Blitz, MD  Cardiologist:  Dr Domenic Polite Primary Electrophysiologist: Thompson Grayer, MD    Chief Complaint  Patient presents with  . Shortness of Breath     History of Present Illness: Dale Torres is a 82 y.o. male who presents today for electrophysiology evaluation.   On dialysis and having difficulty with hypotension.  Has been in AF since November.  Not felt to be a candidate for anticoagulation by patient and daughter.  Has SOB with activity.  Edema is chronically an issue.   Today, he denies symptoms of palpitations, chest pain,  orthopnea, PND,  claudication, dizziness, presyncope, syncope, bleeding, or neurologic sequela. The patient is tolerating medications without difficulties and is otherwise without complaint today.    Past Medical History  Diagnosis Date  . Sick sinus syndrome [facility])     Status post pacemaker placement 2008  . Paroxysmal atrial fibrillation (HCC)     Declines coumadin  . Essential hypertension, benign   . Benign prostatic hypertrophy   . ESRD on hemodialysis ([place])   . Coronary atherosclerosis of native coronary artery     Nonobstructive 2009  . Anemia     Dr. Sonny Dandy  . Hypothyroidism   . HOH (hard of hearing)     Bilateral  hearing aids  . History of pneumonia   . GERD (gastroesophageal reflux disease)   . Headache   . Carpal tunnel syndrome of left wrist   . Arthritis   . Skin cancer, basal cell   . Presence of permanent cardiac pacemaker    Past Surgical History  Procedure Laterality Date  . Laminectomy    . Appendectomy    . Cholecystectomy    . Eye surgery    . Right rotator cuff repair    . Pacemaker placement  2008    [employer] - Dr. Doreatha Lew  . Pacemaker generator change  03/03/11    MDT Adaptal L implanted by Dr Rayann Heman  . Permanent pacemaker generator change N/A 03/03/2011    Procedure: PERMANENT PACEMAKER  GENERATOR CHANGE;  Surgeon: Thompson Grayer, MD;  Location: [facility] CATH LAB;  Service: Cardiovascular;  Laterality: N/A;  . Tonsillectomy    . Colonoscopy w/ biopsies and polypectomy    . Av fistula placement Left 08/16/2014    Procedure: Left Arm creation of Brachiocephalic ARTERIOVENOUS (AV) FISTULA ;  Surgeon: Angelia Mould, MD;  Location: [place];  Service: Vascular;  Laterality: Left;  . Peripheral vascular catheterization Left 03/06/2015    Procedure: Fistulagram;  Surgeon: Serafina Mitchell, MD;  Location: [place] CV LAB;  Service: Cardiovascular;  Laterality: Left;  . Thrombectomy and revision of arterioventous (av) goretex  graft Left 03/11/2015    Procedure: THROMBECTOMY AND REVISION OF ARTERIOVENTOUS (AV) GORETEX  GRAFT LEFT ARM;  Surgeon: Rosetta Posner, MD;  Location[facility] OR;  Service: Vascular;  Laterality: Left;     Current Outpatient Prescriptions  Medication Sig Dispense Refill  . aspirin 81 MG tablet Take 81 mg by mouth daily.      Marland Kitchen atenolol (TENORMIN) 25 MG tablet Take 25 mg by mouth daily. Will take one tab every other day on non dialysis days.    . calcitRIOL (ROCALTROL) 0.5 MCG capsule Take 0.5 mcg by mouth daily. Reported on 02/13/2015    . Cholecalciferol (VITAMIN D3) 1000 units CAPS Take 1,000 Units by mouth daily.    Marland Kitchen  docusate sodium (COLACE) 100 MG capsule Take 100 mg by mouth as needed for constipation.    . fluticasone furoate-vilanterol (BREO ELLIPTA) 200-25 MCG/INH AEPB Inhale 1 puff into the lungs daily.    . folic acid (FOLVITE) 1 MG tablet Take 1 mg by mouth daily.      . furosemide (LASIX) 80 MG tablet Take 80 mg by mouth daily.     Marland Kitchen gabapentin (NEURONTIN) 400 MG capsule Take 400 mg by mouth daily.      Marland Kitchen HYDROcodone-acetaminophen (NORCO) 10-325 MG tablet Take 1 tablet by mouth every 6 (six) hours as needed for severe pain. 30 tablet 0  . levothyroxine (SYNTHROID, LEVOTHROID) 112 MCG tablet Take 112 mcg by mouth daily.    . midodrine (PROAMATINE) 10 MG tablet  Take 1 tablet by mouth 2 (two) times daily.    . Multiple Vitamin (MULTIVITAMIN) tablet Take 1 tablet by mouth daily.      . nitroGLYCERIN (NITROSTAT) 0.4 MG SL tablet Place 1 tablet (0.4 mg total) under the tongue every 5 (five) minutes as needed. (Patient taking differently: Place 0.4 mg under the tongue every 5 (five) minutes as needed for chest pain. ) 25 tablet 2  . omeprazole (PRILOSEC) 20 MG capsule Take 20 mg by mouth daily.      . sevelamer carbonate (RENVELA) 800 MG tablet Take 800 mg by mouth 3 (three) times daily with meals.    . Tamsulosin HCl (FLOMAX) 0.4 MG CAPS Take 0.4 mg by mouth daily.       No current facility-administered medications for this visit.    Allergies:   Oxycodone hcl   Social History:  The patient  reports that he quit smoking about 55 years ago. His smoking use included Cigarettes. He started smoking about 67 years ago. He has a 9.6 pack-year smoking history. He quit smokeless tobacco use about 35 years ago. His smokeless tobacco use included Chew. He reports that he drinks alcohol. He reports that he does not use illicit drugs.   Family History:  The patient's family history includes Coronary artery disease in his father, mother, and sister; Heart disease in his father, mother, and sister.    ROS:  Please see the history of present illness.   All other systems are reviewed and negative.    PHYSICAL EXAM: VS:  BP 95/54 mmHg  Pulse 90  Ht 5\' 10"  (1.778 m)  Wt 196 lb (88.905 kg)  BMI 28.12 kg/m2  SpO2 95% , BMI Body mass index is 28.12 kg/(m^2). GEN: Walks slowly with a cane in no acute distress HEENT: normal Neck: no JVD, carotid bruits, or masses Cardiac: iRRR;   +2 edema  Respiratory:  clear to auscultation bilaterally, normal work of breathing GI: soft, nontender, nondistended, + BS MS: diffuse muscle atrophy Skin: warm and dry, device pocket is well healed Neuro:  Strength and sensation are intact Psych: euthymic mood, full affect  Device  interrogation is reviewed today in detail.  See PaceArt for details.  Wt Readings from Last 3 Encounters:  04/26/15 196 lb (88.905 kg)  03/06/15 198 lb (89.812 kg)  02/25/15 198 lb 3.2 oz (89.903 kg)     ASSESSMENT AND PLAN:  1. Sick sinus syndrome Normal pacemaker function See Claudia Desanctis Art report No changes today Will likely reprogram VVIR upon return in a year  2. afib Persistently in afib since November despite amiodarone.  V rates appear mostly controlled. He has declined anticoagulation I agree with Dr Domenic Polite that we should  stop amiodarone as it is now ineffective Not a watchman candidate given advanced age  31. HTN/ hypotension Now hypotensive with HD On midodrine Hopefully BP may improve further off of amiodarone  carelink Return in 1 year  Current medicines are reviewed at length with the patient today.   The patient does not have concerns regarding his medicines.  The following changes were made today:  none  Labs/ tests ordered today include:  No orders of the defined types were placed in this encounter.   Army Fossa, MD  04/26/2015 10:40 AM     [facility] [address] [place] [address] [phone] (office) [phone] (fax)

## 2015-04-26 NOTE — Patient Instructions (Addendum)
Your physician has recommended you make the following change in your medication:  Stop amiodarone/pacerone. Continue all other medications the same.  Device check on 07/29/15. Your physician recommends that you schedule a follow-up appointment in: 1 year with Dr. Rayann Heman.

## 2015-04-27 DIAGNOSIS — D509 Iron deficiency anemia, unspecified: Secondary | ICD-10-CM | POA: Diagnosis not present

## 2015-04-27 DIAGNOSIS — Z992 Dependence on renal dialysis: Secondary | ICD-10-CM | POA: Diagnosis not present

## 2015-04-27 DIAGNOSIS — D631 Anemia in chronic kidney disease: Secondary | ICD-10-CM | POA: Diagnosis not present

## 2015-04-27 DIAGNOSIS — N186 End stage renal disease: Secondary | ICD-10-CM | POA: Diagnosis not present

## 2015-04-27 DIAGNOSIS — N2581 Secondary hyperparathyroidism of renal origin: Secondary | ICD-10-CM | POA: Diagnosis not present

## 2015-04-29 DIAGNOSIS — N186 End stage renal disease: Secondary | ICD-10-CM | POA: Diagnosis not present

## 2015-04-29 DIAGNOSIS — M170 Bilateral primary osteoarthritis of knee: Secondary | ICD-10-CM | POA: Diagnosis not present

## 2015-04-29 DIAGNOSIS — I120 Hypertensive chronic kidney disease with stage 5 chronic kidney disease or end stage renal disease: Secondary | ICD-10-CM | POA: Diagnosis not present

## 2015-04-29 DIAGNOSIS — M6281 Muscle weakness (generalized): Secondary | ICD-10-CM | POA: Diagnosis not present

## 2015-04-29 DIAGNOSIS — I4891 Unspecified atrial fibrillation: Secondary | ICD-10-CM | POA: Diagnosis not present

## 2015-04-29 DIAGNOSIS — R2689 Other abnormalities of gait and mobility: Secondary | ICD-10-CM | POA: Diagnosis not present

## 2015-04-30 DIAGNOSIS — Z992 Dependence on renal dialysis: Secondary | ICD-10-CM | POA: Diagnosis not present

## 2015-04-30 DIAGNOSIS — N186 End stage renal disease: Secondary | ICD-10-CM | POA: Diagnosis not present

## 2015-04-30 DIAGNOSIS — D631 Anemia in chronic kidney disease: Secondary | ICD-10-CM | POA: Diagnosis not present

## 2015-04-30 DIAGNOSIS — D509 Iron deficiency anemia, unspecified: Secondary | ICD-10-CM | POA: Diagnosis not present

## 2015-04-30 DIAGNOSIS — N2581 Secondary hyperparathyroidism of renal origin: Secondary | ICD-10-CM | POA: Diagnosis not present

## 2015-05-01 DIAGNOSIS — M170 Bilateral primary osteoarthritis of knee: Secondary | ICD-10-CM | POA: Diagnosis not present

## 2015-05-01 DIAGNOSIS — R2689 Other abnormalities of gait and mobility: Secondary | ICD-10-CM | POA: Diagnosis not present

## 2015-05-01 DIAGNOSIS — I4891 Unspecified atrial fibrillation: Secondary | ICD-10-CM | POA: Diagnosis not present

## 2015-05-01 DIAGNOSIS — I120 Hypertensive chronic kidney disease with stage 5 chronic kidney disease or end stage renal disease: Secondary | ICD-10-CM | POA: Diagnosis not present

## 2015-05-01 DIAGNOSIS — M6281 Muscle weakness (generalized): Secondary | ICD-10-CM | POA: Diagnosis not present

## 2015-05-01 DIAGNOSIS — N186 End stage renal disease: Secondary | ICD-10-CM | POA: Diagnosis not present

## 2015-05-02 DIAGNOSIS — Z992 Dependence on renal dialysis: Secondary | ICD-10-CM | POA: Diagnosis not present

## 2015-05-02 DIAGNOSIS — N2581 Secondary hyperparathyroidism of renal origin: Secondary | ICD-10-CM | POA: Diagnosis not present

## 2015-05-02 DIAGNOSIS — D631 Anemia in chronic kidney disease: Secondary | ICD-10-CM | POA: Diagnosis not present

## 2015-05-02 DIAGNOSIS — D509 Iron deficiency anemia, unspecified: Secondary | ICD-10-CM | POA: Diagnosis not present

## 2015-05-02 DIAGNOSIS — N186 End stage renal disease: Secondary | ICD-10-CM | POA: Diagnosis not present

## 2015-05-04 DIAGNOSIS — N2581 Secondary hyperparathyroidism of renal origin: Secondary | ICD-10-CM | POA: Diagnosis not present

## 2015-05-04 DIAGNOSIS — N186 End stage renal disease: Secondary | ICD-10-CM | POA: Diagnosis not present

## 2015-05-04 DIAGNOSIS — D631 Anemia in chronic kidney disease: Secondary | ICD-10-CM | POA: Diagnosis not present

## 2015-05-04 DIAGNOSIS — D509 Iron deficiency anemia, unspecified: Secondary | ICD-10-CM | POA: Diagnosis not present

## 2015-05-04 DIAGNOSIS — Z992 Dependence on renal dialysis: Secondary | ICD-10-CM | POA: Diagnosis not present

## 2015-05-06 DIAGNOSIS — M6281 Muscle weakness (generalized): Secondary | ICD-10-CM | POA: Diagnosis not present

## 2015-05-06 DIAGNOSIS — I120 Hypertensive chronic kidney disease with stage 5 chronic kidney disease or end stage renal disease: Secondary | ICD-10-CM | POA: Diagnosis not present

## 2015-05-06 DIAGNOSIS — N186 End stage renal disease: Secondary | ICD-10-CM | POA: Diagnosis not present

## 2015-05-06 DIAGNOSIS — I4891 Unspecified atrial fibrillation: Secondary | ICD-10-CM | POA: Diagnosis not present

## 2015-05-06 DIAGNOSIS — R2689 Other abnormalities of gait and mobility: Secondary | ICD-10-CM | POA: Diagnosis not present

## 2015-05-06 DIAGNOSIS — M170 Bilateral primary osteoarthritis of knee: Secondary | ICD-10-CM | POA: Diagnosis not present

## 2015-05-07 DIAGNOSIS — D509 Iron deficiency anemia, unspecified: Secondary | ICD-10-CM | POA: Diagnosis not present

## 2015-05-07 DIAGNOSIS — N186 End stage renal disease: Secondary | ICD-10-CM | POA: Diagnosis not present

## 2015-05-07 DIAGNOSIS — Z992 Dependence on renal dialysis: Secondary | ICD-10-CM | POA: Diagnosis not present

## 2015-05-07 DIAGNOSIS — N2581 Secondary hyperparathyroidism of renal origin: Secondary | ICD-10-CM | POA: Diagnosis not present

## 2015-05-07 DIAGNOSIS — D631 Anemia in chronic kidney disease: Secondary | ICD-10-CM | POA: Diagnosis not present

## 2015-05-08 DIAGNOSIS — R2689 Other abnormalities of gait and mobility: Secondary | ICD-10-CM | POA: Diagnosis not present

## 2015-05-08 DIAGNOSIS — M170 Bilateral primary osteoarthritis of knee: Secondary | ICD-10-CM | POA: Diagnosis not present

## 2015-05-08 DIAGNOSIS — N186 End stage renal disease: Secondary | ICD-10-CM | POA: Diagnosis not present

## 2015-05-08 DIAGNOSIS — I120 Hypertensive chronic kidney disease with stage 5 chronic kidney disease or end stage renal disease: Secondary | ICD-10-CM | POA: Diagnosis not present

## 2015-05-08 DIAGNOSIS — I4891 Unspecified atrial fibrillation: Secondary | ICD-10-CM | POA: Diagnosis not present

## 2015-05-08 DIAGNOSIS — M6281 Muscle weakness (generalized): Secondary | ICD-10-CM | POA: Diagnosis not present

## 2015-05-09 DIAGNOSIS — N186 End stage renal disease: Secondary | ICD-10-CM | POA: Diagnosis not present

## 2015-05-09 DIAGNOSIS — Z992 Dependence on renal dialysis: Secondary | ICD-10-CM | POA: Diagnosis not present

## 2015-05-09 DIAGNOSIS — D509 Iron deficiency anemia, unspecified: Secondary | ICD-10-CM | POA: Diagnosis not present

## 2015-05-09 DIAGNOSIS — D631 Anemia in chronic kidney disease: Secondary | ICD-10-CM | POA: Diagnosis not present

## 2015-05-09 DIAGNOSIS — N2581 Secondary hyperparathyroidism of renal origin: Secondary | ICD-10-CM | POA: Diagnosis not present

## 2015-05-11 DIAGNOSIS — D631 Anemia in chronic kidney disease: Secondary | ICD-10-CM | POA: Diagnosis not present

## 2015-05-11 DIAGNOSIS — Z992 Dependence on renal dialysis: Secondary | ICD-10-CM | POA: Diagnosis not present

## 2015-05-11 DIAGNOSIS — N2581 Secondary hyperparathyroidism of renal origin: Secondary | ICD-10-CM | POA: Diagnosis not present

## 2015-05-11 DIAGNOSIS — N186 End stage renal disease: Secondary | ICD-10-CM | POA: Diagnosis not present

## 2015-05-11 DIAGNOSIS — D509 Iron deficiency anemia, unspecified: Secondary | ICD-10-CM | POA: Diagnosis not present

## 2015-05-13 DIAGNOSIS — I4891 Unspecified atrial fibrillation: Secondary | ICD-10-CM | POA: Diagnosis not present

## 2015-05-13 DIAGNOSIS — M6281 Muscle weakness (generalized): Secondary | ICD-10-CM | POA: Diagnosis not present

## 2015-05-13 DIAGNOSIS — I120 Hypertensive chronic kidney disease with stage 5 chronic kidney disease or end stage renal disease: Secondary | ICD-10-CM | POA: Diagnosis not present

## 2015-05-13 DIAGNOSIS — N186 End stage renal disease: Secondary | ICD-10-CM | POA: Diagnosis not present

## 2015-05-13 DIAGNOSIS — R2689 Other abnormalities of gait and mobility: Secondary | ICD-10-CM | POA: Diagnosis not present

## 2015-05-13 DIAGNOSIS — M170 Bilateral primary osteoarthritis of knee: Secondary | ICD-10-CM | POA: Diagnosis not present

## 2015-05-14 DIAGNOSIS — N2581 Secondary hyperparathyroidism of renal origin: Secondary | ICD-10-CM | POA: Diagnosis not present

## 2015-05-14 DIAGNOSIS — D509 Iron deficiency anemia, unspecified: Secondary | ICD-10-CM | POA: Diagnosis not present

## 2015-05-14 DIAGNOSIS — N186 End stage renal disease: Secondary | ICD-10-CM | POA: Diagnosis not present

## 2015-05-14 DIAGNOSIS — D631 Anemia in chronic kidney disease: Secondary | ICD-10-CM | POA: Diagnosis not present

## 2015-05-14 DIAGNOSIS — Z992 Dependence on renal dialysis: Secondary | ICD-10-CM | POA: Diagnosis not present

## 2015-05-15 DIAGNOSIS — M170 Bilateral primary osteoarthritis of knee: Secondary | ICD-10-CM | POA: Diagnosis not present

## 2015-05-15 DIAGNOSIS — R2689 Other abnormalities of gait and mobility: Secondary | ICD-10-CM | POA: Diagnosis not present

## 2015-05-15 DIAGNOSIS — I120 Hypertensive chronic kidney disease with stage 5 chronic kidney disease or end stage renal disease: Secondary | ICD-10-CM | POA: Diagnosis not present

## 2015-05-15 DIAGNOSIS — N186 End stage renal disease: Secondary | ICD-10-CM | POA: Diagnosis not present

## 2015-05-15 DIAGNOSIS — M6281 Muscle weakness (generalized): Secondary | ICD-10-CM | POA: Diagnosis not present

## 2015-05-15 DIAGNOSIS — I4891 Unspecified atrial fibrillation: Secondary | ICD-10-CM | POA: Diagnosis not present

## 2015-05-16 DIAGNOSIS — N2581 Secondary hyperparathyroidism of renal origin: Secondary | ICD-10-CM | POA: Diagnosis not present

## 2015-05-16 DIAGNOSIS — D631 Anemia in chronic kidney disease: Secondary | ICD-10-CM | POA: Diagnosis not present

## 2015-05-16 DIAGNOSIS — Z992 Dependence on renal dialysis: Secondary | ICD-10-CM | POA: Diagnosis not present

## 2015-05-16 DIAGNOSIS — N186 End stage renal disease: Secondary | ICD-10-CM | POA: Diagnosis not present

## 2015-05-16 DIAGNOSIS — D509 Iron deficiency anemia, unspecified: Secondary | ICD-10-CM | POA: Diagnosis not present

## 2015-05-17 DIAGNOSIS — M6281 Muscle weakness (generalized): Secondary | ICD-10-CM | POA: Diagnosis not present

## 2015-05-17 DIAGNOSIS — Z992 Dependence on renal dialysis: Secondary | ICD-10-CM | POA: Diagnosis not present

## 2015-05-17 DIAGNOSIS — R2689 Other abnormalities of gait and mobility: Secondary | ICD-10-CM | POA: Diagnosis not present

## 2015-05-17 DIAGNOSIS — I120 Hypertensive chronic kidney disease with stage 5 chronic kidney disease or end stage renal disease: Secondary | ICD-10-CM | POA: Diagnosis not present

## 2015-05-17 DIAGNOSIS — Z87891 Personal history of nicotine dependence: Secondary | ICD-10-CM | POA: Diagnosis not present

## 2015-05-17 DIAGNOSIS — M170 Bilateral primary osteoarthritis of knee: Secondary | ICD-10-CM | POA: Diagnosis not present

## 2015-05-17 DIAGNOSIS — N186 End stage renal disease: Secondary | ICD-10-CM | POA: Diagnosis not present

## 2015-05-17 DIAGNOSIS — I4891 Unspecified atrial fibrillation: Secondary | ICD-10-CM | POA: Diagnosis not present

## 2015-05-17 DIAGNOSIS — N400 Enlarged prostate without lower urinary tract symptoms: Secondary | ICD-10-CM | POA: Diagnosis not present

## 2015-05-18 DIAGNOSIS — N186 End stage renal disease: Secondary | ICD-10-CM | POA: Diagnosis not present

## 2015-05-18 DIAGNOSIS — D509 Iron deficiency anemia, unspecified: Secondary | ICD-10-CM | POA: Diagnosis not present

## 2015-05-18 DIAGNOSIS — Z992 Dependence on renal dialysis: Secondary | ICD-10-CM | POA: Diagnosis not present

## 2015-05-18 DIAGNOSIS — N2581 Secondary hyperparathyroidism of renal origin: Secondary | ICD-10-CM | POA: Diagnosis not present

## 2015-05-18 DIAGNOSIS — D631 Anemia in chronic kidney disease: Secondary | ICD-10-CM | POA: Diagnosis not present

## 2015-05-19 DIAGNOSIS — Z992 Dependence on renal dialysis: Secondary | ICD-10-CM | POA: Diagnosis not present

## 2015-05-19 DIAGNOSIS — N186 End stage renal disease: Secondary | ICD-10-CM | POA: Diagnosis not present

## 2015-05-20 DIAGNOSIS — N186 End stage renal disease: Secondary | ICD-10-CM | POA: Diagnosis not present

## 2015-05-20 DIAGNOSIS — I120 Hypertensive chronic kidney disease with stage 5 chronic kidney disease or end stage renal disease: Secondary | ICD-10-CM | POA: Diagnosis not present

## 2015-05-20 DIAGNOSIS — M170 Bilateral primary osteoarthritis of knee: Secondary | ICD-10-CM | POA: Diagnosis not present

## 2015-05-20 DIAGNOSIS — R2689 Other abnormalities of gait and mobility: Secondary | ICD-10-CM | POA: Diagnosis not present

## 2015-05-20 DIAGNOSIS — I4891 Unspecified atrial fibrillation: Secondary | ICD-10-CM | POA: Diagnosis not present

## 2015-05-20 DIAGNOSIS — M6281 Muscle weakness (generalized): Secondary | ICD-10-CM | POA: Diagnosis not present

## 2015-05-21 DIAGNOSIS — Z992 Dependence on renal dialysis: Secondary | ICD-10-CM | POA: Diagnosis not present

## 2015-05-21 DIAGNOSIS — N2581 Secondary hyperparathyroidism of renal origin: Secondary | ICD-10-CM | POA: Diagnosis not present

## 2015-05-21 DIAGNOSIS — N186 End stage renal disease: Secondary | ICD-10-CM | POA: Diagnosis not present

## 2015-05-22 DIAGNOSIS — I4891 Unspecified atrial fibrillation: Secondary | ICD-10-CM | POA: Diagnosis not present

## 2015-05-22 DIAGNOSIS — M6281 Muscle weakness (generalized): Secondary | ICD-10-CM | POA: Diagnosis not present

## 2015-05-22 DIAGNOSIS — N186 End stage renal disease: Secondary | ICD-10-CM | POA: Diagnosis not present

## 2015-05-22 DIAGNOSIS — R2689 Other abnormalities of gait and mobility: Secondary | ICD-10-CM | POA: Diagnosis not present

## 2015-05-22 DIAGNOSIS — M170 Bilateral primary osteoarthritis of knee: Secondary | ICD-10-CM | POA: Diagnosis not present

## 2015-05-22 DIAGNOSIS — I120 Hypertensive chronic kidney disease with stage 5 chronic kidney disease or end stage renal disease: Secondary | ICD-10-CM | POA: Diagnosis not present

## 2015-05-23 DIAGNOSIS — I4891 Unspecified atrial fibrillation: Secondary | ICD-10-CM | POA: Diagnosis not present

## 2015-05-23 DIAGNOSIS — N186 End stage renal disease: Secondary | ICD-10-CM | POA: Diagnosis not present

## 2015-05-23 DIAGNOSIS — Z992 Dependence on renal dialysis: Secondary | ICD-10-CM | POA: Diagnosis not present

## 2015-05-23 DIAGNOSIS — M159 Polyosteoarthritis, unspecified: Secondary | ICD-10-CM | POA: Diagnosis not present

## 2015-05-23 DIAGNOSIS — I10 Essential (primary) hypertension: Secondary | ICD-10-CM | POA: Diagnosis not present

## 2015-05-23 DIAGNOSIS — N2581 Secondary hyperparathyroidism of renal origin: Secondary | ICD-10-CM | POA: Diagnosis not present

## 2015-05-25 DIAGNOSIS — N2581 Secondary hyperparathyroidism of renal origin: Secondary | ICD-10-CM | POA: Diagnosis not present

## 2015-05-25 DIAGNOSIS — N186 End stage renal disease: Secondary | ICD-10-CM | POA: Diagnosis not present

## 2015-05-25 DIAGNOSIS — Z992 Dependence on renal dialysis: Secondary | ICD-10-CM | POA: Diagnosis not present

## 2015-05-27 DIAGNOSIS — M6281 Muscle weakness (generalized): Secondary | ICD-10-CM | POA: Diagnosis not present

## 2015-05-27 DIAGNOSIS — I10 Essential (primary) hypertension: Secondary | ICD-10-CM | POA: Diagnosis not present

## 2015-05-27 DIAGNOSIS — J40 Bronchitis, not specified as acute or chronic: Secondary | ICD-10-CM | POA: Diagnosis not present

## 2015-05-27 DIAGNOSIS — I120 Hypertensive chronic kidney disease with stage 5 chronic kidney disease or end stage renal disease: Secondary | ICD-10-CM | POA: Diagnosis not present

## 2015-05-27 DIAGNOSIS — M25569 Pain in unspecified knee: Secondary | ICD-10-CM | POA: Diagnosis not present

## 2015-05-27 DIAGNOSIS — I4891 Unspecified atrial fibrillation: Secondary | ICD-10-CM | POA: Diagnosis not present

## 2015-05-27 DIAGNOSIS — N186 End stage renal disease: Secondary | ICD-10-CM | POA: Diagnosis not present

## 2015-05-27 DIAGNOSIS — M170 Bilateral primary osteoarthritis of knee: Secondary | ICD-10-CM | POA: Diagnosis not present

## 2015-05-27 DIAGNOSIS — R2689 Other abnormalities of gait and mobility: Secondary | ICD-10-CM | POA: Diagnosis not present

## 2015-05-28 DIAGNOSIS — Z992 Dependence on renal dialysis: Secondary | ICD-10-CM | POA: Diagnosis not present

## 2015-05-28 DIAGNOSIS — N186 End stage renal disease: Secondary | ICD-10-CM | POA: Diagnosis not present

## 2015-05-28 DIAGNOSIS — N2581 Secondary hyperparathyroidism of renal origin: Secondary | ICD-10-CM | POA: Diagnosis not present

## 2015-05-30 DIAGNOSIS — N186 End stage renal disease: Secondary | ICD-10-CM | POA: Diagnosis not present

## 2015-05-30 DIAGNOSIS — N2581 Secondary hyperparathyroidism of renal origin: Secondary | ICD-10-CM | POA: Diagnosis not present

## 2015-05-30 DIAGNOSIS — Z992 Dependence on renal dialysis: Secondary | ICD-10-CM | POA: Diagnosis not present

## 2015-06-01 DIAGNOSIS — N2581 Secondary hyperparathyroidism of renal origin: Secondary | ICD-10-CM | POA: Diagnosis not present

## 2015-06-01 DIAGNOSIS — N186 End stage renal disease: Secondary | ICD-10-CM | POA: Diagnosis not present

## 2015-06-01 DIAGNOSIS — Z992 Dependence on renal dialysis: Secondary | ICD-10-CM | POA: Diagnosis not present

## 2015-06-03 DIAGNOSIS — M170 Bilateral primary osteoarthritis of knee: Secondary | ICD-10-CM | POA: Diagnosis not present

## 2015-06-03 DIAGNOSIS — I4891 Unspecified atrial fibrillation: Secondary | ICD-10-CM | POA: Diagnosis not present

## 2015-06-03 DIAGNOSIS — N186 End stage renal disease: Secondary | ICD-10-CM | POA: Diagnosis not present

## 2015-06-03 DIAGNOSIS — I120 Hypertensive chronic kidney disease with stage 5 chronic kidney disease or end stage renal disease: Secondary | ICD-10-CM | POA: Diagnosis not present

## 2015-06-03 DIAGNOSIS — M6281 Muscle weakness (generalized): Secondary | ICD-10-CM | POA: Diagnosis not present

## 2015-06-03 DIAGNOSIS — R2689 Other abnormalities of gait and mobility: Secondary | ICD-10-CM | POA: Diagnosis not present

## 2015-06-04 DIAGNOSIS — Z992 Dependence on renal dialysis: Secondary | ICD-10-CM | POA: Diagnosis not present

## 2015-06-04 DIAGNOSIS — N2581 Secondary hyperparathyroidism of renal origin: Secondary | ICD-10-CM | POA: Diagnosis not present

## 2015-06-04 DIAGNOSIS — N186 End stage renal disease: Secondary | ICD-10-CM | POA: Diagnosis not present

## 2015-06-06 DIAGNOSIS — Z992 Dependence on renal dialysis: Secondary | ICD-10-CM | POA: Diagnosis not present

## 2015-06-06 DIAGNOSIS — N2581 Secondary hyperparathyroidism of renal origin: Secondary | ICD-10-CM | POA: Diagnosis not present

## 2015-06-06 DIAGNOSIS — N186 End stage renal disease: Secondary | ICD-10-CM | POA: Diagnosis not present

## 2015-06-08 DIAGNOSIS — N186 End stage renal disease: Secondary | ICD-10-CM | POA: Diagnosis not present

## 2015-06-08 DIAGNOSIS — N2581 Secondary hyperparathyroidism of renal origin: Secondary | ICD-10-CM | POA: Diagnosis not present

## 2015-06-08 DIAGNOSIS — Z992 Dependence on renal dialysis: Secondary | ICD-10-CM | POA: Diagnosis not present

## 2015-06-10 DIAGNOSIS — M170 Bilateral primary osteoarthritis of knee: Secondary | ICD-10-CM | POA: Diagnosis not present

## 2015-06-10 DIAGNOSIS — N186 End stage renal disease: Secondary | ICD-10-CM | POA: Diagnosis not present

## 2015-06-10 DIAGNOSIS — M6281 Muscle weakness (generalized): Secondary | ICD-10-CM | POA: Diagnosis not present

## 2015-06-10 DIAGNOSIS — R2689 Other abnormalities of gait and mobility: Secondary | ICD-10-CM | POA: Diagnosis not present

## 2015-06-10 DIAGNOSIS — I120 Hypertensive chronic kidney disease with stage 5 chronic kidney disease or end stage renal disease: Secondary | ICD-10-CM | POA: Diagnosis not present

## 2015-06-10 DIAGNOSIS — I4891 Unspecified atrial fibrillation: Secondary | ICD-10-CM | POA: Diagnosis not present

## 2015-06-11 DIAGNOSIS — N2581 Secondary hyperparathyroidism of renal origin: Secondary | ICD-10-CM | POA: Diagnosis not present

## 2015-06-11 DIAGNOSIS — Z992 Dependence on renal dialysis: Secondary | ICD-10-CM | POA: Diagnosis not present

## 2015-06-11 DIAGNOSIS — N186 End stage renal disease: Secondary | ICD-10-CM | POA: Diagnosis not present

## 2015-06-12 DIAGNOSIS — M170 Bilateral primary osteoarthritis of knee: Secondary | ICD-10-CM | POA: Diagnosis not present

## 2015-06-12 DIAGNOSIS — I120 Hypertensive chronic kidney disease with stage 5 chronic kidney disease or end stage renal disease: Secondary | ICD-10-CM | POA: Diagnosis not present

## 2015-06-12 DIAGNOSIS — N186 End stage renal disease: Secondary | ICD-10-CM | POA: Diagnosis not present

## 2015-06-12 DIAGNOSIS — I4891 Unspecified atrial fibrillation: Secondary | ICD-10-CM | POA: Diagnosis not present

## 2015-06-12 DIAGNOSIS — M6281 Muscle weakness (generalized): Secondary | ICD-10-CM | POA: Diagnosis not present

## 2015-06-12 DIAGNOSIS — R2689 Other abnormalities of gait and mobility: Secondary | ICD-10-CM | POA: Diagnosis not present

## 2015-06-13 DIAGNOSIS — N186 End stage renal disease: Secondary | ICD-10-CM | POA: Diagnosis not present

## 2015-06-13 DIAGNOSIS — N2581 Secondary hyperparathyroidism of renal origin: Secondary | ICD-10-CM | POA: Diagnosis not present

## 2015-06-13 DIAGNOSIS — Z992 Dependence on renal dialysis: Secondary | ICD-10-CM | POA: Diagnosis not present

## 2015-06-15 DIAGNOSIS — N2581 Secondary hyperparathyroidism of renal origin: Secondary | ICD-10-CM | POA: Diagnosis not present

## 2015-06-15 DIAGNOSIS — N186 End stage renal disease: Secondary | ICD-10-CM | POA: Diagnosis not present

## 2015-06-15 DIAGNOSIS — Z992 Dependence on renal dialysis: Secondary | ICD-10-CM | POA: Diagnosis not present

## 2015-06-18 DIAGNOSIS — N2581 Secondary hyperparathyroidism of renal origin: Secondary | ICD-10-CM | POA: Diagnosis not present

## 2015-06-18 DIAGNOSIS — N186 End stage renal disease: Secondary | ICD-10-CM | POA: Diagnosis not present

## 2015-06-18 DIAGNOSIS — Z992 Dependence on renal dialysis: Secondary | ICD-10-CM | POA: Diagnosis not present

## 2015-06-19 DIAGNOSIS — N186 End stage renal disease: Secondary | ICD-10-CM | POA: Diagnosis not present

## 2015-06-19 DIAGNOSIS — Z992 Dependence on renal dialysis: Secondary | ICD-10-CM | POA: Diagnosis not present

## 2015-06-20 DIAGNOSIS — Z992 Dependence on renal dialysis: Secondary | ICD-10-CM | POA: Diagnosis not present

## 2015-06-20 DIAGNOSIS — Z23 Encounter for immunization: Secondary | ICD-10-CM | POA: Diagnosis not present

## 2015-06-20 DIAGNOSIS — D509 Iron deficiency anemia, unspecified: Secondary | ICD-10-CM | POA: Diagnosis not present

## 2015-06-20 DIAGNOSIS — N2581 Secondary hyperparathyroidism of renal origin: Secondary | ICD-10-CM | POA: Diagnosis not present

## 2015-06-20 DIAGNOSIS — N186 End stage renal disease: Secondary | ICD-10-CM | POA: Diagnosis not present

## 2015-06-21 DIAGNOSIS — I4891 Unspecified atrial fibrillation: Secondary | ICD-10-CM | POA: Diagnosis not present

## 2015-06-21 DIAGNOSIS — M170 Bilateral primary osteoarthritis of knee: Secondary | ICD-10-CM | POA: Diagnosis not present

## 2015-06-21 DIAGNOSIS — R2689 Other abnormalities of gait and mobility: Secondary | ICD-10-CM | POA: Diagnosis not present

## 2015-06-21 DIAGNOSIS — M6281 Muscle weakness (generalized): Secondary | ICD-10-CM | POA: Diagnosis not present

## 2015-06-21 DIAGNOSIS — I120 Hypertensive chronic kidney disease with stage 5 chronic kidney disease or end stage renal disease: Secondary | ICD-10-CM | POA: Diagnosis not present

## 2015-06-21 DIAGNOSIS — N186 End stage renal disease: Secondary | ICD-10-CM | POA: Diagnosis not present

## 2015-06-22 DIAGNOSIS — N2581 Secondary hyperparathyroidism of renal origin: Secondary | ICD-10-CM | POA: Diagnosis not present

## 2015-06-22 DIAGNOSIS — D509 Iron deficiency anemia, unspecified: Secondary | ICD-10-CM | POA: Diagnosis not present

## 2015-06-22 DIAGNOSIS — Z23 Encounter for immunization: Secondary | ICD-10-CM | POA: Diagnosis not present

## 2015-06-22 DIAGNOSIS — Z992 Dependence on renal dialysis: Secondary | ICD-10-CM | POA: Diagnosis not present

## 2015-06-22 DIAGNOSIS — N186 End stage renal disease: Secondary | ICD-10-CM | POA: Diagnosis not present

## 2015-06-24 DIAGNOSIS — N186 End stage renal disease: Secondary | ICD-10-CM | POA: Diagnosis not present

## 2015-06-24 DIAGNOSIS — M170 Bilateral primary osteoarthritis of knee: Secondary | ICD-10-CM | POA: Diagnosis not present

## 2015-06-24 DIAGNOSIS — I120 Hypertensive chronic kidney disease with stage 5 chronic kidney disease or end stage renal disease: Secondary | ICD-10-CM | POA: Diagnosis not present

## 2015-06-24 DIAGNOSIS — I4891 Unspecified atrial fibrillation: Secondary | ICD-10-CM | POA: Diagnosis not present

## 2015-06-24 DIAGNOSIS — M6281 Muscle weakness (generalized): Secondary | ICD-10-CM | POA: Diagnosis not present

## 2015-06-24 DIAGNOSIS — R2689 Other abnormalities of gait and mobility: Secondary | ICD-10-CM | POA: Diagnosis not present

## 2015-06-25 DIAGNOSIS — N186 End stage renal disease: Secondary | ICD-10-CM | POA: Diagnosis not present

## 2015-06-25 DIAGNOSIS — D509 Iron deficiency anemia, unspecified: Secondary | ICD-10-CM | POA: Diagnosis not present

## 2015-06-25 DIAGNOSIS — Z992 Dependence on renal dialysis: Secondary | ICD-10-CM | POA: Diagnosis not present

## 2015-06-25 DIAGNOSIS — N2581 Secondary hyperparathyroidism of renal origin: Secondary | ICD-10-CM | POA: Diagnosis not present

## 2015-06-25 DIAGNOSIS — Z23 Encounter for immunization: Secondary | ICD-10-CM | POA: Diagnosis not present

## 2015-06-26 ENCOUNTER — Encounter: Payer: Self-pay | Admitting: Cardiology

## 2015-06-26 ENCOUNTER — Ambulatory Visit (INDEPENDENT_AMBULATORY_CARE_PROVIDER_SITE_OTHER): Payer: Medicare Other | Admitting: Cardiology

## 2015-06-26 VITALS — BP 100/62 | HR 80 | Ht 68.0 in | Wt 193.0 lb

## 2015-06-26 DIAGNOSIS — M170 Bilateral primary osteoarthritis of knee: Secondary | ICD-10-CM | POA: Diagnosis not present

## 2015-06-26 DIAGNOSIS — I429 Cardiomyopathy, unspecified: Secondary | ICD-10-CM

## 2015-06-26 DIAGNOSIS — R2689 Other abnormalities of gait and mobility: Secondary | ICD-10-CM | POA: Diagnosis not present

## 2015-06-26 DIAGNOSIS — I2510 Atherosclerotic heart disease of native coronary artery without angina pectoris: Secondary | ICD-10-CM

## 2015-06-26 DIAGNOSIS — I10 Essential (primary) hypertension: Secondary | ICD-10-CM | POA: Diagnosis not present

## 2015-06-26 DIAGNOSIS — I482 Chronic atrial fibrillation, unspecified: Secondary | ICD-10-CM

## 2015-06-26 DIAGNOSIS — I495 Sick sinus syndrome: Secondary | ICD-10-CM

## 2015-06-26 DIAGNOSIS — M6281 Muscle weakness (generalized): Secondary | ICD-10-CM | POA: Diagnosis not present

## 2015-06-26 DIAGNOSIS — M159 Polyosteoarthritis, unspecified: Secondary | ICD-10-CM | POA: Diagnosis not present

## 2015-06-26 DIAGNOSIS — I4891 Unspecified atrial fibrillation: Secondary | ICD-10-CM | POA: Diagnosis not present

## 2015-06-26 DIAGNOSIS — I120 Hypertensive chronic kidney disease with stage 5 chronic kidney disease or end stage renal disease: Secondary | ICD-10-CM | POA: Diagnosis not present

## 2015-06-26 DIAGNOSIS — N186 End stage renal disease: Secondary | ICD-10-CM | POA: Diagnosis not present

## 2015-06-26 NOTE — Patient Instructions (Signed)
Your physician recommends that you continue on your current medications as directed. Please refer to the Current Medication list given to you today. Your physician recommends that you schedule a follow-up appointment in: 4 months. You will receive a reminder letter in the mail in about 2 months reminding you to call and schedule your appointment. If you don't receive this letter, please contact our office. 

## 2015-06-26 NOTE — Progress Notes (Signed)
Cardiology Office Note  Date: 06/26/2015   ID: Dale Torres, DOB Jan 22, 1925, MRN [id number]  PCP: Monico Blitz, MD  Primary Cardiologist: Rozann Lesches, MD   Chief Complaint  Patient presents with  . Atrial Fibrillation    History of Present Illness: Dale Torres is an 82 y.o. male last seen in February. He presents today with his son for a follow-up visit. Overall reasonably stable, states that he is tolerating regular hemodialysis, although still has limitations related to hypotension. He uses midodrine on the days that he dialyzes, and holds atenolol.  Interval visit with Dr. Rayann Heman noted in April. He was noted to be in persistent atrial fibrillation since November 2016 based on device interrogation. He has declined anticoagulation. Dr. Rayann Heman agreed that amiodarone should be stopped in light of being ineffective for rhythm control. He has continued on atenolol.  I reviewed his medications.  Past Medical History  Diagnosis Date  . Sick sinus syndrome [facility])     Status post pacemaker placement 2008  . Persistent atrial fibrillation (HCC)     Declines coumadin  . Essential hypertension, benign   . Benign prostatic hypertrophy   . ESRD on hemodialysis ([place])   . Coronary atherosclerosis of native coronary artery     Nonobstructive 2009  . Anemia     Dr. Sonny Dandy  . Hypothyroidism   . HOH (hard of hearing)     Bilateral  hearing aids  . History of pneumonia   . GERD (gastroesophageal reflux disease)   . Headache   . Carpal tunnel syndrome of left wrist   . Arthritis   . Skin cancer, basal cell   . Presence of permanent cardiac pacemaker     Past Surgical History  Procedure Laterality Date  . Laminectomy    . Appendectomy    . Cholecystectomy    . Eye surgery    . Right rotator cuff repair    . Pacemaker placement  2008    [employer]tr[employer] - Dr. Doreatha Lew  . Pacemaker generator change  03/03/11    MDT Adaptal L implanted by Dr Rayann Heman  . Permanent pacemaker generator  change N/A 03/03/2011    Procedure: PERMANENT PACEMAKER GENERATOR CHANGE;  Surgeon: Thompson Grayer, MD;  Location: [facility] CATH LAB;  Service: Cardiovascular;  Laterality: N/A;  . Tonsillectomy    . Colonoscopy w/ biopsies and polypectomy    . Av fistula placement Left 08/16/2014    Procedure: Left Arm creation of Brachiocephalic ARTERIOVENOUS (AV) FISTULA ;  Surgeon: Angelia Mould, MD;  Location: [place];  Service: Vascular;  Laterality: Left;  . Peripheral vascular catheterization Left 03/06/2015    Procedure: Fistulagram;  Surgeon: Serafina Mitchell, MD;  Location: [place] CV LAB;  Service: Cardiovascular;  Laterality: Left;  . Thrombectomy and revision of arterioventous (av) goretex  graft Left 03/11/2015    Procedure: THROMBECTOMY AND REVISION OF ARTERIOVENTOUS (AV) GORETEX  GRAFT LEFT ARM;  Surgeon: Rosetta Posner, MD;  Location: [facility] OR;  Service: Vascular;  Laterality: Left;    Current Outpatient Prescriptions  Medication Sig Dispense Refill  . aspirin 81 MG tablet Take 81 mg by mouth daily.      Marland Kitchen atenolol (TENORMIN) 25 MG tablet Take 25 mg by mouth daily. Will take one tab every other day on non dialysis days.    . calcitRIOL (ROCALTROL) 0.5 MCG capsule Take 0.5 mcg by mouth daily. Reported on 02/13/2015    . Cholecalciferol (VITAMIN D3) 1000 units CAPS Take 1,000 Units  by mouth daily.    Marland Kitchen docusate sodium (COLACE) 100 MG capsule Take 100 mg by mouth as needed for constipation.    . fluticasone furoate-vilanterol (BREO ELLIPTA) 200-25 MCG/INH AEPB Inhale 1 puff into the lungs daily.    . folic acid (FOLVITE) 1 MG tablet Take 1 mg by mouth daily.      . furosemide (LASIX) 80 MG tablet Take 80 mg by mouth daily.     Marland Kitchen gabapentin (NEURONTIN) 400 MG capsule Take 400 mg by mouth daily.      Marland Kitchen HYDROcodone-acetaminophen (NORCO) 10-325 MG tablet Take 1 tablet by mouth every 6 (six) hours as needed for severe pain. 30 tablet 0  . levothyroxine (SYNTHROID, LEVOTHROID) 112 MCG tablet Take 112 mcg by  mouth daily.    . midodrine (PROAMATINE) 10 MG tablet Take 1 tablet by mouth 2 (two) times daily.    . nitroGLYCERIN (NITROSTAT) 0.4 MG SL tablet Place 1 tablet (0.4 mg total) under the tongue every 5 (five) minutes as needed. (Patient taking differently: Place 0.4 mg under the tongue every 5 (five) minutes as needed for chest pain. ) 25 tablet 2  . omeprazole (PRILOSEC) 20 MG capsule Take 20 mg by mouth daily.       No current facility-administered medications for this visit.   Allergies:  Oxycodone hcl   Social History: The patient  reports that he quit smoking about 55 years ago. His smoking use included Cigarettes. He started smoking about 67 years ago. He has a 9.6 pack-year smoking history. He quit smokeless tobacco use about 35 years ago. His smokeless tobacco use included Chew. He reports that he drinks alcohol. He reports that he does not use illicit drugs.   ROS:  Please see the history of present illness. Otherwise, complete review of systems is positive for decreased hearing, arthritic stiffness, uses a rolling walker.  All other systems are reviewed and negative.   Physical Exam: VS:  BP 100/62 mmHg  Pulse 80  Ht 5\' 8"  (1.727 m)  Wt 193 lb (87.544 kg)  BMI 29.35 kg/m2  SpO2 89%, BMI Body mass index is 29.35 kg/(m^2).  Wt Readings from Last 3 Encounters:  06/26/15 193 lb (87.544 kg)  04/26/15 196 lb (88.905 kg)  03/06/15 198 lb (89.812 kg)    General: Elderly male, appears comfortable at rest. Has rolling walker. HEENT: Conjunctiva and lids normal, oropharynx clear. Neck: Supple, no elevated JVP or carotid bruits, no thyromegaly. Lungs: Clear to auscultation, nonlabored breathing at rest. Cardiac: Irregularly irregular, no S3, soft systolic murmur, no pericardial rub. Abdomen: Soft, nontender, bowel sounds present. Extremities: Mild ankle edema, distal pulses 2+. Left arm AV fistula.  ECG: I reviewed his tracing from 11/30/2014 [facility]) which reveals atrial  fibrillation with intermittent ventricular pacing, lead motion artifact noted.  Recent Labwork: 03/06/2015: BUN 66*; Creatinine, Ser 6.30* 03/11/2015: Hemoglobin 12.2*; Potassium 4.3; Sodium 141   Other Studies Reviewed Today:  Lexiscan Cardiolite June 2014: Low risk with possible inferior scar and minor peri-infarct ischemia, LVEF 43%.   Echocardiogram 06/29/2012: Study Conclusions  - Left ventricle: The cavity size was normal. There was mild concentric hypertrophy. Systolic function was mildly to moderately reduced. The estimated ejection fraction was in the range of 40% to 45%. There is hypokinesis of the basal-midinferior myocardium. There is hypokinesis of the mid-distalanteroseptal myocardium. Doppler parameters are consistent with abnormal left ventricular relaxation (grade 1 diastolic dysfunction). - Aortic valve: Trileaflet; mildly thickened leaflets. Trivial regurgitation. Mean gradient: 69mm Hg (S). Valve  area: 2.67cm^2(VTI). - Mitral valve: Mild regurgitation. - Left atrium: The atrium was moderately dilated. - Right ventricle: Pacer wire or catheter noted in right ventricle. - Right atrium: Central venous pressure: 29mm Hg (est). - Tricuspid valve: Mild regurgitation. - Pulmonic valve: Mild regurgitation. - Pulmonary arteries: PA peak pressure: 40mm Hg (S). - Pericardium, extracardiac: There was no pericardial effusion.  Assessment and Plan:  1. Chronic atrial fibrillation, continue with heart rate control strategy on atenolol. Amiodarone was discontinued following last device check with Dr. Rayann Heman. He declines anticoagulation.  2. Sick sinus syndrome status post Medtronic pacemaker, followed by Dr. Rayann Heman.  3. Nonobstructive CAD based on prior evaluation, no active angina symptoms.  4. Cardiomyopathy with LVEF 40-45%, medical therapy is limited by hypotension.  Current medicines were reviewed with the patient today.  Disposition: FU with  me in 4 months.   Signed, Satira Sark, MD, [facility] 06/26/2015 9:43 AM    [facility] at [address], [place], [place] [address] Phone: [phone]; Fax: [phone]

## 2015-06-27 DIAGNOSIS — D509 Iron deficiency anemia, unspecified: Secondary | ICD-10-CM | POA: Diagnosis not present

## 2015-06-27 DIAGNOSIS — Z23 Encounter for immunization: Secondary | ICD-10-CM | POA: Diagnosis not present

## 2015-06-27 DIAGNOSIS — Z992 Dependence on renal dialysis: Secondary | ICD-10-CM | POA: Diagnosis not present

## 2015-06-27 DIAGNOSIS — N2581 Secondary hyperparathyroidism of renal origin: Secondary | ICD-10-CM | POA: Diagnosis not present

## 2015-06-27 DIAGNOSIS — N186 End stage renal disease: Secondary | ICD-10-CM | POA: Diagnosis not present

## 2015-06-29 DIAGNOSIS — N2581 Secondary hyperparathyroidism of renal origin: Secondary | ICD-10-CM | POA: Diagnosis not present

## 2015-06-29 DIAGNOSIS — Z992 Dependence on renal dialysis: Secondary | ICD-10-CM | POA: Diagnosis not present

## 2015-06-29 DIAGNOSIS — N186 End stage renal disease: Secondary | ICD-10-CM | POA: Diagnosis not present

## 2015-06-29 DIAGNOSIS — Z23 Encounter for immunization: Secondary | ICD-10-CM | POA: Diagnosis not present

## 2015-06-29 DIAGNOSIS — D509 Iron deficiency anemia, unspecified: Secondary | ICD-10-CM | POA: Diagnosis not present

## 2015-07-01 DIAGNOSIS — M6281 Muscle weakness (generalized): Secondary | ICD-10-CM | POA: Diagnosis not present

## 2015-07-01 DIAGNOSIS — M170 Bilateral primary osteoarthritis of knee: Secondary | ICD-10-CM | POA: Diagnosis not present

## 2015-07-01 DIAGNOSIS — I120 Hypertensive chronic kidney disease with stage 5 chronic kidney disease or end stage renal disease: Secondary | ICD-10-CM | POA: Diagnosis not present

## 2015-07-01 DIAGNOSIS — R2689 Other abnormalities of gait and mobility: Secondary | ICD-10-CM | POA: Diagnosis not present

## 2015-07-01 DIAGNOSIS — N186 End stage renal disease: Secondary | ICD-10-CM | POA: Diagnosis not present

## 2015-07-01 DIAGNOSIS — I4891 Unspecified atrial fibrillation: Secondary | ICD-10-CM | POA: Diagnosis not present

## 2015-07-02 DIAGNOSIS — Z23 Encounter for immunization: Secondary | ICD-10-CM | POA: Diagnosis not present

## 2015-07-02 DIAGNOSIS — N2581 Secondary hyperparathyroidism of renal origin: Secondary | ICD-10-CM | POA: Diagnosis not present

## 2015-07-02 DIAGNOSIS — N186 End stage renal disease: Secondary | ICD-10-CM | POA: Diagnosis not present

## 2015-07-02 DIAGNOSIS — D509 Iron deficiency anemia, unspecified: Secondary | ICD-10-CM | POA: Diagnosis not present

## 2015-07-02 DIAGNOSIS — Z992 Dependence on renal dialysis: Secondary | ICD-10-CM | POA: Diagnosis not present

## 2015-07-03 DIAGNOSIS — N186 End stage renal disease: Secondary | ICD-10-CM | POA: Diagnosis not present

## 2015-07-03 DIAGNOSIS — N189 Chronic kidney disease, unspecified: Secondary | ICD-10-CM | POA: Diagnosis not present

## 2015-07-03 DIAGNOSIS — I10 Essential (primary) hypertension: Secondary | ICD-10-CM | POA: Diagnosis not present

## 2015-07-03 DIAGNOSIS — R2689 Other abnormalities of gait and mobility: Secondary | ICD-10-CM | POA: Diagnosis not present

## 2015-07-03 DIAGNOSIS — I4891 Unspecified atrial fibrillation: Secondary | ICD-10-CM | POA: Diagnosis not present

## 2015-07-03 DIAGNOSIS — M6281 Muscle weakness (generalized): Secondary | ICD-10-CM | POA: Diagnosis not present

## 2015-07-03 DIAGNOSIS — M170 Bilateral primary osteoarthritis of knee: Secondary | ICD-10-CM | POA: Diagnosis not present

## 2015-07-03 DIAGNOSIS — I120 Hypertensive chronic kidney disease with stage 5 chronic kidney disease or end stage renal disease: Secondary | ICD-10-CM | POA: Diagnosis not present

## 2015-07-04 DIAGNOSIS — N2581 Secondary hyperparathyroidism of renal origin: Secondary | ICD-10-CM | POA: Diagnosis not present

## 2015-07-04 DIAGNOSIS — N186 End stage renal disease: Secondary | ICD-10-CM | POA: Diagnosis not present

## 2015-07-04 DIAGNOSIS — D509 Iron deficiency anemia, unspecified: Secondary | ICD-10-CM | POA: Diagnosis not present

## 2015-07-04 DIAGNOSIS — Z23 Encounter for immunization: Secondary | ICD-10-CM | POA: Diagnosis not present

## 2015-07-04 DIAGNOSIS — Z992 Dependence on renal dialysis: Secondary | ICD-10-CM | POA: Diagnosis not present

## 2015-07-06 DIAGNOSIS — N2581 Secondary hyperparathyroidism of renal origin: Secondary | ICD-10-CM | POA: Diagnosis not present

## 2015-07-06 DIAGNOSIS — D509 Iron deficiency anemia, unspecified: Secondary | ICD-10-CM | POA: Diagnosis not present

## 2015-07-06 DIAGNOSIS — Z992 Dependence on renal dialysis: Secondary | ICD-10-CM | POA: Diagnosis not present

## 2015-07-06 DIAGNOSIS — N186 End stage renal disease: Secondary | ICD-10-CM | POA: Diagnosis not present

## 2015-07-06 DIAGNOSIS — Z23 Encounter for immunization: Secondary | ICD-10-CM | POA: Diagnosis not present

## 2015-07-09 DIAGNOSIS — Z23 Encounter for immunization: Secondary | ICD-10-CM | POA: Diagnosis not present

## 2015-07-09 DIAGNOSIS — D509 Iron deficiency anemia, unspecified: Secondary | ICD-10-CM | POA: Diagnosis not present

## 2015-07-09 DIAGNOSIS — N2581 Secondary hyperparathyroidism of renal origin: Secondary | ICD-10-CM | POA: Diagnosis not present

## 2015-07-09 DIAGNOSIS — N186 End stage renal disease: Secondary | ICD-10-CM | POA: Diagnosis not present

## 2015-07-09 DIAGNOSIS — Z992 Dependence on renal dialysis: Secondary | ICD-10-CM | POA: Diagnosis not present

## 2015-07-10 DIAGNOSIS — N186 End stage renal disease: Secondary | ICD-10-CM | POA: Diagnosis not present

## 2015-07-10 DIAGNOSIS — M6281 Muscle weakness (generalized): Secondary | ICD-10-CM | POA: Diagnosis not present

## 2015-07-10 DIAGNOSIS — I120 Hypertensive chronic kidney disease with stage 5 chronic kidney disease or end stage renal disease: Secondary | ICD-10-CM | POA: Diagnosis not present

## 2015-07-10 DIAGNOSIS — M170 Bilateral primary osteoarthritis of knee: Secondary | ICD-10-CM | POA: Diagnosis not present

## 2015-07-10 DIAGNOSIS — R2689 Other abnormalities of gait and mobility: Secondary | ICD-10-CM | POA: Diagnosis not present

## 2015-07-10 DIAGNOSIS — I4891 Unspecified atrial fibrillation: Secondary | ICD-10-CM | POA: Diagnosis not present

## 2015-07-11 DIAGNOSIS — Z992 Dependence on renal dialysis: Secondary | ICD-10-CM | POA: Diagnosis not present

## 2015-07-11 DIAGNOSIS — Z23 Encounter for immunization: Secondary | ICD-10-CM | POA: Diagnosis not present

## 2015-07-11 DIAGNOSIS — N2581 Secondary hyperparathyroidism of renal origin: Secondary | ICD-10-CM | POA: Diagnosis not present

## 2015-07-11 DIAGNOSIS — D509 Iron deficiency anemia, unspecified: Secondary | ICD-10-CM | POA: Diagnosis not present

## 2015-07-11 DIAGNOSIS — N186 End stage renal disease: Secondary | ICD-10-CM | POA: Diagnosis not present

## 2015-07-13 DIAGNOSIS — N186 End stage renal disease: Secondary | ICD-10-CM | POA: Diagnosis not present

## 2015-07-13 DIAGNOSIS — N2581 Secondary hyperparathyroidism of renal origin: Secondary | ICD-10-CM | POA: Diagnosis not present

## 2015-07-13 DIAGNOSIS — D509 Iron deficiency anemia, unspecified: Secondary | ICD-10-CM | POA: Diagnosis not present

## 2015-07-13 DIAGNOSIS — Z23 Encounter for immunization: Secondary | ICD-10-CM | POA: Diagnosis not present

## 2015-07-13 DIAGNOSIS — Z992 Dependence on renal dialysis: Secondary | ICD-10-CM | POA: Diagnosis not present

## 2015-07-16 DIAGNOSIS — Z23 Encounter for immunization: Secondary | ICD-10-CM | POA: Diagnosis not present

## 2015-07-16 DIAGNOSIS — N2581 Secondary hyperparathyroidism of renal origin: Secondary | ICD-10-CM | POA: Diagnosis not present

## 2015-07-16 DIAGNOSIS — N186 End stage renal disease: Secondary | ICD-10-CM | POA: Diagnosis not present

## 2015-07-16 DIAGNOSIS — Z992 Dependence on renal dialysis: Secondary | ICD-10-CM | POA: Diagnosis not present

## 2015-07-16 DIAGNOSIS — D509 Iron deficiency anemia, unspecified: Secondary | ICD-10-CM | POA: Diagnosis not present

## 2015-07-18 DIAGNOSIS — Z23 Encounter for immunization: Secondary | ICD-10-CM | POA: Diagnosis not present

## 2015-07-18 DIAGNOSIS — N2581 Secondary hyperparathyroidism of renal origin: Secondary | ICD-10-CM | POA: Diagnosis not present

## 2015-07-18 DIAGNOSIS — D509 Iron deficiency anemia, unspecified: Secondary | ICD-10-CM | POA: Diagnosis not present

## 2015-07-18 DIAGNOSIS — Z992 Dependence on renal dialysis: Secondary | ICD-10-CM | POA: Diagnosis not present

## 2015-07-18 DIAGNOSIS — N186 End stage renal disease: Secondary | ICD-10-CM | POA: Diagnosis not present

## 2015-07-19 DIAGNOSIS — N186 End stage renal disease: Secondary | ICD-10-CM | POA: Diagnosis not present

## 2015-07-19 DIAGNOSIS — Z992 Dependence on renal dialysis: Secondary | ICD-10-CM | POA: Diagnosis not present

## 2015-07-20 DIAGNOSIS — D509 Iron deficiency anemia, unspecified: Secondary | ICD-10-CM | POA: Diagnosis not present

## 2015-07-20 DIAGNOSIS — Z992 Dependence on renal dialysis: Secondary | ICD-10-CM | POA: Diagnosis not present

## 2015-07-20 DIAGNOSIS — D631 Anemia in chronic kidney disease: Secondary | ICD-10-CM | POA: Diagnosis not present

## 2015-07-20 DIAGNOSIS — N186 End stage renal disease: Secondary | ICD-10-CM | POA: Diagnosis not present

## 2015-07-23 DIAGNOSIS — D631 Anemia in chronic kidney disease: Secondary | ICD-10-CM | POA: Diagnosis not present

## 2015-07-23 DIAGNOSIS — Z992 Dependence on renal dialysis: Secondary | ICD-10-CM | POA: Diagnosis not present

## 2015-07-23 DIAGNOSIS — D509 Iron deficiency anemia, unspecified: Secondary | ICD-10-CM | POA: Diagnosis not present

## 2015-07-23 DIAGNOSIS — N186 End stage renal disease: Secondary | ICD-10-CM | POA: Diagnosis not present

## 2015-07-25 DIAGNOSIS — Z992 Dependence on renal dialysis: Secondary | ICD-10-CM | POA: Diagnosis not present

## 2015-07-25 DIAGNOSIS — N186 End stage renal disease: Secondary | ICD-10-CM | POA: Diagnosis not present

## 2015-07-25 DIAGNOSIS — D509 Iron deficiency anemia, unspecified: Secondary | ICD-10-CM | POA: Diagnosis not present

## 2015-07-25 DIAGNOSIS — D631 Anemia in chronic kidney disease: Secondary | ICD-10-CM | POA: Diagnosis not present

## 2015-07-27 DIAGNOSIS — Z992 Dependence on renal dialysis: Secondary | ICD-10-CM | POA: Diagnosis not present

## 2015-07-27 DIAGNOSIS — N186 End stage renal disease: Secondary | ICD-10-CM | POA: Diagnosis not present

## 2015-07-27 DIAGNOSIS — D631 Anemia in chronic kidney disease: Secondary | ICD-10-CM | POA: Diagnosis not present

## 2015-07-27 DIAGNOSIS — D509 Iron deficiency anemia, unspecified: Secondary | ICD-10-CM | POA: Diagnosis not present

## 2015-07-29 ENCOUNTER — Ambulatory Visit (INDEPENDENT_AMBULATORY_CARE_PROVIDER_SITE_OTHER): Payer: Medicare Other | Admitting: *Deleted

## 2015-07-29 ENCOUNTER — Telehealth: Payer: Self-pay | Admitting: Cardiology

## 2015-07-29 DIAGNOSIS — I495 Sick sinus syndrome: Secondary | ICD-10-CM | POA: Diagnosis not present

## 2015-07-29 NOTE — Telephone Encounter (Signed)
Spoke with pt and reminded pt of remote transmission that is due today. Pt verbalized understanding.   

## 2015-07-30 DIAGNOSIS — D509 Iron deficiency anemia, unspecified: Secondary | ICD-10-CM | POA: Diagnosis not present

## 2015-07-30 DIAGNOSIS — D631 Anemia in chronic kidney disease: Secondary | ICD-10-CM | POA: Diagnosis not present

## 2015-07-30 DIAGNOSIS — N186 End stage renal disease: Secondary | ICD-10-CM | POA: Diagnosis not present

## 2015-07-30 DIAGNOSIS — Z992 Dependence on renal dialysis: Secondary | ICD-10-CM | POA: Diagnosis not present

## 2015-07-30 LAB — CUP PACEART REMOTE DEVICE CHECK
Battery Impedance: 210 Ohm
Battery Remaining Longevity: 123 mo
Battery Voltage: 2.79 V
Brady Statistic AP VS Percent: 3 %
Date Time Interrogation Session: 20170710200003
Implantable Lead Implant Date: 20080604
Implantable Lead Location: 753859
Implantable Lead Model: 4469
Implantable Lead Model: 4470
Implantable Lead Serial Number: 498205
Lead Channel Impedance Value: 442 Ohm
Lead Channel Pacing Threshold Pulse Width: 0.4 ms
Lead Channel Sensing Intrinsic Amplitude: 0.7 mV
Lead Channel Setting Pacing Amplitude: 2.5 V
Lead Channel Setting Sensing Sensitivity: 4 mV
MDC IDC LEAD IMPLANT DT: 20080604
MDC IDC LEAD LOCATION: 753860
MDC IDC LEAD SERIAL: 601713
MDC IDC MSMT LEADCHNL RA IMPEDANCE VALUE: 475 Ohm
MDC IDC MSMT LEADCHNL RA PACING THRESHOLD AMPLITUDE: 0.625 V
MDC IDC MSMT LEADCHNL RA PACING THRESHOLD PULSEWIDTH: 0.4 ms
MDC IDC MSMT LEADCHNL RV PACING THRESHOLD AMPLITUDE: 0.5 V
MDC IDC MSMT LEADCHNL RV SENSING INTR AMPL: 8 mV
MDC IDC SET LEADCHNL RA PACING AMPLITUDE: 2 V
MDC IDC SET LEADCHNL RV PACING PULSEWIDTH: 0.4 ms
MDC IDC STAT BRADY AP VP PERCENT: 3 %
MDC IDC STAT BRADY AS VP PERCENT: 0 %
MDC IDC STAT BRADY AS VS PERCENT: 94 %

## 2015-07-30 NOTE — Progress Notes (Signed)
Remote pacemaker transmission.   

## 2015-07-31 ENCOUNTER — Encounter: Payer: Self-pay | Admitting: Cardiology

## 2015-08-01 DIAGNOSIS — N186 End stage renal disease: Secondary | ICD-10-CM | POA: Diagnosis not present

## 2015-08-01 DIAGNOSIS — D631 Anemia in chronic kidney disease: Secondary | ICD-10-CM | POA: Diagnosis not present

## 2015-08-01 DIAGNOSIS — D509 Iron deficiency anemia, unspecified: Secondary | ICD-10-CM | POA: Diagnosis not present

## 2015-08-01 DIAGNOSIS — Z992 Dependence on renal dialysis: Secondary | ICD-10-CM | POA: Diagnosis not present

## 2015-08-03 DIAGNOSIS — D631 Anemia in chronic kidney disease: Secondary | ICD-10-CM | POA: Diagnosis not present

## 2015-08-03 DIAGNOSIS — Z992 Dependence on renal dialysis: Secondary | ICD-10-CM | POA: Diagnosis not present

## 2015-08-03 DIAGNOSIS — N186 End stage renal disease: Secondary | ICD-10-CM | POA: Diagnosis not present

## 2015-08-03 DIAGNOSIS — D509 Iron deficiency anemia, unspecified: Secondary | ICD-10-CM | POA: Diagnosis not present

## 2015-08-05 DIAGNOSIS — I10 Essential (primary) hypertension: Secondary | ICD-10-CM | POA: Diagnosis not present

## 2015-08-05 DIAGNOSIS — I4891 Unspecified atrial fibrillation: Secondary | ICD-10-CM | POA: Diagnosis not present

## 2015-08-05 DIAGNOSIS — M159 Polyosteoarthritis, unspecified: Secondary | ICD-10-CM | POA: Diagnosis not present

## 2015-08-06 DIAGNOSIS — Z992 Dependence on renal dialysis: Secondary | ICD-10-CM | POA: Diagnosis not present

## 2015-08-06 DIAGNOSIS — N186 End stage renal disease: Secondary | ICD-10-CM | POA: Diagnosis not present

## 2015-08-06 DIAGNOSIS — D631 Anemia in chronic kidney disease: Secondary | ICD-10-CM | POA: Diagnosis not present

## 2015-08-06 DIAGNOSIS — D509 Iron deficiency anemia, unspecified: Secondary | ICD-10-CM | POA: Diagnosis not present

## 2015-08-08 DIAGNOSIS — Z992 Dependence on renal dialysis: Secondary | ICD-10-CM | POA: Diagnosis not present

## 2015-08-08 DIAGNOSIS — D509 Iron deficiency anemia, unspecified: Secondary | ICD-10-CM | POA: Diagnosis not present

## 2015-08-08 DIAGNOSIS — N186 End stage renal disease: Secondary | ICD-10-CM | POA: Diagnosis not present

## 2015-08-08 DIAGNOSIS — D631 Anemia in chronic kidney disease: Secondary | ICD-10-CM | POA: Diagnosis not present

## 2015-08-09 DIAGNOSIS — Z7189 Other specified counseling: Secondary | ICD-10-CM | POA: Diagnosis not present

## 2015-08-09 DIAGNOSIS — Z1389 Encounter for screening for other disorder: Secondary | ICD-10-CM | POA: Diagnosis not present

## 2015-08-09 DIAGNOSIS — Z1211 Encounter for screening for malignant neoplasm of colon: Secondary | ICD-10-CM | POA: Diagnosis not present

## 2015-08-09 DIAGNOSIS — Z299 Encounter for prophylactic measures, unspecified: Secondary | ICD-10-CM | POA: Diagnosis not present

## 2015-08-09 DIAGNOSIS — Z0000 Encounter for general adult medical examination without abnormal findings: Secondary | ICD-10-CM | POA: Diagnosis not present

## 2015-08-09 DIAGNOSIS — Z6830 Body mass index (BMI) 30.0-30.9, adult: Secondary | ICD-10-CM | POA: Diagnosis not present

## 2015-08-10 DIAGNOSIS — D631 Anemia in chronic kidney disease: Secondary | ICD-10-CM | POA: Diagnosis not present

## 2015-08-10 DIAGNOSIS — N186 End stage renal disease: Secondary | ICD-10-CM | POA: Diagnosis not present

## 2015-08-10 DIAGNOSIS — D509 Iron deficiency anemia, unspecified: Secondary | ICD-10-CM | POA: Diagnosis not present

## 2015-08-10 DIAGNOSIS — Z992 Dependence on renal dialysis: Secondary | ICD-10-CM | POA: Diagnosis not present

## 2015-08-12 DIAGNOSIS — Z125 Encounter for screening for malignant neoplasm of prostate: Secondary | ICD-10-CM | POA: Diagnosis not present

## 2015-08-12 DIAGNOSIS — Z79899 Other long term (current) drug therapy: Secondary | ICD-10-CM | POA: Diagnosis not present

## 2015-08-12 DIAGNOSIS — I10 Essential (primary) hypertension: Secondary | ICD-10-CM | POA: Diagnosis not present

## 2015-08-12 DIAGNOSIS — R5383 Other fatigue: Secondary | ICD-10-CM | POA: Diagnosis not present

## 2015-08-12 DIAGNOSIS — E039 Hypothyroidism, unspecified: Secondary | ICD-10-CM | POA: Diagnosis not present

## 2015-08-13 DIAGNOSIS — Z992 Dependence on renal dialysis: Secondary | ICD-10-CM | POA: Diagnosis not present

## 2015-08-13 DIAGNOSIS — D631 Anemia in chronic kidney disease: Secondary | ICD-10-CM | POA: Diagnosis not present

## 2015-08-13 DIAGNOSIS — D509 Iron deficiency anemia, unspecified: Secondary | ICD-10-CM | POA: Diagnosis not present

## 2015-08-13 DIAGNOSIS — N186 End stage renal disease: Secondary | ICD-10-CM | POA: Diagnosis not present

## 2015-08-15 DIAGNOSIS — D631 Anemia in chronic kidney disease: Secondary | ICD-10-CM | POA: Diagnosis not present

## 2015-08-15 DIAGNOSIS — D509 Iron deficiency anemia, unspecified: Secondary | ICD-10-CM | POA: Diagnosis not present

## 2015-08-15 DIAGNOSIS — N186 End stage renal disease: Secondary | ICD-10-CM | POA: Diagnosis not present

## 2015-08-15 DIAGNOSIS — Z992 Dependence on renal dialysis: Secondary | ICD-10-CM | POA: Diagnosis not present

## 2015-08-16 DIAGNOSIS — I770 Arteriovenous fistula, acquired: Secondary | ICD-10-CM | POA: Diagnosis not present

## 2015-08-16 DIAGNOSIS — N185 Chronic kidney disease, stage 5: Secondary | ICD-10-CM | POA: Diagnosis not present

## 2015-08-16 DIAGNOSIS — Z992 Dependence on renal dialysis: Secondary | ICD-10-CM | POA: Diagnosis not present

## 2015-08-16 DIAGNOSIS — M461 Sacroiliitis, not elsewhere classified: Secondary | ICD-10-CM | POA: Diagnosis not present

## 2015-08-17 DIAGNOSIS — D509 Iron deficiency anemia, unspecified: Secondary | ICD-10-CM | POA: Diagnosis not present

## 2015-08-17 DIAGNOSIS — D631 Anemia in chronic kidney disease: Secondary | ICD-10-CM | POA: Diagnosis not present

## 2015-08-17 DIAGNOSIS — N186 End stage renal disease: Secondary | ICD-10-CM | POA: Diagnosis not present

## 2015-08-17 DIAGNOSIS — Z992 Dependence on renal dialysis: Secondary | ICD-10-CM | POA: Diagnosis not present

## 2015-08-19 DIAGNOSIS — N186 End stage renal disease: Secondary | ICD-10-CM | POA: Diagnosis not present

## 2015-08-19 DIAGNOSIS — Z992 Dependence on renal dialysis: Secondary | ICD-10-CM | POA: Diagnosis not present

## 2015-08-20 ENCOUNTER — Telehealth: Payer: Self-pay | Admitting: Cardiology

## 2015-08-20 ENCOUNTER — Encounter: Payer: Self-pay | Admitting: Family Medicine

## 2015-08-20 DIAGNOSIS — I482 Chronic atrial fibrillation: Secondary | ICD-10-CM | POA: Diagnosis not present

## 2015-08-20 DIAGNOSIS — Z886 Allergy status to analgesic agent status: Secondary | ICD-10-CM | POA: Diagnosis not present

## 2015-08-20 DIAGNOSIS — Z8249 Family history of ischemic heart disease and other diseases of the circulatory system: Secondary | ICD-10-CM | POA: Diagnosis not present

## 2015-08-20 DIAGNOSIS — R000 Tachycardia, unspecified: Secondary | ICD-10-CM | POA: Diagnosis not present

## 2015-08-20 DIAGNOSIS — N400 Enlarged prostate without lower urinary tract symptoms: Secondary | ICD-10-CM | POA: Diagnosis present

## 2015-08-20 DIAGNOSIS — Z79899 Other long term (current) drug therapy: Secondary | ICD-10-CM | POA: Diagnosis not present

## 2015-08-20 DIAGNOSIS — R918 Other nonspecific abnormal finding of lung field: Secondary | ICD-10-CM | POA: Diagnosis not present

## 2015-08-20 DIAGNOSIS — Z992 Dependence on renal dialysis: Secondary | ICD-10-CM | POA: Diagnosis not present

## 2015-08-20 DIAGNOSIS — E039 Hypothyroidism, unspecified: Secondary | ICD-10-CM | POA: Diagnosis not present

## 2015-08-20 DIAGNOSIS — D649 Anemia, unspecified: Secondary | ICD-10-CM | POA: Diagnosis present

## 2015-08-20 DIAGNOSIS — I4891 Unspecified atrial fibrillation: Secondary | ICD-10-CM | POA: Diagnosis not present

## 2015-08-20 DIAGNOSIS — Z7982 Long term (current) use of aspirin: Secondary | ICD-10-CM | POA: Diagnosis not present

## 2015-08-20 DIAGNOSIS — D509 Iron deficiency anemia, unspecified: Secondary | ICD-10-CM | POA: Diagnosis not present

## 2015-08-20 DIAGNOSIS — R2681 Unsteadiness on feet: Secondary | ICD-10-CM | POA: Diagnosis present

## 2015-08-20 DIAGNOSIS — I120 Hypertensive chronic kidney disease with stage 5 chronic kidney disease or end stage renal disease: Secondary | ICD-10-CM | POA: Diagnosis not present

## 2015-08-20 DIAGNOSIS — Z8701 Personal history of pneumonia (recurrent): Secondary | ICD-10-CM | POA: Diagnosis not present

## 2015-08-20 DIAGNOSIS — M898X9 Other specified disorders of bone, unspecified site: Secondary | ICD-10-CM | POA: Diagnosis present

## 2015-08-20 DIAGNOSIS — I2510 Atherosclerotic heart disease of native coronary artery without angina pectoris: Secondary | ICD-10-CM | POA: Diagnosis present

## 2015-08-20 DIAGNOSIS — Z950 Presence of cardiac pacemaker: Secondary | ICD-10-CM | POA: Diagnosis not present

## 2015-08-20 DIAGNOSIS — D631 Anemia in chronic kidney disease: Secondary | ICD-10-CM | POA: Diagnosis not present

## 2015-08-20 DIAGNOSIS — N186 End stage renal disease: Secondary | ICD-10-CM | POA: Diagnosis not present

## 2015-08-20 DIAGNOSIS — Z23 Encounter for immunization: Secondary | ICD-10-CM | POA: Diagnosis not present

## 2015-08-20 DIAGNOSIS — K219 Gastro-esophageal reflux disease without esophagitis: Secondary | ICD-10-CM | POA: Diagnosis present

## 2015-08-20 DIAGNOSIS — I10 Essential (primary) hypertension: Secondary | ICD-10-CM | POA: Diagnosis not present

## 2015-08-20 DIAGNOSIS — I429 Cardiomyopathy, unspecified: Secondary | ICD-10-CM | POA: Diagnosis present

## 2015-08-20 DIAGNOSIS — I959 Hypotension, unspecified: Secondary | ICD-10-CM | POA: Diagnosis not present

## 2015-08-20 NOTE — Telephone Encounter (Signed)
Dr. Harl Bowie spoke with MD.

## 2015-08-20 NOTE — Telephone Encounter (Signed)
Spoke with Dr Manuella Ghazi regarding patient. Patient presented to [facility]  ER after HD in afib with RVR and hypotensive. I recommended DCCV if patient is severely symptomatic or unstable, if stable with soft bp's can try amiodarone drip. Consider trial of IVFs, workup for infection, repeat echo.  He is to call [facility] on call cardiology if transfer is needed.    Zandra Abts MD

## 2015-08-20 NOTE — Telephone Encounter (Signed)
Dr Manuella Ghazi called asking to speak with Cardiologist in office about patient.  Did not leave message in reference to what is needed. Just asked for someone to call him back at number provided [phone]

## 2015-08-24 DIAGNOSIS — D509 Iron deficiency anemia, unspecified: Secondary | ICD-10-CM | POA: Diagnosis not present

## 2015-08-24 DIAGNOSIS — N186 End stage renal disease: Secondary | ICD-10-CM | POA: Diagnosis not present

## 2015-08-24 DIAGNOSIS — Z992 Dependence on renal dialysis: Secondary | ICD-10-CM | POA: Diagnosis not present

## 2015-08-24 DIAGNOSIS — D631 Anemia in chronic kidney disease: Secondary | ICD-10-CM | POA: Diagnosis not present

## 2015-08-24 DIAGNOSIS — Z23 Encounter for immunization: Secondary | ICD-10-CM | POA: Diagnosis not present

## 2015-08-27 DIAGNOSIS — J90 Pleural effusion, not elsewhere classified: Secondary | ICD-10-CM | POA: Diagnosis not present

## 2015-08-27 DIAGNOSIS — Z992 Dependence on renal dialysis: Secondary | ICD-10-CM | POA: Diagnosis not present

## 2015-08-27 DIAGNOSIS — D649 Anemia, unspecified: Secondary | ICD-10-CM | POA: Diagnosis present

## 2015-08-27 DIAGNOSIS — N400 Enlarged prostate without lower urinary tract symptoms: Secondary | ICD-10-CM | POA: Diagnosis present

## 2015-08-27 DIAGNOSIS — I120 Hypertensive chronic kidney disease with stage 5 chronic kidney disease or end stage renal disease: Secondary | ICD-10-CM | POA: Diagnosis not present

## 2015-08-27 DIAGNOSIS — E875 Hyperkalemia: Secondary | ICD-10-CM | POA: Diagnosis not present

## 2015-08-27 DIAGNOSIS — Z7982 Long term (current) use of aspirin: Secondary | ICD-10-CM | POA: Diagnosis not present

## 2015-08-27 DIAGNOSIS — Z79899 Other long term (current) drug therapy: Secondary | ICD-10-CM | POA: Diagnosis not present

## 2015-08-27 DIAGNOSIS — N186 End stage renal disease: Secondary | ICD-10-CM | POA: Diagnosis not present

## 2015-08-27 DIAGNOSIS — I429 Cardiomyopathy, unspecified: Secondary | ICD-10-CM | POA: Diagnosis not present

## 2015-08-27 DIAGNOSIS — I509 Heart failure, unspecified: Secondary | ICD-10-CM | POA: Diagnosis not present

## 2015-08-27 DIAGNOSIS — I482 Chronic atrial fibrillation: Secondary | ICD-10-CM | POA: Diagnosis present

## 2015-08-27 DIAGNOSIS — I2510 Atherosclerotic heart disease of native coronary artery without angina pectoris: Secondary | ICD-10-CM | POA: Diagnosis present

## 2015-08-27 DIAGNOSIS — Z950 Presence of cardiac pacemaker: Secondary | ICD-10-CM | POA: Diagnosis not present

## 2015-08-27 DIAGNOSIS — D631 Anemia in chronic kidney disease: Secondary | ICD-10-CM | POA: Diagnosis not present

## 2015-08-28 DIAGNOSIS — N186 End stage renal disease: Secondary | ICD-10-CM | POA: Diagnosis not present

## 2015-08-28 DIAGNOSIS — Z950 Presence of cardiac pacemaker: Secondary | ICD-10-CM | POA: Diagnosis not present

## 2015-08-28 DIAGNOSIS — I120 Hypertensive chronic kidney disease with stage 5 chronic kidney disease or end stage renal disease: Secondary | ICD-10-CM | POA: Diagnosis not present

## 2015-08-28 DIAGNOSIS — I429 Cardiomyopathy, unspecified: Secondary | ICD-10-CM | POA: Diagnosis not present

## 2015-08-29 DIAGNOSIS — N186 End stage renal disease: Secondary | ICD-10-CM | POA: Diagnosis not present

## 2015-08-29 DIAGNOSIS — D631 Anemia in chronic kidney disease: Secondary | ICD-10-CM | POA: Diagnosis not present

## 2015-08-29 DIAGNOSIS — Z23 Encounter for immunization: Secondary | ICD-10-CM | POA: Diagnosis not present

## 2015-08-29 DIAGNOSIS — D509 Iron deficiency anemia, unspecified: Secondary | ICD-10-CM | POA: Diagnosis not present

## 2015-08-29 DIAGNOSIS — Z992 Dependence on renal dialysis: Secondary | ICD-10-CM | POA: Diagnosis not present

## 2015-08-30 ENCOUNTER — Ambulatory Visit (INDEPENDENT_AMBULATORY_CARE_PROVIDER_SITE_OTHER): Payer: Medicare Other | Admitting: Cardiology

## 2015-08-30 ENCOUNTER — Encounter: Payer: Self-pay | Admitting: Cardiology

## 2015-08-30 DIAGNOSIS — I2510 Atherosclerotic heart disease of native coronary artery without angina pectoris: Secondary | ICD-10-CM | POA: Diagnosis not present

## 2015-08-30 DIAGNOSIS — I429 Cardiomyopathy, unspecified: Secondary | ICD-10-CM

## 2015-08-30 DIAGNOSIS — I953 Hypotension of hemodialysis: Secondary | ICD-10-CM | POA: Diagnosis not present

## 2015-08-30 DIAGNOSIS — I4891 Unspecified atrial fibrillation: Secondary | ICD-10-CM | POA: Diagnosis not present

## 2015-08-30 DIAGNOSIS — N185 Chronic kidney disease, stage 5: Secondary | ICD-10-CM | POA: Diagnosis not present

## 2015-08-30 DIAGNOSIS — Z950 Presence of cardiac pacemaker: Secondary | ICD-10-CM

## 2015-08-30 DIAGNOSIS — I959 Hypotension, unspecified: Secondary | ICD-10-CM | POA: Insufficient documentation

## 2015-08-30 DIAGNOSIS — J309 Allergic rhinitis, unspecified: Secondary | ICD-10-CM | POA: Diagnosis not present

## 2015-08-30 DIAGNOSIS — Z299 Encounter for prophylactic measures, unspecified: Secondary | ICD-10-CM | POA: Diagnosis not present

## 2015-08-30 MED ORDER — ATENOLOL 25 MG PO TABS: 12.5000 mg | ORAL_TABLET | Freq: Every day | ORAL | 6 refills | Status: DC

## 2015-08-30 NOTE — Assessment & Plan Note (Signed)
At times with RVR

## 2015-08-30 NOTE — Patient Instructions (Signed)
Your physician recommends that you schedule a follow-up appointment with Dr. Domenic Polite  Your physician has recommended you make the following change in your medication: Decrease Atenolol to 12.5 mg Daily  ( If you are unable to split the Atenolol please have written Rx for Bisprolol filled and Stop taking Atenolol)   If you need a refill on your cardiac medications before your next appointment, please call your pharmacy.  Thank you for choosing [facility]!

## 2015-08-30 NOTE — Progress Notes (Signed)
08/30/2015 Evren Abraha [facility]   Jul 22, 1925  [id number]  Primary Physician Monico Blitz, MD Primary Cardiologist: Dr Domenic Polite  HPI:  82 y/o male with a history of CAF, he has declined anticoagulation in the past per Dr Domenic Polite. He has been on dialysis since Nov 2016. Recently he has had problems with hypotension and rapid AF while on HD. He has been on Amiodarone 200 gmdaily and Atenolol 25 mg QOD (non dialysis days). He was sent to [facility] 08/20/15 with tachycardia and low B/P from the dialysis center. His Atenolol has been stopped. He was then admitted 08/27/15 with hyperkalemia. His rate is controlled today in the office and besides some LE edema he seems to be stable.    Current Outpatient Prescriptions  Medication Sig Dispense Refill  . amiodarone (PACERONE) 200 MG tablet TAKE TWO (2) TABLETS BY MOUTH TWICE DAILY.  0  . aspirin 81 MG tablet Take 81 mg by mouth daily.      . calcitRIOL (ROCALTROL) 0.5 MCG capsule Take 0.5 mcg by mouth daily. Reported on 02/13/2015    . docusate sodium (COLACE) 100 MG capsule Take 100 mg by mouth as needed for constipation.    . fluticasone furoate-vilanterol (BREO ELLIPTA) 200-25 MCG/INH AEPB Inhale 1 puff into the lungs daily.    . folic acid (FOLVITE) 1 MG tablet Take 1 mg by mouth daily.      Marland Kitchen gabapentin (NEURONTIN) 400 MG capsule Take 400 mg by mouth daily.      Marland Kitchen HYDROcodone-acetaminophen (NORCO) 10-325 MG tablet Take 1 tablet by mouth every 6 (six) hours as needed for severe pain. 30 tablet 0  . levothyroxine (SYNTHROID, LEVOTHROID) 112 MCG tablet Take 112 mcg by mouth daily.    . midodrine (PROAMATINE) 10 MG tablet Take 1 tablet by mouth 2 (two) times daily.    . nitroGLYCERIN (NITROSTAT) 0.4 MG SL tablet Place 1 tablet (0.4 mg total) under the tongue every 5 (five) minutes as needed. (Patient taking differently: Place 0.4 mg under the tongue every 5 (five) minutes as needed for chest pain. ) 25 tablet 2  . omeprazole (PRILOSEC) 20 MG capsule  Take 20 mg by mouth daily.      Marland Kitchen atenolol (TENORMIN) 25 MG tablet Take 0.5 tablets (12.5 mg total) by mouth daily. 30 tablet 6   No current facility-administered medications for this visit.     Allergies  Allergen Reactions  . Oxycodone Hcl Rash    Social History   Social History  . Marital status: Widowed    Spouse name: N/A  . Number of children: N/A  . Years of education: N/A   Occupational History  . RETIRED    Social History Main Topics  . Smoking status: Former Smoker    Packs/day: 0.80    Years: 12.00    Types: Cigarettes    Start date: 01/20/1948    Quit date: 01/20/1960  . Smokeless tobacco: Former Systems developer    Types: Chew    Quit date: 01/20/1980     Comment: chewed tobacco x's 10 years while playing golf only,used a pack per week  . Alcohol use 0.0 oz/week     Comment: Occasional glass of wine  . Drug use: No  . Sexual activity: Not Currently   Other Topics Concern  . Not on file   Social History Narrative  . No narrative on file     Review of Systems: General: negative for chills, fever, night sweats or weight changes.  Cardiovascular: negative  for chest pain, dyspnea on exertion, edema, orthopnea, palpitations, paroxysmal nocturnal dyspnea or shortness of breath Dermatological: negative for rash Respiratory: negative for cough or wheezing Urologic: negative for hematuria Abdominal: negative for nausea, vomiting, diarrhea, bright red blood per rectum, melena, or hematemesis Neurologic: negative for visual changes, syncope, or dizziness All other systems reviewed and are otherwise negative except as noted above.    Blood pressure (!) 110/54, pulse 64, height 5\' 9"  (1.753 m), weight 197 lb (89.4 kg), SpO2 90 %.  General appearance: alert, cooperative and no distress Neck: no JVD Lungs: clear to auscultation bilaterally Extremities: 2+ LE edema Skin: Skin color, texture, turgor normal. No rashes or lesions Neurologic: Grossly normal   ASSESSMENT AND  PLAN:   Atrial fibrillation At times with RVR  End stage renal disease ([place]) On HD- Dr Hinda Lenis  Hypotension When on HD  Pacemaker MDT 2008, gen change 2013  Cardiomyopathy [facility]) EF 40-45% by echo June 2014  Coronary atherosclerosis of native coronary artery Mild non obstructive disease 2009, low risk Myoview 2014   PLAN  I suggested Mr Greim take Atenolol 12.5 mg daily. This way he has some beta blocker on board on dialysis days and I can't imagine 12.5 mg of Atenolol would affect his B/P. If he continues to have problems with hypotension and rapid AF on dialysis it could be his dry wgt needs to be adjusted. I considered adding renal dose Lanoxin but with his recent admission with hyperkalemia Lanoxin is not a good choice.   Kerin Ransom PA-C 08/30/2015 3:53 PM

## 2015-08-30 NOTE — Assessment & Plan Note (Signed)
MDT 2008, gen change 2013

## 2015-08-30 NOTE — Assessment & Plan Note (Signed)
Mild non obstructive disease 2009, low risk Myoview 2014

## 2015-08-30 NOTE — Assessment & Plan Note (Signed)
EF 40-45% by echo June 2014

## 2015-08-30 NOTE — Assessment & Plan Note (Signed)
When on HD

## 2015-08-30 NOTE — Assessment & Plan Note (Signed)
On HD- Dr Hinda Lenis

## 2015-08-31 DIAGNOSIS — Z992 Dependence on renal dialysis: Secondary | ICD-10-CM | POA: Diagnosis not present

## 2015-08-31 DIAGNOSIS — D631 Anemia in chronic kidney disease: Secondary | ICD-10-CM | POA: Diagnosis not present

## 2015-08-31 DIAGNOSIS — N186 End stage renal disease: Secondary | ICD-10-CM | POA: Diagnosis not present

## 2015-08-31 DIAGNOSIS — Z23 Encounter for immunization: Secondary | ICD-10-CM | POA: Diagnosis not present

## 2015-08-31 DIAGNOSIS — D509 Iron deficiency anemia, unspecified: Secondary | ICD-10-CM | POA: Diagnosis not present

## 2015-09-03 DIAGNOSIS — D509 Iron deficiency anemia, unspecified: Secondary | ICD-10-CM | POA: Diagnosis not present

## 2015-09-03 DIAGNOSIS — D631 Anemia in chronic kidney disease: Secondary | ICD-10-CM | POA: Diagnosis not present

## 2015-09-03 DIAGNOSIS — N186 End stage renal disease: Secondary | ICD-10-CM | POA: Diagnosis not present

## 2015-09-03 DIAGNOSIS — Z23 Encounter for immunization: Secondary | ICD-10-CM | POA: Diagnosis not present

## 2015-09-03 DIAGNOSIS — Z992 Dependence on renal dialysis: Secondary | ICD-10-CM | POA: Diagnosis not present

## 2015-09-05 DIAGNOSIS — D631 Anemia in chronic kidney disease: Secondary | ICD-10-CM | POA: Diagnosis not present

## 2015-09-05 DIAGNOSIS — Z23 Encounter for immunization: Secondary | ICD-10-CM | POA: Diagnosis not present

## 2015-09-05 DIAGNOSIS — D509 Iron deficiency anemia, unspecified: Secondary | ICD-10-CM | POA: Diagnosis not present

## 2015-09-05 DIAGNOSIS — Z992 Dependence on renal dialysis: Secondary | ICD-10-CM | POA: Diagnosis not present

## 2015-09-05 DIAGNOSIS — N186 End stage renal disease: Secondary | ICD-10-CM | POA: Diagnosis not present

## 2015-09-08 DIAGNOSIS — Z992 Dependence on renal dialysis: Secondary | ICD-10-CM | POA: Diagnosis not present

## 2015-09-08 DIAGNOSIS — N186 End stage renal disease: Secondary | ICD-10-CM | POA: Diagnosis not present

## 2015-09-08 DIAGNOSIS — D631 Anemia in chronic kidney disease: Secondary | ICD-10-CM | POA: Diagnosis not present

## 2015-09-08 DIAGNOSIS — D509 Iron deficiency anemia, unspecified: Secondary | ICD-10-CM | POA: Diagnosis not present

## 2015-09-08 DIAGNOSIS — Z23 Encounter for immunization: Secondary | ICD-10-CM | POA: Diagnosis not present

## 2015-09-09 DIAGNOSIS — M549 Dorsalgia, unspecified: Secondary | ICD-10-CM | POA: Diagnosis not present

## 2015-09-09 DIAGNOSIS — R269 Unspecified abnormalities of gait and mobility: Secondary | ICD-10-CM | POA: Diagnosis not present

## 2015-09-09 DIAGNOSIS — I4891 Unspecified atrial fibrillation: Secondary | ICD-10-CM | POA: Diagnosis not present

## 2015-09-09 DIAGNOSIS — I10 Essential (primary) hypertension: Secondary | ICD-10-CM | POA: Diagnosis not present

## 2015-09-10 DIAGNOSIS — N186 End stage renal disease: Secondary | ICD-10-CM | POA: Diagnosis not present

## 2015-09-10 DIAGNOSIS — D631 Anemia in chronic kidney disease: Secondary | ICD-10-CM | POA: Diagnosis not present

## 2015-09-10 DIAGNOSIS — Z992 Dependence on renal dialysis: Secondary | ICD-10-CM | POA: Diagnosis not present

## 2015-09-10 DIAGNOSIS — Z23 Encounter for immunization: Secondary | ICD-10-CM | POA: Diagnosis not present

## 2015-09-10 DIAGNOSIS — D509 Iron deficiency anemia, unspecified: Secondary | ICD-10-CM | POA: Diagnosis not present

## 2015-09-12 DIAGNOSIS — Z23 Encounter for immunization: Secondary | ICD-10-CM | POA: Diagnosis not present

## 2015-09-12 DIAGNOSIS — D631 Anemia in chronic kidney disease: Secondary | ICD-10-CM | POA: Diagnosis not present

## 2015-09-12 DIAGNOSIS — D509 Iron deficiency anemia, unspecified: Secondary | ICD-10-CM | POA: Diagnosis not present

## 2015-09-12 DIAGNOSIS — Z992 Dependence on renal dialysis: Secondary | ICD-10-CM | POA: Diagnosis not present

## 2015-09-12 DIAGNOSIS — N186 End stage renal disease: Secondary | ICD-10-CM | POA: Diagnosis not present

## 2015-09-14 DIAGNOSIS — Z992 Dependence on renal dialysis: Secondary | ICD-10-CM | POA: Diagnosis not present

## 2015-09-14 DIAGNOSIS — N186 End stage renal disease: Secondary | ICD-10-CM | POA: Diagnosis not present

## 2015-09-14 DIAGNOSIS — Z23 Encounter for immunization: Secondary | ICD-10-CM | POA: Diagnosis not present

## 2015-09-14 DIAGNOSIS — D631 Anemia in chronic kidney disease: Secondary | ICD-10-CM | POA: Diagnosis not present

## 2015-09-14 DIAGNOSIS — D509 Iron deficiency anemia, unspecified: Secondary | ICD-10-CM | POA: Diagnosis not present

## 2015-09-17 DIAGNOSIS — Z992 Dependence on renal dialysis: Secondary | ICD-10-CM | POA: Diagnosis not present

## 2015-09-17 DIAGNOSIS — Z23 Encounter for immunization: Secondary | ICD-10-CM | POA: Diagnosis not present

## 2015-09-17 DIAGNOSIS — N186 End stage renal disease: Secondary | ICD-10-CM | POA: Diagnosis not present

## 2015-09-17 DIAGNOSIS — D509 Iron deficiency anemia, unspecified: Secondary | ICD-10-CM | POA: Diagnosis not present

## 2015-09-17 DIAGNOSIS — D631 Anemia in chronic kidney disease: Secondary | ICD-10-CM | POA: Diagnosis not present

## 2015-09-19 DIAGNOSIS — Z23 Encounter for immunization: Secondary | ICD-10-CM | POA: Diagnosis not present

## 2015-09-19 DIAGNOSIS — N186 End stage renal disease: Secondary | ICD-10-CM | POA: Diagnosis not present

## 2015-09-19 DIAGNOSIS — Z992 Dependence on renal dialysis: Secondary | ICD-10-CM | POA: Diagnosis not present

## 2015-09-19 DIAGNOSIS — D631 Anemia in chronic kidney disease: Secondary | ICD-10-CM | POA: Diagnosis not present

## 2015-09-19 DIAGNOSIS — D509 Iron deficiency anemia, unspecified: Secondary | ICD-10-CM | POA: Diagnosis not present

## 2015-09-21 DIAGNOSIS — D631 Anemia in chronic kidney disease: Secondary | ICD-10-CM | POA: Diagnosis not present

## 2015-09-21 DIAGNOSIS — N186 End stage renal disease: Secondary | ICD-10-CM | POA: Diagnosis not present

## 2015-09-21 DIAGNOSIS — Z23 Encounter for immunization: Secondary | ICD-10-CM | POA: Diagnosis not present

## 2015-09-21 DIAGNOSIS — Z992 Dependence on renal dialysis: Secondary | ICD-10-CM | POA: Diagnosis not present

## 2015-09-21 DIAGNOSIS — D509 Iron deficiency anemia, unspecified: Secondary | ICD-10-CM | POA: Diagnosis not present

## 2015-09-24 DIAGNOSIS — Z992 Dependence on renal dialysis: Secondary | ICD-10-CM | POA: Diagnosis not present

## 2015-09-24 DIAGNOSIS — D509 Iron deficiency anemia, unspecified: Secondary | ICD-10-CM | POA: Diagnosis not present

## 2015-09-24 DIAGNOSIS — N186 End stage renal disease: Secondary | ICD-10-CM | POA: Diagnosis not present

## 2015-09-24 DIAGNOSIS — Z23 Encounter for immunization: Secondary | ICD-10-CM | POA: Diagnosis not present

## 2015-09-24 DIAGNOSIS — D631 Anemia in chronic kidney disease: Secondary | ICD-10-CM | POA: Diagnosis not present

## 2015-09-26 DIAGNOSIS — N186 End stage renal disease: Secondary | ICD-10-CM | POA: Diagnosis not present

## 2015-09-26 DIAGNOSIS — Z23 Encounter for immunization: Secondary | ICD-10-CM | POA: Diagnosis not present

## 2015-09-26 DIAGNOSIS — D509 Iron deficiency anemia, unspecified: Secondary | ICD-10-CM | POA: Diagnosis not present

## 2015-09-26 DIAGNOSIS — D631 Anemia in chronic kidney disease: Secondary | ICD-10-CM | POA: Diagnosis not present

## 2015-09-26 DIAGNOSIS — Z992 Dependence on renal dialysis: Secondary | ICD-10-CM | POA: Diagnosis not present

## 2015-09-28 DIAGNOSIS — D631 Anemia in chronic kidney disease: Secondary | ICD-10-CM | POA: Diagnosis not present

## 2015-09-28 DIAGNOSIS — N186 End stage renal disease: Secondary | ICD-10-CM | POA: Diagnosis not present

## 2015-09-28 DIAGNOSIS — Z23 Encounter for immunization: Secondary | ICD-10-CM | POA: Diagnosis not present

## 2015-09-28 DIAGNOSIS — D509 Iron deficiency anemia, unspecified: Secondary | ICD-10-CM | POA: Diagnosis not present

## 2015-09-28 DIAGNOSIS — Z992 Dependence on renal dialysis: Secondary | ICD-10-CM | POA: Diagnosis not present

## 2015-09-30 DIAGNOSIS — Z23 Encounter for immunization: Secondary | ICD-10-CM | POA: Diagnosis not present

## 2015-09-30 DIAGNOSIS — N186 End stage renal disease: Secondary | ICD-10-CM | POA: Diagnosis not present

## 2015-09-30 DIAGNOSIS — D631 Anemia in chronic kidney disease: Secondary | ICD-10-CM | POA: Diagnosis not present

## 2015-09-30 DIAGNOSIS — Z992 Dependence on renal dialysis: Secondary | ICD-10-CM | POA: Diagnosis not present

## 2015-09-30 DIAGNOSIS — D509 Iron deficiency anemia, unspecified: Secondary | ICD-10-CM | POA: Diagnosis not present

## 2015-10-03 DIAGNOSIS — Z992 Dependence on renal dialysis: Secondary | ICD-10-CM | POA: Diagnosis not present

## 2015-10-03 DIAGNOSIS — D509 Iron deficiency anemia, unspecified: Secondary | ICD-10-CM | POA: Diagnosis not present

## 2015-10-03 DIAGNOSIS — Z23 Encounter for immunization: Secondary | ICD-10-CM | POA: Diagnosis not present

## 2015-10-03 DIAGNOSIS — D631 Anemia in chronic kidney disease: Secondary | ICD-10-CM | POA: Diagnosis not present

## 2015-10-03 DIAGNOSIS — N186 End stage renal disease: Secondary | ICD-10-CM | POA: Diagnosis not present

## 2015-10-05 DIAGNOSIS — N186 End stage renal disease: Secondary | ICD-10-CM | POA: Diagnosis not present

## 2015-10-05 DIAGNOSIS — Z23 Encounter for immunization: Secondary | ICD-10-CM | POA: Diagnosis not present

## 2015-10-05 DIAGNOSIS — D509 Iron deficiency anemia, unspecified: Secondary | ICD-10-CM | POA: Diagnosis not present

## 2015-10-05 DIAGNOSIS — Z992 Dependence on renal dialysis: Secondary | ICD-10-CM | POA: Diagnosis not present

## 2015-10-05 DIAGNOSIS — D631 Anemia in chronic kidney disease: Secondary | ICD-10-CM | POA: Diagnosis not present

## 2015-10-08 DIAGNOSIS — D631 Anemia in chronic kidney disease: Secondary | ICD-10-CM | POA: Diagnosis not present

## 2015-10-08 DIAGNOSIS — Z992 Dependence on renal dialysis: Secondary | ICD-10-CM | POA: Diagnosis not present

## 2015-10-08 DIAGNOSIS — Z23 Encounter for immunization: Secondary | ICD-10-CM | POA: Diagnosis not present

## 2015-10-08 DIAGNOSIS — N186 End stage renal disease: Secondary | ICD-10-CM | POA: Diagnosis not present

## 2015-10-08 DIAGNOSIS — D509 Iron deficiency anemia, unspecified: Secondary | ICD-10-CM | POA: Diagnosis not present

## 2015-10-10 DIAGNOSIS — Z992 Dependence on renal dialysis: Secondary | ICD-10-CM | POA: Diagnosis not present

## 2015-10-10 DIAGNOSIS — D631 Anemia in chronic kidney disease: Secondary | ICD-10-CM | POA: Diagnosis not present

## 2015-10-10 DIAGNOSIS — D509 Iron deficiency anemia, unspecified: Secondary | ICD-10-CM | POA: Diagnosis not present

## 2015-10-10 DIAGNOSIS — N186 End stage renal disease: Secondary | ICD-10-CM | POA: Diagnosis not present

## 2015-10-10 DIAGNOSIS — Z23 Encounter for immunization: Secondary | ICD-10-CM | POA: Diagnosis not present

## 2015-10-11 DIAGNOSIS — Z6830 Body mass index (BMI) 30.0-30.9, adult: Secondary | ICD-10-CM | POA: Diagnosis not present

## 2015-10-11 DIAGNOSIS — R269 Unspecified abnormalities of gait and mobility: Secondary | ICD-10-CM | POA: Diagnosis not present

## 2015-10-11 DIAGNOSIS — N189 Chronic kidney disease, unspecified: Secondary | ICD-10-CM | POA: Diagnosis not present

## 2015-10-11 DIAGNOSIS — I10 Essential (primary) hypertension: Secondary | ICD-10-CM | POA: Diagnosis not present

## 2015-10-12 DIAGNOSIS — Z23 Encounter for immunization: Secondary | ICD-10-CM | POA: Diagnosis not present

## 2015-10-12 DIAGNOSIS — D631 Anemia in chronic kidney disease: Secondary | ICD-10-CM | POA: Diagnosis not present

## 2015-10-12 DIAGNOSIS — Z992 Dependence on renal dialysis: Secondary | ICD-10-CM | POA: Diagnosis not present

## 2015-10-12 DIAGNOSIS — D509 Iron deficiency anemia, unspecified: Secondary | ICD-10-CM | POA: Diagnosis not present

## 2015-10-12 DIAGNOSIS — N186 End stage renal disease: Secondary | ICD-10-CM | POA: Diagnosis not present

## 2015-10-15 DIAGNOSIS — D509 Iron deficiency anemia, unspecified: Secondary | ICD-10-CM | POA: Diagnosis not present

## 2015-10-15 DIAGNOSIS — Z992 Dependence on renal dialysis: Secondary | ICD-10-CM | POA: Diagnosis not present

## 2015-10-15 DIAGNOSIS — N186 End stage renal disease: Secondary | ICD-10-CM | POA: Diagnosis not present

## 2015-10-15 DIAGNOSIS — D631 Anemia in chronic kidney disease: Secondary | ICD-10-CM | POA: Diagnosis not present

## 2015-10-17 DIAGNOSIS — N186 End stage renal disease: Secondary | ICD-10-CM | POA: Diagnosis not present

## 2015-10-17 DIAGNOSIS — D631 Anemia in chronic kidney disease: Secondary | ICD-10-CM | POA: Diagnosis not present

## 2015-10-17 DIAGNOSIS — D509 Iron deficiency anemia, unspecified: Secondary | ICD-10-CM | POA: Diagnosis not present

## 2015-10-19 DIAGNOSIS — Z992 Dependence on renal dialysis: Secondary | ICD-10-CM | POA: Diagnosis not present

## 2015-10-19 DIAGNOSIS — N186 End stage renal disease: Secondary | ICD-10-CM | POA: Diagnosis not present

## 2015-10-19 DIAGNOSIS — D631 Anemia in chronic kidney disease: Secondary | ICD-10-CM | POA: Diagnosis not present

## 2015-10-19 DIAGNOSIS — D509 Iron deficiency anemia, unspecified: Secondary | ICD-10-CM | POA: Diagnosis not present

## 2015-10-22 DIAGNOSIS — Z992 Dependence on renal dialysis: Secondary | ICD-10-CM | POA: Diagnosis not present

## 2015-10-22 DIAGNOSIS — N186 End stage renal disease: Secondary | ICD-10-CM | POA: Diagnosis not present

## 2015-10-22 DIAGNOSIS — D509 Iron deficiency anemia, unspecified: Secondary | ICD-10-CM | POA: Diagnosis not present

## 2015-10-22 DIAGNOSIS — D631 Anemia in chronic kidney disease: Secondary | ICD-10-CM | POA: Diagnosis not present

## 2015-10-22 DIAGNOSIS — Z23 Encounter for immunization: Secondary | ICD-10-CM | POA: Diagnosis not present

## 2015-10-22 DIAGNOSIS — N2581 Secondary hyperparathyroidism of renal origin: Secondary | ICD-10-CM | POA: Diagnosis not present

## 2015-10-24 DIAGNOSIS — D509 Iron deficiency anemia, unspecified: Secondary | ICD-10-CM | POA: Diagnosis not present

## 2015-10-24 DIAGNOSIS — Z992 Dependence on renal dialysis: Secondary | ICD-10-CM | POA: Diagnosis not present

## 2015-10-24 DIAGNOSIS — N186 End stage renal disease: Secondary | ICD-10-CM | POA: Diagnosis not present

## 2015-10-24 DIAGNOSIS — D631 Anemia in chronic kidney disease: Secondary | ICD-10-CM | POA: Diagnosis not present

## 2015-10-24 DIAGNOSIS — Z23 Encounter for immunization: Secondary | ICD-10-CM | POA: Diagnosis not present

## 2015-10-24 DIAGNOSIS — N2581 Secondary hyperparathyroidism of renal origin: Secondary | ICD-10-CM | POA: Diagnosis not present

## 2015-10-25 NOTE — Progress Notes (Signed)
Cardiology Office Note  Date: 10/28/2015   ID: Dale Torres, DOB November 29, 1925, MRN [id number]  PCP: Dale Blitz, MD  Primary Cardiologist: Dale Lesches, MD   Chief Complaint  Patient presents with  . Coronary Artery Disease  . Atrial Fibrillation    History of Present Illness: Dale Torres is an 82 y.o. male last seen in August by [name]. At that time it was recommended that he take atenolol 12.5 mg daily for better control of heart rate, it had been discontinued or at least used intermittently due to hypotension with hemodialysis. He comes in today with his son for a follow-up visit, doing well. He states that he has been able to take the atenolol as directed, and this has not been limiting his hemodialysis sessions. He does not report any chest pain or palpitations.  Patient continues on atenolol for heart rate control of atrial fibrillation, amiodarone was discontinued previously due to persistent arrhythmia, and he has declined anticoagulation. I see that he was started back on amiodarone in the interim, has been taking 200 mg twice daily. I talked to him about cutting it back to once daily for now.  Past Medical History:  Diagnosis Date  . Anemia    Dr. Sonny Dandy  . Arthritis   . Benign prostatic hypertrophy   . Carpal tunnel syndrome of left wrist   . Coronary atherosclerosis of native coronary artery    Nonobstructive 2009  . ESRD on hemodialysis ([place])   . Essential hypertension, benign   . GERD (gastroesophageal reflux disease)   . Headache   . History of pneumonia   . HOH (hard of hearing)    Bilateral  hearing aids  . Hypothyroidism   . Persistent atrial fibrillation (HCC)    Declines coumadin  . Presence of permanent cardiac pacemaker   . Sick sinus syndrome [facility])    Status post pacemaker placement 2008  . Skin cancer, basal cell     Past Surgical History:  Procedure Laterality Date  . APPENDECTOMY    . AV FISTULA PLACEMENT Left 08/16/2014   Procedure: Left Arm creation of Brachiocephalic ARTERIOVENOUS (AV) FISTULA ;  Surgeon: Angelia Mould, MD;  Location: [facility];  Service: Vascular;  Laterality: Left;  . CHOLECYSTECTOMY    . COLONOSCOPY W/ BIOPSIES AND POLYPECTOMY    . EYE SURGERY    . LAMINECTOMY    . PACEMAKER GENERATOR CHANGE  03/03/11   MDT Adaptal L implanted by Dr Rayann Heman  . PACEMAKER PLACEMENT  2008   [employer]tr[employer] - Dr. Doreatha Lew  . PERIPHERAL VASCULAR CATHETERIZATION Left 03/06/2015   Procedure: Fistulagram;  Surgeon: Serafina Mitchell, MD;  Location: [facility] CV LAB;  Service: Cardiovascular;  Laterality: Left;  . PERMANENT PACEMAKER GENERATOR CHANGE N/A 03/03/2011   Procedure: PERMANENT PACEMAKER GENERATOR CHANGE;  Surgeon: Thompson Grayer, MD;  Location: [facility] C[facility] LAB;  Service: Cardiovascular;  Laterality: N/A;  . Right rotator cuff repair    . THROMBECTOMY AND REVISION OF ARTERIOVENTOUS (AV) GORETEX  GRAFT Left 03/11/2015   Procedure: THROMBECTOMY AND REVISION OF ARTERIOVENTOUS (AV) GORETEX  GRAFT LEFT ARM;  Surgeon: Rosetta Posner, MD;  Location: [place];  Service: Vascular;  Laterality: Left;  . TONSILLECTOMY      Current Outpatient Prescriptions  Medication Sig Dispense Refill  . amiodarone (PACERONE) 200 MG tablet TAKE TWO (2) TABLETS BY MOUTH TWICE DAILY.  0  . aspirin 81 MG tablet Take 81 mg by mouth daily.      Marland Kitchen  atenolol (TENORMIN) 25 MG tablet Take 0.5 tablets (12.5 mg total) by mouth daily. 30 tablet 6  . calcitRIOL (ROCALTROL) 0.5 MCG capsule Take 0.5 mcg by mouth daily. Reported on 02/13/2015    . docusate sodium (COLACE) 100 MG capsule Take 100 mg by mouth as needed for constipation.    . fluticasone furoate-vilanterol (BREO ELLIPTA) 200-25 MCG/INH AEPB Inhale 1 puff into the lungs daily.    . folic acid (FOLVITE) 1 MG tablet Take 1 mg by mouth daily.      Marland Kitchen gabapentin (NEURONTIN) 400 MG capsule Take 400 mg by mouth daily.      Marland Kitchen HYDROcodone-acetaminophen (NORCO) 10-325 MG tablet Take 1 tablet by mouth every 6  (six) hours as needed for severe pain. 30 tablet 0  . levothyroxine (SYNTHROID, LEVOTHROID) 112 MCG tablet Take 112 mcg by mouth daily.    . midodrine (PROAMATINE) 10 MG tablet Take 1 tablet by mouth 2 (two) times daily.    . nitroGLYCERIN (NITROSTAT) 0.4 MG SL tablet Place 1 tablet (0.4 mg total) under the tongue every 5 (five) minutes as needed. (Patient taking differently: Place 0.4 mg under the tongue every 5 (five) minutes as needed for chest pain. ) 25 tablet 2  . omeprazole (PRILOSEC) 20 MG capsule Take 20 mg by mouth daily.       No current facility-administered medications for this visit.    Allergies:  Oxycodone hcl   Social History: The patient  reports that he quit smoking about 55 years ago. His smoking use included Cigarettes. He started smoking about 67 years ago. He has a 9.60 pack-year smoking history. He quit smokeless tobacco use about 35 years ago. His smokeless tobacco use included Chew. He reports that he drinks alcohol. He reports that he does not use drugs.   ROS:  Please see the history of present illness. Otherwise, complete review of systems is positive for decreased hearing, uses a rolling walker.  All other systems are reviewed and negative.   Physical Exam: VS:  BP (!) 122/58   Pulse 67   Ht 5\' 9"  (1.753 m)   Wt 192 lb (87.1 kg)   SpO2 98%   BMI 28.35 kg/m , BMI Body mass index is 28.35 kg/m.  Wt Readings from Last 3 Encounters:  10/28/15 192 lb (87.1 kg)  08/30/15 197 lb (89.4 kg)  06/26/15 193 lb (87.5 kg)    General: Elderly male, appears comfortable at rest. Has rolling walker. HEENT: Conjunctiva and lids normal, oropharynx clear. Neck: Supple, no elevated JVP or carotid bruits, no thyromegaly. Lungs: Clear to auscultation, nonlabored breathing at rest. Cardiac: Irregularly irregular, no S3, soft systolic murmur, no pericardial rub. Abdomen: Soft, nontender, bowel sounds present. Extremities: Mild ankle edema, distal pulses 2+. Left arm AV  fistula. Skin: Warm and dry. Musculoskeletal: Mld kyphosis. Neuropsychiatric: Alert and oriented 3, affect appropriate.  ECG: I personally reviewed the tracing from 11/30/2014 [facility]) which reveals atrial fibrillation with intermittent ventricular pacing, lead motion artifact noted.  Recent Labwork: 03/06/2015: BUN 66; Creatinine, Ser 6.30 03/11/2015: Hemoglobin 12.2; Potassium 4.3; Sodium 141   Other Studies Reviewed Today:  Lexiscan Cardiolite June 2014: Low risk with possible inferior scar and minor peri-infarct ischemia, LVEF 43%.   Echocardiogram 06/29/2012: Study Conclusions  - Left ventricle: The cavity size was normal. There was mild concentric hypertrophy. Systolic function was mildly to moderately reduced. The estimated ejection fraction was in the range of 40% to 45%. There is hypokinesis of the basal-midinferior myocardium. There is  hypokinesis of the mid-distalanteroseptal myocardium. Doppler parameters are consistent with abnormal left ventricular relaxation (grade 1 diastolic dysfunction). - Aortic valve: Trileaflet; mildly thickened leaflets. Trivial regurgitation. Mean gradient: 72mm Hg (S). Valve area: 2.67cm^2(VTI). - Mitral valve: Mild regurgitation. - Left atrium: The atrium was moderately dilated. - Right ventricle: Pacer wire or catheter noted in right ventricle. - Right atrium: Central venous pressure: 68mm Hg (est). - Tricuspid valve: Mild regurgitation. - Pulmonic valve: Mild regurgitation. - Pulmonary arteries: PA peak pressure: 83mm Hg (S). - Pericardium, extracardiac: There was no pericardial effusion.  Assessment and Plan:  1. Paroxysmal to persistent atrial fibrillation. He is tolerating low-dose atenolol at this point, and was placed back on amiodarone in the interim. I have asked him to cut amiodarone back to 200 mg once daily. We will see how he does in terms of rhythm and heart rate control. He has declined  anticoagulation.  2. Sick sinus syndrome status post Medtronic pacemaker placement.  3. End-stage renal disease on hemodialysis. He does not report any limiting hypotension at this time.  4. Non-obstructive CAD, no active angina symptoms.  Current medicines were reviewed with the patient today.  Disposition: Follow-up with me in 4 months.  Signed, Satira Sark, MD, [facility] 10/28/2015 10:15 AM    [facility] at [place], [place], [place] [address] Phone: [phone]; Fax: [phone]

## 2015-10-26 DIAGNOSIS — N2581 Secondary hyperparathyroidism of renal origin: Secondary | ICD-10-CM | POA: Diagnosis not present

## 2015-10-26 DIAGNOSIS — D509 Iron deficiency anemia, unspecified: Secondary | ICD-10-CM | POA: Diagnosis not present

## 2015-10-26 DIAGNOSIS — D631 Anemia in chronic kidney disease: Secondary | ICD-10-CM | POA: Diagnosis not present

## 2015-10-26 DIAGNOSIS — Z23 Encounter for immunization: Secondary | ICD-10-CM | POA: Diagnosis not present

## 2015-10-26 DIAGNOSIS — N186 End stage renal disease: Secondary | ICD-10-CM | POA: Diagnosis not present

## 2015-10-26 DIAGNOSIS — Z992 Dependence on renal dialysis: Secondary | ICD-10-CM | POA: Diagnosis not present

## 2015-10-28 ENCOUNTER — Encounter: Payer: Self-pay | Admitting: Cardiology

## 2015-10-28 ENCOUNTER — Ambulatory Visit (INDEPENDENT_AMBULATORY_CARE_PROVIDER_SITE_OTHER): Payer: Medicare Other | Admitting: Cardiology

## 2015-10-28 VITALS — BP 122/58 | HR 67 | Ht 69.0 in | Wt 192.0 lb

## 2015-10-28 DIAGNOSIS — I2510 Atherosclerotic heart disease of native coronary artery without angina pectoris: Secondary | ICD-10-CM

## 2015-10-28 DIAGNOSIS — N186 End stage renal disease: Secondary | ICD-10-CM | POA: Diagnosis not present

## 2015-10-28 DIAGNOSIS — I495 Sick sinus syndrome: Secondary | ICD-10-CM

## 2015-10-28 DIAGNOSIS — I481 Persistent atrial fibrillation: Secondary | ICD-10-CM | POA: Diagnosis not present

## 2015-10-28 DIAGNOSIS — I4819 Other persistent atrial fibrillation: Secondary | ICD-10-CM

## 2015-10-28 NOTE — Patient Instructions (Addendum)
Your physician recommends that you schedule a follow-up appointment in: [place]  Your physician has recommended you make the following change in your medication:   DECREASE AMIODARONE 200 MG ONCE DAILY   Thank you for choosing [facility]!!

## 2015-10-29 ENCOUNTER — Telehealth: Payer: Self-pay | Admitting: Cardiology

## 2015-10-29 ENCOUNTER — Ambulatory Visit (INDEPENDENT_AMBULATORY_CARE_PROVIDER_SITE_OTHER): Payer: Medicare Other | Admitting: *Deleted

## 2015-10-29 DIAGNOSIS — I495 Sick sinus syndrome: Secondary | ICD-10-CM | POA: Diagnosis not present

## 2015-10-29 DIAGNOSIS — Z992 Dependence on renal dialysis: Secondary | ICD-10-CM | POA: Diagnosis not present

## 2015-10-29 DIAGNOSIS — N186 End stage renal disease: Secondary | ICD-10-CM | POA: Diagnosis not present

## 2015-10-29 DIAGNOSIS — Z23 Encounter for immunization: Secondary | ICD-10-CM | POA: Diagnosis not present

## 2015-10-29 DIAGNOSIS — D509 Iron deficiency anemia, unspecified: Secondary | ICD-10-CM | POA: Diagnosis not present

## 2015-10-29 DIAGNOSIS — D631 Anemia in chronic kidney disease: Secondary | ICD-10-CM | POA: Diagnosis not present

## 2015-10-29 DIAGNOSIS — N2581 Secondary hyperparathyroidism of renal origin: Secondary | ICD-10-CM | POA: Diagnosis not present

## 2015-10-29 NOTE — Telephone Encounter (Signed)
LMOVM reminding pt to send remote transmission.   

## 2015-10-30 ENCOUNTER — Encounter: Payer: Self-pay | Admitting: Cardiology

## 2015-10-30 NOTE — Progress Notes (Signed)
Remote pacemaker transmission.   

## 2015-10-31 DIAGNOSIS — D631 Anemia in chronic kidney disease: Secondary | ICD-10-CM | POA: Diagnosis not present

## 2015-10-31 DIAGNOSIS — D509 Iron deficiency anemia, unspecified: Secondary | ICD-10-CM | POA: Diagnosis not present

## 2015-10-31 DIAGNOSIS — Z992 Dependence on renal dialysis: Secondary | ICD-10-CM | POA: Diagnosis not present

## 2015-10-31 DIAGNOSIS — Z23 Encounter for immunization: Secondary | ICD-10-CM | POA: Diagnosis not present

## 2015-10-31 DIAGNOSIS — N186 End stage renal disease: Secondary | ICD-10-CM | POA: Diagnosis not present

## 2015-10-31 DIAGNOSIS — N2581 Secondary hyperparathyroidism of renal origin: Secondary | ICD-10-CM | POA: Diagnosis not present

## 2015-11-02 DIAGNOSIS — N2581 Secondary hyperparathyroidism of renal origin: Secondary | ICD-10-CM | POA: Diagnosis not present

## 2015-11-02 DIAGNOSIS — Z23 Encounter for immunization: Secondary | ICD-10-CM | POA: Diagnosis not present

## 2015-11-02 DIAGNOSIS — D631 Anemia in chronic kidney disease: Secondary | ICD-10-CM | POA: Diagnosis not present

## 2015-11-02 DIAGNOSIS — D509 Iron deficiency anemia, unspecified: Secondary | ICD-10-CM | POA: Diagnosis not present

## 2015-11-02 DIAGNOSIS — N186 End stage renal disease: Secondary | ICD-10-CM | POA: Diagnosis not present

## 2015-11-02 DIAGNOSIS — Z992 Dependence on renal dialysis: Secondary | ICD-10-CM | POA: Diagnosis not present

## 2015-11-05 DIAGNOSIS — N2581 Secondary hyperparathyroidism of renal origin: Secondary | ICD-10-CM | POA: Diagnosis not present

## 2015-11-05 DIAGNOSIS — D631 Anemia in chronic kidney disease: Secondary | ICD-10-CM | POA: Diagnosis not present

## 2015-11-05 DIAGNOSIS — Z23 Encounter for immunization: Secondary | ICD-10-CM | POA: Diagnosis not present

## 2015-11-05 DIAGNOSIS — N186 End stage renal disease: Secondary | ICD-10-CM | POA: Diagnosis not present

## 2015-11-05 DIAGNOSIS — D509 Iron deficiency anemia, unspecified: Secondary | ICD-10-CM | POA: Diagnosis not present

## 2015-11-05 DIAGNOSIS — Z992 Dependence on renal dialysis: Secondary | ICD-10-CM | POA: Diagnosis not present

## 2015-11-07 DIAGNOSIS — N186 End stage renal disease: Secondary | ICD-10-CM | POA: Diagnosis not present

## 2015-11-07 DIAGNOSIS — D509 Iron deficiency anemia, unspecified: Secondary | ICD-10-CM | POA: Diagnosis not present

## 2015-11-07 DIAGNOSIS — Z992 Dependence on renal dialysis: Secondary | ICD-10-CM | POA: Diagnosis not present

## 2015-11-07 DIAGNOSIS — N2581 Secondary hyperparathyroidism of renal origin: Secondary | ICD-10-CM | POA: Diagnosis not present

## 2015-11-07 DIAGNOSIS — D631 Anemia in chronic kidney disease: Secondary | ICD-10-CM | POA: Diagnosis not present

## 2015-11-07 DIAGNOSIS — Z23 Encounter for immunization: Secondary | ICD-10-CM | POA: Diagnosis not present

## 2015-11-09 DIAGNOSIS — N2581 Secondary hyperparathyroidism of renal origin: Secondary | ICD-10-CM | POA: Diagnosis not present

## 2015-11-09 DIAGNOSIS — N186 End stage renal disease: Secondary | ICD-10-CM | POA: Diagnosis not present

## 2015-11-09 DIAGNOSIS — D631 Anemia in chronic kidney disease: Secondary | ICD-10-CM | POA: Diagnosis not present

## 2015-11-09 DIAGNOSIS — Z23 Encounter for immunization: Secondary | ICD-10-CM | POA: Diagnosis not present

## 2015-11-09 DIAGNOSIS — D509 Iron deficiency anemia, unspecified: Secondary | ICD-10-CM | POA: Diagnosis not present

## 2015-11-09 DIAGNOSIS — Z992 Dependence on renal dialysis: Secondary | ICD-10-CM | POA: Diagnosis not present

## 2015-11-11 DIAGNOSIS — Z299 Encounter for prophylactic measures, unspecified: Secondary | ICD-10-CM | POA: Diagnosis not present

## 2015-11-11 DIAGNOSIS — Z6830 Body mass index (BMI) 30.0-30.9, adult: Secondary | ICD-10-CM | POA: Diagnosis not present

## 2015-11-11 DIAGNOSIS — Z713 Dietary counseling and surveillance: Secondary | ICD-10-CM | POA: Diagnosis not present

## 2015-11-11 DIAGNOSIS — M549 Dorsalgia, unspecified: Secondary | ICD-10-CM | POA: Diagnosis not present

## 2015-11-12 DIAGNOSIS — N186 End stage renal disease: Secondary | ICD-10-CM | POA: Diagnosis not present

## 2015-11-12 DIAGNOSIS — I4891 Unspecified atrial fibrillation: Secondary | ICD-10-CM | POA: Diagnosis not present

## 2015-11-12 DIAGNOSIS — Z992 Dependence on renal dialysis: Secondary | ICD-10-CM | POA: Diagnosis not present

## 2015-11-12 DIAGNOSIS — D509 Iron deficiency anemia, unspecified: Secondary | ICD-10-CM | POA: Diagnosis not present

## 2015-11-12 DIAGNOSIS — N2581 Secondary hyperparathyroidism of renal origin: Secondary | ICD-10-CM | POA: Diagnosis not present

## 2015-11-12 DIAGNOSIS — D631 Anemia in chronic kidney disease: Secondary | ICD-10-CM | POA: Diagnosis not present

## 2015-11-12 DIAGNOSIS — M159 Polyosteoarthritis, unspecified: Secondary | ICD-10-CM | POA: Diagnosis not present

## 2015-11-12 DIAGNOSIS — I10 Essential (primary) hypertension: Secondary | ICD-10-CM | POA: Diagnosis not present

## 2015-11-12 DIAGNOSIS — Z23 Encounter for immunization: Secondary | ICD-10-CM | POA: Diagnosis not present

## 2015-11-14 DIAGNOSIS — N2581 Secondary hyperparathyroidism of renal origin: Secondary | ICD-10-CM | POA: Diagnosis not present

## 2015-11-14 DIAGNOSIS — D509 Iron deficiency anemia, unspecified: Secondary | ICD-10-CM | POA: Diagnosis not present

## 2015-11-14 DIAGNOSIS — Z992 Dependence on renal dialysis: Secondary | ICD-10-CM | POA: Diagnosis not present

## 2015-11-14 DIAGNOSIS — N186 End stage renal disease: Secondary | ICD-10-CM | POA: Diagnosis not present

## 2015-11-14 DIAGNOSIS — D631 Anemia in chronic kidney disease: Secondary | ICD-10-CM | POA: Diagnosis not present

## 2015-11-14 DIAGNOSIS — Z23 Encounter for immunization: Secondary | ICD-10-CM | POA: Diagnosis not present

## 2015-11-16 DIAGNOSIS — N2581 Secondary hyperparathyroidism of renal origin: Secondary | ICD-10-CM | POA: Diagnosis not present

## 2015-11-16 DIAGNOSIS — N186 End stage renal disease: Secondary | ICD-10-CM | POA: Diagnosis not present

## 2015-11-16 DIAGNOSIS — D509 Iron deficiency anemia, unspecified: Secondary | ICD-10-CM | POA: Diagnosis not present

## 2015-11-16 DIAGNOSIS — Z992 Dependence on renal dialysis: Secondary | ICD-10-CM | POA: Diagnosis not present

## 2015-11-16 DIAGNOSIS — Z23 Encounter for immunization: Secondary | ICD-10-CM | POA: Diagnosis not present

## 2015-11-16 DIAGNOSIS — D631 Anemia in chronic kidney disease: Secondary | ICD-10-CM | POA: Diagnosis not present

## 2015-11-19 DIAGNOSIS — Z992 Dependence on renal dialysis: Secondary | ICD-10-CM | POA: Diagnosis not present

## 2015-11-19 DIAGNOSIS — D509 Iron deficiency anemia, unspecified: Secondary | ICD-10-CM | POA: Diagnosis not present

## 2015-11-19 DIAGNOSIS — N186 End stage renal disease: Secondary | ICD-10-CM | POA: Diagnosis not present

## 2015-11-19 DIAGNOSIS — D631 Anemia in chronic kidney disease: Secondary | ICD-10-CM | POA: Diagnosis not present

## 2015-11-19 DIAGNOSIS — Z23 Encounter for immunization: Secondary | ICD-10-CM | POA: Diagnosis not present

## 2015-11-19 DIAGNOSIS — N2581 Secondary hyperparathyroidism of renal origin: Secondary | ICD-10-CM | POA: Diagnosis not present

## 2015-11-21 DIAGNOSIS — D509 Iron deficiency anemia, unspecified: Secondary | ICD-10-CM | POA: Diagnosis not present

## 2015-11-21 DIAGNOSIS — D631 Anemia in chronic kidney disease: Secondary | ICD-10-CM | POA: Diagnosis not present

## 2015-11-21 DIAGNOSIS — N2581 Secondary hyperparathyroidism of renal origin: Secondary | ICD-10-CM | POA: Diagnosis not present

## 2015-11-21 DIAGNOSIS — Z992 Dependence on renal dialysis: Secondary | ICD-10-CM | POA: Diagnosis not present

## 2015-11-21 DIAGNOSIS — N186 End stage renal disease: Secondary | ICD-10-CM | POA: Diagnosis not present

## 2015-11-23 DIAGNOSIS — Z992 Dependence on renal dialysis: Secondary | ICD-10-CM | POA: Diagnosis not present

## 2015-11-23 DIAGNOSIS — N2581 Secondary hyperparathyroidism of renal origin: Secondary | ICD-10-CM | POA: Diagnosis not present

## 2015-11-23 DIAGNOSIS — N186 End stage renal disease: Secondary | ICD-10-CM | POA: Diagnosis not present

## 2015-11-23 DIAGNOSIS — D631 Anemia in chronic kidney disease: Secondary | ICD-10-CM | POA: Diagnosis not present

## 2015-11-23 DIAGNOSIS — D509 Iron deficiency anemia, unspecified: Secondary | ICD-10-CM | POA: Diagnosis not present

## 2015-11-26 DIAGNOSIS — N186 End stage renal disease: Secondary | ICD-10-CM | POA: Diagnosis not present

## 2015-11-26 DIAGNOSIS — D509 Iron deficiency anemia, unspecified: Secondary | ICD-10-CM | POA: Diagnosis not present

## 2015-11-26 DIAGNOSIS — Z992 Dependence on renal dialysis: Secondary | ICD-10-CM | POA: Diagnosis not present

## 2015-11-26 DIAGNOSIS — N2581 Secondary hyperparathyroidism of renal origin: Secondary | ICD-10-CM | POA: Diagnosis not present

## 2015-11-26 DIAGNOSIS — D631 Anemia in chronic kidney disease: Secondary | ICD-10-CM | POA: Diagnosis not present

## 2015-11-26 LAB — CUP PACEART REMOTE DEVICE CHECK
Battery Remaining Longevity: 125 mo
Battery Voltage: 2.79 V
Brady Statistic AP VS Percent: 50 %
Date Time Interrogation Session: 20171010211750
Implantable Lead Implant Date: 20080604
Implantable Lead Location: 753859
Implantable Lead Location: 753860
Implantable Pulse Generator Implant Date: 20130212
Lead Channel Pacing Threshold Pulse Width: 0.4 ms
Lead Channel Setting Pacing Amplitude: 2 V
Lead Channel Setting Pacing Pulse Width: 0.4 ms
Lead Channel Setting Sensing Sensitivity: 2.8 mV
MDC IDC LEAD IMPLANT DT: 20080604
MDC IDC LEAD SERIAL: 498205
MDC IDC LEAD SERIAL: 601713
MDC IDC MSMT BATTERY IMPEDANCE: 186 Ohm
MDC IDC MSMT LEADCHNL RA IMPEDANCE VALUE: 423 Ohm
MDC IDC MSMT LEADCHNL RA PACING THRESHOLD AMPLITUDE: 0.75 V
MDC IDC MSMT LEADCHNL RA PACING THRESHOLD PULSEWIDTH: 0.4 ms
MDC IDC MSMT LEADCHNL RV IMPEDANCE VALUE: 422 Ohm
MDC IDC MSMT LEADCHNL RV PACING THRESHOLD AMPLITUDE: 0.5 V
MDC IDC SET LEADCHNL RV PACING AMPLITUDE: 2.5 V
MDC IDC STAT BRADY AP VP PERCENT: 4 %
MDC IDC STAT BRADY AS VP PERCENT: 0 %
MDC IDC STAT BRADY AS VS PERCENT: 45 %

## 2015-11-28 DIAGNOSIS — N2581 Secondary hyperparathyroidism of renal origin: Secondary | ICD-10-CM | POA: Diagnosis not present

## 2015-11-28 DIAGNOSIS — Z992 Dependence on renal dialysis: Secondary | ICD-10-CM | POA: Diagnosis not present

## 2015-11-28 DIAGNOSIS — D509 Iron deficiency anemia, unspecified: Secondary | ICD-10-CM | POA: Diagnosis not present

## 2015-11-28 DIAGNOSIS — D631 Anemia in chronic kidney disease: Secondary | ICD-10-CM | POA: Diagnosis not present

## 2015-11-28 DIAGNOSIS — N186 End stage renal disease: Secondary | ICD-10-CM | POA: Diagnosis not present

## 2015-11-30 DIAGNOSIS — D509 Iron deficiency anemia, unspecified: Secondary | ICD-10-CM | POA: Diagnosis not present

## 2015-11-30 DIAGNOSIS — D631 Anemia in chronic kidney disease: Secondary | ICD-10-CM | POA: Diagnosis not present

## 2015-11-30 DIAGNOSIS — N2581 Secondary hyperparathyroidism of renal origin: Secondary | ICD-10-CM | POA: Diagnosis not present

## 2015-11-30 DIAGNOSIS — N186 End stage renal disease: Secondary | ICD-10-CM | POA: Diagnosis not present

## 2015-11-30 DIAGNOSIS — Z992 Dependence on renal dialysis: Secondary | ICD-10-CM | POA: Diagnosis not present

## 2015-12-03 DIAGNOSIS — D631 Anemia in chronic kidney disease: Secondary | ICD-10-CM | POA: Diagnosis not present

## 2015-12-03 DIAGNOSIS — D509 Iron deficiency anemia, unspecified: Secondary | ICD-10-CM | POA: Diagnosis not present

## 2015-12-03 DIAGNOSIS — Z992 Dependence on renal dialysis: Secondary | ICD-10-CM | POA: Diagnosis not present

## 2015-12-03 DIAGNOSIS — N186 End stage renal disease: Secondary | ICD-10-CM | POA: Diagnosis not present

## 2015-12-03 DIAGNOSIS — N2581 Secondary hyperparathyroidism of renal origin: Secondary | ICD-10-CM | POA: Diagnosis not present

## 2015-12-05 DIAGNOSIS — D631 Anemia in chronic kidney disease: Secondary | ICD-10-CM | POA: Diagnosis not present

## 2015-12-05 DIAGNOSIS — Z992 Dependence on renal dialysis: Secondary | ICD-10-CM | POA: Diagnosis not present

## 2015-12-05 DIAGNOSIS — N186 End stage renal disease: Secondary | ICD-10-CM | POA: Diagnosis not present

## 2015-12-05 DIAGNOSIS — D509 Iron deficiency anemia, unspecified: Secondary | ICD-10-CM | POA: Diagnosis not present

## 2015-12-05 DIAGNOSIS — N2581 Secondary hyperparathyroidism of renal origin: Secondary | ICD-10-CM | POA: Diagnosis not present

## 2015-12-07 DIAGNOSIS — N2581 Secondary hyperparathyroidism of renal origin: Secondary | ICD-10-CM | POA: Diagnosis not present

## 2015-12-07 DIAGNOSIS — N186 End stage renal disease: Secondary | ICD-10-CM | POA: Diagnosis not present

## 2015-12-07 DIAGNOSIS — D509 Iron deficiency anemia, unspecified: Secondary | ICD-10-CM | POA: Diagnosis not present

## 2015-12-07 DIAGNOSIS — D631 Anemia in chronic kidney disease: Secondary | ICD-10-CM | POA: Diagnosis not present

## 2015-12-07 DIAGNOSIS — Z992 Dependence on renal dialysis: Secondary | ICD-10-CM | POA: Diagnosis not present

## 2015-12-10 DIAGNOSIS — D509 Iron deficiency anemia, unspecified: Secondary | ICD-10-CM | POA: Diagnosis not present

## 2015-12-10 DIAGNOSIS — Z992 Dependence on renal dialysis: Secondary | ICD-10-CM | POA: Diagnosis not present

## 2015-12-10 DIAGNOSIS — D631 Anemia in chronic kidney disease: Secondary | ICD-10-CM | POA: Diagnosis not present

## 2015-12-10 DIAGNOSIS — N186 End stage renal disease: Secondary | ICD-10-CM | POA: Diagnosis not present

## 2015-12-10 DIAGNOSIS — N2581 Secondary hyperparathyroidism of renal origin: Secondary | ICD-10-CM | POA: Diagnosis not present

## 2015-12-11 DIAGNOSIS — M159 Polyosteoarthritis, unspecified: Secondary | ICD-10-CM | POA: Diagnosis not present

## 2015-12-11 DIAGNOSIS — I10 Essential (primary) hypertension: Secondary | ICD-10-CM | POA: Diagnosis not present

## 2015-12-11 DIAGNOSIS — I4891 Unspecified atrial fibrillation: Secondary | ICD-10-CM | POA: Diagnosis not present

## 2015-12-12 DIAGNOSIS — Z992 Dependence on renal dialysis: Secondary | ICD-10-CM | POA: Diagnosis not present

## 2015-12-12 DIAGNOSIS — N186 End stage renal disease: Secondary | ICD-10-CM | POA: Diagnosis not present

## 2015-12-12 DIAGNOSIS — D509 Iron deficiency anemia, unspecified: Secondary | ICD-10-CM | POA: Diagnosis not present

## 2015-12-12 DIAGNOSIS — N2581 Secondary hyperparathyroidism of renal origin: Secondary | ICD-10-CM | POA: Diagnosis not present

## 2015-12-12 DIAGNOSIS — D631 Anemia in chronic kidney disease: Secondary | ICD-10-CM | POA: Diagnosis not present

## 2015-12-14 DIAGNOSIS — Z992 Dependence on renal dialysis: Secondary | ICD-10-CM | POA: Diagnosis not present

## 2015-12-14 DIAGNOSIS — D631 Anemia in chronic kidney disease: Secondary | ICD-10-CM | POA: Diagnosis not present

## 2015-12-14 DIAGNOSIS — N2581 Secondary hyperparathyroidism of renal origin: Secondary | ICD-10-CM | POA: Diagnosis not present

## 2015-12-14 DIAGNOSIS — D509 Iron deficiency anemia, unspecified: Secondary | ICD-10-CM | POA: Diagnosis not present

## 2015-12-14 DIAGNOSIS — N186 End stage renal disease: Secondary | ICD-10-CM | POA: Diagnosis not present

## 2015-12-17 DIAGNOSIS — N186 End stage renal disease: Secondary | ICD-10-CM | POA: Diagnosis not present

## 2015-12-17 DIAGNOSIS — D631 Anemia in chronic kidney disease: Secondary | ICD-10-CM | POA: Diagnosis not present

## 2015-12-17 DIAGNOSIS — N2581 Secondary hyperparathyroidism of renal origin: Secondary | ICD-10-CM | POA: Diagnosis not present

## 2015-12-17 DIAGNOSIS — D509 Iron deficiency anemia, unspecified: Secondary | ICD-10-CM | POA: Diagnosis not present

## 2015-12-17 DIAGNOSIS — Z992 Dependence on renal dialysis: Secondary | ICD-10-CM | POA: Diagnosis not present

## 2015-12-19 DIAGNOSIS — D509 Iron deficiency anemia, unspecified: Secondary | ICD-10-CM | POA: Diagnosis not present

## 2015-12-19 DIAGNOSIS — N186 End stage renal disease: Secondary | ICD-10-CM | POA: Diagnosis not present

## 2015-12-19 DIAGNOSIS — Z992 Dependence on renal dialysis: Secondary | ICD-10-CM | POA: Diagnosis not present

## 2015-12-19 DIAGNOSIS — N2581 Secondary hyperparathyroidism of renal origin: Secondary | ICD-10-CM | POA: Diagnosis not present

## 2015-12-19 DIAGNOSIS — D631 Anemia in chronic kidney disease: Secondary | ICD-10-CM | POA: Diagnosis not present

## 2015-12-21 DIAGNOSIS — N186 End stage renal disease: Secondary | ICD-10-CM | POA: Diagnosis not present

## 2015-12-21 DIAGNOSIS — N2581 Secondary hyperparathyroidism of renal origin: Secondary | ICD-10-CM | POA: Diagnosis not present

## 2015-12-21 DIAGNOSIS — D631 Anemia in chronic kidney disease: Secondary | ICD-10-CM | POA: Diagnosis not present

## 2015-12-21 DIAGNOSIS — Z992 Dependence on renal dialysis: Secondary | ICD-10-CM | POA: Diagnosis not present

## 2015-12-21 DIAGNOSIS — D509 Iron deficiency anemia, unspecified: Secondary | ICD-10-CM | POA: Diagnosis not present

## 2015-12-24 DIAGNOSIS — D509 Iron deficiency anemia, unspecified: Secondary | ICD-10-CM | POA: Diagnosis not present

## 2015-12-24 DIAGNOSIS — D631 Anemia in chronic kidney disease: Secondary | ICD-10-CM | POA: Diagnosis not present

## 2015-12-24 DIAGNOSIS — N2581 Secondary hyperparathyroidism of renal origin: Secondary | ICD-10-CM | POA: Diagnosis not present

## 2015-12-24 DIAGNOSIS — Z992 Dependence on renal dialysis: Secondary | ICD-10-CM | POA: Diagnosis not present

## 2015-12-24 DIAGNOSIS — N186 End stage renal disease: Secondary | ICD-10-CM | POA: Diagnosis not present

## 2015-12-26 DIAGNOSIS — D509 Iron deficiency anemia, unspecified: Secondary | ICD-10-CM | POA: Diagnosis not present

## 2015-12-26 DIAGNOSIS — N2581 Secondary hyperparathyroidism of renal origin: Secondary | ICD-10-CM | POA: Diagnosis not present

## 2015-12-26 DIAGNOSIS — Z992 Dependence on renal dialysis: Secondary | ICD-10-CM | POA: Diagnosis not present

## 2015-12-26 DIAGNOSIS — D631 Anemia in chronic kidney disease: Secondary | ICD-10-CM | POA: Diagnosis not present

## 2015-12-26 DIAGNOSIS — N186 End stage renal disease: Secondary | ICD-10-CM | POA: Diagnosis not present

## 2015-12-28 DIAGNOSIS — D509 Iron deficiency anemia, unspecified: Secondary | ICD-10-CM | POA: Diagnosis not present

## 2015-12-28 DIAGNOSIS — N186 End stage renal disease: Secondary | ICD-10-CM | POA: Diagnosis not present

## 2015-12-28 DIAGNOSIS — D631 Anemia in chronic kidney disease: Secondary | ICD-10-CM | POA: Diagnosis not present

## 2015-12-28 DIAGNOSIS — Z992 Dependence on renal dialysis: Secondary | ICD-10-CM | POA: Diagnosis not present

## 2015-12-28 DIAGNOSIS — N2581 Secondary hyperparathyroidism of renal origin: Secondary | ICD-10-CM | POA: Diagnosis not present

## 2015-12-30 DIAGNOSIS — M549 Dorsalgia, unspecified: Secondary | ICD-10-CM | POA: Diagnosis not present

## 2015-12-30 DIAGNOSIS — Z299 Encounter for prophylactic measures, unspecified: Secondary | ICD-10-CM | POA: Diagnosis not present

## 2015-12-31 DIAGNOSIS — D509 Iron deficiency anemia, unspecified: Secondary | ICD-10-CM | POA: Diagnosis not present

## 2015-12-31 DIAGNOSIS — D631 Anemia in chronic kidney disease: Secondary | ICD-10-CM | POA: Diagnosis not present

## 2015-12-31 DIAGNOSIS — Z992 Dependence on renal dialysis: Secondary | ICD-10-CM | POA: Diagnosis not present

## 2015-12-31 DIAGNOSIS — N186 End stage renal disease: Secondary | ICD-10-CM | POA: Diagnosis not present

## 2015-12-31 DIAGNOSIS — N2581 Secondary hyperparathyroidism of renal origin: Secondary | ICD-10-CM | POA: Diagnosis not present

## 2016-01-02 DIAGNOSIS — N186 End stage renal disease: Secondary | ICD-10-CM | POA: Diagnosis not present

## 2016-01-02 DIAGNOSIS — N2581 Secondary hyperparathyroidism of renal origin: Secondary | ICD-10-CM | POA: Diagnosis not present

## 2016-01-02 DIAGNOSIS — D631 Anemia in chronic kidney disease: Secondary | ICD-10-CM | POA: Diagnosis not present

## 2016-01-02 DIAGNOSIS — Z992 Dependence on renal dialysis: Secondary | ICD-10-CM | POA: Diagnosis not present

## 2016-01-02 DIAGNOSIS — D509 Iron deficiency anemia, unspecified: Secondary | ICD-10-CM | POA: Diagnosis not present

## 2016-01-03 DIAGNOSIS — M159 Polyosteoarthritis, unspecified: Secondary | ICD-10-CM | POA: Diagnosis not present

## 2016-01-03 DIAGNOSIS — I10 Essential (primary) hypertension: Secondary | ICD-10-CM | POA: Diagnosis not present

## 2016-01-03 DIAGNOSIS — I4891 Unspecified atrial fibrillation: Secondary | ICD-10-CM | POA: Diagnosis not present

## 2016-01-04 DIAGNOSIS — N186 End stage renal disease: Secondary | ICD-10-CM | POA: Diagnosis not present

## 2016-01-04 DIAGNOSIS — N2581 Secondary hyperparathyroidism of renal origin: Secondary | ICD-10-CM | POA: Diagnosis not present

## 2016-01-04 DIAGNOSIS — D509 Iron deficiency anemia, unspecified: Secondary | ICD-10-CM | POA: Diagnosis not present

## 2016-01-04 DIAGNOSIS — Z992 Dependence on renal dialysis: Secondary | ICD-10-CM | POA: Diagnosis not present

## 2016-01-04 DIAGNOSIS — D631 Anemia in chronic kidney disease: Secondary | ICD-10-CM | POA: Diagnosis not present

## 2016-01-07 DIAGNOSIS — D631 Anemia in chronic kidney disease: Secondary | ICD-10-CM | POA: Diagnosis not present

## 2016-01-07 DIAGNOSIS — N186 End stage renal disease: Secondary | ICD-10-CM | POA: Diagnosis not present

## 2016-01-07 DIAGNOSIS — Z992 Dependence on renal dialysis: Secondary | ICD-10-CM | POA: Diagnosis not present

## 2016-01-07 DIAGNOSIS — N2581 Secondary hyperparathyroidism of renal origin: Secondary | ICD-10-CM | POA: Diagnosis not present

## 2016-01-07 DIAGNOSIS — D509 Iron deficiency anemia, unspecified: Secondary | ICD-10-CM | POA: Diagnosis not present

## 2016-01-09 DIAGNOSIS — D509 Iron deficiency anemia, unspecified: Secondary | ICD-10-CM | POA: Diagnosis not present

## 2016-01-09 DIAGNOSIS — D631 Anemia in chronic kidney disease: Secondary | ICD-10-CM | POA: Diagnosis not present

## 2016-01-09 DIAGNOSIS — Z992 Dependence on renal dialysis: Secondary | ICD-10-CM | POA: Diagnosis not present

## 2016-01-09 DIAGNOSIS — N186 End stage renal disease: Secondary | ICD-10-CM | POA: Diagnosis not present

## 2016-01-09 DIAGNOSIS — N2581 Secondary hyperparathyroidism of renal origin: Secondary | ICD-10-CM | POA: Diagnosis not present

## 2016-01-11 DIAGNOSIS — D631 Anemia in chronic kidney disease: Secondary | ICD-10-CM | POA: Diagnosis not present

## 2016-01-11 DIAGNOSIS — N2581 Secondary hyperparathyroidism of renal origin: Secondary | ICD-10-CM | POA: Diagnosis not present

## 2016-01-11 DIAGNOSIS — D509 Iron deficiency anemia, unspecified: Secondary | ICD-10-CM | POA: Diagnosis not present

## 2016-01-11 DIAGNOSIS — N186 End stage renal disease: Secondary | ICD-10-CM | POA: Diagnosis not present

## 2016-01-11 DIAGNOSIS — Z992 Dependence on renal dialysis: Secondary | ICD-10-CM | POA: Diagnosis not present

## 2016-01-14 DIAGNOSIS — Z992 Dependence on renal dialysis: Secondary | ICD-10-CM | POA: Diagnosis not present

## 2016-01-14 DIAGNOSIS — D509 Iron deficiency anemia, unspecified: Secondary | ICD-10-CM | POA: Diagnosis not present

## 2016-01-14 DIAGNOSIS — N2581 Secondary hyperparathyroidism of renal origin: Secondary | ICD-10-CM | POA: Diagnosis not present

## 2016-01-14 DIAGNOSIS — N186 End stage renal disease: Secondary | ICD-10-CM | POA: Diagnosis not present

## 2016-01-14 DIAGNOSIS — D631 Anemia in chronic kidney disease: Secondary | ICD-10-CM | POA: Diagnosis not present

## 2016-01-16 DIAGNOSIS — Z992 Dependence on renal dialysis: Secondary | ICD-10-CM | POA: Diagnosis not present

## 2016-01-16 DIAGNOSIS — D631 Anemia in chronic kidney disease: Secondary | ICD-10-CM | POA: Diagnosis not present

## 2016-01-16 DIAGNOSIS — N186 End stage renal disease: Secondary | ICD-10-CM | POA: Diagnosis not present

## 2016-01-16 DIAGNOSIS — D509 Iron deficiency anemia, unspecified: Secondary | ICD-10-CM | POA: Diagnosis not present

## 2016-01-16 DIAGNOSIS — N2581 Secondary hyperparathyroidism of renal origin: Secondary | ICD-10-CM | POA: Diagnosis not present

## 2016-01-18 DIAGNOSIS — N2581 Secondary hyperparathyroidism of renal origin: Secondary | ICD-10-CM | POA: Diagnosis not present

## 2016-01-18 DIAGNOSIS — D631 Anemia in chronic kidney disease: Secondary | ICD-10-CM | POA: Diagnosis not present

## 2016-01-18 DIAGNOSIS — Z992 Dependence on renal dialysis: Secondary | ICD-10-CM | POA: Diagnosis not present

## 2016-01-18 DIAGNOSIS — D509 Iron deficiency anemia, unspecified: Secondary | ICD-10-CM | POA: Diagnosis not present

## 2016-01-18 DIAGNOSIS — N186 End stage renal disease: Secondary | ICD-10-CM | POA: Diagnosis not present

## 2016-01-19 DIAGNOSIS — N186 End stage renal disease: Secondary | ICD-10-CM | POA: Diagnosis not present

## 2016-01-19 DIAGNOSIS — Z992 Dependence on renal dialysis: Secondary | ICD-10-CM | POA: Diagnosis not present

## 2016-01-21 DIAGNOSIS — Z992 Dependence on renal dialysis: Secondary | ICD-10-CM | POA: Diagnosis not present

## 2016-01-21 DIAGNOSIS — N186 End stage renal disease: Secondary | ICD-10-CM | POA: Diagnosis not present

## 2016-01-21 DIAGNOSIS — D509 Iron deficiency anemia, unspecified: Secondary | ICD-10-CM | POA: Diagnosis not present

## 2016-01-21 DIAGNOSIS — D631 Anemia in chronic kidney disease: Secondary | ICD-10-CM | POA: Diagnosis not present

## 2016-01-23 DIAGNOSIS — D509 Iron deficiency anemia, unspecified: Secondary | ICD-10-CM | POA: Diagnosis not present

## 2016-01-23 DIAGNOSIS — D631 Anemia in chronic kidney disease: Secondary | ICD-10-CM | POA: Diagnosis not present

## 2016-01-23 DIAGNOSIS — Z992 Dependence on renal dialysis: Secondary | ICD-10-CM | POA: Diagnosis not present

## 2016-01-23 DIAGNOSIS — N186 End stage renal disease: Secondary | ICD-10-CM | POA: Diagnosis not present

## 2016-01-25 DIAGNOSIS — D509 Iron deficiency anemia, unspecified: Secondary | ICD-10-CM | POA: Diagnosis not present

## 2016-01-25 DIAGNOSIS — D631 Anemia in chronic kidney disease: Secondary | ICD-10-CM | POA: Diagnosis not present

## 2016-01-25 DIAGNOSIS — Z992 Dependence on renal dialysis: Secondary | ICD-10-CM | POA: Diagnosis not present

## 2016-01-25 DIAGNOSIS — N186 End stage renal disease: Secondary | ICD-10-CM | POA: Diagnosis not present

## 2016-01-28 ENCOUNTER — Telehealth: Payer: Self-pay | Admitting: Cardiology

## 2016-01-28 ENCOUNTER — Ambulatory Visit (INDEPENDENT_AMBULATORY_CARE_PROVIDER_SITE_OTHER): Payer: Medicare Other | Admitting: *Deleted

## 2016-01-28 DIAGNOSIS — I495 Sick sinus syndrome: Secondary | ICD-10-CM | POA: Diagnosis not present

## 2016-01-28 DIAGNOSIS — D631 Anemia in chronic kidney disease: Secondary | ICD-10-CM | POA: Diagnosis not present

## 2016-01-28 DIAGNOSIS — N186 End stage renal disease: Secondary | ICD-10-CM | POA: Diagnosis not present

## 2016-01-28 DIAGNOSIS — D509 Iron deficiency anemia, unspecified: Secondary | ICD-10-CM | POA: Diagnosis not present

## 2016-01-28 DIAGNOSIS — Z992 Dependence on renal dialysis: Secondary | ICD-10-CM | POA: Diagnosis not present

## 2016-01-28 NOTE — Telephone Encounter (Signed)
Spoke with pt and reminded pt of remote transmission that is due today. Pt verbalized understanding.   

## 2016-01-29 ENCOUNTER — Encounter: Payer: Self-pay | Admitting: Cardiology

## 2016-01-29 NOTE — Progress Notes (Signed)
Remote pacemaker transmission.   

## 2016-01-30 DIAGNOSIS — D631 Anemia in chronic kidney disease: Secondary | ICD-10-CM | POA: Diagnosis not present

## 2016-01-30 DIAGNOSIS — Z992 Dependence on renal dialysis: Secondary | ICD-10-CM | POA: Diagnosis not present

## 2016-01-30 DIAGNOSIS — N186 End stage renal disease: Secondary | ICD-10-CM | POA: Diagnosis not present

## 2016-01-30 DIAGNOSIS — D509 Iron deficiency anemia, unspecified: Secondary | ICD-10-CM | POA: Diagnosis not present

## 2016-01-30 DIAGNOSIS — I4891 Unspecified atrial fibrillation: Secondary | ICD-10-CM | POA: Diagnosis not present

## 2016-01-30 DIAGNOSIS — M159 Polyosteoarthritis, unspecified: Secondary | ICD-10-CM | POA: Diagnosis not present

## 2016-01-30 DIAGNOSIS — I10 Essential (primary) hypertension: Secondary | ICD-10-CM | POA: Diagnosis not present

## 2016-01-30 LAB — CUP PACEART REMOTE DEVICE CHECK
Battery Voltage: 2.79 V
Brady Statistic AP VP Percent: 3 %
Brady Statistic AP VS Percent: 71 %
Brady Statistic AS VP Percent: 0 %
Brady Statistic AS VS Percent: 26 %
Implantable Lead Implant Date: 20080604
Implantable Lead Location: 753859
Implantable Lead Location: 753860
Implantable Lead Model: 4470
Implantable Lead Serial Number: 498205
Implantable Pulse Generator Implant Date: 20130212
Lead Channel Impedance Value: 411 Ohm
Lead Channel Impedance Value: 422 Ohm
Lead Channel Pacing Threshold Amplitude: 0.5 V
Lead Channel Pacing Threshold Amplitude: 0.625 V
Lead Channel Pacing Threshold Pulse Width: 0.4 ms
Lead Channel Setting Sensing Sensitivity: 2.8 mV
MDC IDC LEAD IMPLANT DT: 20080604
MDC IDC LEAD SERIAL: 601713
MDC IDC MSMT BATTERY IMPEDANCE: 186 Ohm
MDC IDC MSMT BATTERY REMAINING LONGEVITY: 124 mo
MDC IDC MSMT LEADCHNL RA PACING THRESHOLD PULSEWIDTH: 0.4 ms
MDC IDC SESS DTM: 20180109223018
MDC IDC SET LEADCHNL RA PACING AMPLITUDE: 2 V
MDC IDC SET LEADCHNL RV PACING AMPLITUDE: 2.5 V
MDC IDC SET LEADCHNL RV PACING PULSEWIDTH: 0.4 ms

## 2016-02-01 DIAGNOSIS — D631 Anemia in chronic kidney disease: Secondary | ICD-10-CM | POA: Diagnosis not present

## 2016-02-01 DIAGNOSIS — Z992 Dependence on renal dialysis: Secondary | ICD-10-CM | POA: Diagnosis not present

## 2016-02-01 DIAGNOSIS — N186 End stage renal disease: Secondary | ICD-10-CM | POA: Diagnosis not present

## 2016-02-01 DIAGNOSIS — D509 Iron deficiency anemia, unspecified: Secondary | ICD-10-CM | POA: Diagnosis not present

## 2016-02-03 ENCOUNTER — Other Ambulatory Visit: Payer: Self-pay | Admitting: Cardiology

## 2016-02-03 NOTE — Telephone Encounter (Signed)
Refill:   amiodarone (PACERONE) 200 MG  Patient states that he is taking 400 mg now. Needs RX called to [facility]

## 2016-02-03 NOTE — Telephone Encounter (Signed)
Called daughter to clarify dose of amiodarone. Patient's daughter informed that at last office visit, dose of amiodarone was decreased to 200 mg daily. Daughter advised to refer back to his last office visit instruction sheet for details. Daughter advised nurse that she was looking at an old hospital d/c instruction sheet. Daughter informed that the dosage for amiodarone is 200 mg daily. Daughter verbalized understanding of regimen.

## 2016-02-04 DIAGNOSIS — D631 Anemia in chronic kidney disease: Secondary | ICD-10-CM | POA: Diagnosis not present

## 2016-02-04 DIAGNOSIS — N186 End stage renal disease: Secondary | ICD-10-CM | POA: Diagnosis not present

## 2016-02-04 DIAGNOSIS — D509 Iron deficiency anemia, unspecified: Secondary | ICD-10-CM | POA: Diagnosis not present

## 2016-02-04 DIAGNOSIS — Z992 Dependence on renal dialysis: Secondary | ICD-10-CM | POA: Diagnosis not present

## 2016-02-08 DIAGNOSIS — D509 Iron deficiency anemia, unspecified: Secondary | ICD-10-CM | POA: Diagnosis not present

## 2016-02-08 DIAGNOSIS — N186 End stage renal disease: Secondary | ICD-10-CM | POA: Diagnosis not present

## 2016-02-08 DIAGNOSIS — Z992 Dependence on renal dialysis: Secondary | ICD-10-CM | POA: Diagnosis not present

## 2016-02-08 DIAGNOSIS — D631 Anemia in chronic kidney disease: Secondary | ICD-10-CM | POA: Diagnosis not present

## 2016-02-11 DIAGNOSIS — D509 Iron deficiency anemia, unspecified: Secondary | ICD-10-CM | POA: Diagnosis not present

## 2016-02-11 DIAGNOSIS — N186 End stage renal disease: Secondary | ICD-10-CM | POA: Diagnosis not present

## 2016-02-11 DIAGNOSIS — Z992 Dependence on renal dialysis: Secondary | ICD-10-CM | POA: Diagnosis not present

## 2016-02-11 DIAGNOSIS — D631 Anemia in chronic kidney disease: Secondary | ICD-10-CM | POA: Diagnosis not present

## 2016-02-13 DIAGNOSIS — D631 Anemia in chronic kidney disease: Secondary | ICD-10-CM | POA: Diagnosis not present

## 2016-02-13 DIAGNOSIS — N186 End stage renal disease: Secondary | ICD-10-CM | POA: Diagnosis not present

## 2016-02-13 DIAGNOSIS — D509 Iron deficiency anemia, unspecified: Secondary | ICD-10-CM | POA: Diagnosis not present

## 2016-02-13 DIAGNOSIS — Z992 Dependence on renal dialysis: Secondary | ICD-10-CM | POA: Diagnosis not present

## 2016-02-15 DIAGNOSIS — Z992 Dependence on renal dialysis: Secondary | ICD-10-CM | POA: Diagnosis not present

## 2016-02-15 DIAGNOSIS — D631 Anemia in chronic kidney disease: Secondary | ICD-10-CM | POA: Diagnosis not present

## 2016-02-15 DIAGNOSIS — D509 Iron deficiency anemia, unspecified: Secondary | ICD-10-CM | POA: Diagnosis not present

## 2016-02-15 DIAGNOSIS — N186 End stage renal disease: Secondary | ICD-10-CM | POA: Diagnosis not present

## 2016-02-18 DIAGNOSIS — N186 End stage renal disease: Secondary | ICD-10-CM | POA: Diagnosis not present

## 2016-02-18 DIAGNOSIS — Z992 Dependence on renal dialysis: Secondary | ICD-10-CM | POA: Diagnosis not present

## 2016-02-18 DIAGNOSIS — D631 Anemia in chronic kidney disease: Secondary | ICD-10-CM | POA: Diagnosis not present

## 2016-02-18 DIAGNOSIS — D509 Iron deficiency anemia, unspecified: Secondary | ICD-10-CM | POA: Diagnosis not present

## 2016-02-19 DIAGNOSIS — Z992 Dependence on renal dialysis: Secondary | ICD-10-CM | POA: Diagnosis not present

## 2016-02-19 DIAGNOSIS — N186 End stage renal disease: Secondary | ICD-10-CM | POA: Diagnosis not present

## 2016-02-20 DIAGNOSIS — D631 Anemia in chronic kidney disease: Secondary | ICD-10-CM | POA: Diagnosis not present

## 2016-02-20 DIAGNOSIS — D509 Iron deficiency anemia, unspecified: Secondary | ICD-10-CM | POA: Diagnosis not present

## 2016-02-20 DIAGNOSIS — N2581 Secondary hyperparathyroidism of renal origin: Secondary | ICD-10-CM | POA: Diagnosis not present

## 2016-02-20 DIAGNOSIS — Z23 Encounter for immunization: Secondary | ICD-10-CM | POA: Diagnosis not present

## 2016-02-20 DIAGNOSIS — Z992 Dependence on renal dialysis: Secondary | ICD-10-CM | POA: Diagnosis not present

## 2016-02-20 DIAGNOSIS — N186 End stage renal disease: Secondary | ICD-10-CM | POA: Diagnosis not present

## 2016-02-22 DIAGNOSIS — D631 Anemia in chronic kidney disease: Secondary | ICD-10-CM | POA: Diagnosis not present

## 2016-02-22 DIAGNOSIS — N2581 Secondary hyperparathyroidism of renal origin: Secondary | ICD-10-CM | POA: Diagnosis not present

## 2016-02-22 DIAGNOSIS — D509 Iron deficiency anemia, unspecified: Secondary | ICD-10-CM | POA: Diagnosis not present

## 2016-02-22 DIAGNOSIS — Z992 Dependence on renal dialysis: Secondary | ICD-10-CM | POA: Diagnosis not present

## 2016-02-22 DIAGNOSIS — N186 End stage renal disease: Secondary | ICD-10-CM | POA: Diagnosis not present

## 2016-02-22 DIAGNOSIS — Z23 Encounter for immunization: Secondary | ICD-10-CM | POA: Diagnosis not present

## 2016-02-24 NOTE — Progress Notes (Signed)
Cardiology Office Note  Date: 02/26/2016   ID: Hung, Rhinesmith 07/31/1925, MRN [id number]  PCP: Monico Blitz, MD  Primary Cardiologist: Rozann Lesches, MD   Chief Complaint  Patient presents with  . Atrial Fibrillation    History of Present Illness: Dale Torres is an 83 y.o. male last seen in October 2017. He is here today with his son for a follow-up visit. Reports no major change in status since last encounter. He is not experiencing any chest pain or palpitations. Continues to dialyze 3 days a week and follows with Dr. Lowanda Foster. It sounds like there have been some discussions about converting him to peritoneal dialysis potentially.  He continues to follow in the device clinic with Dr. Rayann Heman, Medtronic pacemaker in place. Most recent device interrogation showed 44% mode switches. I reviewed his medications which are outlined below. Current cardiac regimen includes aspirin, amiodarone, and atenolol. He has declined anticoagulation.  I reviewed his ECG today which shows an atrial paced rhythm with left bundle branch block.  Past Medical History:  Diagnosis Date  . Anemia    Dr. Sonny Dandy  . Arthritis   . Benign prostatic hypertrophy   . Carpal tunnel syndrome of left wrist   . Coronary atherosclerosis of native coronary artery    Nonobstructive 2009  . ESRD on hemodialysis ([place])   . Essential hypertension, benign   . GERD (gastroesophageal reflux disease)   . Headache   . History of pneumonia   . HOH (hard of hearing)    Bilateral  hearing aids  . Hypothyroidism   . Persistent atrial fibrillation (HCC)    Declines coumadin  . Presence of permanent cardiac pacemaker   . Sick sinus syndrome [facility])    Status post pacemaker placement 2008  . Skin cancer, basal cell     Past Surgical History:  Procedure Laterality Date  . APPENDECTOMY    . AV FISTULA PLACEMENT Left 08/16/2014   Procedure: Left Arm creation of Brachiocephalic ARTERIOVENOUS (AV) FISTULA ;  Surgeon:  Angelia Mould, MD;  Location: [place];  Service: Vascular;  Laterality: Left;  . CHOLECYSTECTOMY    . COLONOSCOPY W/ BIOPSIES AND POLYPECTOMY    . EYE SURGERY    . LAMINECTOMY    . PACEMAKER GENERATOR CHANGE  03/03/11   MDT Adaptal L implanted by Dr Rayann Heman  . PACEMAKER PLACEMENT  2008   Medtronic - Dr. Doreatha Lew  . PERIPHERAL VASCULAR CATHETERIZATION Left 03/06/2015   Procedure: Fistulagram;  Surgeon: Serafina Mitchell, MD;  Location: [place] CV LAB;  Service: Cardiovascular;  Laterality: Left;  . PERMANENT PACEMAKER GENERATOR CHANGE N/A 03/03/2011   Procedure: PERMANENT PACEMAKER GENERATOR CHANGE;  Surgeon: Thompson Grayer, MD;  Location: [facility] CATH LAB;  Service: Cardiovascular;  Laterality: N/A;  . Right rotator cuff repair    . THROMBECTOMY AND REVISION OF ARTERIOVENTOUS (AV) GORETEX  GRAFT Left 03/11/2015   Procedure: THROMBECTOMY AND REVISION OF ARTERIOVENTOUS (AV) GORETEX  GRAFT LEFT ARM;  Surgeon: Rosetta Posner, MD;  Location: [place];  Service: Vascular;  Laterality: Left;  . TONSILLECTOMY      Current Outpatient Prescriptions  Medication Sig Dispense Refill  . amiodarone (PACERONE) 200 MG tablet Take 200 mg by mouth daily.    Marland Kitchen aspirin 81 MG tablet Take 81 mg by mouth daily.      Marland Kitchen atenolol (TENORMIN) 25 MG tablet Take 0.5 tablets (12.5 mg total) by mouth daily. 30 tablet 6  . docusate sodium (COLACE) 100 MG capsule  Take 100 mg by mouth as needed for constipation.    . fluticasone furoate-vilanterol (BREO ELLIPTA) 200-25 MCG/INH AEPB Inhale 1 puff into the lungs daily.    . folic acid (FOLVITE) 1 MG tablet Take 1 mg by mouth daily.      Marland Kitchen gabapentin (NEURONTIN) 400 MG capsule Take 400 mg by mouth daily.      Marland Kitchen HYDROcodone-acetaminophen (NORCO) 10-325 MG tablet Take 1 tablet by mouth every 6 (six) hours as needed for severe pain. 30 tablet 0  . levothyroxine (SYNTHROID, LEVOTHROID) 112 MCG tablet Take 112 mcg by mouth daily.    . midodrine (PROAMATINE) 10 MG tablet Take 1 tablet by  mouth 2 (two) times daily.    . nitroGLYCERIN (NITROSTAT) 0.4 MG SL tablet Place 1 tablet (0.4 mg total) under the tongue every 5 (five) minutes as needed. (Patient taking differently: Place 0.4 mg under the tongue every 5 (five) minutes as needed for chest pain. ) 25 tablet 2  . omeprazole (PRILOSEC) 20 MG capsule Take 20 mg by mouth daily.       No current facility-administered medications for this visit.    Allergies:  Oxycodone hcl   Social History: The patient  reports that he quit smoking about 56 years ago. His smoking use included Cigarettes. He started smoking about 68 years ago. He has a 9.60 pack-year smoking history. He quit smokeless tobacco use about 36 years ago. His smokeless tobacco use included Chew. He reports that he drinks alcohol. He reports that he does not use drugs.   ROS:  Please see the history of present illness. Otherwise, complete review of systems is positive for decreased hearing, arthritic pains, uses a walker.  All other systems are reviewed and negative.   Physical Exam: VS:  BP 130/60   Pulse 79   Ht 5\' 10"  (1.778 m)   Wt 176 lb (79.8 kg)   SpO2 91%   BMI 25.25 kg/m , BMI Body mass index is 25.25 kg/m.  Wt Readings from Last 3 Encounters:  02/26/16 176 lb (79.8 kg)  10/28/15 192 lb (87.1 kg)  08/30/15 197 lb (89.4 kg)    General: Elderly male, appears comfortable at rest. Has rolling walker. HEENT: Conjunctiva and lids normal, oropharynx clear. Neck: Supple, no elevated JVP or carotid bruits, no thyromegaly. Lungs: Clear to auscultation, nonlabored breathing at rest. Cardiac: RRR, no S3, soft systolic murmur, no pericardial rub. Abdomen: Soft, nontender, bowel sounds present. Extremities: Mild ankle edema, distal pulses 2+. Left arm AV fistula. Skin: Warm and dry. Musculoskeletal: Mld kyphosis. Neuropsychiatric: Alert and oriented 3, affect appropriate.  ECG: I personally reviewed the tracing from 11/30/2014 which showed rate-controlled  atrial fibrillation with left bundle branch block.  Recent Labwork: 03/06/2015: BUN 66; Creatinine, Ser 6.30 03/11/2015: Hemoglobin 12.2; Potassium 4.3; Sodium 141   Other Studies Reviewed Today:  Lexiscan Cardiolite June 2014: Low risk with possible inferior scar and minor peri-infarct ischemia, LVEF 43%.   Echocardiogram 06/29/2012: Study Conclusions  - Left ventricle: The cavity size was normal. There was mild concentric hypertrophy. Systolic function was mildly to moderately reduced. The estimated ejection fraction was in the range of 40% to 45%. There is hypokinesis of the basal-midinferior myocardium. There is hypokinesis of the mid-distalanteroseptal myocardium. Doppler parameters are consistent with abnormal left ventricular relaxation (grade 1 diastolic dysfunction). - Aortic valve: Trileaflet; mildly thickened leaflets. Trivial regurgitation. Mean gradient: 68mm Hg (S). Valve area: 2.67cm^2(VTI). - Mitral valve: Mild regurgitation. - Left atrium: The atrium was moderately  dilated. - Right ventricle: Pacer wire or catheter noted in right ventricle. - Right atrium: Central venous pressure: 9mm Hg (est). - Tricuspid valve: Mild regurgitation. - Pulmonic valve: Mild regurgitation. - Pulmonary arteries: PA peak pressure: 50mm Hg (S). - Pericardium, extracardiac: There was no pericardial effusion.  Assessment and Plan:  1. Paroxysmal atrial fibrillation. 44% mode switches by his last device check. He is not bothered by significant palpitations and we will plan to continue current regimen including amiodarone. He has declined anticoagulation.  2. Sick sinus syndrome status post Medtronic pacemaker, followed by Dr. Rayann Heman.  3. Essential hypertension, blood pressure control is adequate today.  4. History of nonobstructive CAD, no active angina symptoms.  5. End-stage renal disease, on hemodialysis per Dr. Lowanda Foster.  Current medicines were reviewed  with the patient today.   Orders Placed This Encounter  Procedures  . EKG 12-Lead    Disposition: Follow-up in 6 months.  Signed, Satira Sark, MD, [facility] 02/26/2016 10:50 AM    [facility] [place], [place], [place] [address] Phone: [phone]; Fax: [phone]

## 2016-02-25 DIAGNOSIS — D509 Iron deficiency anemia, unspecified: Secondary | ICD-10-CM | POA: Diagnosis not present

## 2016-02-25 DIAGNOSIS — D631 Anemia in chronic kidney disease: Secondary | ICD-10-CM | POA: Diagnosis not present

## 2016-02-25 DIAGNOSIS — Z23 Encounter for immunization: Secondary | ICD-10-CM | POA: Diagnosis not present

## 2016-02-25 DIAGNOSIS — N2581 Secondary hyperparathyroidism of renal origin: Secondary | ICD-10-CM | POA: Diagnosis not present

## 2016-02-25 DIAGNOSIS — Z992 Dependence on renal dialysis: Secondary | ICD-10-CM | POA: Diagnosis not present

## 2016-02-25 DIAGNOSIS — N186 End stage renal disease: Secondary | ICD-10-CM | POA: Diagnosis not present

## 2016-02-26 ENCOUNTER — Ambulatory Visit (INDEPENDENT_AMBULATORY_CARE_PROVIDER_SITE_OTHER): Payer: Medicare Other | Admitting: Cardiology

## 2016-02-26 ENCOUNTER — Encounter: Payer: Self-pay | Admitting: Cardiology

## 2016-02-26 VITALS — BP 130/60 | HR 79 | Ht 70.0 in | Wt 176.0 lb

## 2016-02-26 DIAGNOSIS — I480 Paroxysmal atrial fibrillation: Secondary | ICD-10-CM

## 2016-02-26 DIAGNOSIS — I2510 Atherosclerotic heart disease of native coronary artery without angina pectoris: Secondary | ICD-10-CM | POA: Diagnosis not present

## 2016-02-26 DIAGNOSIS — I495 Sick sinus syndrome: Secondary | ICD-10-CM

## 2016-02-26 DIAGNOSIS — I10 Essential (primary) hypertension: Secondary | ICD-10-CM

## 2016-02-26 NOTE — Patient Instructions (Signed)
Your physician wants you to follow-up in: 6 months with Dr. Mcdowell. You will receive a reminder letter in the mail two months in advance. If you don't receive a letter, please call our office to schedule the follow-up appointment.   Your physician recommends that you continue on your current medications as directed. Please refer to the Current Medication list given to you today.    Thank you for choosing [facility]!!       

## 2016-02-27 DIAGNOSIS — Z992 Dependence on renal dialysis: Secondary | ICD-10-CM | POA: Diagnosis not present

## 2016-02-27 DIAGNOSIS — D631 Anemia in chronic kidney disease: Secondary | ICD-10-CM | POA: Diagnosis not present

## 2016-02-27 DIAGNOSIS — N2581 Secondary hyperparathyroidism of renal origin: Secondary | ICD-10-CM | POA: Diagnosis not present

## 2016-02-27 DIAGNOSIS — D509 Iron deficiency anemia, unspecified: Secondary | ICD-10-CM | POA: Diagnosis not present

## 2016-02-27 DIAGNOSIS — Z23 Encounter for immunization: Secondary | ICD-10-CM | POA: Diagnosis not present

## 2016-02-27 DIAGNOSIS — N186 End stage renal disease: Secondary | ICD-10-CM | POA: Diagnosis not present

## 2016-02-28 DIAGNOSIS — Z299 Encounter for prophylactic measures, unspecified: Secondary | ICD-10-CM | POA: Diagnosis not present

## 2016-02-28 DIAGNOSIS — I770 Arteriovenous fistula, acquired: Secondary | ICD-10-CM | POA: Diagnosis not present

## 2016-02-28 DIAGNOSIS — N185 Chronic kidney disease, stage 5: Secondary | ICD-10-CM | POA: Diagnosis not present

## 2016-02-28 DIAGNOSIS — E663 Overweight: Secondary | ICD-10-CM | POA: Diagnosis not present

## 2016-02-28 DIAGNOSIS — M461 Sacroiliitis, not elsewhere classified: Secondary | ICD-10-CM | POA: Diagnosis not present

## 2016-02-28 DIAGNOSIS — M549 Dorsalgia, unspecified: Secondary | ICD-10-CM | POA: Diagnosis not present

## 2016-02-28 DIAGNOSIS — Z992 Dependence on renal dialysis: Secondary | ICD-10-CM | POA: Diagnosis not present

## 2016-02-28 DIAGNOSIS — I4891 Unspecified atrial fibrillation: Secondary | ICD-10-CM | POA: Diagnosis not present

## 2016-02-28 DIAGNOSIS — Z713 Dietary counseling and surveillance: Secondary | ICD-10-CM | POA: Diagnosis not present

## 2016-02-29 DIAGNOSIS — D631 Anemia in chronic kidney disease: Secondary | ICD-10-CM | POA: Diagnosis not present

## 2016-02-29 DIAGNOSIS — N186 End stage renal disease: Secondary | ICD-10-CM | POA: Diagnosis not present

## 2016-02-29 DIAGNOSIS — Z23 Encounter for immunization: Secondary | ICD-10-CM | POA: Diagnosis not present

## 2016-02-29 DIAGNOSIS — D509 Iron deficiency anemia, unspecified: Secondary | ICD-10-CM | POA: Diagnosis not present

## 2016-02-29 DIAGNOSIS — Z992 Dependence on renal dialysis: Secondary | ICD-10-CM | POA: Diagnosis not present

## 2016-02-29 DIAGNOSIS — N2581 Secondary hyperparathyroidism of renal origin: Secondary | ICD-10-CM | POA: Diagnosis not present

## 2016-03-03 DIAGNOSIS — N2581 Secondary hyperparathyroidism of renal origin: Secondary | ICD-10-CM | POA: Diagnosis not present

## 2016-03-03 DIAGNOSIS — Z992 Dependence on renal dialysis: Secondary | ICD-10-CM | POA: Diagnosis not present

## 2016-03-03 DIAGNOSIS — D509 Iron deficiency anemia, unspecified: Secondary | ICD-10-CM | POA: Diagnosis not present

## 2016-03-03 DIAGNOSIS — Z23 Encounter for immunization: Secondary | ICD-10-CM | POA: Diagnosis not present

## 2016-03-03 DIAGNOSIS — N186 End stage renal disease: Secondary | ICD-10-CM | POA: Diagnosis not present

## 2016-03-03 DIAGNOSIS — D631 Anemia in chronic kidney disease: Secondary | ICD-10-CM | POA: Diagnosis not present

## 2016-03-05 DIAGNOSIS — D509 Iron deficiency anemia, unspecified: Secondary | ICD-10-CM | POA: Diagnosis not present

## 2016-03-05 DIAGNOSIS — N2581 Secondary hyperparathyroidism of renal origin: Secondary | ICD-10-CM | POA: Diagnosis not present

## 2016-03-05 DIAGNOSIS — Z23 Encounter for immunization: Secondary | ICD-10-CM | POA: Diagnosis not present

## 2016-03-05 DIAGNOSIS — Z992 Dependence on renal dialysis: Secondary | ICD-10-CM | POA: Diagnosis not present

## 2016-03-05 DIAGNOSIS — N186 End stage renal disease: Secondary | ICD-10-CM | POA: Diagnosis not present

## 2016-03-05 DIAGNOSIS — D631 Anemia in chronic kidney disease: Secondary | ICD-10-CM | POA: Diagnosis not present

## 2016-03-07 DIAGNOSIS — D631 Anemia in chronic kidney disease: Secondary | ICD-10-CM | POA: Diagnosis not present

## 2016-03-07 DIAGNOSIS — Z992 Dependence on renal dialysis: Secondary | ICD-10-CM | POA: Diagnosis not present

## 2016-03-07 DIAGNOSIS — N186 End stage renal disease: Secondary | ICD-10-CM | POA: Diagnosis not present

## 2016-03-07 DIAGNOSIS — N2581 Secondary hyperparathyroidism of renal origin: Secondary | ICD-10-CM | POA: Diagnosis not present

## 2016-03-07 DIAGNOSIS — D509 Iron deficiency anemia, unspecified: Secondary | ICD-10-CM | POA: Diagnosis not present

## 2016-03-07 DIAGNOSIS — Z23 Encounter for immunization: Secondary | ICD-10-CM | POA: Diagnosis not present

## 2016-03-10 DIAGNOSIS — D631 Anemia in chronic kidney disease: Secondary | ICD-10-CM | POA: Diagnosis not present

## 2016-03-10 DIAGNOSIS — Z23 Encounter for immunization: Secondary | ICD-10-CM | POA: Diagnosis not present

## 2016-03-10 DIAGNOSIS — D509 Iron deficiency anemia, unspecified: Secondary | ICD-10-CM | POA: Diagnosis not present

## 2016-03-10 DIAGNOSIS — Z992 Dependence on renal dialysis: Secondary | ICD-10-CM | POA: Diagnosis not present

## 2016-03-10 DIAGNOSIS — N2581 Secondary hyperparathyroidism of renal origin: Secondary | ICD-10-CM | POA: Diagnosis not present

## 2016-03-10 DIAGNOSIS — N186 End stage renal disease: Secondary | ICD-10-CM | POA: Diagnosis not present

## 2016-03-12 DIAGNOSIS — N2581 Secondary hyperparathyroidism of renal origin: Secondary | ICD-10-CM | POA: Diagnosis not present

## 2016-03-12 DIAGNOSIS — Z992 Dependence on renal dialysis: Secondary | ICD-10-CM | POA: Diagnosis not present

## 2016-03-12 DIAGNOSIS — Z23 Encounter for immunization: Secondary | ICD-10-CM | POA: Diagnosis not present

## 2016-03-12 DIAGNOSIS — D509 Iron deficiency anemia, unspecified: Secondary | ICD-10-CM | POA: Diagnosis not present

## 2016-03-12 DIAGNOSIS — N186 End stage renal disease: Secondary | ICD-10-CM | POA: Diagnosis not present

## 2016-03-12 DIAGNOSIS — D631 Anemia in chronic kidney disease: Secondary | ICD-10-CM | POA: Diagnosis not present

## 2016-03-14 DIAGNOSIS — Z992 Dependence on renal dialysis: Secondary | ICD-10-CM | POA: Diagnosis not present

## 2016-03-14 DIAGNOSIS — N2581 Secondary hyperparathyroidism of renal origin: Secondary | ICD-10-CM | POA: Diagnosis not present

## 2016-03-14 DIAGNOSIS — D509 Iron deficiency anemia, unspecified: Secondary | ICD-10-CM | POA: Diagnosis not present

## 2016-03-14 DIAGNOSIS — N186 End stage renal disease: Secondary | ICD-10-CM | POA: Diagnosis not present

## 2016-03-14 DIAGNOSIS — Z23 Encounter for immunization: Secondary | ICD-10-CM | POA: Diagnosis not present

## 2016-03-14 DIAGNOSIS — D631 Anemia in chronic kidney disease: Secondary | ICD-10-CM | POA: Diagnosis not present

## 2016-03-17 DIAGNOSIS — N2581 Secondary hyperparathyroidism of renal origin: Secondary | ICD-10-CM | POA: Diagnosis not present

## 2016-03-17 DIAGNOSIS — Z992 Dependence on renal dialysis: Secondary | ICD-10-CM | POA: Diagnosis not present

## 2016-03-17 DIAGNOSIS — Z23 Encounter for immunization: Secondary | ICD-10-CM | POA: Diagnosis not present

## 2016-03-17 DIAGNOSIS — D631 Anemia in chronic kidney disease: Secondary | ICD-10-CM | POA: Diagnosis not present

## 2016-03-17 DIAGNOSIS — D509 Iron deficiency anemia, unspecified: Secondary | ICD-10-CM | POA: Diagnosis not present

## 2016-03-17 DIAGNOSIS — N186 End stage renal disease: Secondary | ICD-10-CM | POA: Diagnosis not present

## 2016-03-18 DIAGNOSIS — Z992 Dependence on renal dialysis: Secondary | ICD-10-CM | POA: Diagnosis not present

## 2016-03-18 DIAGNOSIS — N186 End stage renal disease: Secondary | ICD-10-CM | POA: Diagnosis not present

## 2016-03-19 DIAGNOSIS — Z992 Dependence on renal dialysis: Secondary | ICD-10-CM | POA: Diagnosis not present

## 2016-03-19 DIAGNOSIS — D509 Iron deficiency anemia, unspecified: Secondary | ICD-10-CM | POA: Diagnosis not present

## 2016-03-19 DIAGNOSIS — N2581 Secondary hyperparathyroidism of renal origin: Secondary | ICD-10-CM | POA: Diagnosis not present

## 2016-03-19 DIAGNOSIS — D631 Anemia in chronic kidney disease: Secondary | ICD-10-CM | POA: Diagnosis not present

## 2016-03-19 DIAGNOSIS — N186 End stage renal disease: Secondary | ICD-10-CM | POA: Diagnosis not present

## 2016-03-21 DIAGNOSIS — Z992 Dependence on renal dialysis: Secondary | ICD-10-CM | POA: Diagnosis not present

## 2016-03-21 DIAGNOSIS — D509 Iron deficiency anemia, unspecified: Secondary | ICD-10-CM | POA: Diagnosis not present

## 2016-03-21 DIAGNOSIS — N186 End stage renal disease: Secondary | ICD-10-CM | POA: Diagnosis not present

## 2016-03-21 DIAGNOSIS — N2581 Secondary hyperparathyroidism of renal origin: Secondary | ICD-10-CM | POA: Diagnosis not present

## 2016-03-21 DIAGNOSIS — D631 Anemia in chronic kidney disease: Secondary | ICD-10-CM | POA: Diagnosis not present

## 2016-03-24 DIAGNOSIS — D509 Iron deficiency anemia, unspecified: Secondary | ICD-10-CM | POA: Diagnosis not present

## 2016-03-24 DIAGNOSIS — N2581 Secondary hyperparathyroidism of renal origin: Secondary | ICD-10-CM | POA: Diagnosis not present

## 2016-03-24 DIAGNOSIS — D631 Anemia in chronic kidney disease: Secondary | ICD-10-CM | POA: Diagnosis not present

## 2016-03-24 DIAGNOSIS — Z992 Dependence on renal dialysis: Secondary | ICD-10-CM | POA: Diagnosis not present

## 2016-03-24 DIAGNOSIS — N186 End stage renal disease: Secondary | ICD-10-CM | POA: Diagnosis not present

## 2016-03-26 DIAGNOSIS — Z992 Dependence on renal dialysis: Secondary | ICD-10-CM | POA: Diagnosis not present

## 2016-03-26 DIAGNOSIS — N2581 Secondary hyperparathyroidism of renal origin: Secondary | ICD-10-CM | POA: Diagnosis not present

## 2016-03-26 DIAGNOSIS — D631 Anemia in chronic kidney disease: Secondary | ICD-10-CM | POA: Diagnosis not present

## 2016-03-26 DIAGNOSIS — D509 Iron deficiency anemia, unspecified: Secondary | ICD-10-CM | POA: Diagnosis not present

## 2016-03-26 DIAGNOSIS — N186 End stage renal disease: Secondary | ICD-10-CM | POA: Diagnosis not present

## 2016-03-28 DIAGNOSIS — Z992 Dependence on renal dialysis: Secondary | ICD-10-CM | POA: Diagnosis not present

## 2016-03-28 DIAGNOSIS — N2581 Secondary hyperparathyroidism of renal origin: Secondary | ICD-10-CM | POA: Diagnosis not present

## 2016-03-28 DIAGNOSIS — N186 End stage renal disease: Secondary | ICD-10-CM | POA: Diagnosis not present

## 2016-03-28 DIAGNOSIS — D509 Iron deficiency anemia, unspecified: Secondary | ICD-10-CM | POA: Diagnosis not present

## 2016-03-28 DIAGNOSIS — D631 Anemia in chronic kidney disease: Secondary | ICD-10-CM | POA: Diagnosis not present

## 2016-03-31 DIAGNOSIS — D631 Anemia in chronic kidney disease: Secondary | ICD-10-CM | POA: Diagnosis not present

## 2016-03-31 DIAGNOSIS — N2581 Secondary hyperparathyroidism of renal origin: Secondary | ICD-10-CM | POA: Diagnosis not present

## 2016-03-31 DIAGNOSIS — N186 End stage renal disease: Secondary | ICD-10-CM | POA: Diagnosis not present

## 2016-03-31 DIAGNOSIS — D509 Iron deficiency anemia, unspecified: Secondary | ICD-10-CM | POA: Diagnosis not present

## 2016-03-31 DIAGNOSIS — Z992 Dependence on renal dialysis: Secondary | ICD-10-CM | POA: Diagnosis not present

## 2016-04-02 DIAGNOSIS — D631 Anemia in chronic kidney disease: Secondary | ICD-10-CM | POA: Diagnosis not present

## 2016-04-02 DIAGNOSIS — N186 End stage renal disease: Secondary | ICD-10-CM | POA: Diagnosis not present

## 2016-04-02 DIAGNOSIS — N2581 Secondary hyperparathyroidism of renal origin: Secondary | ICD-10-CM | POA: Diagnosis not present

## 2016-04-02 DIAGNOSIS — Z992 Dependence on renal dialysis: Secondary | ICD-10-CM | POA: Diagnosis not present

## 2016-04-02 DIAGNOSIS — D509 Iron deficiency anemia, unspecified: Secondary | ICD-10-CM | POA: Diagnosis not present

## 2016-04-04 DIAGNOSIS — Z992 Dependence on renal dialysis: Secondary | ICD-10-CM | POA: Diagnosis not present

## 2016-04-04 DIAGNOSIS — D631 Anemia in chronic kidney disease: Secondary | ICD-10-CM | POA: Diagnosis not present

## 2016-04-04 DIAGNOSIS — D509 Iron deficiency anemia, unspecified: Secondary | ICD-10-CM | POA: Diagnosis not present

## 2016-04-04 DIAGNOSIS — N186 End stage renal disease: Secondary | ICD-10-CM | POA: Diagnosis not present

## 2016-04-04 DIAGNOSIS — N2581 Secondary hyperparathyroidism of renal origin: Secondary | ICD-10-CM | POA: Diagnosis not present

## 2016-04-07 DIAGNOSIS — N2581 Secondary hyperparathyroidism of renal origin: Secondary | ICD-10-CM | POA: Diagnosis not present

## 2016-04-07 DIAGNOSIS — D509 Iron deficiency anemia, unspecified: Secondary | ICD-10-CM | POA: Diagnosis not present

## 2016-04-07 DIAGNOSIS — D631 Anemia in chronic kidney disease: Secondary | ICD-10-CM | POA: Diagnosis not present

## 2016-04-07 DIAGNOSIS — Z992 Dependence on renal dialysis: Secondary | ICD-10-CM | POA: Diagnosis not present

## 2016-04-07 DIAGNOSIS — N186 End stage renal disease: Secondary | ICD-10-CM | POA: Diagnosis not present

## 2016-04-09 DIAGNOSIS — N186 End stage renal disease: Secondary | ICD-10-CM | POA: Diagnosis not present

## 2016-04-09 DIAGNOSIS — D509 Iron deficiency anemia, unspecified: Secondary | ICD-10-CM | POA: Diagnosis not present

## 2016-04-09 DIAGNOSIS — D631 Anemia in chronic kidney disease: Secondary | ICD-10-CM | POA: Diagnosis not present

## 2016-04-09 DIAGNOSIS — Z992 Dependence on renal dialysis: Secondary | ICD-10-CM | POA: Diagnosis not present

## 2016-04-09 DIAGNOSIS — N2581 Secondary hyperparathyroidism of renal origin: Secondary | ICD-10-CM | POA: Diagnosis not present

## 2016-04-11 DIAGNOSIS — D631 Anemia in chronic kidney disease: Secondary | ICD-10-CM | POA: Diagnosis not present

## 2016-04-11 DIAGNOSIS — N186 End stage renal disease: Secondary | ICD-10-CM | POA: Diagnosis not present

## 2016-04-11 DIAGNOSIS — N2581 Secondary hyperparathyroidism of renal origin: Secondary | ICD-10-CM | POA: Diagnosis not present

## 2016-04-11 DIAGNOSIS — Z992 Dependence on renal dialysis: Secondary | ICD-10-CM | POA: Diagnosis not present

## 2016-04-11 DIAGNOSIS — D509 Iron deficiency anemia, unspecified: Secondary | ICD-10-CM | POA: Diagnosis not present

## 2016-04-14 DIAGNOSIS — D631 Anemia in chronic kidney disease: Secondary | ICD-10-CM | POA: Diagnosis not present

## 2016-04-14 DIAGNOSIS — N2581 Secondary hyperparathyroidism of renal origin: Secondary | ICD-10-CM | POA: Diagnosis not present

## 2016-04-14 DIAGNOSIS — Z992 Dependence on renal dialysis: Secondary | ICD-10-CM | POA: Diagnosis not present

## 2016-04-14 DIAGNOSIS — N186 End stage renal disease: Secondary | ICD-10-CM | POA: Diagnosis not present

## 2016-04-14 DIAGNOSIS — D509 Iron deficiency anemia, unspecified: Secondary | ICD-10-CM | POA: Diagnosis not present

## 2016-04-16 DIAGNOSIS — D509 Iron deficiency anemia, unspecified: Secondary | ICD-10-CM | POA: Diagnosis not present

## 2016-04-16 DIAGNOSIS — D631 Anemia in chronic kidney disease: Secondary | ICD-10-CM | POA: Diagnosis not present

## 2016-04-16 DIAGNOSIS — Z992 Dependence on renal dialysis: Secondary | ICD-10-CM | POA: Diagnosis not present

## 2016-04-16 DIAGNOSIS — N2581 Secondary hyperparathyroidism of renal origin: Secondary | ICD-10-CM | POA: Diagnosis not present

## 2016-04-16 DIAGNOSIS — N186 End stage renal disease: Secondary | ICD-10-CM | POA: Diagnosis not present

## 2016-04-18 DIAGNOSIS — D509 Iron deficiency anemia, unspecified: Secondary | ICD-10-CM | POA: Diagnosis not present

## 2016-04-18 DIAGNOSIS — N2581 Secondary hyperparathyroidism of renal origin: Secondary | ICD-10-CM | POA: Diagnosis not present

## 2016-04-18 DIAGNOSIS — N186 End stage renal disease: Secondary | ICD-10-CM | POA: Diagnosis not present

## 2016-04-18 DIAGNOSIS — Z992 Dependence on renal dialysis: Secondary | ICD-10-CM | POA: Diagnosis not present

## 2016-04-18 DIAGNOSIS — D631 Anemia in chronic kidney disease: Secondary | ICD-10-CM | POA: Diagnosis not present

## 2016-04-20 DIAGNOSIS — Z713 Dietary counseling and surveillance: Secondary | ICD-10-CM | POA: Diagnosis not present

## 2016-04-20 DIAGNOSIS — M549 Dorsalgia, unspecified: Secondary | ICD-10-CM | POA: Diagnosis not present

## 2016-04-20 DIAGNOSIS — Z789 Other specified health status: Secondary | ICD-10-CM | POA: Diagnosis not present

## 2016-04-20 DIAGNOSIS — Z299 Encounter for prophylactic measures, unspecified: Secondary | ICD-10-CM | POA: Diagnosis not present

## 2016-04-20 DIAGNOSIS — N189 Chronic kidney disease, unspecified: Secondary | ICD-10-CM | POA: Diagnosis not present

## 2016-04-20 DIAGNOSIS — I4891 Unspecified atrial fibrillation: Secondary | ICD-10-CM | POA: Diagnosis not present

## 2016-04-20 DIAGNOSIS — I10 Essential (primary) hypertension: Secondary | ICD-10-CM | POA: Diagnosis not present

## 2016-04-20 DIAGNOSIS — Z6829 Body mass index (BMI) 29.0-29.9, adult: Secondary | ICD-10-CM | POA: Diagnosis not present

## 2016-04-20 DIAGNOSIS — N185 Chronic kidney disease, stage 5: Secondary | ICD-10-CM | POA: Diagnosis not present

## 2016-04-21 DIAGNOSIS — D631 Anemia in chronic kidney disease: Secondary | ICD-10-CM | POA: Diagnosis not present

## 2016-04-21 DIAGNOSIS — Z992 Dependence on renal dialysis: Secondary | ICD-10-CM | POA: Diagnosis not present

## 2016-04-21 DIAGNOSIS — N186 End stage renal disease: Secondary | ICD-10-CM | POA: Diagnosis not present

## 2016-04-21 DIAGNOSIS — D509 Iron deficiency anemia, unspecified: Secondary | ICD-10-CM | POA: Diagnosis not present

## 2016-04-23 DIAGNOSIS — Z992 Dependence on renal dialysis: Secondary | ICD-10-CM | POA: Diagnosis not present

## 2016-04-23 DIAGNOSIS — D631 Anemia in chronic kidney disease: Secondary | ICD-10-CM | POA: Diagnosis not present

## 2016-04-23 DIAGNOSIS — D509 Iron deficiency anemia, unspecified: Secondary | ICD-10-CM | POA: Diagnosis not present

## 2016-04-23 DIAGNOSIS — N186 End stage renal disease: Secondary | ICD-10-CM | POA: Diagnosis not present

## 2016-04-24 ENCOUNTER — Encounter: Payer: Medicare Other | Admitting: Internal Medicine

## 2016-04-25 DIAGNOSIS — D509 Iron deficiency anemia, unspecified: Secondary | ICD-10-CM | POA: Diagnosis not present

## 2016-04-25 DIAGNOSIS — D631 Anemia in chronic kidney disease: Secondary | ICD-10-CM | POA: Diagnosis not present

## 2016-04-25 DIAGNOSIS — Z992 Dependence on renal dialysis: Secondary | ICD-10-CM | POA: Diagnosis not present

## 2016-04-25 DIAGNOSIS — N186 End stage renal disease: Secondary | ICD-10-CM | POA: Diagnosis not present

## 2016-04-28 DIAGNOSIS — N186 End stage renal disease: Secondary | ICD-10-CM | POA: Diagnosis not present

## 2016-04-28 DIAGNOSIS — D631 Anemia in chronic kidney disease: Secondary | ICD-10-CM | POA: Diagnosis not present

## 2016-04-28 DIAGNOSIS — Z992 Dependence on renal dialysis: Secondary | ICD-10-CM | POA: Diagnosis not present

## 2016-04-28 DIAGNOSIS — D509 Iron deficiency anemia, unspecified: Secondary | ICD-10-CM | POA: Diagnosis not present

## 2016-04-30 DIAGNOSIS — N186 End stage renal disease: Secondary | ICD-10-CM | POA: Diagnosis not present

## 2016-04-30 DIAGNOSIS — Z992 Dependence on renal dialysis: Secondary | ICD-10-CM | POA: Diagnosis not present

## 2016-04-30 DIAGNOSIS — D509 Iron deficiency anemia, unspecified: Secondary | ICD-10-CM | POA: Diagnosis not present

## 2016-04-30 DIAGNOSIS — D631 Anemia in chronic kidney disease: Secondary | ICD-10-CM | POA: Diagnosis not present

## 2016-05-01 ENCOUNTER — Encounter: Payer: Self-pay | Admitting: Internal Medicine

## 2016-05-01 ENCOUNTER — Ambulatory Visit (INDEPENDENT_AMBULATORY_CARE_PROVIDER_SITE_OTHER): Payer: Medicare Other | Admitting: Internal Medicine

## 2016-05-01 VITALS — BP 134/66 | HR 69 | Ht 70.0 in | Wt 177.2 lb

## 2016-05-01 DIAGNOSIS — I2510 Atherosclerotic heart disease of native coronary artery without angina pectoris: Secondary | ICD-10-CM

## 2016-05-01 DIAGNOSIS — I10 Essential (primary) hypertension: Secondary | ICD-10-CM

## 2016-05-01 DIAGNOSIS — I495 Sick sinus syndrome: Secondary | ICD-10-CM

## 2016-05-01 DIAGNOSIS — I4819 Other persistent atrial fibrillation: Secondary | ICD-10-CM

## 2016-05-01 DIAGNOSIS — I481 Persistent atrial fibrillation: Secondary | ICD-10-CM

## 2016-05-01 DIAGNOSIS — Z950 Presence of cardiac pacemaker: Secondary | ICD-10-CM

## 2016-05-01 NOTE — Progress Notes (Signed)
Electrophysiology Office Note   Date:  05/01/2016   ID:  Dale Torres, Dale Torres September 06, 1925, MRN [id number]  PCP:  Monico Blitz, MD  Cardiologist:  Dr Domenic Polite Primary Electrophysiologist: Thompson Grayer, MD    CC: edema   History of Present Illness: Dale Torres is a 83 y.o. male who presents today for electrophysiology evaluation.   Doing reasonably well.  Not having hypotension with HD.  Edema is chronically an issue.   Now back on amiodarone and maintaining sinus rhythm.   Today, he denies symptoms of palpitations, chest pain,  orthopnea, PND,  claudication, dizziness, presyncope, syncope, bleeding, or neurologic sequela. The patient is tolerating medications without difficulties and is otherwise without complaint today.    Past Medical History:  Diagnosis Date  . Anemia    Dr. Sonny Dandy  . Arthritis   . Benign prostatic hypertrophy   . Carpal tunnel syndrome of left wrist   . Coronary atherosclerosis of native coronary artery    Nonobstructive 2009  . ESRD on hemodialysis ([place])   . Essential hypertension, benign   . GERD (gastroesophageal reflux disease)   . Headache   . History of pneumonia   . HOH (hard of hearing)    Bilateral  hearing aids  . Hypothyroidism   . Persistent atrial fibrillation (HCC)    Declines coumadin  . Presence of permanent cardiac pacemaker   . Sick sinus syndrome [facility])    Status post pacemaker placement 2008  . Skin cancer, basal cell    Past Surgical History:  Procedure Laterality Date  . APPENDECTOMY    . AV FISTULA PLACEMENT Left 08/16/2014   Procedure: Left Arm creation of Brachiocephalic ARTERIOVENOUS (AV) FISTULA ;  Surgeon: Angelia Mould, MD;  Location: [place];  Service: Vascular;  Laterality: Left;  . CHOLECYSTECTOMY    . COLONOSCOPY W/ BIOPSIES AND POLYPECTOMY    . EYE SURGERY    . LAMINECTOMY    . PACEMAKER GENERATOR CHANGE  03/03/11   MDT Adaptal L implanted by Dr Rayann Heman  . PACEMAKER PLACEMENT  2008   Medtronic - Dr. Doreatha Lew    . PERIPHERAL VASCULAR CATHETERIZATION Left 03/06/2015   Procedure: Fistulagram;  Surgeon: Serafina Mitchell, MD;  Location: [place] CV LAB;  Service: Cardiovascular;  Laterality: Left;  . PERMANENT PACEMAKER GENERATOR CHANGE N/A 03/03/2011   Procedure: PERMANENT PACEMAKER GENERATOR CHANGE;  Surgeon: Thompson Grayer, MD;  Location: [facility] CATH LAB;  Service: Cardiovascular;  Laterality: N/A;  . Right rotator cuff repair    . THROMBECTOMY AND REVISION OF ARTERIOVENTOUS (AV) GORETEX  GRAFT Left 03/11/2015   Procedure: THROMBECTOMY AND REVISION OF ARTERIOVENTOUS (AV) GORETEX  GRAFT LEFT ARM;  Surgeon: Rosetta Posner, MD;  Location: [place];  Service: Vascular;  Laterality: Left;  . TONSILLECTOMY       Current Outpatient Prescriptions  Medication Sig Dispense Refill  . atenolol (TENORMIN) 25 MG tablet Take 12.5 mg by mouth daily.    . sevelamer carbonate (RENVELA) 800 MG tablet Take 800 mg by mouth 3 (three) times daily with meals.    Marland Kitchen amiodarone (PACERONE) 200 MG tablet Take 200 mg by mouth daily.    Marland Kitchen aspirin 81 MG tablet Take 81 mg by mouth daily.      Marland Kitchen docusate sodium (COLACE) 100 MG capsule Take 100 mg by mouth as needed for constipation.    . fluticasone furoate-vilanterol (BREO ELLIPTA) 200-25 MCG/INH AEPB Inhale 1 puff into the lungs daily.    . folic acid (FOLVITE) 1  MG tablet Take 1 mg by mouth daily.      Marland Kitchen gabapentin (NEURONTIN) 400 MG capsule Take 400 mg by mouth daily.      Marland Kitchen HYDROcodone-acetaminophen (NORCO) 10-325 MG tablet Take 1 tablet by mouth every 6 (six) hours as needed for severe pain. 30 tablet 0  . levothyroxine (SYNTHROID, LEVOTHROID) 112 MCG tablet Take 112 mcg by mouth daily.    . midodrine (PROAMATINE) 10 MG tablet Take 1 tablet by mouth 2 (two) times daily.    . nitroGLYCERIN (NITROSTAT) 0.4 MG SL tablet Place 1 tablet (0.4 mg total) under the tongue every 5 (five) minutes as needed. (Patient taking differently: Place 0.4 mg under the tongue every 5 (five) minutes as needed  for chest pain. ) 25 tablet 2  . omeprazole (PRILOSEC) 20 MG capsule Take 20 mg by mouth daily.       No current facility-administered medications for this visit.     Allergies:   Oxycodone hcl   Social History:  The patient  reports that he quit smoking about 56 years ago. His smoking use included Cigarettes. He started smoking about 68 years ago. He has a 9.60 pack-year smoking history. He quit smokeless tobacco use about 36 years ago. His smokeless tobacco use included Chew. He reports that he drinks alcohol. He reports that he does not use drugs.   Family History:  The patient's family history includes Coronary artery disease in his father, mother, and sister; Heart disease in his father, mother, and sister.    ROS:  Please see the history of present illness.   All other systems are reviewed and negative.    PHYSICAL EXAM: VS:  BP 134/66 (BP Location: Right Arm, Cuff Size: Normal)   Pulse 69   Ht 5\' 10"  (1.778 m)   Wt 177 lb 3.2 oz (80.4 kg)   SpO2 99%   BMI 25.43 kg/m  , BMI Body mass index is 25.43 kg/m. GEN: NAD, A&O x 3 HEENT: normal  Neck: no JVD, carotid bruits, or masses Cardiac: RRR;   +2 edema  Respiratory:  clear to auscultation bilaterally, normal work of breathing GI: soft, nontender, nondistended, + BS MS: diffuse muscle atrophy Skin: warm and dry, device pocket is well healed Neuro:  Strength and sensation are intact Psych: euthymic mood, full affect  Device interrogation is reviewed today in detail.  See PaceArt for details.  Wt Readings from Last 3 Encounters:  05/01/16 177 lb 3.2 oz (80.4 kg)  02/26/16 176 lb (79.8 kg)  10/28/15 192 lb (87.1 kg)    ekg today reveals atrial pacing, LBBB  ASSESSMENT AND PLAN:  1. Sick sinus syndrome Normal pacemaker function See Pace Art report No changes today  2. afib Now back on amiodarone per Dr Domenic Polite.  AF is well controlled (33% burden per PPM but none since this past fall). Not a watchman candidate  given advanced age He declines anticoagulation I have instructed patient to have either Dr Domenic Polite or Dr Manuella Ghazi follow LFTs, TFTs twice per year.  carelink Return in 1 year Follow-up with Dr Domenic Polite as scheduled  Current medicines are reviewed at length with the patient today.   The patient does not have concerns regarding his medicines.  The following changes were made today:  none  Labs/ tests ordered today include:  Orders Placed This Encounter  Procedures  . EKG 12-Lead   Signed, Thompson Grayer, MD  05/01/2016 10:55 AM     [place]  [address] [place] [address] [phone] (office) [phone] (fax)

## 2016-05-01 NOTE — Patient Instructions (Signed)
Medication Instructions:  Continue all current medications.  Labwork: none  Testing/Procedures: none  Follow-Up: Your physician wants you to follow up in:  1 year.  You will receive a reminder letter in the mail one-two months in advance.  If you don't receive a letter, please call our office to schedule the follow up appointment - Dr. Rayann Heman.  Any Other Special Instructions Will Be Listed Below (If Applicable). Remote monitoring is used to monitor your Pacemaker of ICD from home. This monitoring reduces the number of office visits required to check your device to one time per year. It allows [place] to keep an eye on the functioning of your device to ensure it is working properly. You are scheduled for a device check from home on 08/03/2016.  You may send your transmission at any time that day. If you have a wireless device, the transmission will be sent automatically. After your physician reviews your transmission, you will receive a postcard with your next transmission date.  If you need a refill on your cardiac medications before your next appointment, please call your pharmacy.

## 2016-05-02 DIAGNOSIS — Z992 Dependence on renal dialysis: Secondary | ICD-10-CM | POA: Diagnosis not present

## 2016-05-02 DIAGNOSIS — N186 End stage renal disease: Secondary | ICD-10-CM | POA: Diagnosis not present

## 2016-05-02 DIAGNOSIS — D631 Anemia in chronic kidney disease: Secondary | ICD-10-CM | POA: Diagnosis not present

## 2016-05-02 DIAGNOSIS — D509 Iron deficiency anemia, unspecified: Secondary | ICD-10-CM | POA: Diagnosis not present

## 2016-05-05 DIAGNOSIS — D631 Anemia in chronic kidney disease: Secondary | ICD-10-CM | POA: Diagnosis not present

## 2016-05-05 DIAGNOSIS — N186 End stage renal disease: Secondary | ICD-10-CM | POA: Diagnosis not present

## 2016-05-05 DIAGNOSIS — D509 Iron deficiency anemia, unspecified: Secondary | ICD-10-CM | POA: Diagnosis not present

## 2016-05-05 DIAGNOSIS — Z992 Dependence on renal dialysis: Secondary | ICD-10-CM | POA: Diagnosis not present

## 2016-05-05 LAB — CUP PACEART INCLINIC DEVICE CHECK
Battery Voltage: 2.79 V
Brady Statistic AP VP Percent: 2 %
Brady Statistic AP VS Percent: 79 %
Brady Statistic AS VP Percent: 0 %
Brady Statistic AS VS Percent: 19 %
Date Time Interrogation Session: 20180413150104
Implantable Lead Implant Date: 20080604
Implantable Lead Location: 753860
Implantable Lead Model: 4469
Implantable Lead Model: 4470
Implantable Lead Serial Number: 498205
Lead Channel Impedance Value: 428 Ohm
Lead Channel Pacing Threshold Amplitude: 0.5 V
Lead Channel Pacing Threshold Amplitude: 0.5 V
Lead Channel Pacing Threshold Amplitude: 0.625 V
Lead Channel Pacing Threshold Pulse Width: 0.4 ms
Lead Channel Pacing Threshold Pulse Width: 0.4 ms
Lead Channel Pacing Threshold Pulse Width: 0.4 ms
Lead Channel Sensing Intrinsic Amplitude: 0.5 mV
Lead Channel Sensing Intrinsic Amplitude: 8 mV
Lead Channel Setting Pacing Amplitude: 2 V
Lead Channel Setting Sensing Sensitivity: 2.8 mV
MDC IDC LEAD IMPLANT DT: 20080604
MDC IDC LEAD LOCATION: 753859
MDC IDC LEAD SERIAL: 601713
MDC IDC MSMT BATTERY IMPEDANCE: 210 Ohm
MDC IDC MSMT BATTERY REMAINING LONGEVITY: 119 mo
MDC IDC MSMT LEADCHNL RA IMPEDANCE VALUE: 417 Ohm
MDC IDC MSMT LEADCHNL RA PACING THRESHOLD AMPLITUDE: 0.5 V
MDC IDC MSMT LEADCHNL RA PACING THRESHOLD AMPLITUDE: 1.25 V
MDC IDC MSMT LEADCHNL RA PACING THRESHOLD PULSEWIDTH: 0.21 ms
MDC IDC MSMT LEADCHNL RA PACING THRESHOLD PULSEWIDTH: 0.4 ms
MDC IDC PG IMPLANT DT: 20130212
MDC IDC SET LEADCHNL RV PACING AMPLITUDE: 2.5 V
MDC IDC SET LEADCHNL RV PACING PULSEWIDTH: 0.4 ms

## 2016-05-07 DIAGNOSIS — I4891 Unspecified atrial fibrillation: Secondary | ICD-10-CM | POA: Diagnosis not present

## 2016-05-07 DIAGNOSIS — K5900 Constipation, unspecified: Secondary | ICD-10-CM | POA: Diagnosis not present

## 2016-05-07 DIAGNOSIS — R3911 Hesitancy of micturition: Secondary | ICD-10-CM | POA: Diagnosis not present

## 2016-05-07 DIAGNOSIS — Z299 Encounter for prophylactic measures, unspecified: Secondary | ICD-10-CM | POA: Diagnosis not present

## 2016-05-07 DIAGNOSIS — R339 Retention of urine, unspecified: Secondary | ICD-10-CM | POA: Diagnosis not present

## 2016-05-07 DIAGNOSIS — N186 End stage renal disease: Secondary | ICD-10-CM | POA: Diagnosis not present

## 2016-05-07 DIAGNOSIS — D509 Iron deficiency anemia, unspecified: Secondary | ICD-10-CM | POA: Diagnosis not present

## 2016-05-07 DIAGNOSIS — N185 Chronic kidney disease, stage 5: Secondary | ICD-10-CM | POA: Diagnosis not present

## 2016-05-07 DIAGNOSIS — Z713 Dietary counseling and surveillance: Secondary | ICD-10-CM | POA: Diagnosis not present

## 2016-05-07 DIAGNOSIS — D631 Anemia in chronic kidney disease: Secondary | ICD-10-CM | POA: Diagnosis not present

## 2016-05-07 DIAGNOSIS — Z992 Dependence on renal dialysis: Secondary | ICD-10-CM | POA: Diagnosis not present

## 2016-05-07 DIAGNOSIS — Z6829 Body mass index (BMI) 29.0-29.9, adult: Secondary | ICD-10-CM | POA: Diagnosis not present

## 2016-05-07 DIAGNOSIS — I10 Essential (primary) hypertension: Secondary | ICD-10-CM | POA: Diagnosis not present

## 2016-05-09 DIAGNOSIS — N186 End stage renal disease: Secondary | ICD-10-CM | POA: Diagnosis not present

## 2016-05-09 DIAGNOSIS — D509 Iron deficiency anemia, unspecified: Secondary | ICD-10-CM | POA: Diagnosis not present

## 2016-05-09 DIAGNOSIS — Z992 Dependence on renal dialysis: Secondary | ICD-10-CM | POA: Diagnosis not present

## 2016-05-09 DIAGNOSIS — D631 Anemia in chronic kidney disease: Secondary | ICD-10-CM | POA: Diagnosis not present

## 2016-05-12 DIAGNOSIS — D509 Iron deficiency anemia, unspecified: Secondary | ICD-10-CM | POA: Diagnosis not present

## 2016-05-12 DIAGNOSIS — D631 Anemia in chronic kidney disease: Secondary | ICD-10-CM | POA: Diagnosis not present

## 2016-05-12 DIAGNOSIS — N186 End stage renal disease: Secondary | ICD-10-CM | POA: Diagnosis not present

## 2016-05-12 DIAGNOSIS — Z992 Dependence on renal dialysis: Secondary | ICD-10-CM | POA: Diagnosis not present

## 2016-05-14 DIAGNOSIS — Z992 Dependence on renal dialysis: Secondary | ICD-10-CM | POA: Diagnosis not present

## 2016-05-14 DIAGNOSIS — N186 End stage renal disease: Secondary | ICD-10-CM | POA: Diagnosis not present

## 2016-05-14 DIAGNOSIS — D631 Anemia in chronic kidney disease: Secondary | ICD-10-CM | POA: Diagnosis not present

## 2016-05-14 DIAGNOSIS — D509 Iron deficiency anemia, unspecified: Secondary | ICD-10-CM | POA: Diagnosis not present

## 2016-05-15 DIAGNOSIS — R338 Other retention of urine: Secondary | ICD-10-CM | POA: Diagnosis not present

## 2016-05-16 DIAGNOSIS — D509 Iron deficiency anemia, unspecified: Secondary | ICD-10-CM | POA: Diagnosis not present

## 2016-05-16 DIAGNOSIS — N186 End stage renal disease: Secondary | ICD-10-CM | POA: Diagnosis not present

## 2016-05-16 DIAGNOSIS — D631 Anemia in chronic kidney disease: Secondary | ICD-10-CM | POA: Diagnosis not present

## 2016-05-16 DIAGNOSIS — Z992 Dependence on renal dialysis: Secondary | ICD-10-CM | POA: Diagnosis not present

## 2016-05-18 DIAGNOSIS — I770 Arteriovenous fistula, acquired: Secondary | ICD-10-CM | POA: Diagnosis not present

## 2016-05-18 DIAGNOSIS — N400 Enlarged prostate without lower urinary tract symptoms: Secondary | ICD-10-CM | POA: Diagnosis not present

## 2016-05-18 DIAGNOSIS — K5900 Constipation, unspecified: Secondary | ICD-10-CM | POA: Diagnosis not present

## 2016-05-18 DIAGNOSIS — Z992 Dependence on renal dialysis: Secondary | ICD-10-CM | POA: Diagnosis not present

## 2016-05-18 DIAGNOSIS — Z6828 Body mass index (BMI) 28.0-28.9, adult: Secondary | ICD-10-CM | POA: Diagnosis not present

## 2016-05-18 DIAGNOSIS — M1710 Unilateral primary osteoarthritis, unspecified knee: Secondary | ICD-10-CM | POA: Diagnosis not present

## 2016-05-18 DIAGNOSIS — K219 Gastro-esophageal reflux disease without esophagitis: Secondary | ICD-10-CM | POA: Diagnosis not present

## 2016-05-18 DIAGNOSIS — R159 Full incontinence of feces: Secondary | ICD-10-CM | POA: Diagnosis not present

## 2016-05-18 DIAGNOSIS — I4891 Unspecified atrial fibrillation: Secondary | ICD-10-CM | POA: Diagnosis not present

## 2016-05-18 DIAGNOSIS — N185 Chronic kidney disease, stage 5: Secondary | ICD-10-CM | POA: Diagnosis not present

## 2016-05-18 DIAGNOSIS — R339 Retention of urine, unspecified: Secondary | ICD-10-CM | POA: Diagnosis not present

## 2016-05-18 DIAGNOSIS — M461 Sacroiliitis, not elsewhere classified: Secondary | ICD-10-CM | POA: Diagnosis not present

## 2016-05-18 DIAGNOSIS — Z299 Encounter for prophylactic measures, unspecified: Secondary | ICD-10-CM | POA: Diagnosis not present

## 2016-05-18 DIAGNOSIS — N186 End stage renal disease: Secondary | ICD-10-CM | POA: Diagnosis not present

## 2016-05-19 DIAGNOSIS — D631 Anemia in chronic kidney disease: Secondary | ICD-10-CM | POA: Diagnosis not present

## 2016-05-19 DIAGNOSIS — N186 End stage renal disease: Secondary | ICD-10-CM | POA: Diagnosis not present

## 2016-05-19 DIAGNOSIS — Z992 Dependence on renal dialysis: Secondary | ICD-10-CM | POA: Diagnosis not present

## 2016-05-19 DIAGNOSIS — D509 Iron deficiency anemia, unspecified: Secondary | ICD-10-CM | POA: Diagnosis not present

## 2016-05-19 DIAGNOSIS — R350 Frequency of micturition: Secondary | ICD-10-CM | POA: Diagnosis not present

## 2016-05-21 DIAGNOSIS — S0101XA Laceration without foreign body of scalp, initial encounter: Secondary | ICD-10-CM | POA: Diagnosis not present

## 2016-05-21 DIAGNOSIS — S0990XA Unspecified injury of head, initial encounter: Secondary | ICD-10-CM | POA: Diagnosis not present

## 2016-05-21 DIAGNOSIS — S299XXA Unspecified injury of thorax, initial encounter: Secondary | ICD-10-CM | POA: Diagnosis not present

## 2016-05-21 DIAGNOSIS — Z992 Dependence on renal dialysis: Secondary | ICD-10-CM | POA: Diagnosis not present

## 2016-05-21 DIAGNOSIS — S8991XA Unspecified injury of right lower leg, initial encounter: Secondary | ICD-10-CM | POA: Diagnosis not present

## 2016-05-21 DIAGNOSIS — D72828 Other elevated white blood cell count: Secondary | ICD-10-CM | POA: Diagnosis not present

## 2016-05-21 DIAGNOSIS — Z950 Presence of cardiac pacemaker: Secondary | ICD-10-CM | POA: Diagnosis not present

## 2016-05-21 DIAGNOSIS — I447 Left bundle-branch block, unspecified: Secondary | ICD-10-CM | POA: Diagnosis not present

## 2016-05-21 DIAGNOSIS — I120 Hypertensive chronic kidney disease with stage 5 chronic kidney disease or end stage renal disease: Secondary | ICD-10-CM | POA: Diagnosis not present

## 2016-05-21 DIAGNOSIS — Z87440 Personal history of urinary (tract) infections: Secondary | ICD-10-CM | POA: Diagnosis not present

## 2016-05-21 DIAGNOSIS — R7989 Other specified abnormal findings of blood chemistry: Secondary | ICD-10-CM | POA: Diagnosis not present

## 2016-05-21 DIAGNOSIS — Z8249 Family history of ischemic heart disease and other diseases of the circulatory system: Secondary | ICD-10-CM | POA: Diagnosis not present

## 2016-05-21 DIAGNOSIS — W1839XA Other fall on same level, initial encounter: Secondary | ICD-10-CM | POA: Diagnosis not present

## 2016-05-21 DIAGNOSIS — Z23 Encounter for immunization: Secondary | ICD-10-CM | POA: Diagnosis not present

## 2016-05-21 DIAGNOSIS — N186 End stage renal disease: Secondary | ICD-10-CM | POA: Diagnosis not present

## 2016-05-21 DIAGNOSIS — Z792 Long term (current) use of antibiotics: Secondary | ICD-10-CM | POA: Diagnosis not present

## 2016-05-21 DIAGNOSIS — M25561 Pain in right knee: Secondary | ICD-10-CM | POA: Diagnosis not present

## 2016-05-22 DIAGNOSIS — Z992 Dependence on renal dialysis: Secondary | ICD-10-CM | POA: Diagnosis not present

## 2016-05-22 DIAGNOSIS — D631 Anemia in chronic kidney disease: Secondary | ICD-10-CM | POA: Diagnosis not present

## 2016-05-22 DIAGNOSIS — D509 Iron deficiency anemia, unspecified: Secondary | ICD-10-CM | POA: Diagnosis not present

## 2016-05-22 DIAGNOSIS — N186 End stage renal disease: Secondary | ICD-10-CM | POA: Diagnosis not present

## 2016-05-23 DIAGNOSIS — Z992 Dependence on renal dialysis: Secondary | ICD-10-CM | POA: Diagnosis not present

## 2016-05-23 DIAGNOSIS — D631 Anemia in chronic kidney disease: Secondary | ICD-10-CM | POA: Diagnosis not present

## 2016-05-23 DIAGNOSIS — D509 Iron deficiency anemia, unspecified: Secondary | ICD-10-CM | POA: Diagnosis not present

## 2016-05-23 DIAGNOSIS — N186 End stage renal disease: Secondary | ICD-10-CM | POA: Diagnosis not present

## 2016-05-26 DIAGNOSIS — D631 Anemia in chronic kidney disease: Secondary | ICD-10-CM | POA: Diagnosis not present

## 2016-05-26 DIAGNOSIS — Z992 Dependence on renal dialysis: Secondary | ICD-10-CM | POA: Diagnosis not present

## 2016-05-26 DIAGNOSIS — N186 End stage renal disease: Secondary | ICD-10-CM | POA: Diagnosis not present

## 2016-05-26 DIAGNOSIS — D509 Iron deficiency anemia, unspecified: Secondary | ICD-10-CM | POA: Diagnosis not present

## 2016-05-28 DIAGNOSIS — D631 Anemia in chronic kidney disease: Secondary | ICD-10-CM | POA: Diagnosis not present

## 2016-05-28 DIAGNOSIS — N186 End stage renal disease: Secondary | ICD-10-CM | POA: Diagnosis not present

## 2016-05-28 DIAGNOSIS — Z992 Dependence on renal dialysis: Secondary | ICD-10-CM | POA: Diagnosis not present

## 2016-05-28 DIAGNOSIS — D509 Iron deficiency anemia, unspecified: Secondary | ICD-10-CM | POA: Diagnosis not present

## 2016-05-29 DIAGNOSIS — R338 Other retention of urine: Secondary | ICD-10-CM | POA: Diagnosis not present

## 2016-05-30 DIAGNOSIS — D509 Iron deficiency anemia, unspecified: Secondary | ICD-10-CM | POA: Diagnosis not present

## 2016-05-30 DIAGNOSIS — N186 End stage renal disease: Secondary | ICD-10-CM | POA: Diagnosis not present

## 2016-05-30 DIAGNOSIS — D631 Anemia in chronic kidney disease: Secondary | ICD-10-CM | POA: Diagnosis not present

## 2016-05-30 DIAGNOSIS — Z992 Dependence on renal dialysis: Secondary | ICD-10-CM | POA: Diagnosis not present

## 2016-06-02 DIAGNOSIS — Z992 Dependence on renal dialysis: Secondary | ICD-10-CM | POA: Diagnosis not present

## 2016-06-02 DIAGNOSIS — N186 End stage renal disease: Secondary | ICD-10-CM | POA: Diagnosis not present

## 2016-06-02 DIAGNOSIS — D631 Anemia in chronic kidney disease: Secondary | ICD-10-CM | POA: Diagnosis not present

## 2016-06-02 DIAGNOSIS — D509 Iron deficiency anemia, unspecified: Secondary | ICD-10-CM | POA: Diagnosis not present

## 2016-06-04 DIAGNOSIS — D509 Iron deficiency anemia, unspecified: Secondary | ICD-10-CM | POA: Diagnosis not present

## 2016-06-04 DIAGNOSIS — Z992 Dependence on renal dialysis: Secondary | ICD-10-CM | POA: Diagnosis not present

## 2016-06-04 DIAGNOSIS — D631 Anemia in chronic kidney disease: Secondary | ICD-10-CM | POA: Diagnosis not present

## 2016-06-04 DIAGNOSIS — N186 End stage renal disease: Secondary | ICD-10-CM | POA: Diagnosis not present

## 2016-06-06 DIAGNOSIS — D631 Anemia in chronic kidney disease: Secondary | ICD-10-CM | POA: Diagnosis not present

## 2016-06-06 DIAGNOSIS — N186 End stage renal disease: Secondary | ICD-10-CM | POA: Diagnosis not present

## 2016-06-06 DIAGNOSIS — Z992 Dependence on renal dialysis: Secondary | ICD-10-CM | POA: Diagnosis not present

## 2016-06-06 DIAGNOSIS — D509 Iron deficiency anemia, unspecified: Secondary | ICD-10-CM | POA: Diagnosis not present

## 2016-06-09 DIAGNOSIS — D509 Iron deficiency anemia, unspecified: Secondary | ICD-10-CM | POA: Diagnosis not present

## 2016-06-09 DIAGNOSIS — D631 Anemia in chronic kidney disease: Secondary | ICD-10-CM | POA: Diagnosis not present

## 2016-06-09 DIAGNOSIS — N186 End stage renal disease: Secondary | ICD-10-CM | POA: Diagnosis not present

## 2016-06-09 DIAGNOSIS — Z992 Dependence on renal dialysis: Secondary | ICD-10-CM | POA: Diagnosis not present

## 2016-06-11 ENCOUNTER — Other Ambulatory Visit: Payer: Self-pay

## 2016-06-11 DIAGNOSIS — D631 Anemia in chronic kidney disease: Secondary | ICD-10-CM | POA: Diagnosis not present

## 2016-06-11 DIAGNOSIS — Z992 Dependence on renal dialysis: Secondary | ICD-10-CM | POA: Diagnosis not present

## 2016-06-11 DIAGNOSIS — N186 End stage renal disease: Secondary | ICD-10-CM | POA: Diagnosis not present

## 2016-06-11 DIAGNOSIS — D509 Iron deficiency anemia, unspecified: Secondary | ICD-10-CM | POA: Diagnosis not present

## 2016-06-11 DIAGNOSIS — R202 Paresthesia of skin: Secondary | ICD-10-CM

## 2016-06-12 ENCOUNTER — Ambulatory Visit (HOSPITAL_COMMUNITY)
Admission: RE | Admit: 2016-06-12 | Discharge: 2016-06-12 | Disposition: A | Discharge status: Home / Self Care | Payer: Medicare Other | Source: Ambulatory Visit | Attending: Vascular Surgery | Admitting: Vascular Surgery

## 2016-06-12 DIAGNOSIS — R202 Paresthesia of skin: Secondary | ICD-10-CM

## 2016-06-13 DIAGNOSIS — Z992 Dependence on renal dialysis: Secondary | ICD-10-CM | POA: Diagnosis not present

## 2016-06-13 DIAGNOSIS — D509 Iron deficiency anemia, unspecified: Secondary | ICD-10-CM | POA: Diagnosis not present

## 2016-06-13 DIAGNOSIS — D631 Anemia in chronic kidney disease: Secondary | ICD-10-CM | POA: Diagnosis not present

## 2016-06-13 DIAGNOSIS — N186 End stage renal disease: Secondary | ICD-10-CM | POA: Diagnosis not present

## 2016-06-16 DIAGNOSIS — Z992 Dependence on renal dialysis: Secondary | ICD-10-CM | POA: Diagnosis not present

## 2016-06-16 DIAGNOSIS — D509 Iron deficiency anemia, unspecified: Secondary | ICD-10-CM | POA: Diagnosis not present

## 2016-06-16 DIAGNOSIS — D631 Anemia in chronic kidney disease: Secondary | ICD-10-CM | POA: Diagnosis not present

## 2016-06-16 DIAGNOSIS — N186 End stage renal disease: Secondary | ICD-10-CM | POA: Diagnosis not present

## 2016-06-17 ENCOUNTER — Ambulatory Visit (INDEPENDENT_AMBULATORY_CARE_PROVIDER_SITE_OTHER): Payer: Medicare Other | Admitting: Vascular Surgery

## 2016-06-17 ENCOUNTER — Encounter: Payer: Self-pay | Admitting: Vascular Surgery

## 2016-06-17 VITALS — BP 140/63 | HR 64 | Temp 97.4°F | Resp 20 | Ht 70.0 in | Wt 172.0 lb

## 2016-06-17 DIAGNOSIS — I2510 Atherosclerotic heart disease of native coronary artery without angina pectoris: Secondary | ICD-10-CM

## 2016-06-17 DIAGNOSIS — N186 End stage renal disease: Secondary | ICD-10-CM

## 2016-06-17 DIAGNOSIS — Z992 Dependence on renal dialysis: Secondary | ICD-10-CM

## 2016-06-17 NOTE — Progress Notes (Signed)
Vascular and Vein Specialist of [place]  Patient name: Dale Torres MRN: [id number] DOB: 03/20/1925 Sex: male  REASON FOR VISIT: Evaluate stenosis steal syndrome left hand  HPI: Dale Torres is a 83 y.o. male seen today for evaluation of steal syndrome left hand. He is a very pleasant 83 year old gentleman is here today with his son. He initially had the a fistula creation left upper arm with Dr. Scot Dock in 2016. He had not been initiated on hemodialysis at that time. He then went on to hemodialysis and had thrombosis. The thickened the operating room in February 2017 and revised this with a replacement of the upper portion of his fistula with the prosthetic graft into his axillary vein. He has done well with access since that time. He reports that he has had a progressive changes of pain in his hand while on dialysis. This is become to the point where he is not able to tolerate this and has had to stop dialysis on several occasions. He underwent noninvasive studies in our office last week and this did show steal. He is right-handed. He has never had hemodialysis catheter and has never had a right arm access.  Past Medical History:  Diagnosis Date  . Anemia    Dr. Sonny Dandy  . Arthritis   . Benign prostatic hypertrophy   . Carpal tunnel syndrome of left wrist   . Coronary atherosclerosis of native coronary artery    Nonobstructive 2009  . ESRD on hemodialysis ([place])   . Essential hypertension, benign   . GERD (gastroesophageal reflux disease)   . Headache   . History of pneumonia   . HOH (hard of hearing)    Bilateral  hearing aids  . Hypothyroidism   . Persistent atrial fibrillation (HCC)    Declines coumadin  . Presence of permanent cardiac pacemaker   . Sick sinus syndrome [facility])    Status post pacemaker placement 2008  . Skin cancer, basal cell     Family History  Problem Relation Age of Onset  . Coronary artery disease Father   . Heart  disease Father        after age 43  . Coronary artery disease Mother   . Heart disease Mother        After age 31  . Coronary artery disease Sister   . Heart disease Sister        After age 22    SOCIAL HISTORY: Social History  Substance Use Topics  . Smoking status: Former Smoker    Packs/day: 0.80    Years: 12.00    Types: Cigarettes    Start date: 01/20/1948    Quit date: 01/20/1960  . Smokeless tobacco: Former Systems developer    Types: Chew    Quit date: 01/20/1980     Comment: chewed tobacco x's 10 years while playing golf only,used a pack per week  . Alcohol use 0.0 oz/week     Comment: Occasional glass of wine    Allergies  Allergen Reactions  . Oxycodone Hcl Rash    Current Outpatient Prescriptions  Medication Sig Dispense Refill  . amiodarone (PACERONE) 200 MG tablet Take 200 mg by mouth daily.    Marland Kitchen aspirin 81 MG tablet Take 81 mg by mouth daily.      Marland Kitchen atenolol (TENORMIN) 25 MG tablet Take 12.5 mg by mouth daily.    Marland Kitchen docusate sodium (COLACE) 100 MG capsule Take 100 mg by mouth as needed for constipation.    Marland Kitchen  fluticasone furoate-vilanterol (BREO ELLIPTA) 200-25 MCG/INH AEPB Inhale 1 puff into the lungs daily.    . folic acid (FOLVITE) 1 MG tablet Take 1 mg by mouth daily.      Marland Kitchen gabapentin (NEURONTIN) 400 MG capsule Take 400 mg by mouth daily.      Marland Kitchen HYDROcodone-acetaminophen (NORCO) 10-325 MG tablet Take 1 tablet by mouth every 6 (six) hours as needed for severe pain. 30 tablet 0  . levothyroxine (SYNTHROID, LEVOTHROID) 112 MCG tablet Take 112 mcg by mouth daily.    . midodrine (PROAMATINE) 10 MG tablet Take 1 tablet by mouth 2 (two) times daily.    . nitroGLYCERIN (NITROSTAT) 0.4 MG SL tablet Place 1 tablet (0.4 mg total) under the tongue every 5 (five) minutes as needed. (Patient taking differently: Place 0.4 mg under the tongue every 5 (five) minutes as needed for chest pain. ) 25 tablet 2  . omeprazole (PRILOSEC) 20 MG capsule Take 20 mg by mouth daily.      . sevelamer  carbonate (RENVELA) 800 MG tablet Take 800 mg by mouth 3 (three) times daily with meals.     No current facility-administered medications for this visit.     REVIEW OF SYSTEMS:  [X]  denotes positive finding, [ ]  denotes negative finding Cardiac  Comments:  Chest pain or chest pressure:    Shortness of breath upon exertion: x   Short of breath when lying flat:    Irregular heart rhythm: x       Vascular    Pain in calf, thigh, or hip brought on by ambulation: x   Pain in feet at night that wakes you up from your sleep:     Blood clot in your veins: x   Leg swelling:  x         PHYSICAL EXAM: Vitals:   06/17/16 0948  BP: 140/63  Pulse: 64  Resp: 20  Temp: 97.4 F (36.3 C)  TempSrc: Oral  SpO2: 98%  Weight: 172 lb (78 kg)  Height: 5\' 10"  (1.778 m)    GENERAL: The patient is a well-nourished male, in no acute distress. The vital signs are documented above. CARDIOVASCULAR: Well-developed left upper arm fistula with excellent thrill. I do not palpate a radial pulse with his fistula patent. On compression of his fistula he has a 2+ radial pulse. On the right he does have a 2+ radial pulse and a normal caliber cephalic vein throughout its course in the forearm extending into the upper arm PULMONARY: There is good air exchange  MUSCULOSKELETAL: There are no major deformities or cyanosis. NEUROLOGIC: No focal weakness or paresthesias are detected. SKIN: There are no ulcers or rashes noted. PSYCHIATRIC: The patient has a normal affect.  DATA:  Noninvasive studies from last week demonstrate significant increase in his pressure on the left hand with compression of the graft indicating steal  MEDICAL ISSUES: Had long discussion with the patient and his son present. Explained the potential for narrowing the fistula but explained my experience this is been very unsatisfactory. Explained that it high likelihood that either it will not provide adequate restriction for relief of the steal  or will cause thrombosis. I have recommended ligation of this fistula for immediate relief of his pain. Have also recommended hemodialysis catheter and right radiocephalic fistula on the same setting. Pain that he would use a catheter for approximately 3 months and then began using the fistula [place] has had inadequate maturation. Also explained that there is a that the  fistula may not mature would require additional procedures in the right arm to obtain successful hemodialysis in the right arm. He has hemodialysis on Tuesday, Thursday and Saturday. Wishes to proceed as soon as possible as an outpatient    Rosetta Posner, MD [facility] Special[facility] Tel [phone] Pager [phone]

## 2016-06-18 ENCOUNTER — Other Ambulatory Visit: Payer: Self-pay

## 2016-06-18 DIAGNOSIS — N186 End stage renal disease: Secondary | ICD-10-CM | POA: Diagnosis not present

## 2016-06-18 DIAGNOSIS — D631 Anemia in chronic kidney disease: Secondary | ICD-10-CM | POA: Diagnosis not present

## 2016-06-18 DIAGNOSIS — Z992 Dependence on renal dialysis: Secondary | ICD-10-CM | POA: Diagnosis not present

## 2016-06-18 DIAGNOSIS — D509 Iron deficiency anemia, unspecified: Secondary | ICD-10-CM | POA: Diagnosis not present

## 2016-06-20 DIAGNOSIS — N2581 Secondary hyperparathyroidism of renal origin: Secondary | ICD-10-CM | POA: Diagnosis not present

## 2016-06-20 DIAGNOSIS — D509 Iron deficiency anemia, unspecified: Secondary | ICD-10-CM | POA: Diagnosis not present

## 2016-06-20 DIAGNOSIS — N186 End stage renal disease: Secondary | ICD-10-CM | POA: Diagnosis not present

## 2016-06-20 DIAGNOSIS — D631 Anemia in chronic kidney disease: Secondary | ICD-10-CM | POA: Diagnosis not present

## 2016-06-20 DIAGNOSIS — Z992 Dependence on renal dialysis: Secondary | ICD-10-CM | POA: Diagnosis not present

## 2016-06-23 DIAGNOSIS — Z992 Dependence on renal dialysis: Secondary | ICD-10-CM | POA: Diagnosis not present

## 2016-06-23 DIAGNOSIS — N2581 Secondary hyperparathyroidism of renal origin: Secondary | ICD-10-CM | POA: Diagnosis not present

## 2016-06-23 DIAGNOSIS — N186 End stage renal disease: Secondary | ICD-10-CM | POA: Diagnosis not present

## 2016-06-23 DIAGNOSIS — D631 Anemia in chronic kidney disease: Secondary | ICD-10-CM | POA: Diagnosis not present

## 2016-06-23 DIAGNOSIS — D509 Iron deficiency anemia, unspecified: Secondary | ICD-10-CM | POA: Diagnosis not present

## 2016-06-23 NOTE — Progress Notes (Addendum)
Anesthesia Chart Review:  Pt is a same day work up  Pt is an 83 year old male scheduled for ligation of L upper arm AV fistula, insertion of dialysis catheter, R lower arm radiocephalic AV fistula creation on 06/24/2016 Curt Jews, MD  - PCP is Monico Blitz, MD - Cardiologist is Rozann Lesches, MD, last office visit 02/26/16 - EP cardiologist is Thompson Grayer, MD, last office visit 05/01/16  PMH includes:  CAD, persistent atrial fibrillation (pt refuses anticoagulation), sick sinus syndrome, pacemaker ([employer], implanted 03/03/11), HTN, ESRD on hemodialysis, hypothyroidism, GERD. Former smoker. BMI 25. S/p thrombectomy, AV graft revision 03/11/15.  S/p AV fistula creation 08/16/14.   Medications include: Amiodarone, ASA 81 mg, atenolol, levothyroxine, midodrine, Prilosec  Labs will be obtained DOS.   EKG 05/01/16: Electronic atrial pacemaker. LAD. LBBB.  Holter monitor 01/31/15: Rhythm is atrial fibrillation with intermittent ventricular pacing evident and IVCD at baseline. Heart rate ranged from 56 bpm up to 120 bpm with an average of 73 bpm. There were no pauses.  Echo 06/29/12: - Left ventricle: The cavity size was normal. There was mild concentric hypertrophy. Systolic function was mildly tomoderately reduced. The estimated ejection fraction was inthe range of 40% to 45%. There is hypokinesis of thebasal-midinferior myocardium. There is hypokinesis of themid-distalanteroseptal myocardium. Doppler parameters areconsistent with abnormal left ventricular relaxation(grade 1 diastolic dysfunction). - Aortic valve: Trileaflet; mildly thickened leaflets. Trivial regurgitation. Mean gradient: 36mm Hg (S). Valve area: 2.67cm^2(VTI). - Mitral valve: Mild regurgitation. - Left atrium: The atrium was moderately dilated. - Right ventricle: Pacer wire or catheter noted in right ventricle. - Right atrium: Central venous pressure: 47mm Hg (est). - Tricuspid valve: Mild regurgitation. - Pulmonic valve: Mild  regurgitation. - Pulmonary arteries: PA peak pressure: 65mm Hg (S). - Pericardium, extracardiac: There was no pericardial effusion.  Nuclear stress test 06/27/12:  - Mild to moderate sized, mild to moderate intensity inferior/inferior septal defect, somewhat variable but predominantly fixed. Could be consistent with ventricular pacing, however cannot exclude scar with minor.-Infarct ischemia. Post-stress LVEF calculated at 43%. Low risk study.  If labs acceptable DOS, I anticipate patient can proceed as scheduled  Willeen Cass, FNP-BC [facility]/Anesthesiology Phone: [phone] 06/23/2016 10:16 AM

## 2016-06-23 NOTE — Progress Notes (Addendum)
Attempted to contact patient x 2 without success. Left message on VM for patient to arrive at 1145. NPO after midnight. Take Amiodarone, Atenolol, Levothyroxine, Midodrine, Omeprazole, and Hydrocodone (PRN). That he would need a driver and someone to be with him for 24 hours after surgery.   Update: Spoke to daughter on phone who verbalized they had received instructions.

## 2016-06-24 ENCOUNTER — Ambulatory Visit (HOSPITAL_COMMUNITY): Payer: Medicare Other

## 2016-06-24 ENCOUNTER — Encounter (HOSPITAL_COMMUNITY): Payer: Self-pay | Admitting: *Deleted

## 2016-06-24 ENCOUNTER — Encounter (HOSPITAL_COMMUNITY)
Admission: RE | Disposition: A | Discharge status: Home / Self Care | Payer: Self-pay | Source: Ambulatory Visit | Attending: Vascular Surgery

## 2016-06-24 ENCOUNTER — Ambulatory Visit (HOSPITAL_COMMUNITY)
Admission: RE | Admit: 2016-06-24 | Discharge: 2016-06-24 | Disposition: A | Discharge status: Home / Self Care | Payer: Medicare Other | Source: Ambulatory Visit | Attending: Vascular Surgery | Admitting: Vascular Surgery

## 2016-06-24 ENCOUNTER — Ambulatory Visit (HOSPITAL_COMMUNITY): Payer: Medicare Other | Admitting: Emergency Medicine

## 2016-06-24 DIAGNOSIS — N400 Enlarged prostate without lower urinary tract symptoms: Secondary | ICD-10-CM | POA: Insufficient documentation

## 2016-06-24 DIAGNOSIS — T82898A Other specified complication of vascular prosthetic devices, implants and grafts, initial encounter: Secondary | ICD-10-CM | POA: Diagnosis not present

## 2016-06-24 DIAGNOSIS — I2510 Atherosclerotic heart disease of native coronary artery without angina pectoris: Secondary | ICD-10-CM | POA: Insufficient documentation

## 2016-06-24 DIAGNOSIS — I4891 Unspecified atrial fibrillation: Secondary | ICD-10-CM | POA: Diagnosis not present

## 2016-06-24 DIAGNOSIS — E039 Hypothyroidism, unspecified: Secondary | ICD-10-CM | POA: Diagnosis not present

## 2016-06-24 DIAGNOSIS — Z992 Dependence on renal dialysis: Secondary | ICD-10-CM | POA: Diagnosis not present

## 2016-06-24 DIAGNOSIS — D631 Anemia in chronic kidney disease: Secondary | ICD-10-CM | POA: Insufficient documentation

## 2016-06-24 DIAGNOSIS — Z7982 Long term (current) use of aspirin: Secondary | ICD-10-CM | POA: Insufficient documentation

## 2016-06-24 DIAGNOSIS — Y832 Surgical operation with anastomosis, bypass or graft as the cause of abnormal reaction of the patient, or of later complication, without mention of misadventure at the time of the procedure: Secondary | ICD-10-CM | POA: Insufficient documentation

## 2016-06-24 DIAGNOSIS — I481 Persistent atrial fibrillation: Secondary | ICD-10-CM | POA: Diagnosis not present

## 2016-06-24 DIAGNOSIS — Z4682 Encounter for fitting and adjustment of non-vascular catheter: Secondary | ICD-10-CM | POA: Diagnosis not present

## 2016-06-24 DIAGNOSIS — M1990 Unspecified osteoarthritis, unspecified site: Secondary | ICD-10-CM | POA: Diagnosis not present

## 2016-06-24 DIAGNOSIS — Z7951 Long term (current) use of inhaled steroids: Secondary | ICD-10-CM | POA: Diagnosis not present

## 2016-06-24 DIAGNOSIS — I120 Hypertensive chronic kidney disease with stage 5 chronic kidney disease or end stage renal disease: Secondary | ICD-10-CM | POA: Diagnosis not present

## 2016-06-24 DIAGNOSIS — N186 End stage renal disease: Secondary | ICD-10-CM | POA: Insufficient documentation

## 2016-06-24 DIAGNOSIS — Z950 Presence of cardiac pacemaker: Secondary | ICD-10-CM | POA: Insufficient documentation

## 2016-06-24 DIAGNOSIS — Z87891 Personal history of nicotine dependence: Secondary | ICD-10-CM | POA: Diagnosis not present

## 2016-06-24 DIAGNOSIS — Z419 Encounter for procedure for purposes other than remedying health state, unspecified: Secondary | ICD-10-CM

## 2016-06-24 DIAGNOSIS — K219 Gastro-esophageal reflux disease without esophagitis: Secondary | ICD-10-CM | POA: Insufficient documentation

## 2016-06-24 DIAGNOSIS — Z95828 Presence of other vascular implants and grafts: Secondary | ICD-10-CM

## 2016-06-24 DIAGNOSIS — T82510A Breakdown (mechanical) of surgically created arteriovenous fistula, initial encounter: Secondary | ICD-10-CM | POA: Diagnosis not present

## 2016-06-24 DIAGNOSIS — N185 Chronic kidney disease, stage 5: Secondary | ICD-10-CM | POA: Diagnosis not present

## 2016-06-24 HISTORY — PX: LIGATION OF ARTERIOVENOUS  FISTULA: SHX5948

## 2016-06-24 HISTORY — PX: INSERTION OF DIALYSIS CATHETER: SHX1324

## 2016-06-24 HISTORY — PX: AV FISTULA PLACEMENT: SHX1204

## 2016-06-24 LAB — POCT I-STAT 4, (NA,K, GLUC, HGB,HCT)
GLUCOSE: 88 mg/dL (ref 65–99)
HCT: 32 % — ABNORMAL LOW (ref 39.0–52.0)
HEMOGLOBIN: 10.9 g/dL — AB (ref 13.0–17.0)
POTASSIUM: 4.6 mmol/L (ref 3.5–5.1)
Sodium: 135 mmol/L (ref 135–145)

## 2016-06-24 SURGERY — LIGATION OF ARTERIOVENOUS  FISTULA
Anesthesia: Monitor Anesthesia Care | Site: Arm Upper | Laterality: Right

## 2016-06-24 MED ORDER — PROPOFOL 1000 MG/100ML IV EMUL
INTRAVENOUS | Status: AC
  Filled 2016-06-24: qty 100

## 2016-06-24 MED ORDER — SODIUM CHLORIDE 0.9 % IV SOLN
INTRAVENOUS | Status: DC | PRN
  Administered 2016-06-24: 14:00:00

## 2016-06-24 MED ORDER — IOPAMIDOL (ISOVUE-300) INJECTION 61%
INTRAVENOUS | Status: AC
  Filled 2016-06-24: qty 50

## 2016-06-24 MED ORDER — SODIUM CHLORIDE 0.9 % IV SOLN: INTRAVENOUS | Status: DC

## 2016-06-24 MED ORDER — PROPOFOL 500 MG/50ML IV EMUL
INTRAVENOUS | Status: DC | PRN
  Administered 2016-06-24: 75 ug/kg/min via INTRAVENOUS

## 2016-06-24 MED ORDER — LIDOCAINE-EPINEPHRINE (PF) 1 %-1:200000 IJ SOLN
INTRAMUSCULAR | Status: AC
  Filled 2016-06-24: qty 30

## 2016-06-24 MED ORDER — PHENYLEPHRINE HCL 10 MG/ML IJ SOLN
INTRAVENOUS | Status: DC | PRN
  Administered 2016-06-24: 35 ug/min via INTRAVENOUS

## 2016-06-24 MED ORDER — HEPARIN SODIUM (PORCINE) 1000 UNIT/ML IJ SOLN
INTRAMUSCULAR | Status: AC
  Filled 2016-06-24: qty 1

## 2016-06-24 MED ORDER — DEXTROSE 5 % IV SOLN
INTRAVENOUS | Status: AC
  Filled 2016-06-24: qty 1.5

## 2016-06-24 MED ORDER — HEPARIN SODIUM (PORCINE) 1000 UNIT/ML IJ SOLN
INTRAMUSCULAR | Status: DC | PRN
  Administered 2016-06-24: 1000 [IU]

## 2016-06-24 MED ORDER — HYDROCODONE-ACETAMINOPHEN 10-325 MG PO TABS: 1.0000 | ORAL_TABLET | Freq: Four times a day (QID) | ORAL | 0 refills | Status: AC | PRN

## 2016-06-24 MED ORDER — 0.9 % SODIUM CHLORIDE (POUR BTL) OPTIME
TOPICAL | Status: DC | PRN
  Administered 2016-06-24: 1000 mL

## 2016-06-24 MED ORDER — SODIUM CHLORIDE 0.9 % IV SOLN
INTRAVENOUS | Status: DC | PRN
  Administered 2016-06-24: 12:00:00 via INTRAVENOUS

## 2016-06-24 MED ORDER — CHLORHEXIDINE GLUCONATE CLOTH 2 % EX PADS: 6.0000 | MEDICATED_PAD | Freq: Once | CUTANEOUS | Status: DC

## 2016-06-24 MED ORDER — LIDOCAINE-EPINEPHRINE (PF) 1 %-1:200000 IJ SOLN
INTRAMUSCULAR | Status: DC | PRN
  Administered 2016-06-24: 12 mL

## 2016-06-24 MED ORDER — DEXTROSE 5 % IV SOLN
1.5000 g | INTRAVENOUS | Status: AC
  Administered 2016-06-24: 1.5 g via INTRAVENOUS

## 2016-06-24 MED ORDER — FENTANYL CITRATE (PF) 250 MCG/5ML IJ SOLN
INTRAMUSCULAR | Status: AC
  Filled 2016-06-24: qty 5

## 2016-06-24 MED ORDER — FENTANYL CITRATE (PF) 100 MCG/2ML IJ SOLN: 25.0000 ug | INTRAMUSCULAR | Status: DC | PRN

## 2016-06-24 MED ORDER — FENTANYL CITRATE (PF) 100 MCG/2ML IJ SOLN
INTRAMUSCULAR | Status: DC | PRN
  Administered 2016-06-24: 50 ug via INTRAVENOUS

## 2016-06-24 MED ORDER — EPHEDRINE SULFATE 50 MG/ML IJ SOLN
INTRAMUSCULAR | Status: DC | PRN
  Administered 2016-06-24: 10 mg via INTRAVENOUS

## 2016-06-24 MED ORDER — LIDOCAINE 2% (20 MG/ML) 5 ML SYRINGE
INTRAMUSCULAR | Status: AC
  Filled 2016-06-24: qty 5

## 2016-06-24 MED ORDER — EPHEDRINE 5 MG/ML INJ
INTRAVENOUS | Status: AC
  Filled 2016-06-24: qty 10

## 2016-06-24 MED ORDER — PROPOFOL 10 MG/ML IV BOLUS
INTRAVENOUS | Status: DC | PRN
  Administered 2016-06-24: 20 mg via INTRAVENOUS

## 2016-06-24 SURGICAL SUPPLY — 58 items
ADH SKN CLS APL DERMABOND .7 (GAUZE/BANDAGES/DRESSINGS) ×3
ADH SKN CLS LQ APL DERMABOND (GAUZE/BANDAGES/DRESSINGS) ×9
ARMBAND PINK RESTRICT EXTREMIT (MISCELLANEOUS) ×8 IMPLANT
BAG DECANTER FOR FLEXI CONT (MISCELLANEOUS) ×4 IMPLANT
BIOPATCH RED 1 DISK 7.0 (GAUZE/BANDAGES/DRESSINGS) ×4 IMPLANT
CANISTER SUCT 3000ML PPV (MISCELLANEOUS) ×4 IMPLANT
CANNULA VESSEL 3MM 2 BLNT TIP (CANNULA) ×4 IMPLANT
CATH PALINDROME RT-P 15FX28CM (CATHETERS) ×1 IMPLANT
CLIP LIGATING EXTRA MED SLVR (CLIP) ×4 IMPLANT
CLIP LIGATING EXTRA SM BLUE (MISCELLANEOUS) ×4 IMPLANT
COVER PROBE W GEL 5X96 (DRAPES) ×4 IMPLANT
COVER SURGICAL LIGHT HANDLE (MISCELLANEOUS) ×4 IMPLANT
DECANTER SPIKE VIAL GLASS SM (MISCELLANEOUS) ×4 IMPLANT
DERMABOND ADHESIVE PROPEN (GAUZE/BANDAGES/DRESSINGS) ×3
DERMABOND ADVANCED (GAUZE/BANDAGES/DRESSINGS) ×1
DERMABOND ADVANCED .7 DNX12 (GAUZE/BANDAGES/DRESSINGS) ×3 IMPLANT
DERMABOND ADVANCED .7 DNX6 (GAUZE/BANDAGES/DRESSINGS) IMPLANT
DRAPE C-ARM 42X72 X-RAY (DRAPES) ×4 IMPLANT
DRAPE CHEST BREAST 15X10 FENES (DRAPES) ×4 IMPLANT
DRAPE HALF SHEET 40X57 (DRAPES) ×1 IMPLANT
DRAPE IMP U-DRAPE 54X76 (DRAPES) ×1 IMPLANT
DRAPE ORTHO SPLIT 77X108 STRL (DRAPES) ×4
DRAPE SURG ORHT 6 SPLT 77X108 (DRAPES) IMPLANT
ELECT REM PT RETURN 9FT ADLT (ELECTROSURGICAL) ×4
ELECTRODE REM PT RTRN 9FT ADLT (ELECTROSURGICAL) ×3 IMPLANT
GLOVE BIO SURGEON STRL SZ 6.5 (GLOVE) ×3 IMPLANT
GLOVE BIOGEL PI IND STRL 6.5 (GLOVE) IMPLANT
GLOVE BIOGEL PI IND STRL 7.0 (GLOVE) IMPLANT
GLOVE BIOGEL PI INDICATOR 6.5 (GLOVE) ×4
GLOVE BIOGEL PI INDICATOR 7.0 (GLOVE) ×2
GLOVE SS BIOGEL STRL SZ 7.5 (GLOVE) ×3 IMPLANT
GLOVE SUPERSENSE BIOGEL SZ 7.5 (GLOVE) ×3
GLOVE SURG SS PI 7.0 STRL IVOR (GLOVE) ×1 IMPLANT
GOWN STRL REUS W/ TWL LRG LVL3 (GOWN DISPOSABLE) ×9 IMPLANT
GOWN STRL REUS W/ TWL XL LVL3 (GOWN DISPOSABLE) IMPLANT
GOWN STRL REUS W/TWL LRG LVL3 (GOWN DISPOSABLE) ×20
GOWN STRL REUS W/TWL XL LVL3 (GOWN DISPOSABLE) ×8
KIT BASIN OR (CUSTOM PROCEDURE TRAY) ×4 IMPLANT
KIT ROOM TURNOVER OR (KITS) ×4 IMPLANT
NDL 18GX1X1/2 (RX/OR ONLY) (NEEDLE) ×3 IMPLANT
NEEDLE 18GX1X1/2 (RX/OR ONLY) (NEEDLE) ×4 IMPLANT
NS IRRIG 1000ML POUR BTL (IV SOLUTION) ×4 IMPLANT
PACK CV ACCESS (CUSTOM PROCEDURE TRAY) ×4 IMPLANT
PAD ARMBOARD 7.5X6 YLW CONV (MISCELLANEOUS) ×8 IMPLANT
SOAP 2 % CHG 4 OZ (WOUND CARE) ×4 IMPLANT
STOCKINETTE 6  STRL (DRAPES) ×1
STOCKINETTE 6 STRL (DRAPES) IMPLANT
SUT ETHILON 3 0 PS 1 (SUTURE) ×4 IMPLANT
SUT PROLENE 6 0 CC (SUTURE) ×4 IMPLANT
SUT SILK 0 TIES 10X30 (SUTURE) ×4 IMPLANT
SUT VIC AB 3-0 SH 27 (SUTURE) ×8
SUT VIC AB 3-0 SH 27X BRD (SUTURE) ×3 IMPLANT
SUT VICRYL 4-0 PS2 18IN ABS (SUTURE) ×4 IMPLANT
SYR 10ML LL (SYRINGE) ×4 IMPLANT
SYR 20CC LL (SYRINGE) ×4 IMPLANT
SYR 5ML LL (SYRINGE) ×7 IMPLANT
UNDERPAD 30X30 (UNDERPADS AND DIAPERS) ×4 IMPLANT
WATER STERILE IRR 1000ML POUR (IV SOLUTION) ×4 IMPLANT

## 2016-06-24 NOTE — Interval H&P Note (Signed)
History and Physical Interval Note:  06/24/2016 1:28 PM  Dale Torres  has presented today for surgery, with the diagnosis of End Stage Renal Disease N18.6 Steal Syndrome Left Hand  T82.510  The various methods of treatment have been discussed with the patient and family. After consideration of risks, benefits and other options for treatment, the patient has consented to  Procedure(s): LIGATION OF ARTERIOVENOUS  FISTULA (Left) INSERTION OF DIALYSIS CATHETER (N/A) ARTERIOVENOUS (AV) FISTULA CREATION- RADIOCEPHALIC (Right) as a surgical intervention .  The patient's history has been reviewed, patient examined, no change in status, stable for surgery.  I have reviewed the patient's chart and labs.  Questions were answered to the patient's satisfaction.     Curt Jews

## 2016-06-24 NOTE — Anesthesia Postprocedure Evaluation (Signed)
Anesthesia Post Note  Patient: Dale Torres  Procedure(s) Performed: Procedure(s) (LRB): LIGATION OF left arm  ARTERIOVENOUS  FISTULA (Left) INSERTION OF DIALYSIS CATHETER - left Internal Jugular placement (Left) CREATION OF RIGHT RADIOCEPHALIC ARTERIOVENOUS (AV) (Right)     Patient location during evaluation: PACU Anesthesia Type: MAC Level of consciousness: awake, awake and alert and oriented Pain management: pain level controlled Vital Signs Assessment: post-procedure vital signs reviewed and stable Respiratory status: spontaneous breathing, nonlabored ventilation and respiratory function stable Cardiovascular status: blood pressure returned to baseline Postop Assessment: no headache Anesthetic complications: no    Last Vitals:  Vitals:   06/24/16 1715 06/24/16 1716  BP:  (!) 155/66  Pulse: 61 61  Resp: 12 13  Temp:      Last Pain:  Vitals:   06/24/16 1700  TempSrc:   PainSc: 0-No pain                 Emon Miggins COKER

## 2016-06-24 NOTE — Op Note (Signed)
    OPERATIVE REPORT  DATE OF SURGERY: 06/24/2016  PATIENT: Dale Torres, 83 y.o. male MRN: [id number]  DOB: Dec 06, 1925  PRE-OPERATIVE DIAGNOSIS: End-stage renal disease with steal syndrome left hand  POST-OPERATIVE DIAGNOSIS:  Same  PROCEDURE: #1 ligation of left upper arm AV fistula, #2 left IJ tunneled hemodialysis catheter, #3 right radiocephalic AV fistula creation  SURGEON:  Curt Jews, M.D.  PHYSICIAN ASSISTANT: Silva Bandy PA-C  ANESTHESIA:  Local with sedation  EBL: Minimal ml  No intake/output data recorded.  BLOOD ADMINISTERED: None  DRAINS: None  SPECIMEN: None  COUNTS CORRECT:  YES  PLAN OF CARE: PACU stable with chest x-ray pending   PATIENT DISPOSITION:  PACU - hemodynamically stable  PROCEDURE DETAILS: Patient was taken to the operative placed supine position where the area the right left neck and chest prepped in usual sterile fashion and a SonoSite visualization was used to identify the left internal jugular vein and under local anesthesia the left internal jugular vein was accessed with an 18-gauge needle a guidewire was passed down the level the right atrium. This was confirmed with fluoroscopy. A dilator and peel-away sheath was passed over the guidewire and the dilator and guidewire removed. A 27 cm tunneled catheter was passed through the peel-away sheath which was removed. The catheter tips were positioned the level of the distal right atrium and the catheter was brought through a separate subcutaneous tunnel. The 2 lm ports were attached in both lumens flushed and aspirated easily and were locked with 1000 unit per cc heparin. The catheter was secured to the skin with a 3-0 nylon stitch and the entry site was closed with a 4-0 subcuticular Vicryl stitch. Sterile dressing was applied. Next attention was turned the left arm prepped in usual sterile fashion. Using local anesthesia incision was made in the antecubital space ^ M isolate the cephalic vein just  distal to the brachial artery anastomosis. The vein was doubly ligated with 2-0 silk ties. The wound was irrigated with saline was closed with 3-0 Vicryl the subcutaneous and subcuticular tissue. Finally attention was turned to the right arm. This was prepped and draped in usual sterile fashion. Using local anesthesia stage incision was made between the level of the cephalic vein and radial artery. The cephalic vein branches were ligated with 3 or 4-0 silk ties and divided. The vein was ligated distally and divided and brought into approximation with the radial artery. The radial artery was occluded proximally and distally with Serafin clamps and was opened with an 11 blade incision longitudinally with Potts scissors. The vein was cut to appropriate length and was spatulated and sewn end-to-side to the artery with a running 6-0 Prolene suture. After the usual flushing maneuvers the removed and good thrill was noted. Wounds irrigated with saline. Hemostasis cautery. Wounds were closed with 3-0 Vicryl in the subcutaneous and subcuticular tissue. Sterile dressing was applied and the patient was transferred to the recovery room stable condition   Rosetta Posner, M.D., [facility] 06/24/2016 3:48 PM

## 2016-06-24 NOTE — Transfer of Care (Signed)
Immediate Anesthesia Transfer of Care Note  Patient: Dale Torres  Procedure(s) Performed: Procedure(s): LIGATION OF left arm  ARTERIOVENOUS  FISTULA (Left) INSERTION OF DIALYSIS CATHETER - left Internal Jugular placement (Left) CREATION OF RIGHT RADIOCEPHALIC ARTERIOVENOUS (AV) (Right)  Patient Location: PACU  Anesthesia Type:MAC  Level of Consciousness: awake, alert  and oriented  Airway & Oxygen Therapy: Patient Spontanous Breathing  Post-op Assessment: Report given to RN and Post -op Vital signs reviewed and stable  Post vital signs: Reviewed and stable  Last Vitals:  Vitals:   06/24/16 1213  Pulse: 64  Resp: 20  Temp: 36.5 C    Last Pain:  Vitals:   06/24/16 1213  TempSrc: Oral      Patients Stated Pain Goal: 3 (42/35/36 1443)  Complications: No apparent anesthesia complications

## 2016-06-24 NOTE — Anesthesia Postprocedure Evaluation (Signed)
Anesthesia Post Note  Patient: Dale Torres [facility]  Procedure(s) Performed: Procedure(s) (LRB): LIGATION OF left arm  ARTERIOVENOUS  FISTULA (Left) INSERTION OF DIALYSIS CATHETER - left Internal Jugular placement (Left) CREATION OF RIGHT RADIOCEPHALIC ARTERIOVENOUS (AV) (Right)     Anesthesia Post Evaluation  Last Vitals:  Vitals:   06/24/16 1715 06/24/16 1716  BP:  (!) 155/66  Pulse: 61 61  Resp: 12 13  Temp:      Last Pain:  Vitals:   06/24/16 1700  TempSrc:   PainSc: 0-No pain                 Mushka Laconte COKER

## 2016-06-24 NOTE — H&P (View-Only) (Signed)
[facility] [place]  Patient name: Dale Torres MRN: [id number] DOB: 29-Sep-1925 Sex: male  REASON FOR VISIT: Evaluate stenosis steal syndrome left hand  HPI: Dale Torres is a 83 y.o. male seen today for evaluation of steal syndrome left hand. He is a very pleasant 83 year old gentleman is here today with his son. He initially had the a fistula creation left upper arm with Dr. Scot Dock in 2016. He had not been initiated on hemodialysis at that time. He then went on to hemodialysis and had thrombosis. The thickened the operating room in February 2017 and revised this with a replacement of the upper portion of his fistula with the prosthetic graft into his axillary vein. He has done well with access since that time. He reports that he has had a progressive changes of pain in his hand while on dialysis. This is become to the point where he is not able to tolerate this and has had to stop dialysis on several occasions. He underwent noninvasive studies in our office last week and this did show steal. He is right-handed. He has never had hemodialysis catheter and has never had a right arm access.  Past Medical History:  Diagnosis Date  . Anemia    Dr. Sonny Dandy  . Arthritis   . Benign prostatic hypertrophy   . Carpal tunnel syndrome of left wrist   . Coronary atherosclerosis of native coronary artery    Nonobstructive 2009  . ESRD on hemodialysis ([place])   . Essential hypertension, benign   . GERD (gastroesophageal reflux disease)   . Headache   . History of pneumonia   . HOH (hard of hearing)    Bilateral  hearing aids  . Hypothyroidism   . Persistent atrial fibrillation (HCC)    Declines coumadin  . Presence of permanent cardiac pacemaker   . Sick sinus syndrome [facility])    Status post pacemaker placement 2008  . Skin cancer, basal cell     Family History  Problem Relation Age of Onset  . Coronary artery disease Father   . Heart  disease Father        after age 38  . Coronary artery disease Mother   . Heart disease Mother        After age 62  . Coronary artery disease Sister   . Heart disease Sister        After age 18    SOCIAL HISTORY: Social History  Substance Use Topics  . Smoking status: Former Smoker    Packs/day: 0.80    Years: 12.00    Types: Cigarettes    Start date: 01/20/1948    Quit date: 01/20/1960  . Smokeless tobacco: Former Systems developer    Types: Chew    Quit date: 01/20/1980     Comment: chewed tobacco x's 10 years while playing golf only,used a pack per week  . Alcohol use 0.0 oz/week     Comment: Occasional glass of wine    Allergies  Allergen Reactions  . Oxycodone Hcl Rash    Current Outpatient Prescriptions  Medication Sig Dispense Refill  . amiodarone (PACERONE) 200 MG tablet Take 200 mg by mouth daily.    Marland Kitchen aspirin 81 MG tablet Take 81 mg by mouth daily.      Marland Kitchen atenolol (TENORMIN) 25 MG tablet Take 12.5 mg by mouth daily.    Marland Kitchen docusate sodium (COLACE) 100 MG capsule Take 100 mg by mouth as needed for constipation.    Marland Kitchen  fluticasone furoate-vilanterol (BREO ELLIPTA) 200-25 MCG/INH AEPB Inhale 1 puff into the lungs daily.    . folic acid (FOLVITE) 1 MG tablet Take 1 mg by mouth daily.      Marland Kitchen gabapentin (NEURONTIN) 400 MG capsule Take 400 mg by mouth daily.      Marland Kitchen HYDROcodone-acetaminophen (NORCO) 10-325 MG tablet Take 1 tablet by mouth every 6 (six) hours as needed for severe pain. 30 tablet 0  . levothyroxine (SYNTHROID, LEVOTHROID) 112 MCG tablet Take 112 mcg by mouth daily.    . midodrine (PROAMATINE) 10 MG tablet Take 1 tablet by mouth 2 (two) times daily.    . nitroGLYCERIN (NITROSTAT) 0.4 MG SL tablet Place 1 tablet (0.4 mg total) under the tongue every 5 (five) minutes as needed. (Patient taking differently: Place 0.4 mg under the tongue every 5 (five) minutes as needed for chest pain. ) 25 tablet 2  . omeprazole (PRILOSEC) 20 MG capsule Take 20 mg by mouth daily.      . sevelamer  carbonate (RENVELA) 800 MG tablet Take 800 mg by mouth 3 (three) times daily with meals.     No current facility-administered medications for this visit.     REVIEW OF SYSTEMS:  [X]  denotes positive finding, [ ]  denotes negative finding Cardiac  Comments:  Chest pain or chest pressure:    Shortness of breath upon exertion: x   Short of breath when lying flat:    Irregular heart rhythm: x       Vascular    Pain in calf, thigh, or hip brought on by ambulation: x   Pain in feet at night that wakes you up from your sleep:     Blood clot in your veins: x   Leg swelling:  x         PHYSICAL EXAM: Vitals:   06/17/16 0948  BP: 140/63  Pulse: 64  Resp: 20  Temp: 97.4 F (36.3 C)  TempSrc: Oral  SpO2: 98%  Weight: 172 lb (78 kg)  Height: 5\' 10"  (1.778 m)    GENERAL: The patient is a well-nourished male, in no acute distress. The vital signs are documented above. CARDIOVASCULAR: Well-developed left upper arm fistula with excellent thrill. I do not palpate a radial pulse with his fistula patent. On compression of his fistula he has a 2+ radial pulse. On the right he does have a 2+ radial pulse and a normal caliber cephalic vein throughout its course in the forearm extending into the upper arm PULMONARY: There is good air exchange  MUSCULOSKELETAL: There are no major deformities or cyanosis. NEUROLOGIC: No focal weakness or paresthesias are detected. SKIN: There are no ulcers or rashes noted. PSYCHIATRIC: The patient has a normal affect.  DATA:  Noninvasive studies from last week demonstrate significant increase in his pressure on the left hand with compression of the graft indicating steal  MEDICAL ISSUES: Had long discussion with the patient and his son present. Explained the potential for narrowing the fistula but explained my experience this is been very unsatisfactory. Explained that it high likelihood that either it will not provide adequate restriction for relief of the steal  or will cause thrombosis. I have recommended ligation of this fistula for immediate relief of his pain. Have also recommended hemodialysis catheter and right radiocephalic fistula on the same setting. Pain that he would use a catheter for approximately 3 months and then began using the fistula Cimino has had inadequate maturation. Also explained that there is a that the  fistula may not mature would require additional procedures in the right arm to obtain successful hemodialysis in the right arm. He has hemodialysis on Tuesday, Thursday and Saturday. Wishes to proceed as soon as possible as an outpatient    Rosetta Posner, MD [facility] Tel [phone] Pager [phone]

## 2016-06-24 NOTE — Anesthesia Preprocedure Evaluation (Addendum)
Anesthesia Evaluation  Patient identified by MRN, date of birth, ID band Patient awake    Reviewed: Allergy & Precautions, H&P , NPO status , Patient's Chart, lab work & pertinent test results  Airway Mallampati: II  TM Distance: >3 FB Neck ROM: Full    Dental no notable dental hx. (+) Partial Lower, Partial Upper, Dental Advisory Given   Pulmonary neg pulmonary ROS, former smoker,    Pulmonary exam normal breath sounds clear to auscultation       Cardiovascular hypertension, Pt. on medications and Pt. on home beta blockers + CAD  + pacemaker  Rhythm:Regular Rate:Normal     Neuro/Psych  Headaches, negative psych ROS   GI/Hepatic Neg liver ROS, GERD  Medicated and Controlled,  Endo/Other  Hypothyroidism   Renal/GU ESRF and DialysisRenal disease  negative genitourinary   Musculoskeletal  (+) Arthritis , Osteoarthritis,    Abdominal   Peds  Hematology negative hematology ROS (+) anemia ,   Anesthesia Other Findings   Reproductive/Obstetrics negative OB ROS                            Anesthesia Physical Anesthesia Plan  ASA: III  Anesthesia Plan: MAC   Post-op Pain Management:    Induction: Intravenous  PONV Risk Score and Plan: 2 and Ondansetron and Propofol  Airway Management Planned: Simple Face Mask  Additional Equipment:   Intra-op Plan:   Post-operative Plan:   Informed Consent: I have reviewed the patients History and Physical, chart, labs and discussed the procedure including the risks, benefits and alternatives for the proposed anesthesia with the patient or authorized representative who has indicated his/her understanding and acceptance.   Dental advisory given  Plan Discussed with: CRNA  Anesthesia Plan Comments:         Anesthesia Quick Evaluation

## 2016-06-25 ENCOUNTER — Telehealth: Payer: Self-pay | Admitting: Vascular Surgery

## 2016-06-25 ENCOUNTER — Encounter (HOSPITAL_COMMUNITY): Payer: Self-pay | Admitting: Vascular Surgery

## 2016-06-25 DIAGNOSIS — N186 End stage renal disease: Secondary | ICD-10-CM | POA: Diagnosis not present

## 2016-06-25 DIAGNOSIS — N2581 Secondary hyperparathyroidism of renal origin: Secondary | ICD-10-CM | POA: Diagnosis not present

## 2016-06-25 DIAGNOSIS — D631 Anemia in chronic kidney disease: Secondary | ICD-10-CM | POA: Diagnosis not present

## 2016-06-25 DIAGNOSIS — Z992 Dependence on renal dialysis: Secondary | ICD-10-CM | POA: Diagnosis not present

## 2016-06-25 DIAGNOSIS — D509 Iron deficiency anemia, unspecified: Secondary | ICD-10-CM | POA: Diagnosis not present

## 2016-06-25 NOTE — Telephone Encounter (Signed)
Sched lab 07/21/16 at 11:00 and MD 07/28/16 at 8:45. Lm on hm# for pt to confirm appts.

## 2016-06-25 NOTE — Telephone Encounter (Signed)
-----   Message from Mena Goes, RN sent at 06/24/2016  3:58 PM EDT ----- Regarding: 4-6 weeks   ----- Message ----- From: Alvia Grove, PA-C Sent: 06/24/2016   3:47 PM To: Vvs Charge Pool  S/p ligation left AVF, creation of right radial-cephalic AVF 04/25/63  F/u with Dr. Donnetta Hutching in 4-6 weeks with duplex  Thanks Maudie Mercury

## 2016-06-27 DIAGNOSIS — Z992 Dependence on renal dialysis: Secondary | ICD-10-CM | POA: Diagnosis not present

## 2016-06-27 DIAGNOSIS — N2581 Secondary hyperparathyroidism of renal origin: Secondary | ICD-10-CM | POA: Diagnosis not present

## 2016-06-27 DIAGNOSIS — D631 Anemia in chronic kidney disease: Secondary | ICD-10-CM | POA: Diagnosis not present

## 2016-06-27 DIAGNOSIS — N186 End stage renal disease: Secondary | ICD-10-CM | POA: Diagnosis not present

## 2016-06-27 DIAGNOSIS — D509 Iron deficiency anemia, unspecified: Secondary | ICD-10-CM | POA: Diagnosis not present

## 2016-06-30 DIAGNOSIS — D631 Anemia in chronic kidney disease: Secondary | ICD-10-CM | POA: Diagnosis not present

## 2016-06-30 DIAGNOSIS — N2581 Secondary hyperparathyroidism of renal origin: Secondary | ICD-10-CM | POA: Diagnosis not present

## 2016-06-30 DIAGNOSIS — N186 End stage renal disease: Secondary | ICD-10-CM | POA: Diagnosis not present

## 2016-06-30 DIAGNOSIS — Z992 Dependence on renal dialysis: Secondary | ICD-10-CM | POA: Diagnosis not present

## 2016-06-30 DIAGNOSIS — D509 Iron deficiency anemia, unspecified: Secondary | ICD-10-CM | POA: Diagnosis not present

## 2016-07-02 DIAGNOSIS — N2581 Secondary hyperparathyroidism of renal origin: Secondary | ICD-10-CM | POA: Diagnosis not present

## 2016-07-02 DIAGNOSIS — N186 End stage renal disease: Secondary | ICD-10-CM | POA: Diagnosis not present

## 2016-07-02 DIAGNOSIS — D631 Anemia in chronic kidney disease: Secondary | ICD-10-CM | POA: Diagnosis not present

## 2016-07-02 DIAGNOSIS — D509 Iron deficiency anemia, unspecified: Secondary | ICD-10-CM | POA: Diagnosis not present

## 2016-07-02 DIAGNOSIS — Z992 Dependence on renal dialysis: Secondary | ICD-10-CM | POA: Diagnosis not present

## 2016-07-04 DIAGNOSIS — N2581 Secondary hyperparathyroidism of renal origin: Secondary | ICD-10-CM | POA: Diagnosis not present

## 2016-07-04 DIAGNOSIS — D509 Iron deficiency anemia, unspecified: Secondary | ICD-10-CM | POA: Diagnosis not present

## 2016-07-04 DIAGNOSIS — N186 End stage renal disease: Secondary | ICD-10-CM | POA: Diagnosis not present

## 2016-07-04 DIAGNOSIS — D631 Anemia in chronic kidney disease: Secondary | ICD-10-CM | POA: Diagnosis not present

## 2016-07-04 DIAGNOSIS — Z992 Dependence on renal dialysis: Secondary | ICD-10-CM | POA: Diagnosis not present

## 2016-07-07 DIAGNOSIS — N186 End stage renal disease: Secondary | ICD-10-CM | POA: Diagnosis not present

## 2016-07-07 DIAGNOSIS — Z992 Dependence on renal dialysis: Secondary | ICD-10-CM | POA: Diagnosis not present

## 2016-07-07 DIAGNOSIS — D509 Iron deficiency anemia, unspecified: Secondary | ICD-10-CM | POA: Diagnosis not present

## 2016-07-07 DIAGNOSIS — D631 Anemia in chronic kidney disease: Secondary | ICD-10-CM | POA: Diagnosis not present

## 2016-07-07 DIAGNOSIS — N2581 Secondary hyperparathyroidism of renal origin: Secondary | ICD-10-CM | POA: Diagnosis not present

## 2016-07-08 DIAGNOSIS — Z79899 Other long term (current) drug therapy: Secondary | ICD-10-CM | POA: Diagnosis not present

## 2016-07-08 DIAGNOSIS — I4891 Unspecified atrial fibrillation: Secondary | ICD-10-CM | POA: Diagnosis not present

## 2016-07-08 DIAGNOSIS — Z299 Encounter for prophylactic measures, unspecified: Secondary | ICD-10-CM | POA: Diagnosis not present

## 2016-07-08 DIAGNOSIS — I770 Arteriovenous fistula, acquired: Secondary | ICD-10-CM | POA: Diagnosis not present

## 2016-07-08 DIAGNOSIS — M461 Sacroiliitis, not elsewhere classified: Secondary | ICD-10-CM | POA: Diagnosis not present

## 2016-07-08 DIAGNOSIS — I10 Essential (primary) hypertension: Secondary | ICD-10-CM | POA: Diagnosis not present

## 2016-07-08 DIAGNOSIS — Z992 Dependence on renal dialysis: Secondary | ICD-10-CM | POA: Diagnosis not present

## 2016-07-08 DIAGNOSIS — N185 Chronic kidney disease, stage 5: Secondary | ICD-10-CM | POA: Diagnosis not present

## 2016-07-08 DIAGNOSIS — E039 Hypothyroidism, unspecified: Secondary | ICD-10-CM | POA: Diagnosis not present

## 2016-07-08 DIAGNOSIS — Z6827 Body mass index (BMI) 27.0-27.9, adult: Secondary | ICD-10-CM | POA: Diagnosis not present

## 2016-07-11 DIAGNOSIS — D509 Iron deficiency anemia, unspecified: Secondary | ICD-10-CM | POA: Diagnosis not present

## 2016-07-11 DIAGNOSIS — D631 Anemia in chronic kidney disease: Secondary | ICD-10-CM | POA: Diagnosis not present

## 2016-07-11 DIAGNOSIS — Z992 Dependence on renal dialysis: Secondary | ICD-10-CM | POA: Diagnosis not present

## 2016-07-11 DIAGNOSIS — N186 End stage renal disease: Secondary | ICD-10-CM | POA: Diagnosis not present

## 2016-07-11 DIAGNOSIS — N2581 Secondary hyperparathyroidism of renal origin: Secondary | ICD-10-CM | POA: Diagnosis not present

## 2016-07-14 DIAGNOSIS — Z992 Dependence on renal dialysis: Secondary | ICD-10-CM | POA: Diagnosis not present

## 2016-07-14 DIAGNOSIS — D631 Anemia in chronic kidney disease: Secondary | ICD-10-CM | POA: Diagnosis not present

## 2016-07-14 DIAGNOSIS — D509 Iron deficiency anemia, unspecified: Secondary | ICD-10-CM | POA: Diagnosis not present

## 2016-07-14 DIAGNOSIS — N186 End stage renal disease: Secondary | ICD-10-CM | POA: Diagnosis not present

## 2016-07-14 DIAGNOSIS — N2581 Secondary hyperparathyroidism of renal origin: Secondary | ICD-10-CM | POA: Diagnosis not present

## 2016-07-16 DIAGNOSIS — N2581 Secondary hyperparathyroidism of renal origin: Secondary | ICD-10-CM | POA: Diagnosis not present

## 2016-07-16 DIAGNOSIS — D631 Anemia in chronic kidney disease: Secondary | ICD-10-CM | POA: Diagnosis not present

## 2016-07-16 DIAGNOSIS — N186 End stage renal disease: Secondary | ICD-10-CM | POA: Diagnosis not present

## 2016-07-16 DIAGNOSIS — Z992 Dependence on renal dialysis: Secondary | ICD-10-CM | POA: Diagnosis not present

## 2016-07-16 DIAGNOSIS — D509 Iron deficiency anemia, unspecified: Secondary | ICD-10-CM | POA: Diagnosis not present

## 2016-07-18 DIAGNOSIS — D509 Iron deficiency anemia, unspecified: Secondary | ICD-10-CM | POA: Diagnosis not present

## 2016-07-18 DIAGNOSIS — D631 Anemia in chronic kidney disease: Secondary | ICD-10-CM | POA: Diagnosis not present

## 2016-07-18 DIAGNOSIS — Z992 Dependence on renal dialysis: Secondary | ICD-10-CM | POA: Diagnosis not present

## 2016-07-18 DIAGNOSIS — N2581 Secondary hyperparathyroidism of renal origin: Secondary | ICD-10-CM | POA: Diagnosis not present

## 2016-07-18 DIAGNOSIS — N186 End stage renal disease: Secondary | ICD-10-CM | POA: Diagnosis not present

## 2016-07-21 ENCOUNTER — Encounter (HOSPITAL_COMMUNITY): Payer: Medicare Other

## 2016-07-21 DIAGNOSIS — N186 End stage renal disease: Secondary | ICD-10-CM | POA: Diagnosis not present

## 2016-07-21 DIAGNOSIS — D631 Anemia in chronic kidney disease: Secondary | ICD-10-CM | POA: Diagnosis not present

## 2016-07-21 DIAGNOSIS — Z992 Dependence on renal dialysis: Secondary | ICD-10-CM | POA: Diagnosis not present

## 2016-07-21 DIAGNOSIS — D509 Iron deficiency anemia, unspecified: Secondary | ICD-10-CM | POA: Diagnosis not present

## 2016-07-23 ENCOUNTER — Other Ambulatory Visit: Payer: Self-pay | Admitting: Vascular Surgery

## 2016-07-23 DIAGNOSIS — N186 End stage renal disease: Secondary | ICD-10-CM

## 2016-07-23 DIAGNOSIS — Z992 Dependence on renal dialysis: Secondary | ICD-10-CM | POA: Diagnosis not present

## 2016-07-23 DIAGNOSIS — D509 Iron deficiency anemia, unspecified: Secondary | ICD-10-CM | POA: Diagnosis not present

## 2016-07-23 DIAGNOSIS — D631 Anemia in chronic kidney disease: Secondary | ICD-10-CM | POA: Diagnosis not present

## 2016-07-25 DIAGNOSIS — Z992 Dependence on renal dialysis: Secondary | ICD-10-CM | POA: Diagnosis not present

## 2016-07-25 DIAGNOSIS — D509 Iron deficiency anemia, unspecified: Secondary | ICD-10-CM | POA: Diagnosis not present

## 2016-07-25 DIAGNOSIS — D631 Anemia in chronic kidney disease: Secondary | ICD-10-CM | POA: Diagnosis not present

## 2016-07-25 DIAGNOSIS — N186 End stage renal disease: Secondary | ICD-10-CM | POA: Diagnosis not present

## 2016-07-27 ENCOUNTER — Ambulatory Visit (HOSPITAL_COMMUNITY)
Admission: RE | Admit: 2016-07-27 | Discharge: 2016-07-27 | Disposition: A | Discharge status: Home / Self Care | Payer: Medicare Other | Source: Ambulatory Visit | Attending: Surgery | Admitting: Surgery

## 2016-07-27 DIAGNOSIS — N186 End stage renal disease: Secondary | ICD-10-CM

## 2016-07-27 DIAGNOSIS — Z992 Dependence on renal dialysis: Secondary | ICD-10-CM

## 2016-07-28 ENCOUNTER — Encounter: Payer: Medicare Other | Admitting: Vascular Surgery

## 2016-07-28 DIAGNOSIS — N186 End stage renal disease: Secondary | ICD-10-CM | POA: Diagnosis not present

## 2016-07-28 DIAGNOSIS — D631 Anemia in chronic kidney disease: Secondary | ICD-10-CM | POA: Diagnosis not present

## 2016-07-28 DIAGNOSIS — Z992 Dependence on renal dialysis: Secondary | ICD-10-CM | POA: Diagnosis not present

## 2016-07-28 DIAGNOSIS — D509 Iron deficiency anemia, unspecified: Secondary | ICD-10-CM | POA: Diagnosis not present

## 2016-07-29 ENCOUNTER — Encounter: Payer: Self-pay | Admitting: Vascular Surgery

## 2016-07-30 DIAGNOSIS — D509 Iron deficiency anemia, unspecified: Secondary | ICD-10-CM | POA: Diagnosis not present

## 2016-07-30 DIAGNOSIS — D631 Anemia in chronic kidney disease: Secondary | ICD-10-CM | POA: Diagnosis not present

## 2016-07-30 DIAGNOSIS — Z992 Dependence on renal dialysis: Secondary | ICD-10-CM | POA: Diagnosis not present

## 2016-07-30 DIAGNOSIS — N186 End stage renal disease: Secondary | ICD-10-CM | POA: Diagnosis not present

## 2016-08-01 DIAGNOSIS — D509 Iron deficiency anemia, unspecified: Secondary | ICD-10-CM | POA: Diagnosis not present

## 2016-08-01 DIAGNOSIS — N186 End stage renal disease: Secondary | ICD-10-CM | POA: Diagnosis not present

## 2016-08-01 DIAGNOSIS — Z992 Dependence on renal dialysis: Secondary | ICD-10-CM | POA: Diagnosis not present

## 2016-08-01 DIAGNOSIS — D631 Anemia in chronic kidney disease: Secondary | ICD-10-CM | POA: Diagnosis not present

## 2016-08-03 ENCOUNTER — Encounter: Payer: Medicare Other | Admitting: *Deleted

## 2016-08-03 ENCOUNTER — Telehealth: Payer: Self-pay | Admitting: Cardiology

## 2016-08-03 NOTE — Telephone Encounter (Signed)
LMOVM reminding pt to send remote transmission.   

## 2016-08-04 DIAGNOSIS — N186 End stage renal disease: Secondary | ICD-10-CM | POA: Diagnosis not present

## 2016-08-04 DIAGNOSIS — D509 Iron deficiency anemia, unspecified: Secondary | ICD-10-CM | POA: Diagnosis not present

## 2016-08-04 DIAGNOSIS — D631 Anemia in chronic kidney disease: Secondary | ICD-10-CM | POA: Diagnosis not present

## 2016-08-04 DIAGNOSIS — Z992 Dependence on renal dialysis: Secondary | ICD-10-CM | POA: Diagnosis not present

## 2016-08-06 ENCOUNTER — Encounter: Payer: Self-pay | Admitting: Cardiology

## 2016-08-06 ENCOUNTER — Ambulatory Visit (HOSPITAL_COMMUNITY)
Admission: RE | Admit: 2016-08-06 | Discharge: 2016-08-06 | Disposition: A | Discharge status: Home / Self Care | Payer: Medicare Other | Source: Ambulatory Visit | Attending: Nephrology | Admitting: Nephrology

## 2016-08-06 ENCOUNTER — Encounter (HOSPITAL_COMMUNITY): Payer: Self-pay | Admitting: Interventional Radiology

## 2016-08-06 ENCOUNTER — Other Ambulatory Visit (HOSPITAL_COMMUNITY): Payer: Self-pay | Admitting: Nephrology

## 2016-08-06 ENCOUNTER — Telehealth: Payer: Self-pay | Admitting: Internal Medicine

## 2016-08-06 DIAGNOSIS — T82524A Displacement of infusion catheter, initial encounter: Secondary | ICD-10-CM | POA: Diagnosis not present

## 2016-08-06 DIAGNOSIS — N186 End stage renal disease: Secondary | ICD-10-CM

## 2016-08-06 DIAGNOSIS — Z7982 Long term (current) use of aspirin: Secondary | ICD-10-CM | POA: Diagnosis not present

## 2016-08-06 DIAGNOSIS — Z992 Dependence on renal dialysis: Secondary | ICD-10-CM | POA: Diagnosis not present

## 2016-08-06 DIAGNOSIS — Y712 Prosthetic and other implants, materials and accessory cardiovascular devices associated with adverse incidents: Secondary | ICD-10-CM | POA: Diagnosis not present

## 2016-08-06 DIAGNOSIS — N179 Acute kidney failure, unspecified: Secondary | ICD-10-CM | POA: Insufficient documentation

## 2016-08-06 DIAGNOSIS — T82898A Other specified complication of vascular prosthetic devices, implants and grafts, initial encounter: Secondary | ICD-10-CM | POA: Diagnosis not present

## 2016-08-06 HISTORY — PX: IR FLUORO GUIDE CV LINE RIGHT: IMG2283

## 2016-08-06 LAB — POTASSIUM: Potassium: 5.1 mmol/L (ref 3.5–5.1)

## 2016-08-06 MED ORDER — HEPARIN SODIUM (PORCINE) 1000 UNIT/ML IJ SOLN
INTRAMUSCULAR | Status: DC | PRN
  Administered 2016-08-06: 4.2 mL

## 2016-08-06 MED ORDER — LIDOCAINE HCL (PF) 1 % IJ SOLN
INTRAMUSCULAR | Status: AC
  Filled 2016-08-06: qty 30

## 2016-08-06 MED ORDER — HEPARIN SODIUM (PORCINE) 1000 UNIT/ML IJ SOLN
INTRAMUSCULAR | Status: AC
  Filled 2016-08-06: qty 1

## 2016-08-06 MED ORDER — CEFAZOLIN SODIUM-DEXTROSE 2-4 GM/100ML-% IV SOLN
2.0000 g | Freq: Once | INTRAVENOUS | Status: AC
  Administered 2016-08-06: 2 g via INTRAVENOUS

## 2016-08-06 MED ORDER — CEFAZOLIN SODIUM-DEXTROSE 2-4 GM/100ML-% IV SOLN
INTRAVENOUS | Status: AC
  Filled 2016-08-06: qty 100

## 2016-08-06 MED ORDER — CHLORHEXIDINE GLUCONATE 4 % EX LIQD
CUTANEOUS | Status: AC
  Filled 2016-08-06: qty 15

## 2016-08-06 MED ORDER — LIDOCAINE HCL 1 % IJ SOLN
INTRAMUSCULAR | Status: DC | PRN
  Administered 2016-08-06: 5 mL

## 2016-08-06 NOTE — Telephone Encounter (Signed)
Mateo Flow -daughter called [employer] wanting to know if Mr. Summons last transmission went thru.

## 2016-08-06 NOTE — Procedures (Signed)
LIJV HD catheter exchange 28 cm EBL 0 Comp 0 SVC RA

## 2016-08-06 NOTE — Progress Notes (Signed)
Referring Physician(s): Fran Lowes  Supervising Physician: Marybelle Killings  Patient Status:  [facility] OP  Chief Complaint:  Left IJ tunneled Hemodialysis catheter is malpositioned  Subjective:  Last use was Tues- without complication Today; dialysis center did not attempt to use because cuff of catheter was at skin site Feel catheter is malpositioned Sent to IR for exchange  Allergies: Oxycodone hcl  Medications: Prior to Admission medications   Medication Sig Start Date End Date Taking? Authorizing Provider  amiodarone (PACERONE) 200 MG tablet Take 200 mg by mouth daily.    [provider]  aspirin 81 MG tablet Take 81 mg by mouth at bedtime.     [provider]  atenolol (TENORMIN) 25 MG tablet Take 12.5 mg by mouth daily.    [provider]  docusate sodium (COLACE) 100 MG capsule Take 100 mg by mouth 2 (two) times daily.     [provider]  folic acid (FOLVITE) 1 MG tablet Take 1 mg by mouth daily.      [provider]  gabapentin (NEURONTIN) 400 MG capsule Take 400 mg by mouth at bedtime.     [provider]  HYDROcodone-acetaminophen (NORCO) 10-325 MG tablet Take 1 tablet by mouth every 6 (six) hours as needed for severe pain. 06/24/16   Alvia Grove, PA-C  levothyroxine (SYNTHROID, LEVOTHROID) 125 MCG tablet Take 125 mcg by mouth daily before breakfast. 04/30/16   [provider]  MELATONIN PO Take 1 tablet by mouth at bedtime.    [provider]  midodrine (PROAMATINE) 10 MG tablet Take 10 mg by mouth 2 (two) times daily.  02/14/15   [provider]  nitroGLYCERIN (NITROSTAT) 0.4 MG SL tablet Place 1 tablet (0.4 mg total) under the tongue every 5 (five) minutes as needed. Patient taking differently: Place 0.4 mg under the tongue every 5 (five) minutes as needed for chest pain.  06/24/11   Allred, Jeneen Rinks, MD  omeprazole (PRILOSEC) 20 MG capsule Take 20 mg by mouth daily.      [provider]  sevelamer carbonate (RENVELA) 800 MG tablet Take 800-3,200 mg by mouth. Take 3200mg  with each meal and 800mg  with each snack    [provider]  tamsulosin (FLOMAX) 0.4 MG CAPS capsule Take 0.4 mg by mouth at bedtime. 05/30/16   [provider]     Vital Signs: There were no vitals taken for this visit.  Physical Exam  Constitutional: He is oriented to person, place, and time.  Cardiovascular: Normal rate and regular rhythm.   Pulmonary/Chest: Effort normal and breath sounds normal.  Abdominal: Soft.  Musculoskeletal: Normal range of motion.  Neurological: He is alert and oriented to person, place, and time.  Skin: Skin is warm and dry.  Psychiatric: He has a normal mood and affect. His behavior is normal. Judgment and thought content normal.  Nursing note and vitals reviewed.   Imaging: No results found.  Labs:  CBC:  Recent Labs  06/24/16 1229  HGB 10.9*  HCT 32.0*    COAGS: No results for input(s): INR, APTT in the last 8760 hours.  BMP:  Recent Labs  06/24/16 1229  NA 135  K 4.6  GLUCOSE 88    LIVER FUNCTION TESTS: No results for input(s): BILITOT, AST, ALT, ALKPHOS, PROT, ALBUMIN in the last 8760 hours.  Assessment and Plan:  Malpositioned Left IJ tunneled hemodialysis catheter For exchange in IR Pt and family aware of procedure benefits and risks; including but not  limited to Infection; bleeding; vessel damage Agreeable to proceed Consent signed andin chart  Electronically Signed: Reford Olliff A, PA-C 08/06/2016, 2:27 PM   I spent a total of 15 Minutes at the the patient's bedside AND on the patient's hospital floor or unit, greater than 50% of which was counseling/coordinating care for dialysis catheter exchange

## 2016-08-06 NOTE — Telephone Encounter (Signed)
LVM for pts daughter to call back.  

## 2016-08-07 DIAGNOSIS — Z992 Dependence on renal dialysis: Secondary | ICD-10-CM | POA: Diagnosis not present

## 2016-08-07 DIAGNOSIS — D509 Iron deficiency anemia, unspecified: Secondary | ICD-10-CM | POA: Diagnosis not present

## 2016-08-07 DIAGNOSIS — N186 End stage renal disease: Secondary | ICD-10-CM | POA: Diagnosis not present

## 2016-08-07 DIAGNOSIS — D631 Anemia in chronic kidney disease: Secondary | ICD-10-CM | POA: Diagnosis not present

## 2016-08-08 DIAGNOSIS — Z992 Dependence on renal dialysis: Secondary | ICD-10-CM | POA: Diagnosis not present

## 2016-08-08 DIAGNOSIS — D509 Iron deficiency anemia, unspecified: Secondary | ICD-10-CM | POA: Diagnosis not present

## 2016-08-08 DIAGNOSIS — N186 End stage renal disease: Secondary | ICD-10-CM | POA: Diagnosis not present

## 2016-08-08 DIAGNOSIS — D631 Anemia in chronic kidney disease: Secondary | ICD-10-CM | POA: Diagnosis not present

## 2016-08-10 DIAGNOSIS — Z299 Encounter for prophylactic measures, unspecified: Secondary | ICD-10-CM | POA: Diagnosis not present

## 2016-08-10 DIAGNOSIS — I10 Essential (primary) hypertension: Secondary | ICD-10-CM | POA: Diagnosis not present

## 2016-08-10 DIAGNOSIS — I4891 Unspecified atrial fibrillation: Secondary | ICD-10-CM | POA: Diagnosis not present

## 2016-08-10 DIAGNOSIS — E039 Hypothyroidism, unspecified: Secondary | ICD-10-CM | POA: Diagnosis not present

## 2016-08-10 DIAGNOSIS — N185 Chronic kidney disease, stage 5: Secondary | ICD-10-CM | POA: Diagnosis not present

## 2016-08-10 DIAGNOSIS — N400 Enlarged prostate without lower urinary tract symptoms: Secondary | ICD-10-CM | POA: Diagnosis not present

## 2016-08-10 DIAGNOSIS — Z6828 Body mass index (BMI) 28.0-28.9, adult: Secondary | ICD-10-CM | POA: Diagnosis not present

## 2016-08-10 DIAGNOSIS — M1710 Unilateral primary osteoarthritis, unspecified knee: Secondary | ICD-10-CM | POA: Diagnosis not present

## 2016-08-10 DIAGNOSIS — I770 Arteriovenous fistula, acquired: Secondary | ICD-10-CM | POA: Diagnosis not present

## 2016-08-10 DIAGNOSIS — Z992 Dependence on renal dialysis: Secondary | ICD-10-CM | POA: Diagnosis not present

## 2016-08-10 NOTE — Telephone Encounter (Signed)
Spoke with patient who stated that he and his daughter were having difficulty sending a remote transmission. He requested that I call his daughter because she was the one who handled his remote monitor.

## 2016-08-10 NOTE — Telephone Encounter (Signed)
Spoke with patients daughter regarding sending patients remote transmission. She stated that they were using a phone line which I explained would no longer be useful. I ordered a wirex and explained how to connect the wireX. She verbalized understanidng and will call once it is received if they have any issues.

## 2016-08-11 ENCOUNTER — Encounter: Payer: Self-pay | Admitting: Vascular Surgery

## 2016-08-11 ENCOUNTER — Ambulatory Visit (INDEPENDENT_AMBULATORY_CARE_PROVIDER_SITE_OTHER): Payer: Self-pay | Admitting: Vascular Surgery

## 2016-08-11 VITALS — BP 110/57 | HR 63 | Temp 97.9°F | Resp 16 | Ht 70.0 in | Wt 170.0 lb

## 2016-08-11 DIAGNOSIS — Z992 Dependence on renal dialysis: Secondary | ICD-10-CM

## 2016-08-11 DIAGNOSIS — N186 End stage renal disease: Secondary | ICD-10-CM | POA: Diagnosis not present

## 2016-08-11 DIAGNOSIS — D631 Anemia in chronic kidney disease: Secondary | ICD-10-CM | POA: Diagnosis not present

## 2016-08-11 DIAGNOSIS — D509 Iron deficiency anemia, unspecified: Secondary | ICD-10-CM | POA: Diagnosis not present

## 2016-08-11 NOTE — Progress Notes (Signed)
Patient name: Dale Torres MRN: [id number] DOB: 06-Jul-1925 Sex: male  REASON FOR VISIT: Follow-up recent access surgery  HPI: Dale Torres is a 83 y.o. male who had developed progressive severe steal syndrome in his left hand with a left upper arm AV fistula patent. He underwent ligation of the left upper arm AV fistula and placement of a right radiocephalic fistula on 09/62/8366. Also had placement of a left IJ catheter at that time. He is here today for follow-up. He reports that he is mild numbness in his left hand occasionally but no as no longer the severe pain and aching in his hand that made him consider discontinuing dialysis. He does report overall numbness in both upper extremities when he is on dialysis that resolved immediately with discontinuation of dialysis.  Current Outpatient Prescriptions  Medication Sig Dispense Refill  . amiodarone (PACERONE) 200 MG tablet Take 200 mg by mouth daily.    Marland Kitchen aspirin 81 MG tablet Take 81 mg by mouth at bedtime.     Marland Kitchen atenolol (TENORMIN) 25 MG tablet Take 12.5 mg by mouth daily.    Marland Kitchen docusate sodium (COLACE) 100 MG capsule Take 100 mg by mouth 2 (two) times daily.     . folic acid (FOLVITE) 1 MG tablet Take 1 mg by mouth daily.      Marland Kitchen gabapentin (NEURONTIN) 400 MG capsule Take 400 mg by mouth at bedtime.     Marland Kitchen HYDROcodone-acetaminophen (NORCO) 10-325 MG tablet Take 1 tablet by mouth every 6 (six) hours as needed for severe pain. 15 tablet 0  . levothyroxine (SYNTHROID, LEVOTHROID) 125 MCG tablet Take 125 mcg by mouth daily before breakfast.    . MELATONIN PO Take 1 tablet by mouth at bedtime.    . midodrine (PROAMATINE) 10 MG tablet Take 10 mg by mouth 2 (two) times daily.     . nitroGLYCERIN (NITROSTAT) 0.4 MG SL tablet Place 1 tablet (0.4 mg total) under the tongue every 5 (five) minutes as needed. (Patient taking differently: Place 0.4 mg under the tongue every 5 (five) minutes as needed for chest pain. )  25 tablet 2  . omeprazole (PRILOSEC) 20 MG capsule Take 20 mg by mouth daily.      . sevelamer carbonate (RENVELA) 800 MG tablet Take 800-3,200 mg by mouth. Take 3200mg  with each meal and 800mg  with each snack    . tamsulosin (FLOMAX) 0.4 MG CAPS capsule Take 0.4 mg by mouth at bedtime.     No current facility-administered medications for this visit.      PHYSICAL EXAM: Vitals:   08/11/16 1333  BP: (!) 110/57  Pulse: 63  Resp: 16  Temp: 97.9 F (36.6 C)  TempSrc: Oral  SpO2: 98%  Weight: 170 lb (77.1 kg)  Height: 5\' 10"  (1.778 m)    GENERAL: The patient is a well-nourished male, in no acute distress. The vital signs are documented above. Palpable left radial pulse and throat and occluded left upper arm AV fistula. His right radiocephalic fistula incision is healed and he has excellent maturation of his cephalic vein. It runs very superficial to the skin and is a good diameter dilatation and runs in a very straight course.  MEDICAL ISSUES: Patient has had resolution of his left arm steal symptoms. Has excellent early maturation of his right arm fistula. Should be able to use this beginning in September and can have discontinuation of his catheter at that time. We can coordinate this as an outpatient in short  stay at [place] F. Early, MD [facility] Tel [phone] Pager [phone]

## 2016-08-13 DIAGNOSIS — N186 End stage renal disease: Secondary | ICD-10-CM | POA: Diagnosis not present

## 2016-08-13 DIAGNOSIS — Z992 Dependence on renal dialysis: Secondary | ICD-10-CM | POA: Diagnosis not present

## 2016-08-13 DIAGNOSIS — D509 Iron deficiency anemia, unspecified: Secondary | ICD-10-CM | POA: Diagnosis not present

## 2016-08-13 DIAGNOSIS — D631 Anemia in chronic kidney disease: Secondary | ICD-10-CM | POA: Diagnosis not present

## 2016-08-15 DIAGNOSIS — D509 Iron deficiency anemia, unspecified: Secondary | ICD-10-CM | POA: Diagnosis not present

## 2016-08-15 DIAGNOSIS — N186 End stage renal disease: Secondary | ICD-10-CM | POA: Diagnosis not present

## 2016-08-15 DIAGNOSIS — D631 Anemia in chronic kidney disease: Secondary | ICD-10-CM | POA: Diagnosis not present

## 2016-08-15 DIAGNOSIS — Z992 Dependence on renal dialysis: Secondary | ICD-10-CM | POA: Diagnosis not present

## 2016-08-18 DIAGNOSIS — N186 End stage renal disease: Secondary | ICD-10-CM | POA: Diagnosis not present

## 2016-08-18 DIAGNOSIS — D631 Anemia in chronic kidney disease: Secondary | ICD-10-CM | POA: Diagnosis not present

## 2016-08-18 DIAGNOSIS — D509 Iron deficiency anemia, unspecified: Secondary | ICD-10-CM | POA: Diagnosis not present

## 2016-08-18 DIAGNOSIS — Z992 Dependence on renal dialysis: Secondary | ICD-10-CM | POA: Diagnosis not present

## 2016-08-20 DIAGNOSIS — D631 Anemia in chronic kidney disease: Secondary | ICD-10-CM | POA: Diagnosis not present

## 2016-08-20 DIAGNOSIS — D509 Iron deficiency anemia, unspecified: Secondary | ICD-10-CM | POA: Diagnosis not present

## 2016-08-20 DIAGNOSIS — N186 End stage renal disease: Secondary | ICD-10-CM | POA: Diagnosis not present

## 2016-08-20 DIAGNOSIS — Z992 Dependence on renal dialysis: Secondary | ICD-10-CM | POA: Diagnosis not present

## 2016-08-22 DIAGNOSIS — N186 End stage renal disease: Secondary | ICD-10-CM | POA: Diagnosis not present

## 2016-08-22 DIAGNOSIS — Z992 Dependence on renal dialysis: Secondary | ICD-10-CM | POA: Diagnosis not present

## 2016-08-22 DIAGNOSIS — D509 Iron deficiency anemia, unspecified: Secondary | ICD-10-CM | POA: Diagnosis not present

## 2016-08-22 DIAGNOSIS — D631 Anemia in chronic kidney disease: Secondary | ICD-10-CM | POA: Diagnosis not present

## 2016-08-25 DIAGNOSIS — N186 End stage renal disease: Secondary | ICD-10-CM | POA: Diagnosis not present

## 2016-08-25 DIAGNOSIS — D509 Iron deficiency anemia, unspecified: Secondary | ICD-10-CM | POA: Diagnosis not present

## 2016-08-25 DIAGNOSIS — Z992 Dependence on renal dialysis: Secondary | ICD-10-CM | POA: Diagnosis not present

## 2016-08-25 DIAGNOSIS — D631 Anemia in chronic kidney disease: Secondary | ICD-10-CM | POA: Diagnosis not present

## 2016-08-27 DIAGNOSIS — D509 Iron deficiency anemia, unspecified: Secondary | ICD-10-CM | POA: Diagnosis not present

## 2016-08-27 DIAGNOSIS — D631 Anemia in chronic kidney disease: Secondary | ICD-10-CM | POA: Diagnosis not present

## 2016-08-27 DIAGNOSIS — Z992 Dependence on renal dialysis: Secondary | ICD-10-CM | POA: Diagnosis not present

## 2016-08-27 DIAGNOSIS — N186 End stage renal disease: Secondary | ICD-10-CM | POA: Diagnosis not present

## 2016-08-28 DIAGNOSIS — R3914 Feeling of incomplete bladder emptying: Secondary | ICD-10-CM | POA: Diagnosis not present

## 2016-08-29 DIAGNOSIS — N186 End stage renal disease: Secondary | ICD-10-CM | POA: Diagnosis not present

## 2016-08-29 DIAGNOSIS — D509 Iron deficiency anemia, unspecified: Secondary | ICD-10-CM | POA: Diagnosis not present

## 2016-08-29 DIAGNOSIS — D631 Anemia in chronic kidney disease: Secondary | ICD-10-CM | POA: Diagnosis not present

## 2016-08-29 DIAGNOSIS — Z992 Dependence on renal dialysis: Secondary | ICD-10-CM | POA: Diagnosis not present

## 2016-09-01 ENCOUNTER — Ambulatory Visit (INDEPENDENT_AMBULATORY_CARE_PROVIDER_SITE_OTHER): Payer: Medicare Other | Admitting: *Deleted

## 2016-09-01 DIAGNOSIS — N186 End stage renal disease: Secondary | ICD-10-CM | POA: Diagnosis not present

## 2016-09-01 DIAGNOSIS — I495 Sick sinus syndrome: Secondary | ICD-10-CM

## 2016-09-01 DIAGNOSIS — D509 Iron deficiency anemia, unspecified: Secondary | ICD-10-CM | POA: Diagnosis not present

## 2016-09-01 DIAGNOSIS — Z992 Dependence on renal dialysis: Secondary | ICD-10-CM | POA: Diagnosis not present

## 2016-09-01 DIAGNOSIS — I480 Paroxysmal atrial fibrillation: Secondary | ICD-10-CM

## 2016-09-01 DIAGNOSIS — Z950 Presence of cardiac pacemaker: Secondary | ICD-10-CM | POA: Diagnosis not present

## 2016-09-01 DIAGNOSIS — D631 Anemia in chronic kidney disease: Secondary | ICD-10-CM | POA: Diagnosis not present

## 2016-09-03 DIAGNOSIS — Z992 Dependence on renal dialysis: Secondary | ICD-10-CM | POA: Diagnosis not present

## 2016-09-03 DIAGNOSIS — N186 End stage renal disease: Secondary | ICD-10-CM | POA: Diagnosis not present

## 2016-09-03 DIAGNOSIS — D631 Anemia in chronic kidney disease: Secondary | ICD-10-CM | POA: Diagnosis not present

## 2016-09-03 DIAGNOSIS — D509 Iron deficiency anemia, unspecified: Secondary | ICD-10-CM | POA: Diagnosis not present

## 2016-09-03 NOTE — Progress Notes (Signed)
Remote pacemaker transmission.   

## 2016-09-05 DIAGNOSIS — D631 Anemia in chronic kidney disease: Secondary | ICD-10-CM | POA: Diagnosis not present

## 2016-09-05 DIAGNOSIS — D509 Iron deficiency anemia, unspecified: Secondary | ICD-10-CM | POA: Diagnosis not present

## 2016-09-05 DIAGNOSIS — N186 End stage renal disease: Secondary | ICD-10-CM | POA: Diagnosis not present

## 2016-09-05 DIAGNOSIS — Z992 Dependence on renal dialysis: Secondary | ICD-10-CM | POA: Diagnosis not present

## 2016-09-08 DIAGNOSIS — N186 End stage renal disease: Secondary | ICD-10-CM | POA: Diagnosis not present

## 2016-09-08 DIAGNOSIS — D631 Anemia in chronic kidney disease: Secondary | ICD-10-CM | POA: Diagnosis not present

## 2016-09-08 DIAGNOSIS — D509 Iron deficiency anemia, unspecified: Secondary | ICD-10-CM | POA: Diagnosis not present

## 2016-09-08 DIAGNOSIS — Z992 Dependence on renal dialysis: Secondary | ICD-10-CM | POA: Diagnosis not present

## 2016-09-09 DIAGNOSIS — E039 Hypothyroidism, unspecified: Secondary | ICD-10-CM | POA: Diagnosis not present

## 2016-09-09 DIAGNOSIS — Z6827 Body mass index (BMI) 27.0-27.9, adult: Secondary | ICD-10-CM | POA: Diagnosis not present

## 2016-09-09 DIAGNOSIS — N189 Chronic kidney disease, unspecified: Secondary | ICD-10-CM | POA: Diagnosis not present

## 2016-09-09 DIAGNOSIS — Z79899 Other long term (current) drug therapy: Secondary | ICD-10-CM | POA: Diagnosis not present

## 2016-09-09 DIAGNOSIS — I10 Essential (primary) hypertension: Secondary | ICD-10-CM | POA: Diagnosis not present

## 2016-09-09 DIAGNOSIS — R972 Elevated prostate specific antigen [PSA]: Secondary | ICD-10-CM | POA: Diagnosis not present

## 2016-09-09 DIAGNOSIS — Z299 Encounter for prophylactic measures, unspecified: Secondary | ICD-10-CM | POA: Diagnosis not present

## 2016-09-09 DIAGNOSIS — N400 Enlarged prostate without lower urinary tract symptoms: Secondary | ICD-10-CM | POA: Diagnosis not present

## 2016-09-09 DIAGNOSIS — I770 Arteriovenous fistula, acquired: Secondary | ICD-10-CM | POA: Diagnosis not present

## 2016-09-09 DIAGNOSIS — M549 Dorsalgia, unspecified: Secondary | ICD-10-CM | POA: Diagnosis not present

## 2016-09-09 DIAGNOSIS — N185 Chronic kidney disease, stage 5: Secondary | ICD-10-CM | POA: Diagnosis not present

## 2016-09-09 DIAGNOSIS — I4891 Unspecified atrial fibrillation: Secondary | ICD-10-CM | POA: Diagnosis not present

## 2016-09-10 DIAGNOSIS — D509 Iron deficiency anemia, unspecified: Secondary | ICD-10-CM | POA: Diagnosis not present

## 2016-09-10 DIAGNOSIS — D631 Anemia in chronic kidney disease: Secondary | ICD-10-CM | POA: Diagnosis not present

## 2016-09-10 DIAGNOSIS — Z992 Dependence on renal dialysis: Secondary | ICD-10-CM | POA: Diagnosis not present

## 2016-09-10 DIAGNOSIS — N186 End stage renal disease: Secondary | ICD-10-CM | POA: Diagnosis not present

## 2016-09-10 LAB — CUP PACEART REMOTE DEVICE CHECK
Battery Remaining Longevity: 113 mo
Brady Statistic AP VP Percent: 0 %
Brady Statistic AS VS Percent: 0 %
Date Time Interrogation Session: 20180813201035
Implantable Lead Implant Date: 20080604
Implantable Lead Implant Date: 20080604
Implantable Lead Serial Number: 498205
Implantable Pulse Generator Implant Date: 20130212
Lead Channel Impedance Value: 375 Ohm
Lead Channel Impedance Value: 448 Ohm
Lead Channel Pacing Threshold Amplitude: 0.5 V
Lead Channel Pacing Threshold Pulse Width: 0.4 ms
Lead Channel Setting Pacing Amplitude: 2 V
Lead Channel Setting Sensing Sensitivity: 2.8 mV
MDC IDC LEAD LOCATION: 753859
MDC IDC LEAD LOCATION: 753860
MDC IDC LEAD SERIAL: 601713
MDC IDC MSMT BATTERY IMPEDANCE: 234 Ohm
MDC IDC MSMT BATTERY VOLTAGE: 2.78 V
MDC IDC MSMT LEADCHNL RA PACING THRESHOLD AMPLITUDE: 0.5 V
MDC IDC MSMT LEADCHNL RA PACING THRESHOLD PULSEWIDTH: 0.4 ms
MDC IDC SET LEADCHNL RV PACING AMPLITUDE: 2.5 V
MDC IDC SET LEADCHNL RV PACING PULSEWIDTH: 0.4 ms
MDC IDC STAT BRADY AP VS PERCENT: 99 %
MDC IDC STAT BRADY AS VP PERCENT: 0 %

## 2016-09-12 DIAGNOSIS — D509 Iron deficiency anemia, unspecified: Secondary | ICD-10-CM | POA: Diagnosis not present

## 2016-09-12 DIAGNOSIS — D631 Anemia in chronic kidney disease: Secondary | ICD-10-CM | POA: Diagnosis not present

## 2016-09-12 DIAGNOSIS — N186 End stage renal disease: Secondary | ICD-10-CM | POA: Diagnosis not present

## 2016-09-12 DIAGNOSIS — Z992 Dependence on renal dialysis: Secondary | ICD-10-CM | POA: Diagnosis not present

## 2016-09-15 ENCOUNTER — Encounter: Payer: Self-pay | Admitting: Cardiology

## 2016-09-15 DIAGNOSIS — Z992 Dependence on renal dialysis: Secondary | ICD-10-CM | POA: Diagnosis not present

## 2016-09-15 DIAGNOSIS — D509 Iron deficiency anemia, unspecified: Secondary | ICD-10-CM | POA: Diagnosis not present

## 2016-09-15 DIAGNOSIS — D631 Anemia in chronic kidney disease: Secondary | ICD-10-CM | POA: Diagnosis not present

## 2016-09-15 DIAGNOSIS — N186 End stage renal disease: Secondary | ICD-10-CM | POA: Diagnosis not present

## 2016-09-17 DIAGNOSIS — Z992 Dependence on renal dialysis: Secondary | ICD-10-CM | POA: Diagnosis not present

## 2016-09-17 DIAGNOSIS — D509 Iron deficiency anemia, unspecified: Secondary | ICD-10-CM | POA: Diagnosis not present

## 2016-09-17 DIAGNOSIS — N186 End stage renal disease: Secondary | ICD-10-CM | POA: Diagnosis not present

## 2016-09-17 DIAGNOSIS — D631 Anemia in chronic kidney disease: Secondary | ICD-10-CM | POA: Diagnosis not present

## 2016-09-19 DIAGNOSIS — D509 Iron deficiency anemia, unspecified: Secondary | ICD-10-CM | POA: Diagnosis not present

## 2016-09-19 DIAGNOSIS — N186 End stage renal disease: Secondary | ICD-10-CM | POA: Diagnosis not present

## 2016-09-19 DIAGNOSIS — Z992 Dependence on renal dialysis: Secondary | ICD-10-CM | POA: Diagnosis not present

## 2016-09-19 DIAGNOSIS — N2581 Secondary hyperparathyroidism of renal origin: Secondary | ICD-10-CM | POA: Diagnosis not present

## 2016-09-19 DIAGNOSIS — D631 Anemia in chronic kidney disease: Secondary | ICD-10-CM | POA: Diagnosis not present

## 2016-09-22 DIAGNOSIS — Z992 Dependence on renal dialysis: Secondary | ICD-10-CM | POA: Diagnosis not present

## 2016-09-22 DIAGNOSIS — N186 End stage renal disease: Secondary | ICD-10-CM | POA: Diagnosis not present

## 2016-09-22 DIAGNOSIS — N2581 Secondary hyperparathyroidism of renal origin: Secondary | ICD-10-CM | POA: Diagnosis not present

## 2016-09-22 DIAGNOSIS — D631 Anemia in chronic kidney disease: Secondary | ICD-10-CM | POA: Diagnosis not present

## 2016-09-22 DIAGNOSIS — D509 Iron deficiency anemia, unspecified: Secondary | ICD-10-CM | POA: Diagnosis not present

## 2016-09-24 DIAGNOSIS — D631 Anemia in chronic kidney disease: Secondary | ICD-10-CM | POA: Diagnosis not present

## 2016-09-24 DIAGNOSIS — D509 Iron deficiency anemia, unspecified: Secondary | ICD-10-CM | POA: Diagnosis not present

## 2016-09-24 DIAGNOSIS — Z992 Dependence on renal dialysis: Secondary | ICD-10-CM | POA: Diagnosis not present

## 2016-09-24 DIAGNOSIS — N186 End stage renal disease: Secondary | ICD-10-CM | POA: Diagnosis not present

## 2016-09-24 DIAGNOSIS — N2581 Secondary hyperparathyroidism of renal origin: Secondary | ICD-10-CM | POA: Diagnosis not present

## 2016-09-26 DIAGNOSIS — D631 Anemia in chronic kidney disease: Secondary | ICD-10-CM | POA: Diagnosis not present

## 2016-09-26 DIAGNOSIS — Z992 Dependence on renal dialysis: Secondary | ICD-10-CM | POA: Diagnosis not present

## 2016-09-26 DIAGNOSIS — N2581 Secondary hyperparathyroidism of renal origin: Secondary | ICD-10-CM | POA: Diagnosis not present

## 2016-09-26 DIAGNOSIS — N186 End stage renal disease: Secondary | ICD-10-CM | POA: Diagnosis not present

## 2016-09-26 DIAGNOSIS — D509 Iron deficiency anemia, unspecified: Secondary | ICD-10-CM | POA: Diagnosis not present

## 2016-09-29 DIAGNOSIS — N186 End stage renal disease: Secondary | ICD-10-CM | POA: Diagnosis not present

## 2016-09-29 DIAGNOSIS — Z992 Dependence on renal dialysis: Secondary | ICD-10-CM | POA: Diagnosis not present

## 2016-09-29 DIAGNOSIS — N2581 Secondary hyperparathyroidism of renal origin: Secondary | ICD-10-CM | POA: Diagnosis not present

## 2016-09-29 DIAGNOSIS — D631 Anemia in chronic kidney disease: Secondary | ICD-10-CM | POA: Diagnosis not present

## 2016-09-29 DIAGNOSIS — D509 Iron deficiency anemia, unspecified: Secondary | ICD-10-CM | POA: Diagnosis not present

## 2016-09-30 DIAGNOSIS — Z1389 Encounter for screening for other disorder: Secondary | ICD-10-CM | POA: Diagnosis not present

## 2016-09-30 DIAGNOSIS — Z7189 Other specified counseling: Secondary | ICD-10-CM | POA: Diagnosis not present

## 2016-09-30 DIAGNOSIS — E039 Hypothyroidism, unspecified: Secondary | ICD-10-CM | POA: Diagnosis not present

## 2016-09-30 DIAGNOSIS — Z125 Encounter for screening for malignant neoplasm of prostate: Secondary | ICD-10-CM | POA: Diagnosis not present

## 2016-09-30 DIAGNOSIS — Z992 Dependence on renal dialysis: Secondary | ICD-10-CM | POA: Diagnosis not present

## 2016-09-30 DIAGNOSIS — M461 Sacroiliitis, not elsewhere classified: Secondary | ICD-10-CM | POA: Diagnosis not present

## 2016-09-30 DIAGNOSIS — I4891 Unspecified atrial fibrillation: Secondary | ICD-10-CM | POA: Diagnosis not present

## 2016-09-30 DIAGNOSIS — I770 Arteriovenous fistula, acquired: Secondary | ICD-10-CM | POA: Diagnosis not present

## 2016-09-30 DIAGNOSIS — Z6827 Body mass index (BMI) 27.0-27.9, adult: Secondary | ICD-10-CM | POA: Diagnosis not present

## 2016-09-30 DIAGNOSIS — Z299 Encounter for prophylactic measures, unspecified: Secondary | ICD-10-CM | POA: Diagnosis not present

## 2016-09-30 DIAGNOSIS — I10 Essential (primary) hypertension: Secondary | ICD-10-CM | POA: Diagnosis not present

## 2016-09-30 DIAGNOSIS — Z79899 Other long term (current) drug therapy: Secondary | ICD-10-CM | POA: Diagnosis not present

## 2016-09-30 DIAGNOSIS — Z0000 Encounter for general adult medical examination without abnormal findings: Secondary | ICD-10-CM | POA: Diagnosis not present

## 2016-09-30 DIAGNOSIS — R5383 Other fatigue: Secondary | ICD-10-CM | POA: Diagnosis not present

## 2016-10-01 DIAGNOSIS — D509 Iron deficiency anemia, unspecified: Secondary | ICD-10-CM | POA: Diagnosis not present

## 2016-10-01 DIAGNOSIS — Z992 Dependence on renal dialysis: Secondary | ICD-10-CM | POA: Diagnosis not present

## 2016-10-01 DIAGNOSIS — N2581 Secondary hyperparathyroidism of renal origin: Secondary | ICD-10-CM | POA: Diagnosis not present

## 2016-10-01 DIAGNOSIS — D631 Anemia in chronic kidney disease: Secondary | ICD-10-CM | POA: Diagnosis not present

## 2016-10-01 DIAGNOSIS — N186 End stage renal disease: Secondary | ICD-10-CM | POA: Diagnosis not present

## 2016-10-03 DIAGNOSIS — Z992 Dependence on renal dialysis: Secondary | ICD-10-CM | POA: Diagnosis not present

## 2016-10-03 DIAGNOSIS — N2581 Secondary hyperparathyroidism of renal origin: Secondary | ICD-10-CM | POA: Diagnosis not present

## 2016-10-03 DIAGNOSIS — N186 End stage renal disease: Secondary | ICD-10-CM | POA: Diagnosis not present

## 2016-10-03 DIAGNOSIS — D509 Iron deficiency anemia, unspecified: Secondary | ICD-10-CM | POA: Diagnosis not present

## 2016-10-03 DIAGNOSIS — D631 Anemia in chronic kidney disease: Secondary | ICD-10-CM | POA: Diagnosis not present

## 2016-10-06 DIAGNOSIS — D509 Iron deficiency anemia, unspecified: Secondary | ICD-10-CM | POA: Diagnosis not present

## 2016-10-06 DIAGNOSIS — Z992 Dependence on renal dialysis: Secondary | ICD-10-CM | POA: Diagnosis not present

## 2016-10-06 DIAGNOSIS — N186 End stage renal disease: Secondary | ICD-10-CM | POA: Diagnosis not present

## 2016-10-06 DIAGNOSIS — N2581 Secondary hyperparathyroidism of renal origin: Secondary | ICD-10-CM | POA: Diagnosis not present

## 2016-10-06 DIAGNOSIS — D631 Anemia in chronic kidney disease: Secondary | ICD-10-CM | POA: Diagnosis not present

## 2016-10-08 DIAGNOSIS — N186 End stage renal disease: Secondary | ICD-10-CM | POA: Diagnosis not present

## 2016-10-08 DIAGNOSIS — D631 Anemia in chronic kidney disease: Secondary | ICD-10-CM | POA: Diagnosis not present

## 2016-10-08 DIAGNOSIS — N2581 Secondary hyperparathyroidism of renal origin: Secondary | ICD-10-CM | POA: Diagnosis not present

## 2016-10-08 DIAGNOSIS — Z992 Dependence on renal dialysis: Secondary | ICD-10-CM | POA: Diagnosis not present

## 2016-10-08 DIAGNOSIS — D509 Iron deficiency anemia, unspecified: Secondary | ICD-10-CM | POA: Diagnosis not present

## 2016-10-10 DIAGNOSIS — D509 Iron deficiency anemia, unspecified: Secondary | ICD-10-CM | POA: Diagnosis not present

## 2016-10-10 DIAGNOSIS — D631 Anemia in chronic kidney disease: Secondary | ICD-10-CM | POA: Diagnosis not present

## 2016-10-10 DIAGNOSIS — Z992 Dependence on renal dialysis: Secondary | ICD-10-CM | POA: Diagnosis not present

## 2016-10-10 DIAGNOSIS — N2581 Secondary hyperparathyroidism of renal origin: Secondary | ICD-10-CM | POA: Diagnosis not present

## 2016-10-10 DIAGNOSIS — N186 End stage renal disease: Secondary | ICD-10-CM | POA: Diagnosis not present

## 2016-10-13 DIAGNOSIS — D509 Iron deficiency anemia, unspecified: Secondary | ICD-10-CM | POA: Diagnosis not present

## 2016-10-13 DIAGNOSIS — N186 End stage renal disease: Secondary | ICD-10-CM | POA: Diagnosis not present

## 2016-10-13 DIAGNOSIS — D631 Anemia in chronic kidney disease: Secondary | ICD-10-CM | POA: Diagnosis not present

## 2016-10-13 DIAGNOSIS — Z992 Dependence on renal dialysis: Secondary | ICD-10-CM | POA: Diagnosis not present

## 2016-10-15 DIAGNOSIS — D509 Iron deficiency anemia, unspecified: Secondary | ICD-10-CM | POA: Diagnosis not present

## 2016-10-15 DIAGNOSIS — Z992 Dependence on renal dialysis: Secondary | ICD-10-CM | POA: Diagnosis not present

## 2016-10-15 DIAGNOSIS — N186 End stage renal disease: Secondary | ICD-10-CM | POA: Diagnosis not present

## 2016-10-15 DIAGNOSIS — D631 Anemia in chronic kidney disease: Secondary | ICD-10-CM | POA: Diagnosis not present

## 2016-10-17 DIAGNOSIS — Z992 Dependence on renal dialysis: Secondary | ICD-10-CM | POA: Diagnosis not present

## 2016-10-17 DIAGNOSIS — D509 Iron deficiency anemia, unspecified: Secondary | ICD-10-CM | POA: Diagnosis not present

## 2016-10-17 DIAGNOSIS — N186 End stage renal disease: Secondary | ICD-10-CM | POA: Diagnosis not present

## 2016-10-17 DIAGNOSIS — D631 Anemia in chronic kidney disease: Secondary | ICD-10-CM | POA: Diagnosis not present

## 2016-10-18 DIAGNOSIS — Z992 Dependence on renal dialysis: Secondary | ICD-10-CM | POA: Diagnosis not present

## 2016-10-18 DIAGNOSIS — N186 End stage renal disease: Secondary | ICD-10-CM | POA: Diagnosis not present

## 2016-10-20 DIAGNOSIS — D509 Iron deficiency anemia, unspecified: Secondary | ICD-10-CM | POA: Diagnosis not present

## 2016-10-20 DIAGNOSIS — D631 Anemia in chronic kidney disease: Secondary | ICD-10-CM | POA: Diagnosis not present

## 2016-10-20 DIAGNOSIS — Z992 Dependence on renal dialysis: Secondary | ICD-10-CM | POA: Diagnosis not present

## 2016-10-20 DIAGNOSIS — Z23 Encounter for immunization: Secondary | ICD-10-CM | POA: Diagnosis not present

## 2016-10-20 DIAGNOSIS — N186 End stage renal disease: Secondary | ICD-10-CM | POA: Diagnosis not present

## 2016-10-21 ENCOUNTER — Other Ambulatory Visit: Payer: Self-pay | Admitting: *Deleted

## 2016-10-21 ENCOUNTER — Telehealth: Payer: Self-pay | Admitting: *Deleted

## 2016-10-21 NOTE — Telephone Encounter (Signed)
Called daughter Mateo Flow and notified her of patient's appointment for [facility] removal. Light B'fast and to be at [facility] at 10:30 am for 11 am procedure 10/28/16. Verbalized understanding. Dialysis doctor and [facility] aware of this appointment also.

## 2016-10-22 ENCOUNTER — Encounter: Payer: Self-pay | Admitting: Family

## 2016-10-22 DIAGNOSIS — D631 Anemia in chronic kidney disease: Secondary | ICD-10-CM | POA: Diagnosis not present

## 2016-10-22 DIAGNOSIS — Z23 Encounter for immunization: Secondary | ICD-10-CM | POA: Diagnosis not present

## 2016-10-22 DIAGNOSIS — N186 End stage renal disease: Secondary | ICD-10-CM | POA: Diagnosis not present

## 2016-10-22 DIAGNOSIS — Z992 Dependence on renal dialysis: Secondary | ICD-10-CM | POA: Diagnosis not present

## 2016-10-22 DIAGNOSIS — D509 Iron deficiency anemia, unspecified: Secondary | ICD-10-CM | POA: Diagnosis not present

## 2016-10-22 NOTE — Telephone Encounter (Signed)
Dr. Lowanda Foster called to cancel this TDC removal. Pt is having pain when using venous side of radiocephalic fistula. We will see him in the morning with our NP for evaluation.

## 2016-10-23 ENCOUNTER — Encounter: Payer: Self-pay | Admitting: Family

## 2016-10-23 ENCOUNTER — Ambulatory Visit (INDEPENDENT_AMBULATORY_CARE_PROVIDER_SITE_OTHER): Payer: Medicare Other | Admitting: Family

## 2016-10-23 VITALS — BP 145/76 | HR 65 | Temp 98.0°F | Resp 20 | Ht 70.0 in | Wt 167.0 lb

## 2016-10-23 DIAGNOSIS — T82898A Other specified complication of vascular prosthetic devices, implants and grafts, initial encounter: Secondary | ICD-10-CM

## 2016-10-23 DIAGNOSIS — I2510 Atherosclerotic heart disease of native coronary artery without angina pectoris: Secondary | ICD-10-CM

## 2016-10-23 DIAGNOSIS — Z992 Dependence on renal dialysis: Secondary | ICD-10-CM | POA: Diagnosis not present

## 2016-10-23 DIAGNOSIS — N186 End stage renal disease: Secondary | ICD-10-CM

## 2016-10-23 DIAGNOSIS — I770 Arteriovenous fistula, acquired: Secondary | ICD-10-CM | POA: Diagnosis not present

## 2016-10-23 NOTE — Progress Notes (Signed)
CC: severe steal sx's right hand during dialysis  History of Present Illness  Dale Torres is a 83 y.o. (1925-10-03) male who had developed progressive severe steal syndrome in his left hand with a left upper arm AV fistula patent. He underwent ligation of the left upper arm AV fistula and placement of a right radiocephalic fistula on 32/44/0102. Also had placement of a left IJ catheter at that time. He saw Dr. Donnetta Hutching on 08-11-16 for follow-up. He reported that he is mild numbness in his left hand occasionally but no as no longer the severe pain and aching in his hand that made him consider discontinuing dialysis.He had in the past reported overall numbness in both upper extremities when he is on dialysis that resolved immediately with discontinuation of dialysis.  Dr. Donnetta Hutching last evaluated pt on 08-11-16. At that time there was a palpable left radial pulse and thrill, and occluded left upper arm AV fistula. His right radiocephalic fistula incision was healed and he had excellent maturation of his cephalic vein. It runs very superficial to the skin and is a good diameter dilatation and ran in a very straight course. Patient has had resolution of his left arm steal symptoms. Has excellent early maturation of his right arm fistula. Should be able to use this beginning in September 2018 and can have discontinuation of his catheter at that time.  Pt returns today with c/o unbearable pain in his right hand during dialysis. He states the staff are also having trouble with cannulation, and have to try several cannulation attempts. He also has a left upper chest catheter, and receives HD alternating between the right arm AVF and catheter. His right arm AV fistula started to be used in early September. He does not have pain in his hand at every HD session. He has the same steal sx's in his left hand eventually with the left upper arm access.    Pt had had access procedures performed by Dr. Scot Dock and Dr.  Trula Slade also, according to surgical hx.   Dr. Lowanda Foster is pt nephrologist.    Past Medical History:  Diagnosis Date  . Anemia    Dr. Sonny Dandy  . Arthritis   . Benign prostatic hypertrophy   . Carpal tunnel syndrome of left wrist   . Coronary atherosclerosis of native coronary artery    Nonobstructive 2009  . ESRD on hemodialysis ([place])   . Essential hypertension, benign   . GERD (gastroesophageal reflux disease)   . Headache   . History of pneumonia   . HOH (hard of hearing)    Bilateral  hearing aids  . Hypothyroidism   . Persistent atrial fibrillation (HCC)    Declines coumadin  . Presence of permanent cardiac pacemaker   . Sick sinus syndrome [facility])    Status post pacemaker placement 2008  . Skin cancer, basal cell     Social History Social History  Substance Use Topics  . Smoking status: Former Smoker    Packs/day: 0.80    Years: 12.00    Types: Cigarettes    Start date: 01/20/1948    Quit date: 01/20/1960  . Smokeless tobacco: Former Systems developer    Types: Chew    Quit date: 01/20/1980     Comment: chewed tobacco x's 10 years while playing golf only,used a pack per week  . Alcohol use 0.0 oz/week     Comment: Occasional glass of wine    Family History Family History  Problem Relation Age of Onset  .  Coronary artery disease Father   . Heart disease Father        after age 41  . Coronary artery disease Mother   . Heart disease Mother        After age 39  . Coronary artery disease Sister   . Heart disease Sister        After age 38    Surgical History Past Surgical History:  Procedure Laterality Date  . APPENDECTOMY    . AV FISTULA PLACEMENT Left 08/16/2014   Procedure: Left Arm creation of Brachiocephalic ARTERIOVENOUS (AV) FISTULA ;  Surgeon: Angelia Mould, MD;  Location: [place];  Service: Vascular;  Laterality: Left;  . AV FISTULA PLACEMENT Right 06/24/2016   Procedure: CREATION OF RIGHT RADIOCEPHALIC ARTERIOVENOUS (AV);  Surgeon: Rosetta Posner, MD;  Location:  [place];  Service: Vascular;  Laterality: Right;  . CHOLECYSTECTOMY    . COLONOSCOPY W/ BIOPSIES AND POLYPECTOMY    . EYE SURGERY    . INSERTION OF DIALYSIS CATHETER Left 06/24/2016   Procedure: INSERTION OF DIALYSIS CATHETER - left Internal Jugular placement;  Surgeon: Rosetta Posner, MD;  Location: [place];  Service: Vascular;  Laterality: Left;  . IR FLUORO GUIDE CV LINE RIGHT  08/06/2016  . LAMINECTOMY    . LIGATION OF ARTERIOVENOUS  FISTULA Left 06/24/2016   Procedure: LIGATION OF left arm  ARTERIOVENOUS  FISTULA;  Surgeon: Rosetta Posner, MD;  Location: [place];  Service: Vascular;  Laterality: Left;  . PACEMAKER GENERATOR CHANGE  03/03/11   MDT Adaptal L implanted by Dr Rayann Heman  . PACEMAKER PLACEMENT  2008   [employer] - Dr. Doreatha Lew  . PERIPHERAL VASCULAR CATHETERIZATION Left 03/06/2015   Procedure: Fistulagram;  Surgeon: Serafina Mitchell, MD;  Location: [place] CV LAB;  Service: Cardiovascular;  Laterality: Left;  . PERMANENT PACEMAKER GENERATOR CHANGE N/A 03/03/2011   Procedure: PERMANENT PACEMAKER GENERATOR CHANGE;  Surgeon: Thompson Grayer, MD;  Location: [facility] CATH LAB;  Service: Cardiovascular;  Laterality: N/A;  . Right rotator cuff repair    . THROMBECTOMY AND REVISION OF ARTERIOVENTOUS (AV) GORETEX  GRAFT Left 03/11/2015   Procedure: THROMBECTOMY AND REVISION OF ARTERIOVENTOUS (AV) GORETEX  GRAFT LEFT ARM;  Surgeon: Rosetta Posner, MD;  Location: [place];  Service: Vascular;  Laterality: Left;  . TONSILLECTOMY      Allergies  Allergen Reactions  . Oxycodone Hcl Rash    Current Outpatient Prescriptions  Medication Sig Dispense Refill  . amiodarone (PACERONE) 200 MG tablet Take 200 mg by mouth daily.    Marland Kitchen aspirin 81 MG tablet Take 81 mg by mouth at bedtime.     Marland Kitchen atenolol (TENORMIN) 25 MG tablet Take 12.5 mg by mouth daily.    Marland Kitchen docusate sodium (COLACE) 100 MG capsule Take 100 mg by mouth 2 (two) times daily.     . folic acid (FOLVITE) 1 MG tablet Take 1 mg by mouth daily.      Marland Kitchen gabapentin  (NEURONTIN) 400 MG capsule Take 400 mg by mouth at bedtime.     Marland Kitchen HYDROcodone-acetaminophen (NORCO) 10-325 MG tablet Take 1 tablet by mouth every 6 (six) hours as needed for severe pain. 15 tablet 0  . levothyroxine (SYNTHROID, LEVOTHROID) 125 MCG tablet Take 125 mcg by mouth daily before breakfast.    . MELATONIN PO Take 1 tablet by mouth at bedtime.    . midodrine (PROAMATINE) 10 MG tablet Take 10 mg by mouth 2 (two) times daily.     Marland Kitchen  nitroGLYCERIN (NITROSTAT) 0.4 MG SL tablet Place 1 tablet (0.4 mg total) under the tongue every 5 (five) minutes as needed. (Patient taking differently: Place 0.4 mg under the tongue every 5 (five) minutes as needed for chest pain. ) 25 tablet 2  . omeprazole (PRILOSEC) 20 MG capsule Take 20 mg by mouth daily.      . sevelamer carbonate (RENVELA) 800 MG tablet Take 800-3,200 mg by mouth. Take 3200mg  with each meal and 800mg  with each snack    . tamsulosin (FLOMAX) 0.4 MG CAPS capsule Take 0.4 mg by mouth at bedtime.     No current facility-administered medications for this visit.      REVIEW OF SYSTEMS: see HPI for pertinent positives and negatives    PHYSICAL EXAMINATION:  Vitals:   10/23/16 0945  BP: (!) 145/76  Pulse: 65  Resp: 20  Temp: 98 F (36.7 C)  TempSrc: Oral  SpO2: 97%  Weight: 167 lb (75.8 kg)  Height: 5\' 10"  (1.778 m)   Body mass index is 23.96 kg/m.  General: The patient appears their stated age.   HEENT:  No gross abnormalities Pulmonary: Respirations are non-labored, BBS CTAB.  Abdomen: Soft and non-tender with normal pitched bowel sounds.  Musculoskeletal: There are no major deformities.   Neurologic: No focal weakness or paresthesias are detected, pt is hard of hearing.  Skin: There are no ulcer or rashes noted. Psychiatric: The patient has normal affect. Cardiovascular: There is a regular rate and rhythm without significant murmur appreciated. Right upper chest pacemaker palpated. Left upper chest HD catheter. Right  forearm AV fistula with palpable thrill and audible bruit. Bilateral radial pulses are 2+ palpable.     Medical Decision Making  Dale Torres is a 83 y.o. male who had developed progressive severe steal syndrome in his left hand with a left upper arm AV fistula patent. He underwent ligation of the left upper arm AV fistula and placement of a right radiocephalic fistula on 54/65/0354. Also had placement of a left IJ catheter at that time.  Dr. Donzetta Matters spoke with pt and daughter and evaluated pt.  Pt will try wrapping right arm and hand in warm blanket, sx relief during HD, as his other access creation options are limited.   Dr. Donzetta Matters will speak with Dr. Hinda Lenis about options.  Pt will return in a month, see Dr. Donzetta Matters,  for re evaluation of HD access options, sooner if needed.    NICKEL, Sharmon Leyden, RN, MSN, FNP-C [facility] [place] Office: [phone]  10/23/2016, 10:33 AM  Clinic MD: Chen/Cain

## 2016-10-24 DIAGNOSIS — N186 End stage renal disease: Secondary | ICD-10-CM | POA: Diagnosis not present

## 2016-10-24 DIAGNOSIS — D631 Anemia in chronic kidney disease: Secondary | ICD-10-CM | POA: Diagnosis not present

## 2016-10-24 DIAGNOSIS — D509 Iron deficiency anemia, unspecified: Secondary | ICD-10-CM | POA: Diagnosis not present

## 2016-10-24 DIAGNOSIS — Z992 Dependence on renal dialysis: Secondary | ICD-10-CM | POA: Diagnosis not present

## 2016-10-24 DIAGNOSIS — Z23 Encounter for immunization: Secondary | ICD-10-CM | POA: Diagnosis not present

## 2016-10-27 DIAGNOSIS — D631 Anemia in chronic kidney disease: Secondary | ICD-10-CM | POA: Diagnosis not present

## 2016-10-27 DIAGNOSIS — D509 Iron deficiency anemia, unspecified: Secondary | ICD-10-CM | POA: Diagnosis not present

## 2016-10-27 DIAGNOSIS — Z23 Encounter for immunization: Secondary | ICD-10-CM | POA: Diagnosis not present

## 2016-10-27 DIAGNOSIS — N186 End stage renal disease: Secondary | ICD-10-CM | POA: Diagnosis not present

## 2016-10-27 DIAGNOSIS — Z992 Dependence on renal dialysis: Secondary | ICD-10-CM | POA: Diagnosis not present

## 2016-10-28 ENCOUNTER — Encounter (HOSPITAL_COMMUNITY): Payer: Medicare Other

## 2016-10-29 DIAGNOSIS — N186 End stage renal disease: Secondary | ICD-10-CM | POA: Diagnosis not present

## 2016-10-29 DIAGNOSIS — Z23 Encounter for immunization: Secondary | ICD-10-CM | POA: Diagnosis not present

## 2016-10-29 DIAGNOSIS — D509 Iron deficiency anemia, unspecified: Secondary | ICD-10-CM | POA: Diagnosis not present

## 2016-10-29 DIAGNOSIS — D631 Anemia in chronic kidney disease: Secondary | ICD-10-CM | POA: Diagnosis not present

## 2016-10-29 DIAGNOSIS — Z992 Dependence on renal dialysis: Secondary | ICD-10-CM | POA: Diagnosis not present

## 2016-10-31 DIAGNOSIS — N186 End stage renal disease: Secondary | ICD-10-CM | POA: Diagnosis not present

## 2016-10-31 DIAGNOSIS — D509 Iron deficiency anemia, unspecified: Secondary | ICD-10-CM | POA: Diagnosis not present

## 2016-10-31 DIAGNOSIS — Z992 Dependence on renal dialysis: Secondary | ICD-10-CM | POA: Diagnosis not present

## 2016-10-31 DIAGNOSIS — Z23 Encounter for immunization: Secondary | ICD-10-CM | POA: Diagnosis not present

## 2016-10-31 DIAGNOSIS — D631 Anemia in chronic kidney disease: Secondary | ICD-10-CM | POA: Diagnosis not present

## 2016-11-03 DIAGNOSIS — Z992 Dependence on renal dialysis: Secondary | ICD-10-CM | POA: Diagnosis not present

## 2016-11-03 DIAGNOSIS — D631 Anemia in chronic kidney disease: Secondary | ICD-10-CM | POA: Diagnosis not present

## 2016-11-03 DIAGNOSIS — Z23 Encounter for immunization: Secondary | ICD-10-CM | POA: Diagnosis not present

## 2016-11-03 DIAGNOSIS — D509 Iron deficiency anemia, unspecified: Secondary | ICD-10-CM | POA: Diagnosis not present

## 2016-11-03 DIAGNOSIS — N186 End stage renal disease: Secondary | ICD-10-CM | POA: Diagnosis not present

## 2016-11-05 DIAGNOSIS — Z992 Dependence on renal dialysis: Secondary | ICD-10-CM | POA: Diagnosis not present

## 2016-11-05 DIAGNOSIS — D509 Iron deficiency anemia, unspecified: Secondary | ICD-10-CM | POA: Diagnosis not present

## 2016-11-05 DIAGNOSIS — Z23 Encounter for immunization: Secondary | ICD-10-CM | POA: Diagnosis not present

## 2016-11-05 DIAGNOSIS — D631 Anemia in chronic kidney disease: Secondary | ICD-10-CM | POA: Diagnosis not present

## 2016-11-05 DIAGNOSIS — N186 End stage renal disease: Secondary | ICD-10-CM | POA: Diagnosis not present

## 2016-11-06 DIAGNOSIS — I770 Arteriovenous fistula, acquired: Secondary | ICD-10-CM | POA: Diagnosis not present

## 2016-11-06 DIAGNOSIS — R972 Elevated prostate specific antigen [PSA]: Secondary | ICD-10-CM | POA: Diagnosis not present

## 2016-11-06 DIAGNOSIS — I10 Essential (primary) hypertension: Secondary | ICD-10-CM | POA: Diagnosis not present

## 2016-11-06 DIAGNOSIS — Z6827 Body mass index (BMI) 27.0-27.9, adult: Secondary | ICD-10-CM | POA: Diagnosis not present

## 2016-11-06 DIAGNOSIS — I4891 Unspecified atrial fibrillation: Secondary | ICD-10-CM | POA: Diagnosis not present

## 2016-11-06 DIAGNOSIS — Z299 Encounter for prophylactic measures, unspecified: Secondary | ICD-10-CM | POA: Diagnosis not present

## 2016-11-07 DIAGNOSIS — D631 Anemia in chronic kidney disease: Secondary | ICD-10-CM | POA: Diagnosis not present

## 2016-11-07 DIAGNOSIS — Z23 Encounter for immunization: Secondary | ICD-10-CM | POA: Diagnosis not present

## 2016-11-07 DIAGNOSIS — N186 End stage renal disease: Secondary | ICD-10-CM | POA: Diagnosis not present

## 2016-11-07 DIAGNOSIS — Z992 Dependence on renal dialysis: Secondary | ICD-10-CM | POA: Diagnosis not present

## 2016-11-07 DIAGNOSIS — D509 Iron deficiency anemia, unspecified: Secondary | ICD-10-CM | POA: Diagnosis not present

## 2016-11-10 DIAGNOSIS — Z992 Dependence on renal dialysis: Secondary | ICD-10-CM | POA: Diagnosis not present

## 2016-11-10 DIAGNOSIS — D509 Iron deficiency anemia, unspecified: Secondary | ICD-10-CM | POA: Diagnosis not present

## 2016-11-10 DIAGNOSIS — N186 End stage renal disease: Secondary | ICD-10-CM | POA: Diagnosis not present

## 2016-11-10 DIAGNOSIS — D631 Anemia in chronic kidney disease: Secondary | ICD-10-CM | POA: Diagnosis not present

## 2016-11-10 DIAGNOSIS — Z23 Encounter for immunization: Secondary | ICD-10-CM | POA: Diagnosis not present

## 2016-11-12 DIAGNOSIS — Z23 Encounter for immunization: Secondary | ICD-10-CM | POA: Diagnosis not present

## 2016-11-12 DIAGNOSIS — D509 Iron deficiency anemia, unspecified: Secondary | ICD-10-CM | POA: Diagnosis not present

## 2016-11-12 DIAGNOSIS — D631 Anemia in chronic kidney disease: Secondary | ICD-10-CM | POA: Diagnosis not present

## 2016-11-12 DIAGNOSIS — N186 End stage renal disease: Secondary | ICD-10-CM | POA: Diagnosis not present

## 2016-11-12 DIAGNOSIS — Z992 Dependence on renal dialysis: Secondary | ICD-10-CM | POA: Diagnosis not present

## 2016-11-14 DIAGNOSIS — Z992 Dependence on renal dialysis: Secondary | ICD-10-CM | POA: Diagnosis not present

## 2016-11-14 DIAGNOSIS — Z23 Encounter for immunization: Secondary | ICD-10-CM | POA: Diagnosis not present

## 2016-11-14 DIAGNOSIS — N186 End stage renal disease: Secondary | ICD-10-CM | POA: Diagnosis not present

## 2016-11-14 DIAGNOSIS — D509 Iron deficiency anemia, unspecified: Secondary | ICD-10-CM | POA: Diagnosis not present

## 2016-11-14 DIAGNOSIS — D631 Anemia in chronic kidney disease: Secondary | ICD-10-CM | POA: Diagnosis not present

## 2016-11-17 DIAGNOSIS — D631 Anemia in chronic kidney disease: Secondary | ICD-10-CM | POA: Diagnosis not present

## 2016-11-17 DIAGNOSIS — D509 Iron deficiency anemia, unspecified: Secondary | ICD-10-CM | POA: Diagnosis not present

## 2016-11-17 DIAGNOSIS — Z23 Encounter for immunization: Secondary | ICD-10-CM | POA: Diagnosis not present

## 2016-11-17 DIAGNOSIS — Z992 Dependence on renal dialysis: Secondary | ICD-10-CM | POA: Diagnosis not present

## 2016-11-17 DIAGNOSIS — N186 End stage renal disease: Secondary | ICD-10-CM | POA: Diagnosis not present

## 2016-11-19 DIAGNOSIS — N186 End stage renal disease: Secondary | ICD-10-CM | POA: Diagnosis not present

## 2016-11-19 DIAGNOSIS — D631 Anemia in chronic kidney disease: Secondary | ICD-10-CM | POA: Diagnosis not present

## 2016-11-19 DIAGNOSIS — D509 Iron deficiency anemia, unspecified: Secondary | ICD-10-CM | POA: Diagnosis not present

## 2016-11-19 DIAGNOSIS — Z992 Dependence on renal dialysis: Secondary | ICD-10-CM | POA: Diagnosis not present

## 2016-11-21 DIAGNOSIS — D631 Anemia in chronic kidney disease: Secondary | ICD-10-CM | POA: Diagnosis not present

## 2016-11-21 DIAGNOSIS — N186 End stage renal disease: Secondary | ICD-10-CM | POA: Diagnosis not present

## 2016-11-21 DIAGNOSIS — Z992 Dependence on renal dialysis: Secondary | ICD-10-CM | POA: Diagnosis not present

## 2016-11-21 DIAGNOSIS — D509 Iron deficiency anemia, unspecified: Secondary | ICD-10-CM | POA: Diagnosis not present

## 2016-11-24 DIAGNOSIS — Z992 Dependence on renal dialysis: Secondary | ICD-10-CM | POA: Diagnosis not present

## 2016-11-24 DIAGNOSIS — D509 Iron deficiency anemia, unspecified: Secondary | ICD-10-CM | POA: Diagnosis not present

## 2016-11-24 DIAGNOSIS — D631 Anemia in chronic kidney disease: Secondary | ICD-10-CM | POA: Diagnosis not present

## 2016-11-24 DIAGNOSIS — N186 End stage renal disease: Secondary | ICD-10-CM | POA: Diagnosis not present

## 2016-11-26 DIAGNOSIS — N186 End stage renal disease: Secondary | ICD-10-CM | POA: Diagnosis not present

## 2016-11-26 DIAGNOSIS — D631 Anemia in chronic kidney disease: Secondary | ICD-10-CM | POA: Diagnosis not present

## 2016-11-26 DIAGNOSIS — Z992 Dependence on renal dialysis: Secondary | ICD-10-CM | POA: Diagnosis not present

## 2016-11-26 DIAGNOSIS — D509 Iron deficiency anemia, unspecified: Secondary | ICD-10-CM | POA: Diagnosis not present

## 2016-11-27 ENCOUNTER — Ambulatory Visit: Payer: Medicare Other | Admitting: Vascular Surgery

## 2016-11-28 DIAGNOSIS — D509 Iron deficiency anemia, unspecified: Secondary | ICD-10-CM | POA: Diagnosis not present

## 2016-11-28 DIAGNOSIS — Z992 Dependence on renal dialysis: Secondary | ICD-10-CM | POA: Diagnosis not present

## 2016-11-28 DIAGNOSIS — N186 End stage renal disease: Secondary | ICD-10-CM | POA: Diagnosis not present

## 2016-11-28 DIAGNOSIS — D631 Anemia in chronic kidney disease: Secondary | ICD-10-CM | POA: Diagnosis not present

## 2016-12-01 ENCOUNTER — Telehealth: Payer: Self-pay | Admitting: Cardiology

## 2016-12-01 ENCOUNTER — Encounter: Payer: Medicare Other | Admitting: *Deleted

## 2016-12-01 DIAGNOSIS — D509 Iron deficiency anemia, unspecified: Secondary | ICD-10-CM | POA: Diagnosis not present

## 2016-12-01 DIAGNOSIS — Z992 Dependence on renal dialysis: Secondary | ICD-10-CM | POA: Diagnosis not present

## 2016-12-01 DIAGNOSIS — D631 Anemia in chronic kidney disease: Secondary | ICD-10-CM | POA: Diagnosis not present

## 2016-12-01 DIAGNOSIS — N186 End stage renal disease: Secondary | ICD-10-CM | POA: Diagnosis not present

## 2016-12-01 NOTE — Telephone Encounter (Signed)
LMOVM reminding pt to send remote transmission.   

## 2016-12-03 ENCOUNTER — Encounter: Payer: Self-pay | Admitting: Cardiology

## 2016-12-03 DIAGNOSIS — D509 Iron deficiency anemia, unspecified: Secondary | ICD-10-CM | POA: Diagnosis not present

## 2016-12-03 DIAGNOSIS — D631 Anemia in chronic kidney disease: Secondary | ICD-10-CM | POA: Diagnosis not present

## 2016-12-03 DIAGNOSIS — N186 End stage renal disease: Secondary | ICD-10-CM | POA: Diagnosis not present

## 2016-12-03 DIAGNOSIS — Z992 Dependence on renal dialysis: Secondary | ICD-10-CM | POA: Diagnosis not present

## 2016-12-05 DIAGNOSIS — D631 Anemia in chronic kidney disease: Secondary | ICD-10-CM | POA: Diagnosis not present

## 2016-12-05 DIAGNOSIS — N186 End stage renal disease: Secondary | ICD-10-CM | POA: Diagnosis not present

## 2016-12-05 DIAGNOSIS — Z992 Dependence on renal dialysis: Secondary | ICD-10-CM | POA: Diagnosis not present

## 2016-12-05 DIAGNOSIS — D509 Iron deficiency anemia, unspecified: Secondary | ICD-10-CM | POA: Diagnosis not present

## 2016-12-07 DIAGNOSIS — I770 Arteriovenous fistula, acquired: Secondary | ICD-10-CM | POA: Diagnosis not present

## 2016-12-07 DIAGNOSIS — M461 Sacroiliitis, not elsewhere classified: Secondary | ICD-10-CM | POA: Diagnosis not present

## 2016-12-07 DIAGNOSIS — I10 Essential (primary) hypertension: Secondary | ICD-10-CM | POA: Diagnosis not present

## 2016-12-07 DIAGNOSIS — Z6828 Body mass index (BMI) 28.0-28.9, adult: Secondary | ICD-10-CM | POA: Diagnosis not present

## 2016-12-07 DIAGNOSIS — Z299 Encounter for prophylactic measures, unspecified: Secondary | ICD-10-CM | POA: Diagnosis not present

## 2016-12-08 DIAGNOSIS — D631 Anemia in chronic kidney disease: Secondary | ICD-10-CM | POA: Diagnosis not present

## 2016-12-08 DIAGNOSIS — N186 End stage renal disease: Secondary | ICD-10-CM | POA: Diagnosis not present

## 2016-12-08 DIAGNOSIS — Z992 Dependence on renal dialysis: Secondary | ICD-10-CM | POA: Diagnosis not present

## 2016-12-08 DIAGNOSIS — D509 Iron deficiency anemia, unspecified: Secondary | ICD-10-CM | POA: Diagnosis not present

## 2016-12-10 DIAGNOSIS — D509 Iron deficiency anemia, unspecified: Secondary | ICD-10-CM | POA: Diagnosis not present

## 2016-12-10 DIAGNOSIS — N186 End stage renal disease: Secondary | ICD-10-CM | POA: Diagnosis not present

## 2016-12-10 DIAGNOSIS — Z992 Dependence on renal dialysis: Secondary | ICD-10-CM | POA: Diagnosis not present

## 2016-12-10 DIAGNOSIS — D631 Anemia in chronic kidney disease: Secondary | ICD-10-CM | POA: Diagnosis not present

## 2016-12-12 DIAGNOSIS — N186 End stage renal disease: Secondary | ICD-10-CM | POA: Diagnosis not present

## 2016-12-12 DIAGNOSIS — D631 Anemia in chronic kidney disease: Secondary | ICD-10-CM | POA: Diagnosis not present

## 2016-12-12 DIAGNOSIS — Z992 Dependence on renal dialysis: Secondary | ICD-10-CM | POA: Diagnosis not present

## 2016-12-12 DIAGNOSIS — D509 Iron deficiency anemia, unspecified: Secondary | ICD-10-CM | POA: Diagnosis not present

## 2016-12-15 DIAGNOSIS — Z992 Dependence on renal dialysis: Secondary | ICD-10-CM | POA: Diagnosis not present

## 2016-12-15 DIAGNOSIS — N186 End stage renal disease: Secondary | ICD-10-CM | POA: Diagnosis not present

## 2016-12-15 DIAGNOSIS — D509 Iron deficiency anemia, unspecified: Secondary | ICD-10-CM | POA: Diagnosis not present

## 2016-12-15 DIAGNOSIS — D631 Anemia in chronic kidney disease: Secondary | ICD-10-CM | POA: Diagnosis not present

## 2016-12-17 DIAGNOSIS — D509 Iron deficiency anemia, unspecified: Secondary | ICD-10-CM | POA: Diagnosis not present

## 2016-12-17 DIAGNOSIS — N186 End stage renal disease: Secondary | ICD-10-CM | POA: Diagnosis not present

## 2016-12-17 DIAGNOSIS — D631 Anemia in chronic kidney disease: Secondary | ICD-10-CM | POA: Diagnosis not present

## 2016-12-17 DIAGNOSIS — Z992 Dependence on renal dialysis: Secondary | ICD-10-CM | POA: Diagnosis not present

## 2016-12-18 DIAGNOSIS — Z992 Dependence on renal dialysis: Secondary | ICD-10-CM | POA: Diagnosis not present

## 2016-12-18 DIAGNOSIS — N186 End stage renal disease: Secondary | ICD-10-CM | POA: Diagnosis not present

## 2016-12-19 DIAGNOSIS — N186 End stage renal disease: Secondary | ICD-10-CM | POA: Diagnosis not present

## 2016-12-19 DIAGNOSIS — D509 Iron deficiency anemia, unspecified: Secondary | ICD-10-CM | POA: Diagnosis not present

## 2016-12-19 DIAGNOSIS — Z992 Dependence on renal dialysis: Secondary | ICD-10-CM | POA: Diagnosis not present

## 2016-12-19 DIAGNOSIS — N2581 Secondary hyperparathyroidism of renal origin: Secondary | ICD-10-CM | POA: Diagnosis not present

## 2016-12-19 DIAGNOSIS — D631 Anemia in chronic kidney disease: Secondary | ICD-10-CM | POA: Diagnosis not present

## 2016-12-22 ENCOUNTER — Ambulatory Visit (INDEPENDENT_AMBULATORY_CARE_PROVIDER_SITE_OTHER): Payer: Medicare Other | Admitting: *Deleted

## 2016-12-22 DIAGNOSIS — I495 Sick sinus syndrome: Secondary | ICD-10-CM

## 2016-12-22 DIAGNOSIS — N2581 Secondary hyperparathyroidism of renal origin: Secondary | ICD-10-CM | POA: Diagnosis not present

## 2016-12-22 DIAGNOSIS — Z992 Dependence on renal dialysis: Secondary | ICD-10-CM | POA: Diagnosis not present

## 2016-12-22 DIAGNOSIS — N186 End stage renal disease: Secondary | ICD-10-CM | POA: Diagnosis not present

## 2016-12-22 DIAGNOSIS — D631 Anemia in chronic kidney disease: Secondary | ICD-10-CM | POA: Diagnosis not present

## 2016-12-22 DIAGNOSIS — D509 Iron deficiency anemia, unspecified: Secondary | ICD-10-CM | POA: Diagnosis not present

## 2016-12-23 ENCOUNTER — Encounter: Payer: Self-pay | Admitting: Cardiology

## 2016-12-23 LAB — CUP PACEART REMOTE DEVICE CHECK
Battery Impedance: 234 Ohm
Battery Voltage: 2.79 V
Brady Statistic AP VP Percent: 0 %
Brady Statistic AP VS Percent: 99 %
Brady Statistic AS VS Percent: 1 %
Implantable Lead Implant Date: 20080604
Implantable Lead Location: 753859
Implantable Lead Serial Number: 601713
Lead Channel Impedance Value: 411 Ohm
Lead Channel Impedance Value: 464 Ohm
Lead Channel Pacing Threshold Pulse Width: 0.4 ms
Lead Channel Setting Pacing Amplitude: 2.5 V
Lead Channel Setting Pacing Pulse Width: 0.4 ms
Lead Channel Setting Sensing Sensitivity: 2.8 mV
MDC IDC LEAD IMPLANT DT: 20080604
MDC IDC LEAD LOCATION: 753860
MDC IDC LEAD SERIAL: 498205
MDC IDC MSMT BATTERY REMAINING LONGEVITY: 114 mo
MDC IDC MSMT LEADCHNL RA PACING THRESHOLD AMPLITUDE: 0.625 V
MDC IDC MSMT LEADCHNL RV PACING THRESHOLD AMPLITUDE: 0.5 V
MDC IDC MSMT LEADCHNL RV PACING THRESHOLD PULSEWIDTH: 0.4 ms
MDC IDC PG IMPLANT DT: 20130212
MDC IDC SESS DTM: 20181204230146
MDC IDC SET LEADCHNL RA PACING AMPLITUDE: 2 V
MDC IDC STAT BRADY AS VP PERCENT: 0 %

## 2016-12-23 NOTE — Progress Notes (Signed)
Remote pacemaker transmission.   

## 2016-12-24 DIAGNOSIS — D631 Anemia in chronic kidney disease: Secondary | ICD-10-CM | POA: Diagnosis not present

## 2016-12-24 DIAGNOSIS — N2581 Secondary hyperparathyroidism of renal origin: Secondary | ICD-10-CM | POA: Diagnosis not present

## 2016-12-24 DIAGNOSIS — D509 Iron deficiency anemia, unspecified: Secondary | ICD-10-CM | POA: Diagnosis not present

## 2016-12-24 DIAGNOSIS — Z992 Dependence on renal dialysis: Secondary | ICD-10-CM | POA: Diagnosis not present

## 2016-12-24 DIAGNOSIS — N186 End stage renal disease: Secondary | ICD-10-CM | POA: Diagnosis not present

## 2016-12-25 ENCOUNTER — Encounter: Payer: Self-pay | Admitting: *Deleted

## 2016-12-25 ENCOUNTER — Encounter: Payer: Self-pay | Admitting: Vascular Surgery

## 2016-12-25 ENCOUNTER — Ambulatory Visit (INDEPENDENT_AMBULATORY_CARE_PROVIDER_SITE_OTHER): Payer: Medicare Other | Admitting: Vascular Surgery

## 2016-12-25 VITALS — BP 116/60 | HR 62 | Temp 96.6°F | Resp 20 | Ht 70.0 in | Wt 169.2 lb

## 2016-12-25 DIAGNOSIS — Z992 Dependence on renal dialysis: Secondary | ICD-10-CM

## 2016-12-25 DIAGNOSIS — N186 End stage renal disease: Secondary | ICD-10-CM

## 2016-12-25 NOTE — Progress Notes (Signed)
Patient ID: Dale Torres, male   DOB: [birth year], 83 y.o.   MRN: [id number]  Reason for Consult: Chronic Kidney Disease (1 mth f/u to re-eval Steal Syndrome, options for HD access.  Numbness in right arm in the mornings. )   Referred by Monico Blitz, MD  Subjective:     HPI:  Dale Torres is a 83 y.o. male on dialysis via right arm radiocephalic AV fistula.  He previous had a left upper arm AV fistula was ligated and was converted to right side.  He currently dialyzes via left IJ tunneled catheter.  He has pain in his right hand mostly with dialysis.  The pain is excruciating in nature and has been unrelieved with pain medicine or warming.  He does take meticulous care of his catheter is hopeful this will last.  He has also had some discussion about peritoneal dialysis but is yet to switch over.  He does not have any tissue loss or ulceration on the right hand.  Past Medical History:  Diagnosis Date  . Anemia    Dr. Sonny Dandy  . Arthritis   . Benign prostatic hypertrophy   . Carpal tunnel syndrome of left wrist   . Coronary atherosclerosis of native coronary artery    Nonobstructive 2009  . ESRD on hemodialysis ([place])   . Essential hypertension, benign   . GERD (gastroesophageal reflux disease)   . Headache   . History of pneumonia   . HOH (hard of hearing)    Bilateral  hearing aids  . Hypothyroidism   . Persistent atrial fibrillation (HCC)    Declines coumadin  . Presence of permanent cardiac pacemaker   . Sick sinus syndrome [facility])    Status post pacemaker placement 2008  . Skin cancer, basal cell    Family History  Problem Relation Age of Onset  . Coronary artery disease Father   . Heart disease Father        after age 55  . Coronary artery disease Mother   . Heart disease Mother        After age 80  . Coronary artery disease Sister   . Heart disease Sister        After age 40   Past Surgical History:  Procedure Laterality Date  . APPENDECTOMY    . AV FISTULA  PLACEMENT Left 08/16/2014   Procedure: Left Arm creation of Brachiocephalic ARTERIOVENOUS (AV) FISTULA ;  Surgeon: Angelia Mould, MD;  Location: [place];  Service: Vascular;  Laterality: Left;  . AV FISTULA PLACEMENT Right 06/24/2016   Procedure: CREATION OF RIGHT RADIOCEPHALIC ARTERIOVENOUS (AV);  Surgeon: Rosetta Posner, MD;  Location: [place];  Service: Vascular;  Laterality: Right;  . CHOLECYSTECTOMY    . COLONOSCOPY W/ BIOPSIES AND POLYPECTOMY    . EYE SURGERY    . INSERTION OF DIALYSIS CATHETER Left 06/24/2016   Procedure: INSERTION OF DIALYSIS CATHETER - left Internal Jugular placement;  Surgeon: Rosetta Posner, MD;  Location: [place];  Service: Vascular;  Laterality: Left;  . IR FLUORO GUIDE CV LINE RIGHT  08/06/2016  . LAMINECTOMY    . LIGATION OF ARTERIOVENOUS  FISTULA Left 06/24/2016   Procedure: LIGATION OF left arm  ARTERIOVENOUS  FISTULA;  Surgeon: Rosetta Posner, MD;  Location: [place];  Service: Vascular;  Laterality: Left;  . PACEMAKER GENERATOR CHANGE  03/03/11   MDT Adaptal L implanted by Dr Rayann Heman  . PACEMAKER PLACEMENT  2008   [employer] - Dr. Doreatha Lew  .  PERIPHERAL VASCULAR CATHETERIZATION Left 03/06/2015   Procedure: Fistulagram;  Surgeon: Serafina Mitchell, MD;  Location: [place] CV LAB;  Service: Cardiovascular;  Laterality: Left;  . PERMANENT PACEMAKER GENERATOR CHANGE N/A 03/03/2011   Procedure: PERMANENT PACEMAKER GENERATOR CHANGE;  Surgeon: Thompson Grayer, MD;  Location: [facility] CATH LAB;  Service: Cardiovascular;  Laterality: N/A;  . Right rotator cuff repair    . THROMBECTOMY AND REVISION OF ARTERIOVENTOUS (AV) GORETEX  GRAFT Left 03/11/2015   Procedure: THROMBECTOMY AND REVISION OF ARTERIOVENTOUS (AV) GORETEX  GRAFT LEFT ARM;  Surgeon: Rosetta Posner, MD;  Location: [place];  Service: Vascular;  Laterality: Left;  . TONSILLECTOMY      Short Social History:  Social History   Tobacco Use  . Smoking status: Former Smoker    Packs/day: 0.80    Years: 12.00    Pack years: 9.60     Types: Cigarettes    Start date: 01/20/1948    Last attempt to quit: 01/20/1960    Years since quitting: 56.9  . Smokeless tobacco: Former Systems developer    Types: Chew    Quit date: 01/20/1980  . Tobacco comment: chewed tobacco x's 10 years while playing golf only,used a pack per week  Substance Use Topics  . Alcohol use: Yes    Alcohol/week: 0.0 oz    Comment: Occasional glass of wine    Allergies  Allergen Reactions  . Oxycodone Hcl Rash    Current Outpatient Medications  Medication Sig Dispense Refill  . amiodarone (PACERONE) 200 MG tablet Take 200 mg by mouth daily.    Marland Kitchen aspirin 81 MG tablet Take 81 mg by mouth at bedtime.     Marland Kitchen atenolol (TENORMIN) 25 MG tablet Take 12.5 mg by mouth daily.    Marland Kitchen docusate sodium (COLACE) 100 MG capsule Take 100 mg by mouth 2 (two) times daily.     . folic acid (FOLVITE) 1 MG tablet Take 1 mg by mouth daily.      Marland Kitchen gabapentin (NEURONTIN) 400 MG capsule Take 400 mg by mouth at bedtime.     Marland Kitchen HYDROcodone-acetaminophen (NORCO) 10-325 MG tablet Take 1 tablet by mouth every 6 (six) hours as needed for severe pain. 15 tablet 0  . levothyroxine (SYNTHROID, LEVOTHROID) 125 MCG tablet Take 125 mcg by mouth daily before breakfast.    . MELATONIN PO Take 1 tablet by mouth at bedtime.    . midodrine (PROAMATINE) 10 MG tablet Take 10 mg by mouth 2 (two) times daily.     . nitroGLYCERIN (NITROSTAT) 0.4 MG SL tablet Place 1 tablet (0.4 mg total) under the tongue every 5 (five) minutes as needed. (Patient taking differently: Place 0.4 mg under the tongue every 5 (five) minutes as needed for chest pain. ) 25 tablet 2  . omeprazole (PRILOSEC) 20 MG capsule Take 20 mg by mouth daily.      . sevelamer carbonate (RENVELA) 800 MG tablet Take 800-3,200 mg by mouth. Take 3200mg  with each meal and 800mg  with each snack    . tamsulosin (FLOMAX) 0.4 MG CAPS capsule Take 0.4 mg by mouth at bedtime.     No current facility-administered medications for this visit.     Review of Systems    Constitutional:  Constitutional negative. HENT: HENT negative.  Eyes: Eyes negative.  Respiratory: Respiratory negative.  Cardiovascular: Cardiovascular negative.  GI: Gastrointestinal negative.  Musculoskeletal:       Hand pain with hd Skin: Skin negative.  Neurological: Neurological negative. Hematologic: Hematologic/lymphatic negative.  Psychiatric: Psychiatric negative.        Objective:  Objective   Vitals:   12/25/16 1509  BP: 116/60  Pulse: 62  Resp: 20  Temp: (!) 96.6 F (35.9 C)  SpO2: 99%  Weight: 169 lb 3.2 oz (76.7 kg)  Height: 5\' 10"  (1.778 m)   Body mass index is 24.28 kg/m.  Physical Exam  Constitutional: He is oriented to person, place, and time. He appears well-developed.  HENT:  Head: Normocephalic.  Eyes: Pupils are equal, round, and reactive to light.  Neck: Normal range of motion.  Pulmonary/Chest: Effort normal.  Abdominal: Soft.  Musculoskeletal:  Strong thrill in right radiocephalic avf. Vein tracks onto upper arm  Neurological: He is alert and oriented to person, place, and time.  Skin: Skin is warm and dry.  Psychiatric: He has a normal mood and affect. His behavior is normal. Thought content normal.         Assessment/Plan:     83 year old male is planned dialysis via right side radiocephalic AV fistula with a very strong thrill within it.  He does not have tissue loss or ulceration but has excruciating pain in his hand during dialysis.  He is currently using his catheter because of the pain.  I have offered him to do nothing and continues the catheter versus possible ligation of the fistula versus converting the fistula to an upper arm fistula versus fistulogram.  At this time we are going to perform a fistulogram we will also evaluate the distal arteries at that time.  If anything can be done we will certainly this could make him worse if we improve the flow through the fistula.  He demonstrates good understanding and is willing to  proceed with this.  He will continues with catheter in the interim.  Catheter has been taken out he can use the fistula but it will be painful.     Waynetta Sandy MD [facility]

## 2016-12-26 DIAGNOSIS — Z992 Dependence on renal dialysis: Secondary | ICD-10-CM | POA: Diagnosis not present

## 2016-12-26 DIAGNOSIS — D509 Iron deficiency anemia, unspecified: Secondary | ICD-10-CM | POA: Diagnosis not present

## 2016-12-26 DIAGNOSIS — N2581 Secondary hyperparathyroidism of renal origin: Secondary | ICD-10-CM | POA: Diagnosis not present

## 2016-12-26 DIAGNOSIS — N186 End stage renal disease: Secondary | ICD-10-CM | POA: Diagnosis not present

## 2016-12-26 DIAGNOSIS — D631 Anemia in chronic kidney disease: Secondary | ICD-10-CM | POA: Diagnosis not present

## 2016-12-29 DIAGNOSIS — D631 Anemia in chronic kidney disease: Secondary | ICD-10-CM | POA: Diagnosis not present

## 2016-12-29 DIAGNOSIS — N186 End stage renal disease: Secondary | ICD-10-CM | POA: Diagnosis not present

## 2016-12-29 DIAGNOSIS — D509 Iron deficiency anemia, unspecified: Secondary | ICD-10-CM | POA: Diagnosis not present

## 2016-12-29 DIAGNOSIS — N2581 Secondary hyperparathyroidism of renal origin: Secondary | ICD-10-CM | POA: Diagnosis not present

## 2016-12-29 DIAGNOSIS — Z992 Dependence on renal dialysis: Secondary | ICD-10-CM | POA: Diagnosis not present

## 2016-12-31 ENCOUNTER — Other Ambulatory Visit: Payer: Self-pay | Admitting: *Deleted

## 2016-12-31 DIAGNOSIS — Z992 Dependence on renal dialysis: Secondary | ICD-10-CM | POA: Diagnosis not present

## 2016-12-31 DIAGNOSIS — N186 End stage renal disease: Secondary | ICD-10-CM | POA: Diagnosis not present

## 2016-12-31 DIAGNOSIS — D631 Anemia in chronic kidney disease: Secondary | ICD-10-CM | POA: Diagnosis not present

## 2016-12-31 DIAGNOSIS — N2581 Secondary hyperparathyroidism of renal origin: Secondary | ICD-10-CM | POA: Diagnosis not present

## 2016-12-31 DIAGNOSIS — D509 Iron deficiency anemia, unspecified: Secondary | ICD-10-CM | POA: Diagnosis not present

## 2017-01-02 DIAGNOSIS — D509 Iron deficiency anemia, unspecified: Secondary | ICD-10-CM | POA: Diagnosis not present

## 2017-01-02 DIAGNOSIS — Z992 Dependence on renal dialysis: Secondary | ICD-10-CM | POA: Diagnosis not present

## 2017-01-02 DIAGNOSIS — N2581 Secondary hyperparathyroidism of renal origin: Secondary | ICD-10-CM | POA: Diagnosis not present

## 2017-01-02 DIAGNOSIS — N186 End stage renal disease: Secondary | ICD-10-CM | POA: Diagnosis not present

## 2017-01-02 DIAGNOSIS — D631 Anemia in chronic kidney disease: Secondary | ICD-10-CM | POA: Diagnosis not present

## 2017-01-05 DIAGNOSIS — N186 End stage renal disease: Secondary | ICD-10-CM | POA: Diagnosis not present

## 2017-01-05 DIAGNOSIS — Z992 Dependence on renal dialysis: Secondary | ICD-10-CM | POA: Diagnosis not present

## 2017-01-05 DIAGNOSIS — D509 Iron deficiency anemia, unspecified: Secondary | ICD-10-CM | POA: Diagnosis not present

## 2017-01-05 DIAGNOSIS — D631 Anemia in chronic kidney disease: Secondary | ICD-10-CM | POA: Diagnosis not present

## 2017-01-05 DIAGNOSIS — N2581 Secondary hyperparathyroidism of renal origin: Secondary | ICD-10-CM | POA: Diagnosis not present

## 2017-01-06 ENCOUNTER — Encounter (HOSPITAL_COMMUNITY)
Admission: RE | Disposition: A | Discharge status: Home / Self Care | Payer: Self-pay | Source: Ambulatory Visit | Attending: Vascular Surgery

## 2017-01-06 ENCOUNTER — Ambulatory Visit (HOSPITAL_COMMUNITY)
Admission: RE | Admit: 2017-01-06 | Discharge: 2017-01-06 | Disposition: A | Discharge status: Home / Self Care | Payer: Medicare Other | Source: Ambulatory Visit | Attending: Vascular Surgery | Admitting: Vascular Surgery

## 2017-01-06 DIAGNOSIS — N186 End stage renal disease: Secondary | ICD-10-CM | POA: Insufficient documentation

## 2017-01-06 DIAGNOSIS — T82898A Other specified complication of vascular prosthetic devices, implants and grafts, initial encounter: Secondary | ICD-10-CM | POA: Insufficient documentation

## 2017-01-06 DIAGNOSIS — I770 Arteriovenous fistula, acquired: Secondary | ICD-10-CM | POA: Diagnosis not present

## 2017-01-06 DIAGNOSIS — Z6827 Body mass index (BMI) 27.0-27.9, adult: Secondary | ICD-10-CM | POA: Diagnosis not present

## 2017-01-06 DIAGNOSIS — N185 Chronic kidney disease, stage 5: Secondary | ICD-10-CM | POA: Diagnosis not present

## 2017-01-06 DIAGNOSIS — Z992 Dependence on renal dialysis: Secondary | ICD-10-CM | POA: Insufficient documentation

## 2017-01-06 DIAGNOSIS — M549 Dorsalgia, unspecified: Secondary | ICD-10-CM | POA: Diagnosis not present

## 2017-01-06 DIAGNOSIS — Z299 Encounter for prophylactic measures, unspecified: Secondary | ICD-10-CM | POA: Diagnosis not present

## 2017-01-06 DIAGNOSIS — M79641 Pain in right hand: Secondary | ICD-10-CM | POA: Diagnosis not present

## 2017-01-06 DIAGNOSIS — Y828 Other medical devices associated with adverse incidents: Secondary | ICD-10-CM | POA: Insufficient documentation

## 2017-01-06 DIAGNOSIS — I4891 Unspecified atrial fibrillation: Secondary | ICD-10-CM | POA: Diagnosis not present

## 2017-01-06 HISTORY — PX: A/V FISTULAGRAM: CATH118298

## 2017-01-06 LAB — POCT I-STAT, CHEM 8
BUN: 31 mg/dL — ABNORMAL HIGH (ref 6–20)
CREATININE: 4.3 mg/dL — AB (ref 0.61–1.24)
Calcium, Ion: 0.99 mmol/L — ABNORMAL LOW (ref 1.15–1.40)
Chloride: 94 mmol/L — ABNORMAL LOW (ref 101–111)
GLUCOSE: 95 mg/dL (ref 65–99)
HCT: 29 % — ABNORMAL LOW (ref 39.0–52.0)
HEMOGLOBIN: 9.9 g/dL — AB (ref 13.0–17.0)
Potassium: 4.6 mmol/L (ref 3.5–5.1)
Sodium: 135 mmol/L (ref 135–145)
TCO2: 30 mmol/L (ref 22–32)

## 2017-01-06 SURGERY — A/V FISTULAGRAM
Anesthesia: LOCAL

## 2017-01-06 MED ORDER — LIDOCAINE HCL (PF) 1 % IJ SOLN
INTRAMUSCULAR | Status: DC | PRN
  Administered 2017-01-06: 2 mL

## 2017-01-06 MED ORDER — SODIUM CHLORIDE 0.9% FLUSH: 3.0000 mL | Freq: Two times a day (BID) | INTRAVENOUS | Status: DC

## 2017-01-06 MED ORDER — HEPARIN (PORCINE) IN NACL 2-0.9 UNIT/ML-% IJ SOLN
INTRAMUSCULAR | Status: AC
  Filled 2017-01-06: qty 500

## 2017-01-06 MED ORDER — SODIUM CHLORIDE 0.9% FLUSH: 3.0000 mL | INTRAVENOUS | Status: DC | PRN

## 2017-01-06 MED ORDER — LIDOCAINE HCL (PF) 1 % IJ SOLN
INTRAMUSCULAR | Status: AC
  Filled 2017-01-06: qty 30

## 2017-01-06 MED ORDER — SODIUM CHLORIDE 0.9 % IV SOLN: 250.0000 mL | INTRAVENOUS | Status: DC | PRN

## 2017-01-06 MED ORDER — HEPARIN (PORCINE) IN NACL 2-0.9 UNIT/ML-% IJ SOLN
INTRAMUSCULAR | Status: AC | PRN
  Administered 2017-01-06: 500 mL

## 2017-01-06 MED ORDER — IODIXANOL 320 MG/ML IV SOLN
INTRAVENOUS | Status: DC | PRN
  Administered 2017-01-06: 45 mL via INTRAVENOUS

## 2017-01-06 SURGICAL SUPPLY — 9 items
BAG SNAP BAND KOVER 36X36 (MISCELLANEOUS) ×2 IMPLANT
COVER DOME SNAP 22 D (MISCELLANEOUS) ×2 IMPLANT
COVER PRB 48X5XTLSCP FOLD TPE (BAG) ×1 IMPLANT
COVER PROBE 5X48 (BAG) ×2
KIT MICROINTRODUCER STIFF 5F (SHEATH) ×1 IMPLANT
PROTECTION STATION PRESSURIZED (MISCELLANEOUS) ×2
STATION PROTECTION PRESSURIZED (MISCELLANEOUS) ×1 IMPLANT
STOPCOCK MORSE 400PSI 3WAY (MISCELLANEOUS) ×2 IMPLANT
TRAY PV CATH (CUSTOM PROCEDURE TRAY) ×2 IMPLANT

## 2017-01-06 NOTE — H&P (Signed)
   History and Physical Update  The patient was interviewed and re-examined.  The patient's previous History and Physical has been reviewed and is unchanged from recent office visit. Plan for right arm fistulogram.  Jeffrie Stander C. Donzetta Matters, MD [facility] [place] Office: [phone] Pager: [phone]   01/06/2017, 9:06 AM

## 2017-01-06 NOTE — Discharge Instructions (Signed)

## 2017-01-06 NOTE — Op Note (Signed)
    Patient name: GERSHON SHORTEN MRN: [id number] DOB: 06-14-1925 Sex: male  01/06/2017 Pre-operative Diagnosis: esrd, right hand pain Post-operative diagnosis:  Same Surgeon:  Eda Paschal. Donzetta Matters, MD Procedure Performed: 1.  US guided cannulation of right radiocephalic avf 2.  Right arm fistulogram   Indications: 83 year old male on dialysis currently via left IJ catheter.  He has a right radiocephalic AV fistula but has significant pain on dialysis.  He is now indicated for fistulogram possible intervention  Findings: The fistula is patent giving rise to both the basilic and cephalic vein cephalad.  With compression of the fistula demonstrate flow via the radial artery filling the hand.  Together with physical exam patient's symptoms this is consistent with steal.   I have offered him either ligation of the fistula which would make him catheter dependent or conversion to a brachial based fistula which may not improve his symptoms may actually worsen.  He is hopeful proceed with peritoneal dialysis and so we will follow-up on a as needed basis.   Procedure:  The patient was identified in the holding area and taken to room 8.  The patient was then placed supine on the table and prepped and draped in the usual sterile fashion.  A time out was called.  Ultrasound was used to evaluate the right radiocephalic AV fistula this was cannulated with micropuncture needle and sheath placed.  We performed right upper extremity fistulogram as well as retrograde imaging of the arterial system.  Given the above findings no intervention was undertaken.  Patient tolerated procedure without immediate complication.  Contrast: 45cc  Brandon C. Donzetta Matters, MD [facility] [place] Office: [phone] Pager: [phone]

## 2017-01-07 ENCOUNTER — Encounter (HOSPITAL_COMMUNITY): Payer: Self-pay | Admitting: Vascular Surgery

## 2017-01-07 DIAGNOSIS — Z992 Dependence on renal dialysis: Secondary | ICD-10-CM | POA: Diagnosis not present

## 2017-01-07 DIAGNOSIS — N2581 Secondary hyperparathyroidism of renal origin: Secondary | ICD-10-CM | POA: Diagnosis not present

## 2017-01-07 DIAGNOSIS — D509 Iron deficiency anemia, unspecified: Secondary | ICD-10-CM | POA: Diagnosis not present

## 2017-01-07 DIAGNOSIS — D631 Anemia in chronic kidney disease: Secondary | ICD-10-CM | POA: Diagnosis not present

## 2017-01-07 DIAGNOSIS — N186 End stage renal disease: Secondary | ICD-10-CM | POA: Diagnosis not present

## 2017-01-09 DIAGNOSIS — Z992 Dependence on renal dialysis: Secondary | ICD-10-CM | POA: Diagnosis not present

## 2017-01-09 DIAGNOSIS — D509 Iron deficiency anemia, unspecified: Secondary | ICD-10-CM | POA: Diagnosis not present

## 2017-01-09 DIAGNOSIS — D631 Anemia in chronic kidney disease: Secondary | ICD-10-CM | POA: Diagnosis not present

## 2017-01-09 DIAGNOSIS — N186 End stage renal disease: Secondary | ICD-10-CM | POA: Diagnosis not present

## 2017-01-09 DIAGNOSIS — N2581 Secondary hyperparathyroidism of renal origin: Secondary | ICD-10-CM | POA: Diagnosis not present

## 2017-01-11 DIAGNOSIS — D509 Iron deficiency anemia, unspecified: Secondary | ICD-10-CM | POA: Diagnosis not present

## 2017-01-11 DIAGNOSIS — N186 End stage renal disease: Secondary | ICD-10-CM | POA: Diagnosis not present

## 2017-01-11 DIAGNOSIS — D631 Anemia in chronic kidney disease: Secondary | ICD-10-CM | POA: Diagnosis not present

## 2017-01-11 DIAGNOSIS — N2581 Secondary hyperparathyroidism of renal origin: Secondary | ICD-10-CM | POA: Diagnosis not present

## 2017-01-11 DIAGNOSIS — Z992 Dependence on renal dialysis: Secondary | ICD-10-CM | POA: Diagnosis not present

## 2017-01-14 DIAGNOSIS — N186 End stage renal disease: Secondary | ICD-10-CM | POA: Diagnosis not present

## 2017-01-14 DIAGNOSIS — Z992 Dependence on renal dialysis: Secondary | ICD-10-CM | POA: Diagnosis not present

## 2017-01-14 DIAGNOSIS — D631 Anemia in chronic kidney disease: Secondary | ICD-10-CM | POA: Diagnosis not present

## 2017-01-14 DIAGNOSIS — N2581 Secondary hyperparathyroidism of renal origin: Secondary | ICD-10-CM | POA: Diagnosis not present

## 2017-01-14 DIAGNOSIS — D509 Iron deficiency anemia, unspecified: Secondary | ICD-10-CM | POA: Diagnosis not present

## 2017-01-16 DIAGNOSIS — Z992 Dependence on renal dialysis: Secondary | ICD-10-CM | POA: Diagnosis not present

## 2017-01-16 DIAGNOSIS — N2581 Secondary hyperparathyroidism of renal origin: Secondary | ICD-10-CM | POA: Diagnosis not present

## 2017-01-16 DIAGNOSIS — N186 End stage renal disease: Secondary | ICD-10-CM | POA: Diagnosis not present

## 2017-01-16 DIAGNOSIS — D509 Iron deficiency anemia, unspecified: Secondary | ICD-10-CM | POA: Diagnosis not present

## 2017-01-16 DIAGNOSIS — D631 Anemia in chronic kidney disease: Secondary | ICD-10-CM | POA: Diagnosis not present

## 2017-01-18 DIAGNOSIS — N2581 Secondary hyperparathyroidism of renal origin: Secondary | ICD-10-CM | POA: Diagnosis not present

## 2017-01-18 DIAGNOSIS — N186 End stage renal disease: Secondary | ICD-10-CM | POA: Diagnosis not present

## 2017-01-18 DIAGNOSIS — D509 Iron deficiency anemia, unspecified: Secondary | ICD-10-CM | POA: Diagnosis not present

## 2017-01-18 DIAGNOSIS — D631 Anemia in chronic kidney disease: Secondary | ICD-10-CM | POA: Diagnosis not present

## 2017-01-18 DIAGNOSIS — Z992 Dependence on renal dialysis: Secondary | ICD-10-CM | POA: Diagnosis not present

## 2017-01-21 DIAGNOSIS — N186 End stage renal disease: Secondary | ICD-10-CM | POA: Diagnosis not present

## 2017-01-21 DIAGNOSIS — Z992 Dependence on renal dialysis: Secondary | ICD-10-CM | POA: Diagnosis not present

## 2017-01-21 DIAGNOSIS — D509 Iron deficiency anemia, unspecified: Secondary | ICD-10-CM | POA: Diagnosis not present

## 2017-01-21 DIAGNOSIS — D631 Anemia in chronic kidney disease: Secondary | ICD-10-CM | POA: Diagnosis not present

## 2017-01-21 DIAGNOSIS — N2581 Secondary hyperparathyroidism of renal origin: Secondary | ICD-10-CM | POA: Diagnosis not present

## 2017-01-23 DIAGNOSIS — N2581 Secondary hyperparathyroidism of renal origin: Secondary | ICD-10-CM | POA: Diagnosis not present

## 2017-01-23 DIAGNOSIS — D631 Anemia in chronic kidney disease: Secondary | ICD-10-CM | POA: Diagnosis not present

## 2017-01-23 DIAGNOSIS — N186 End stage renal disease: Secondary | ICD-10-CM | POA: Diagnosis not present

## 2017-01-23 DIAGNOSIS — Z992 Dependence on renal dialysis: Secondary | ICD-10-CM | POA: Diagnosis not present

## 2017-01-23 DIAGNOSIS — D509 Iron deficiency anemia, unspecified: Secondary | ICD-10-CM | POA: Diagnosis not present

## 2017-01-26 DIAGNOSIS — N186 End stage renal disease: Secondary | ICD-10-CM | POA: Diagnosis not present

## 2017-01-26 DIAGNOSIS — Z992 Dependence on renal dialysis: Secondary | ICD-10-CM | POA: Diagnosis not present

## 2017-01-26 DIAGNOSIS — N2581 Secondary hyperparathyroidism of renal origin: Secondary | ICD-10-CM | POA: Diagnosis not present

## 2017-01-26 DIAGNOSIS — D509 Iron deficiency anemia, unspecified: Secondary | ICD-10-CM | POA: Diagnosis not present

## 2017-01-26 DIAGNOSIS — D631 Anemia in chronic kidney disease: Secondary | ICD-10-CM | POA: Diagnosis not present

## 2017-01-28 DIAGNOSIS — N186 End stage renal disease: Secondary | ICD-10-CM | POA: Diagnosis not present

## 2017-01-28 DIAGNOSIS — N2581 Secondary hyperparathyroidism of renal origin: Secondary | ICD-10-CM | POA: Diagnosis not present

## 2017-01-28 DIAGNOSIS — Z992 Dependence on renal dialysis: Secondary | ICD-10-CM | POA: Diagnosis not present

## 2017-01-28 DIAGNOSIS — D509 Iron deficiency anemia, unspecified: Secondary | ICD-10-CM | POA: Diagnosis not present

## 2017-01-28 DIAGNOSIS — D631 Anemia in chronic kidney disease: Secondary | ICD-10-CM | POA: Diagnosis not present

## 2017-01-30 DIAGNOSIS — D631 Anemia in chronic kidney disease: Secondary | ICD-10-CM | POA: Diagnosis not present

## 2017-01-30 DIAGNOSIS — Z992 Dependence on renal dialysis: Secondary | ICD-10-CM | POA: Diagnosis not present

## 2017-01-30 DIAGNOSIS — N186 End stage renal disease: Secondary | ICD-10-CM | POA: Diagnosis not present

## 2017-01-30 DIAGNOSIS — D509 Iron deficiency anemia, unspecified: Secondary | ICD-10-CM | POA: Diagnosis not present

## 2017-01-30 DIAGNOSIS — N2581 Secondary hyperparathyroidism of renal origin: Secondary | ICD-10-CM | POA: Diagnosis not present

## 2017-02-02 DIAGNOSIS — D631 Anemia in chronic kidney disease: Secondary | ICD-10-CM | POA: Diagnosis not present

## 2017-02-02 DIAGNOSIS — Z992 Dependence on renal dialysis: Secondary | ICD-10-CM | POA: Diagnosis not present

## 2017-02-02 DIAGNOSIS — N186 End stage renal disease: Secondary | ICD-10-CM | POA: Diagnosis not present

## 2017-02-02 DIAGNOSIS — N2581 Secondary hyperparathyroidism of renal origin: Secondary | ICD-10-CM | POA: Diagnosis not present

## 2017-02-02 DIAGNOSIS — D509 Iron deficiency anemia, unspecified: Secondary | ICD-10-CM | POA: Diagnosis not present

## 2017-02-04 DIAGNOSIS — N186 End stage renal disease: Secondary | ICD-10-CM | POA: Diagnosis not present

## 2017-02-04 DIAGNOSIS — D631 Anemia in chronic kidney disease: Secondary | ICD-10-CM | POA: Diagnosis not present

## 2017-02-04 DIAGNOSIS — Z992 Dependence on renal dialysis: Secondary | ICD-10-CM | POA: Diagnosis not present

## 2017-02-04 DIAGNOSIS — N2581 Secondary hyperparathyroidism of renal origin: Secondary | ICD-10-CM | POA: Diagnosis not present

## 2017-02-04 DIAGNOSIS — D509 Iron deficiency anemia, unspecified: Secondary | ICD-10-CM | POA: Diagnosis not present

## 2017-02-05 DIAGNOSIS — Z789 Other specified health status: Secondary | ICD-10-CM | POA: Diagnosis not present

## 2017-02-05 DIAGNOSIS — I4891 Unspecified atrial fibrillation: Secondary | ICD-10-CM | POA: Diagnosis not present

## 2017-02-05 DIAGNOSIS — Z6827 Body mass index (BMI) 27.0-27.9, adult: Secondary | ICD-10-CM | POA: Diagnosis not present

## 2017-02-05 DIAGNOSIS — I10 Essential (primary) hypertension: Secondary | ICD-10-CM | POA: Diagnosis not present

## 2017-02-05 DIAGNOSIS — M549 Dorsalgia, unspecified: Secondary | ICD-10-CM | POA: Diagnosis not present

## 2017-02-05 DIAGNOSIS — N185 Chronic kidney disease, stage 5: Secondary | ICD-10-CM | POA: Diagnosis not present

## 2017-02-05 DIAGNOSIS — Z299 Encounter for prophylactic measures, unspecified: Secondary | ICD-10-CM | POA: Diagnosis not present

## 2017-02-06 DIAGNOSIS — D509 Iron deficiency anemia, unspecified: Secondary | ICD-10-CM | POA: Diagnosis not present

## 2017-02-06 DIAGNOSIS — Z992 Dependence on renal dialysis: Secondary | ICD-10-CM | POA: Diagnosis not present

## 2017-02-06 DIAGNOSIS — D631 Anemia in chronic kidney disease: Secondary | ICD-10-CM | POA: Diagnosis not present

## 2017-02-06 DIAGNOSIS — N186 End stage renal disease: Secondary | ICD-10-CM | POA: Diagnosis not present

## 2017-02-06 DIAGNOSIS — N2581 Secondary hyperparathyroidism of renal origin: Secondary | ICD-10-CM | POA: Diagnosis not present

## 2017-02-09 DIAGNOSIS — D509 Iron deficiency anemia, unspecified: Secondary | ICD-10-CM | POA: Diagnosis not present

## 2017-02-09 DIAGNOSIS — N186 End stage renal disease: Secondary | ICD-10-CM | POA: Diagnosis not present

## 2017-02-09 DIAGNOSIS — Z992 Dependence on renal dialysis: Secondary | ICD-10-CM | POA: Diagnosis not present

## 2017-02-09 DIAGNOSIS — D631 Anemia in chronic kidney disease: Secondary | ICD-10-CM | POA: Diagnosis not present

## 2017-02-09 DIAGNOSIS — N2581 Secondary hyperparathyroidism of renal origin: Secondary | ICD-10-CM | POA: Diagnosis not present

## 2017-02-11 DIAGNOSIS — E875 Hyperkalemia: Secondary | ICD-10-CM | POA: Diagnosis not present

## 2017-02-11 DIAGNOSIS — D631 Anemia in chronic kidney disease: Secondary | ICD-10-CM | POA: Diagnosis not present

## 2017-02-11 DIAGNOSIS — D509 Iron deficiency anemia, unspecified: Secondary | ICD-10-CM | POA: Diagnosis not present

## 2017-02-11 DIAGNOSIS — Z992 Dependence on renal dialysis: Secondary | ICD-10-CM | POA: Diagnosis not present

## 2017-02-11 DIAGNOSIS — N2581 Secondary hyperparathyroidism of renal origin: Secondary | ICD-10-CM | POA: Diagnosis not present

## 2017-02-11 DIAGNOSIS — N186 End stage renal disease: Secondary | ICD-10-CM | POA: Diagnosis not present

## 2017-02-13 DIAGNOSIS — N186 End stage renal disease: Secondary | ICD-10-CM | POA: Diagnosis not present

## 2017-02-13 DIAGNOSIS — N2581 Secondary hyperparathyroidism of renal origin: Secondary | ICD-10-CM | POA: Diagnosis not present

## 2017-02-13 DIAGNOSIS — D631 Anemia in chronic kidney disease: Secondary | ICD-10-CM | POA: Diagnosis not present

## 2017-02-13 DIAGNOSIS — Z992 Dependence on renal dialysis: Secondary | ICD-10-CM | POA: Diagnosis not present

## 2017-02-13 DIAGNOSIS — D509 Iron deficiency anemia, unspecified: Secondary | ICD-10-CM | POA: Diagnosis not present

## 2017-02-16 DIAGNOSIS — N2581 Secondary hyperparathyroidism of renal origin: Secondary | ICD-10-CM | POA: Diagnosis not present

## 2017-02-16 DIAGNOSIS — N186 End stage renal disease: Secondary | ICD-10-CM | POA: Diagnosis not present

## 2017-02-16 DIAGNOSIS — Z992 Dependence on renal dialysis: Secondary | ICD-10-CM | POA: Diagnosis not present

## 2017-02-16 DIAGNOSIS — D631 Anemia in chronic kidney disease: Secondary | ICD-10-CM | POA: Diagnosis not present

## 2017-02-16 DIAGNOSIS — D509 Iron deficiency anemia, unspecified: Secondary | ICD-10-CM | POA: Diagnosis not present

## 2017-02-18 DIAGNOSIS — N186 End stage renal disease: Secondary | ICD-10-CM | POA: Diagnosis not present

## 2017-02-18 DIAGNOSIS — D509 Iron deficiency anemia, unspecified: Secondary | ICD-10-CM | POA: Diagnosis not present

## 2017-02-18 DIAGNOSIS — N2581 Secondary hyperparathyroidism of renal origin: Secondary | ICD-10-CM | POA: Diagnosis not present

## 2017-02-18 DIAGNOSIS — Z992 Dependence on renal dialysis: Secondary | ICD-10-CM | POA: Diagnosis not present

## 2017-02-18 DIAGNOSIS — D631 Anemia in chronic kidney disease: Secondary | ICD-10-CM | POA: Diagnosis not present

## 2017-02-19 DIAGNOSIS — R3912 Poor urinary stream: Secondary | ICD-10-CM | POA: Diagnosis not present

## 2017-02-20 DIAGNOSIS — D631 Anemia in chronic kidney disease: Secondary | ICD-10-CM | POA: Diagnosis not present

## 2017-02-20 DIAGNOSIS — N186 End stage renal disease: Secondary | ICD-10-CM | POA: Diagnosis not present

## 2017-02-20 DIAGNOSIS — Z992 Dependence on renal dialysis: Secondary | ICD-10-CM | POA: Diagnosis not present

## 2017-02-20 DIAGNOSIS — N2581 Secondary hyperparathyroidism of renal origin: Secondary | ICD-10-CM | POA: Diagnosis not present

## 2017-02-20 DIAGNOSIS — D509 Iron deficiency anemia, unspecified: Secondary | ICD-10-CM | POA: Diagnosis not present

## 2017-02-23 DIAGNOSIS — Z992 Dependence on renal dialysis: Secondary | ICD-10-CM | POA: Diagnosis not present

## 2017-02-23 DIAGNOSIS — N2581 Secondary hyperparathyroidism of renal origin: Secondary | ICD-10-CM | POA: Diagnosis not present

## 2017-02-23 DIAGNOSIS — D509 Iron deficiency anemia, unspecified: Secondary | ICD-10-CM | POA: Diagnosis not present

## 2017-02-23 DIAGNOSIS — N186 End stage renal disease: Secondary | ICD-10-CM | POA: Diagnosis not present

## 2017-02-23 DIAGNOSIS — D631 Anemia in chronic kidney disease: Secondary | ICD-10-CM | POA: Diagnosis not present

## 2017-02-25 DIAGNOSIS — Z992 Dependence on renal dialysis: Secondary | ICD-10-CM | POA: Diagnosis not present

## 2017-02-25 DIAGNOSIS — N2581 Secondary hyperparathyroidism of renal origin: Secondary | ICD-10-CM | POA: Diagnosis not present

## 2017-02-25 DIAGNOSIS — D509 Iron deficiency anemia, unspecified: Secondary | ICD-10-CM | POA: Diagnosis not present

## 2017-02-25 DIAGNOSIS — D631 Anemia in chronic kidney disease: Secondary | ICD-10-CM | POA: Diagnosis not present

## 2017-02-25 DIAGNOSIS — N186 End stage renal disease: Secondary | ICD-10-CM | POA: Diagnosis not present

## 2017-02-27 DIAGNOSIS — N2581 Secondary hyperparathyroidism of renal origin: Secondary | ICD-10-CM | POA: Diagnosis not present

## 2017-02-27 DIAGNOSIS — N186 End stage renal disease: Secondary | ICD-10-CM | POA: Diagnosis not present

## 2017-02-27 DIAGNOSIS — Z992 Dependence on renal dialysis: Secondary | ICD-10-CM | POA: Diagnosis not present

## 2017-02-27 DIAGNOSIS — D509 Iron deficiency anemia, unspecified: Secondary | ICD-10-CM | POA: Diagnosis not present

## 2017-02-27 DIAGNOSIS — D631 Anemia in chronic kidney disease: Secondary | ICD-10-CM | POA: Diagnosis not present

## 2017-03-02 DIAGNOSIS — N2581 Secondary hyperparathyroidism of renal origin: Secondary | ICD-10-CM | POA: Diagnosis not present

## 2017-03-02 DIAGNOSIS — D509 Iron deficiency anemia, unspecified: Secondary | ICD-10-CM | POA: Diagnosis not present

## 2017-03-02 DIAGNOSIS — D631 Anemia in chronic kidney disease: Secondary | ICD-10-CM | POA: Diagnosis not present

## 2017-03-02 DIAGNOSIS — Z992 Dependence on renal dialysis: Secondary | ICD-10-CM | POA: Diagnosis not present

## 2017-03-02 DIAGNOSIS — N186 End stage renal disease: Secondary | ICD-10-CM | POA: Diagnosis not present

## 2017-03-03 DIAGNOSIS — I770 Arteriovenous fistula, acquired: Secondary | ICD-10-CM | POA: Diagnosis not present

## 2017-03-03 DIAGNOSIS — I4891 Unspecified atrial fibrillation: Secondary | ICD-10-CM | POA: Diagnosis not present

## 2017-03-03 DIAGNOSIS — Z6827 Body mass index (BMI) 27.0-27.9, adult: Secondary | ICD-10-CM | POA: Diagnosis not present

## 2017-03-03 DIAGNOSIS — Z79899 Other long term (current) drug therapy: Secondary | ICD-10-CM | POA: Diagnosis not present

## 2017-03-03 DIAGNOSIS — M549 Dorsalgia, unspecified: Secondary | ICD-10-CM | POA: Diagnosis not present

## 2017-03-03 DIAGNOSIS — Z992 Dependence on renal dialysis: Secondary | ICD-10-CM | POA: Diagnosis not present

## 2017-03-03 DIAGNOSIS — I10 Essential (primary) hypertension: Secondary | ICD-10-CM | POA: Diagnosis not present

## 2017-03-03 DIAGNOSIS — N185 Chronic kidney disease, stage 5: Secondary | ICD-10-CM | POA: Diagnosis not present

## 2017-03-03 DIAGNOSIS — Z299 Encounter for prophylactic measures, unspecified: Secondary | ICD-10-CM | POA: Diagnosis not present

## 2017-03-03 DIAGNOSIS — E039 Hypothyroidism, unspecified: Secondary | ICD-10-CM | POA: Diagnosis not present

## 2017-03-04 DIAGNOSIS — N186 End stage renal disease: Secondary | ICD-10-CM | POA: Diagnosis not present

## 2017-03-04 DIAGNOSIS — D631 Anemia in chronic kidney disease: Secondary | ICD-10-CM | POA: Diagnosis not present

## 2017-03-04 DIAGNOSIS — D509 Iron deficiency anemia, unspecified: Secondary | ICD-10-CM | POA: Diagnosis not present

## 2017-03-04 DIAGNOSIS — N2581 Secondary hyperparathyroidism of renal origin: Secondary | ICD-10-CM | POA: Diagnosis not present

## 2017-03-04 DIAGNOSIS — Z992 Dependence on renal dialysis: Secondary | ICD-10-CM | POA: Diagnosis not present

## 2017-03-06 DIAGNOSIS — N2581 Secondary hyperparathyroidism of renal origin: Secondary | ICD-10-CM | POA: Diagnosis not present

## 2017-03-06 DIAGNOSIS — D509 Iron deficiency anemia, unspecified: Secondary | ICD-10-CM | POA: Diagnosis not present

## 2017-03-06 DIAGNOSIS — N186 End stage renal disease: Secondary | ICD-10-CM | POA: Diagnosis not present

## 2017-03-06 DIAGNOSIS — D631 Anemia in chronic kidney disease: Secondary | ICD-10-CM | POA: Diagnosis not present

## 2017-03-06 DIAGNOSIS — Z992 Dependence on renal dialysis: Secondary | ICD-10-CM | POA: Diagnosis not present

## 2017-03-08 DIAGNOSIS — Z950 Presence of cardiac pacemaker: Secondary | ICD-10-CM | POA: Diagnosis not present

## 2017-03-08 DIAGNOSIS — I10 Essential (primary) hypertension: Secondary | ICD-10-CM | POA: Diagnosis not present

## 2017-03-08 DIAGNOSIS — K5900 Constipation, unspecified: Secondary | ICD-10-CM | POA: Diagnosis not present

## 2017-03-08 DIAGNOSIS — Z992 Dependence on renal dialysis: Secondary | ICD-10-CM | POA: Diagnosis not present

## 2017-03-08 DIAGNOSIS — E039 Hypothyroidism, unspecified: Secondary | ICD-10-CM | POA: Diagnosis not present

## 2017-03-08 DIAGNOSIS — Z79899 Other long term (current) drug therapy: Secondary | ICD-10-CM | POA: Diagnosis not present

## 2017-03-08 DIAGNOSIS — Z7982 Long term (current) use of aspirin: Secondary | ICD-10-CM | POA: Diagnosis not present

## 2017-03-08 DIAGNOSIS — R109 Unspecified abdominal pain: Secondary | ICD-10-CM | POA: Diagnosis not present

## 2017-03-09 DIAGNOSIS — D631 Anemia in chronic kidney disease: Secondary | ICD-10-CM | POA: Diagnosis not present

## 2017-03-09 DIAGNOSIS — D509 Iron deficiency anemia, unspecified: Secondary | ICD-10-CM | POA: Diagnosis not present

## 2017-03-09 DIAGNOSIS — N2581 Secondary hyperparathyroidism of renal origin: Secondary | ICD-10-CM | POA: Diagnosis not present

## 2017-03-09 DIAGNOSIS — N186 End stage renal disease: Secondary | ICD-10-CM | POA: Diagnosis not present

## 2017-03-09 DIAGNOSIS — Z992 Dependence on renal dialysis: Secondary | ICD-10-CM | POA: Diagnosis not present

## 2017-03-11 DIAGNOSIS — Z992 Dependence on renal dialysis: Secondary | ICD-10-CM | POA: Diagnosis not present

## 2017-03-11 DIAGNOSIS — N2581 Secondary hyperparathyroidism of renal origin: Secondary | ICD-10-CM | POA: Diagnosis not present

## 2017-03-11 DIAGNOSIS — D509 Iron deficiency anemia, unspecified: Secondary | ICD-10-CM | POA: Diagnosis not present

## 2017-03-11 DIAGNOSIS — D631 Anemia in chronic kidney disease: Secondary | ICD-10-CM | POA: Diagnosis not present

## 2017-03-11 DIAGNOSIS — N186 End stage renal disease: Secondary | ICD-10-CM | POA: Diagnosis not present

## 2017-03-13 DIAGNOSIS — N186 End stage renal disease: Secondary | ICD-10-CM | POA: Diagnosis not present

## 2017-03-13 DIAGNOSIS — D631 Anemia in chronic kidney disease: Secondary | ICD-10-CM | POA: Diagnosis not present

## 2017-03-13 DIAGNOSIS — D509 Iron deficiency anemia, unspecified: Secondary | ICD-10-CM | POA: Diagnosis not present

## 2017-03-13 DIAGNOSIS — Z992 Dependence on renal dialysis: Secondary | ICD-10-CM | POA: Diagnosis not present

## 2017-03-13 DIAGNOSIS — N2581 Secondary hyperparathyroidism of renal origin: Secondary | ICD-10-CM | POA: Diagnosis not present

## 2017-03-16 DIAGNOSIS — N186 End stage renal disease: Secondary | ICD-10-CM | POA: Diagnosis not present

## 2017-03-16 DIAGNOSIS — Z992 Dependence on renal dialysis: Secondary | ICD-10-CM | POA: Diagnosis not present

## 2017-03-16 DIAGNOSIS — D631 Anemia in chronic kidney disease: Secondary | ICD-10-CM | POA: Diagnosis not present

## 2017-03-16 DIAGNOSIS — N2581 Secondary hyperparathyroidism of renal origin: Secondary | ICD-10-CM | POA: Diagnosis not present

## 2017-03-16 DIAGNOSIS — D509 Iron deficiency anemia, unspecified: Secondary | ICD-10-CM | POA: Diagnosis not present

## 2017-03-18 DIAGNOSIS — N2581 Secondary hyperparathyroidism of renal origin: Secondary | ICD-10-CM | POA: Diagnosis not present

## 2017-03-18 DIAGNOSIS — N186 End stage renal disease: Secondary | ICD-10-CM | POA: Diagnosis not present

## 2017-03-18 DIAGNOSIS — D509 Iron deficiency anemia, unspecified: Secondary | ICD-10-CM | POA: Diagnosis not present

## 2017-03-18 DIAGNOSIS — D631 Anemia in chronic kidney disease: Secondary | ICD-10-CM | POA: Diagnosis not present

## 2017-03-18 DIAGNOSIS — Z992 Dependence on renal dialysis: Secondary | ICD-10-CM | POA: Diagnosis not present

## 2017-03-19 NOTE — Progress Notes (Signed)
Cardiology Office Note  Date: 03/22/2017   ID: Dale Torres 23-Oct-1925, MRN [id number]  PCP: Monico Blitz, MD  Primary Cardiologist: Rozann Lesches, MD   Chief Complaint  Patient presents with  . Atrial Fibrillation    History of Present Illness: Dale Torres is an 84 y.o. male last seen in February 2018.  He is here today with his son for a follow-up visit.  He does not report any chest pain or palpitations, continues to tolerate hemodialysis.  No fevers or chills.  He continues to follow with Dr. Rayann Heman in the device clinic, Medtronic pacemaker in place.  I personally reviewed his ECG today which shows an atrial paced rhythm with left bundle branch block.  I reviewed his cardiac medications.  He continues on aspirin, amiodarone, and atenolol.  Also Proamatine for stabilization of blood pressure.  Lab work is followed regularly by Dr. Manuella Ghazi.  Past Medical History:  Diagnosis Date  . Anemia    Dr. Sonny Dandy  . Arthritis   . Benign prostatic hypertrophy   . Carpal tunnel syndrome of left wrist   . Coronary atherosclerosis of native coronary artery    Nonobstructive 2009  . ESRD on hemodialysis ([place])   . Essential hypertension, benign   . GERD (gastroesophageal reflux disease)   . Headache   . History of pneumonia   . HOH (hard of hearing)    Bilateral  hearing aids  . Hypothyroidism   . Persistent atrial fibrillation (HCC)    Declines coumadin  . Presence of permanent cardiac pacemaker   . Sick sinus syndrome [facility])    Status post pacemaker placement 2008  . Skin cancer, basal cell     Past Surgical History:  Procedure Laterality Date  . A/V FISTULAGRAM N/A 01/06/2017   Procedure: A/V FISTULAGRAM;  Surgeon: Waynetta Sandy, MD;  Location: [place] [facility];  Service: Cardiovascular;  Laterality: N/A;  . APPENDECTOMY    . AV FISTULA PLACEMENT Left 08/16/2014   Procedure: Left Arm creation of Brachiocephalic ARTERIOVENOUS (AV) FISTULA ;  Surgeon:  Angelia Mould, MD;  Location: [place];  Service: Vascular;  Laterality: Left;  . AV FISTULA PLACEMENT Right 06/24/2016   Procedure: CREATION OF RIGHT RADIOCEPHALIC ARTERIOVENOUS (AV);  Surgeon: Rosetta Posner, MD;  Location: [place];  Service: Vascular;  Laterality: Right;  . CHOLECYSTECTOMY    . COLONOSCOPY W/ BIOPSIES AND POLYPECTOMY    . EYE SURGERY    . INSERTION OF DIALYSIS CATHETER Left 06/24/2016   Procedure: INSERTION OF DIALYSIS CATHETER - left Internal Jugular placement;  Surgeon: Rosetta Posner, MD;  Location: [place];  Service: Vascular;  Laterality: Left;  . IR FLUORO GUIDE CV LINE RIGHT  08/06/2016  . LAMINECTOMY    . LIGATION OF ARTERIOVENOUS  FISTULA Left 06/24/2016   Procedure: LIGATION OF left arm  ARTERIOVENOUS  FISTULA;  Surgeon: Rosetta Posner, MD;  Location: [place];  Service: Vascular;  Laterality: Left;  . PACEMAKER GENERATOR CHANGE  03/03/11   MDT Adaptal L implanted by Dr Rayann Heman  . PACEMAKER PLACEMENT  2008   Medtronic - Dr. Doreatha [place]  . PERIPHERAL VASCULAR CATHETERIZATION Left 03/06/2015   Procedure: Fistulagram;  Surgeon: Serafina Mitchell, MD;  Location: [place] [facility];  Service: Cardiovascular;  Laterality: Left;  . PERMANENT PACEMAKER GENERATOR CHANGE N/A 03/03/2011   Procedure: PERMANENT PACEMAKER GENERATOR CHANGE;  Surgeon: Thompson Grayer, MD;  Location: [facility] CATH LAB;  Service: Cardiovascular;  Laterality: N/A;  .  Right rotator cuff repair    . THROMBECTOMY AND REVISION OF ARTERIOVENTOUS (AV) GORETEX  GRAFT Left 03/11/2015   Procedure: THROMBECTOMY AND REVISION OF ARTERIOVENTOUS (AV) GORETEX  GRAFT LEFT ARM;  Surgeon: Rosetta Posner, MD;  Location: [place];  Service: Vascular;  Laterality: Left;  . TONSILLECTOMY      Current Outpatient Medications  Medication Sig Dispense Refill  . amiodarone (PACERONE) 200 MG tablet Take 200 mg by mouth daily.    Marland Kitchen aspirin EC 81 MG tablet Take 81 mg by mouth at bedtime.    Marland Kitchen atenolol (TENORMIN) 25 MG tablet Take 12.5 mg by mouth daily.      Marland Kitchen docusate sodium (COLACE) 100 MG capsule Take 100 mg by mouth daily.     . folic acid (FOLVITE) 1 MG tablet Take 1 mg by mouth daily.      Marland Kitchen gabapentin (NEURONTIN) 400 MG capsule Take 400 mg by mouth at bedtime.     Marland Kitchen HYDROcodone-acetaminophen (NORCO) 10-325 MG tablet Take 1 tablet by mouth every 6 (six) hours as needed for severe pain. (Patient taking differently: Take 1 tablet by mouth 3 (three) times daily. ) 15 tablet 0  . levothyroxine (SYNTHROID, LEVOTHROID) 125 MCG tablet Take 125 mcg by mouth daily at 2 PM. In the afternoon.    . Melatonin 5 MG TABS Take 5 mg by mouth at bedtime.    . midodrine (PROAMATINE) 10 MG tablet Take 10 mg by mouth 2 (two) times daily.     . naproxen sodium (ALEVE) 220 MG tablet Take 440 mg by mouth daily as needed (for pain.).    Marland Kitchen nitroGLYCERIN (NITROSTAT) 0.4 MG SL tablet Place 1 tablet (0.4 mg total) under the tongue every 5 (five) minutes as needed. (Patient taking differently: Place 0.4 mg under the tongue every 5 (five) minutes as needed for chest pain. ) 25 tablet 2  . omeprazole (PRILOSEC) 20 MG capsule Take 20 mg by mouth at bedtime.     . sevelamer carbonate (RENVELA) 800 MG tablet Take 800-1,600 mg by mouth. Take 800 mg-1600 mg with each meal and 800mg  with each snack    . tamsulosin (FLOMAX) 0.4 MG CAPS capsule Take 0.4 mg by mouth at bedtime.     No current facility-administered medications for this visit.    Allergies:  Oxycodone hcl   Social History: The patient  reports that he quit smoking about 57 years ago. His smoking use included cigarettes. He started smoking about 69 years ago. He has a 9.60 pack-year smoking history. He quit smokeless tobacco use about 37 years ago. His smokeless tobacco use included chew. He reports that he drinks alcohol. He reports that he does not use drugs.   ROS:  Please see the history of present illness. Otherwise, complete review of systems is positive for hearing loss.  All other systems are reviewed and  negative.   Physical Exam: VS:  BP 134/70   Pulse 62   Ht 5\' 10"  (1.778 m)   Wt 174 lb 9.6 oz (79.2 kg)   SpO2 100%   BMI 25.05 kg/m , BMI Body mass index is 25.05 kg/m.  Wt Readings from Last 3 Encounters:  03/22/17 174 lb 9.6 oz (79.2 kg)  01/06/17 170 lb (77.1 kg)  12/25/16 169 lb 3.2 oz (76.7 kg)    General: Elderly male, appears comfortable at rest.  Uses rolling walker. HEENT: Conjunctiva and lids normal, oropharynx clear. Neck: Supple, no elevated JVP or carotid bruits, no thyromegaly. Lungs:  Clear to auscultation, nonlabored breathing at rest. Cardiac: Regular rate and rhythm, no S3, soft systolic murmur. Abdomen: Soft, nontender, bowel sounds present. Extremities: Mild ankle edema, distal pulses 1+.  Left arm AV fistula. Skin: Warm and dry. Musculoskeletal:  Kyphosis noted. Neuropsychiatric: Alert and oriented x3, affect grossly appropriate.  ECG: I personally reviewed the tracing from 05/01/2016 which showed an atrial paced rhythm with left bundle branch block.  Recent Labwork: 01/06/2017: BUN 31; Creatinine, Ser 4.30; Hemoglobin 9.9; Potassium 4.6; Sodium 135   Other Studies Reviewed Today:  Lexiscan Cardiolite June 2014: Low risk with possible inferior scar and minor peri-infarct ischemia, LVEF 43%.   Echocardiogram 06/29/2012: Study Conclusions  - Left ventricle: The cavity size was normal. There was mild concentric hypertrophy. Systolic function was mildly to moderately reduced. The estimated ejection fraction was in the range of 40% to 45%. There is hypokinesis of the basal-midinferior myocardium. There is hypokinesis of the mid-distalanteroseptal myocardium. Doppler parameters are consistent with abnormal left ventricular relaxation (grade 1 diastolic dysfunction). - Aortic valve: Trileaflet; mildly thickened leaflets. Trivial regurgitation. Mean gradient: 86mm Hg (S). Valve area: 2.67cm^2(VTI). - Mitral valve: Mild regurgitation. -  Left atrium: The atrium was moderately dilated. - Right ventricle: Pacer wire or catheter noted in right ventricle. - Right atrium: Central venous pressure: 29mm Hg (est). - Tricuspid valve: Mild regurgitation. - Pulmonic valve: Mild regurgitation. - Pulmonary arteries: PA peak pressure: 36mm Hg (S). - Pericardium, extracardiac: There was no pericardial effusion.  Assessment and Plan:  1.  Paroxysmal atrial fibrillation.  Continue with current regimen including amiodarone and atenolol.  He remains on aspirin having declined anticoagulation.  Follow-up lab work with Dr. Manuella Ghazi.  2.  Sick sinus syndrome with Medtronic pacemaker in place.  Keep follow-up with Dr. Rayann Heman.  3.  Non-obstructive CAD, no active angina symptoms.  4.  ESRD on hemodialysis, follows with Dr. Lowanda Foster.  Current medicines were reviewed with the patient today.   Orders Placed This Encounter  Procedures  . EKG 12-Lead    Disposition: Follow-up in 6 months.  Signed, Satira Sark, MD, [facility] 03/22/2017 4:09 PM    [facility] at [place], [place], [place] [address] Phone: [phone]; Fax: [phone]

## 2017-03-20 DIAGNOSIS — N2581 Secondary hyperparathyroidism of renal origin: Secondary | ICD-10-CM | POA: Diagnosis not present

## 2017-03-20 DIAGNOSIS — D631 Anemia in chronic kidney disease: Secondary | ICD-10-CM | POA: Diagnosis not present

## 2017-03-20 DIAGNOSIS — Z992 Dependence on renal dialysis: Secondary | ICD-10-CM | POA: Diagnosis not present

## 2017-03-20 DIAGNOSIS — N186 End stage renal disease: Secondary | ICD-10-CM | POA: Diagnosis not present

## 2017-03-20 DIAGNOSIS — D509 Iron deficiency anemia, unspecified: Secondary | ICD-10-CM | POA: Diagnosis not present

## 2017-03-22 ENCOUNTER — Ambulatory Visit (INDEPENDENT_AMBULATORY_CARE_PROVIDER_SITE_OTHER): Payer: Medicare Other | Admitting: Cardiology

## 2017-03-22 ENCOUNTER — Encounter: Payer: Self-pay | Admitting: Cardiology

## 2017-03-22 VITALS — BP 134/70 | HR 62 | Ht 70.0 in | Wt 174.6 lb

## 2017-03-22 DIAGNOSIS — N186 End stage renal disease: Secondary | ICD-10-CM | POA: Diagnosis not present

## 2017-03-22 DIAGNOSIS — I495 Sick sinus syndrome: Secondary | ICD-10-CM

## 2017-03-22 DIAGNOSIS — Z950 Presence of cardiac pacemaker: Secondary | ICD-10-CM | POA: Diagnosis not present

## 2017-03-22 DIAGNOSIS — I2510 Atherosclerotic heart disease of native coronary artery without angina pectoris: Secondary | ICD-10-CM | POA: Diagnosis not present

## 2017-03-22 DIAGNOSIS — Z992 Dependence on renal dialysis: Secondary | ICD-10-CM

## 2017-03-22 DIAGNOSIS — I480 Paroxysmal atrial fibrillation: Secondary | ICD-10-CM

## 2017-03-22 NOTE — Patient Instructions (Signed)

## 2017-03-23 ENCOUNTER — Ambulatory Visit (INDEPENDENT_AMBULATORY_CARE_PROVIDER_SITE_OTHER): Payer: Medicare Other | Admitting: *Deleted

## 2017-03-23 ENCOUNTER — Telehealth: Payer: Self-pay | Admitting: Cardiology

## 2017-03-23 DIAGNOSIS — I495 Sick sinus syndrome: Secondary | ICD-10-CM

## 2017-03-23 DIAGNOSIS — D509 Iron deficiency anemia, unspecified: Secondary | ICD-10-CM | POA: Diagnosis not present

## 2017-03-23 DIAGNOSIS — N186 End stage renal disease: Secondary | ICD-10-CM | POA: Diagnosis not present

## 2017-03-23 DIAGNOSIS — D631 Anemia in chronic kidney disease: Secondary | ICD-10-CM | POA: Diagnosis not present

## 2017-03-23 DIAGNOSIS — Z992 Dependence on renal dialysis: Secondary | ICD-10-CM | POA: Diagnosis not present

## 2017-03-23 DIAGNOSIS — N2581 Secondary hyperparathyroidism of renal origin: Secondary | ICD-10-CM | POA: Diagnosis not present

## 2017-03-23 NOTE — Telephone Encounter (Signed)
LMOVM reminding pt to send remote transmission.   

## 2017-03-25 DIAGNOSIS — D631 Anemia in chronic kidney disease: Secondary | ICD-10-CM | POA: Diagnosis not present

## 2017-03-25 DIAGNOSIS — Z992 Dependence on renal dialysis: Secondary | ICD-10-CM | POA: Diagnosis not present

## 2017-03-25 DIAGNOSIS — N2581 Secondary hyperparathyroidism of renal origin: Secondary | ICD-10-CM | POA: Diagnosis not present

## 2017-03-25 DIAGNOSIS — N186 End stage renal disease: Secondary | ICD-10-CM | POA: Diagnosis not present

## 2017-03-25 DIAGNOSIS — D509 Iron deficiency anemia, unspecified: Secondary | ICD-10-CM | POA: Diagnosis not present

## 2017-03-25 NOTE — Progress Notes (Signed)
Remote pacemaker transmission.   

## 2017-03-27 DIAGNOSIS — Z992 Dependence on renal dialysis: Secondary | ICD-10-CM | POA: Diagnosis not present

## 2017-03-27 DIAGNOSIS — N186 End stage renal disease: Secondary | ICD-10-CM | POA: Diagnosis not present

## 2017-03-27 DIAGNOSIS — D631 Anemia in chronic kidney disease: Secondary | ICD-10-CM | POA: Diagnosis not present

## 2017-03-27 DIAGNOSIS — N2581 Secondary hyperparathyroidism of renal origin: Secondary | ICD-10-CM | POA: Diagnosis not present

## 2017-03-27 DIAGNOSIS — D509 Iron deficiency anemia, unspecified: Secondary | ICD-10-CM | POA: Diagnosis not present

## 2017-03-27 LAB — CUP PACEART REMOTE DEVICE CHECK
Brady Statistic AP VS Percent: 98 %
Brady Statistic AS VS Percent: 1 %
Date Time Interrogation Session: 20190306234109
Implantable Lead Implant Date: 20080604
Implantable Lead Location: 753860
Implantable Lead Model: 4469
Implantable Lead Serial Number: 601713
Lead Channel Impedance Value: 485 Ohm
Lead Channel Pacing Threshold Pulse Width: 0.4 ms
Lead Channel Pacing Threshold Pulse Width: 0.4 ms
MDC IDC LEAD IMPLANT DT: 20080604
MDC IDC LEAD LOCATION: 753859
MDC IDC LEAD SERIAL: 498205
MDC IDC MSMT BATTERY IMPEDANCE: 259 Ohm
MDC IDC MSMT BATTERY REMAINING LONGEVITY: 109 mo
MDC IDC MSMT BATTERY VOLTAGE: 2.79 V
MDC IDC MSMT LEADCHNL RA IMPEDANCE VALUE: 402 Ohm
MDC IDC MSMT LEADCHNL RA PACING THRESHOLD AMPLITUDE: 0.625 V
MDC IDC MSMT LEADCHNL RV PACING THRESHOLD AMPLITUDE: 0.5 V
MDC IDC PG IMPLANT DT: 20130212
MDC IDC SET LEADCHNL RA PACING AMPLITUDE: 2 V
MDC IDC SET LEADCHNL RV PACING AMPLITUDE: 2.5 V
MDC IDC SET LEADCHNL RV PACING PULSEWIDTH: 0.4 ms
MDC IDC SET LEADCHNL RV SENSING SENSITIVITY: 5.6 mV
MDC IDC STAT BRADY AP VP PERCENT: 1 %
MDC IDC STAT BRADY AS VP PERCENT: 0 %

## 2017-03-29 ENCOUNTER — Encounter: Payer: Self-pay | Admitting: Cardiology

## 2017-03-30 DIAGNOSIS — D509 Iron deficiency anemia, unspecified: Secondary | ICD-10-CM | POA: Diagnosis not present

## 2017-03-30 DIAGNOSIS — N186 End stage renal disease: Secondary | ICD-10-CM | POA: Diagnosis not present

## 2017-03-30 DIAGNOSIS — D631 Anemia in chronic kidney disease: Secondary | ICD-10-CM | POA: Diagnosis not present

## 2017-03-30 DIAGNOSIS — N2581 Secondary hyperparathyroidism of renal origin: Secondary | ICD-10-CM | POA: Diagnosis not present

## 2017-03-30 DIAGNOSIS — Z992 Dependence on renal dialysis: Secondary | ICD-10-CM | POA: Diagnosis not present

## 2017-03-31 DIAGNOSIS — Z87891 Personal history of nicotine dependence: Secondary | ICD-10-CM | POA: Diagnosis not present

## 2017-03-31 DIAGNOSIS — N185 Chronic kidney disease, stage 5: Secondary | ICD-10-CM | POA: Diagnosis not present

## 2017-03-31 DIAGNOSIS — I4891 Unspecified atrial fibrillation: Secondary | ICD-10-CM | POA: Diagnosis not present

## 2017-03-31 DIAGNOSIS — Z992 Dependence on renal dialysis: Secondary | ICD-10-CM | POA: Diagnosis not present

## 2017-03-31 DIAGNOSIS — Z299 Encounter for prophylactic measures, unspecified: Secondary | ICD-10-CM | POA: Diagnosis not present

## 2017-03-31 DIAGNOSIS — I10 Essential (primary) hypertension: Secondary | ICD-10-CM | POA: Diagnosis not present

## 2017-03-31 DIAGNOSIS — Z6827 Body mass index (BMI) 27.0-27.9, adult: Secondary | ICD-10-CM | POA: Diagnosis not present

## 2017-03-31 DIAGNOSIS — I770 Arteriovenous fistula, acquired: Secondary | ICD-10-CM | POA: Diagnosis not present

## 2017-04-01 DIAGNOSIS — Z992 Dependence on renal dialysis: Secondary | ICD-10-CM | POA: Diagnosis not present

## 2017-04-01 DIAGNOSIS — D509 Iron deficiency anemia, unspecified: Secondary | ICD-10-CM | POA: Diagnosis not present

## 2017-04-01 DIAGNOSIS — D631 Anemia in chronic kidney disease: Secondary | ICD-10-CM | POA: Diagnosis not present

## 2017-04-01 DIAGNOSIS — N2581 Secondary hyperparathyroidism of renal origin: Secondary | ICD-10-CM | POA: Diagnosis not present

## 2017-04-01 DIAGNOSIS — N186 End stage renal disease: Secondary | ICD-10-CM | POA: Diagnosis not present

## 2017-04-03 DIAGNOSIS — D631 Anemia in chronic kidney disease: Secondary | ICD-10-CM | POA: Diagnosis not present

## 2017-04-03 DIAGNOSIS — N186 End stage renal disease: Secondary | ICD-10-CM | POA: Diagnosis not present

## 2017-04-03 DIAGNOSIS — D509 Iron deficiency anemia, unspecified: Secondary | ICD-10-CM | POA: Diagnosis not present

## 2017-04-03 DIAGNOSIS — Z992 Dependence on renal dialysis: Secondary | ICD-10-CM | POA: Diagnosis not present

## 2017-04-03 DIAGNOSIS — N2581 Secondary hyperparathyroidism of renal origin: Secondary | ICD-10-CM | POA: Diagnosis not present

## 2017-04-06 DIAGNOSIS — N2581 Secondary hyperparathyroidism of renal origin: Secondary | ICD-10-CM | POA: Diagnosis not present

## 2017-04-06 DIAGNOSIS — Z992 Dependence on renal dialysis: Secondary | ICD-10-CM | POA: Diagnosis not present

## 2017-04-06 DIAGNOSIS — N186 End stage renal disease: Secondary | ICD-10-CM | POA: Diagnosis not present

## 2017-04-06 DIAGNOSIS — D631 Anemia in chronic kidney disease: Secondary | ICD-10-CM | POA: Diagnosis not present

## 2017-04-06 DIAGNOSIS — D509 Iron deficiency anemia, unspecified: Secondary | ICD-10-CM | POA: Diagnosis not present

## 2017-04-08 DIAGNOSIS — D509 Iron deficiency anemia, unspecified: Secondary | ICD-10-CM | POA: Diagnosis not present

## 2017-04-08 DIAGNOSIS — Z992 Dependence on renal dialysis: Secondary | ICD-10-CM | POA: Diagnosis not present

## 2017-04-08 DIAGNOSIS — D631 Anemia in chronic kidney disease: Secondary | ICD-10-CM | POA: Diagnosis not present

## 2017-04-08 DIAGNOSIS — N2581 Secondary hyperparathyroidism of renal origin: Secondary | ICD-10-CM | POA: Diagnosis not present

## 2017-04-08 DIAGNOSIS — N186 End stage renal disease: Secondary | ICD-10-CM | POA: Diagnosis not present

## 2017-04-10 DIAGNOSIS — Z992 Dependence on renal dialysis: Secondary | ICD-10-CM | POA: Diagnosis not present

## 2017-04-10 DIAGNOSIS — D509 Iron deficiency anemia, unspecified: Secondary | ICD-10-CM | POA: Diagnosis not present

## 2017-04-10 DIAGNOSIS — N2581 Secondary hyperparathyroidism of renal origin: Secondary | ICD-10-CM | POA: Diagnosis not present

## 2017-04-10 DIAGNOSIS — N186 End stage renal disease: Secondary | ICD-10-CM | POA: Diagnosis not present

## 2017-04-10 DIAGNOSIS — D631 Anemia in chronic kidney disease: Secondary | ICD-10-CM | POA: Diagnosis not present

## 2017-04-13 DIAGNOSIS — Z992 Dependence on renal dialysis: Secondary | ICD-10-CM | POA: Diagnosis not present

## 2017-04-13 DIAGNOSIS — N186 End stage renal disease: Secondary | ICD-10-CM | POA: Diagnosis not present

## 2017-04-13 DIAGNOSIS — D631 Anemia in chronic kidney disease: Secondary | ICD-10-CM | POA: Diagnosis not present

## 2017-04-13 DIAGNOSIS — N2581 Secondary hyperparathyroidism of renal origin: Secondary | ICD-10-CM | POA: Diagnosis not present

## 2017-04-13 DIAGNOSIS — D509 Iron deficiency anemia, unspecified: Secondary | ICD-10-CM | POA: Diagnosis not present

## 2017-04-15 DIAGNOSIS — N2581 Secondary hyperparathyroidism of renal origin: Secondary | ICD-10-CM | POA: Diagnosis not present

## 2017-04-15 DIAGNOSIS — Z992 Dependence on renal dialysis: Secondary | ICD-10-CM | POA: Diagnosis not present

## 2017-04-15 DIAGNOSIS — D509 Iron deficiency anemia, unspecified: Secondary | ICD-10-CM | POA: Diagnosis not present

## 2017-04-15 DIAGNOSIS — N186 End stage renal disease: Secondary | ICD-10-CM | POA: Diagnosis not present

## 2017-04-15 DIAGNOSIS — D631 Anemia in chronic kidney disease: Secondary | ICD-10-CM | POA: Diagnosis not present

## 2017-04-17 DIAGNOSIS — D631 Anemia in chronic kidney disease: Secondary | ICD-10-CM | POA: Diagnosis not present

## 2017-04-17 DIAGNOSIS — D509 Iron deficiency anemia, unspecified: Secondary | ICD-10-CM | POA: Diagnosis not present

## 2017-04-17 DIAGNOSIS — Z992 Dependence on renal dialysis: Secondary | ICD-10-CM | POA: Diagnosis not present

## 2017-04-17 DIAGNOSIS — N186 End stage renal disease: Secondary | ICD-10-CM | POA: Diagnosis not present

## 2017-04-17 DIAGNOSIS — N2581 Secondary hyperparathyroidism of renal origin: Secondary | ICD-10-CM | POA: Diagnosis not present

## 2017-04-18 DIAGNOSIS — N186 End stage renal disease: Secondary | ICD-10-CM | POA: Diagnosis not present

## 2017-04-18 DIAGNOSIS — Z992 Dependence on renal dialysis: Secondary | ICD-10-CM | POA: Diagnosis not present

## 2017-04-20 DIAGNOSIS — D509 Iron deficiency anemia, unspecified: Secondary | ICD-10-CM | POA: Diagnosis not present

## 2017-04-20 DIAGNOSIS — N186 End stage renal disease: Secondary | ICD-10-CM | POA: Diagnosis not present

## 2017-04-20 DIAGNOSIS — D631 Anemia in chronic kidney disease: Secondary | ICD-10-CM | POA: Diagnosis not present

## 2017-04-20 DIAGNOSIS — Z992 Dependence on renal dialysis: Secondary | ICD-10-CM | POA: Diagnosis not present

## 2017-04-22 DIAGNOSIS — Z992 Dependence on renal dialysis: Secondary | ICD-10-CM | POA: Diagnosis not present

## 2017-04-22 DIAGNOSIS — N186 End stage renal disease: Secondary | ICD-10-CM | POA: Diagnosis not present

## 2017-04-22 DIAGNOSIS — D509 Iron deficiency anemia, unspecified: Secondary | ICD-10-CM | POA: Diagnosis not present

## 2017-04-22 DIAGNOSIS — D631 Anemia in chronic kidney disease: Secondary | ICD-10-CM | POA: Diagnosis not present

## 2017-04-23 ENCOUNTER — Ambulatory Visit (INDEPENDENT_AMBULATORY_CARE_PROVIDER_SITE_OTHER): Payer: Medicare Other | Admitting: Internal Medicine

## 2017-04-23 ENCOUNTER — Encounter: Payer: Self-pay | Admitting: Internal Medicine

## 2017-04-23 VITALS — BP 116/60 | HR 95 | Ht 70.0 in | Wt 176.4 lb

## 2017-04-23 DIAGNOSIS — I495 Sick sinus syndrome: Secondary | ICD-10-CM | POA: Diagnosis not present

## 2017-04-23 DIAGNOSIS — I480 Paroxysmal atrial fibrillation: Secondary | ICD-10-CM | POA: Diagnosis not present

## 2017-04-23 DIAGNOSIS — I2510 Atherosclerotic heart disease of native coronary artery without angina pectoris: Secondary | ICD-10-CM

## 2017-04-23 NOTE — Patient Instructions (Signed)
Your physician wants you to follow-up in: 1 YEAR WITH DR ALLRED You will receive a reminder letter in the mail two months in advance. If you don't receive a letter, please call our office to schedule the follow-up appointment.  Your physician recommends that you continue on your current medications as directed. Please refer to the Current Medication list given to you today.  Thank you for choosing [facility]!!   

## 2017-04-23 NOTE — Progress Notes (Signed)
PCP: Monico Blitz, MD Primary Cardiologist:  Dr Domenic Polite Primary EP:  Dr Abigail Butts is a 84 y.o. male who presents today for routine electrophysiology followup.  Since last being seen in our clinic, the patient reports doing very well.  Today, he denies symptoms of palpitations, chest pain, shortness of breath,  lower extremity edema, dizziness, presyncope, or syncope.  The patient is otherwise without complaint today.   Past Medical History:  Diagnosis Date  . Anemia    Dr. Sonny Dandy  . Arthritis   . Benign prostatic hypertrophy   . Carpal tunnel syndrome of left wrist   . Coronary atherosclerosis of native coronary artery    Nonobstructive 2009  . ESRD on hemodialysis ([place])   . Essential hypertension, benign   . GERD (gastroesophageal reflux disease)   . Headache   . History of pneumonia   . HOH (hard of hearing)    Bilateral  hearing aids  . Hypothyroidism   . Persistent atrial fibrillation (HCC)    Declines coumadin  . Presence of permanent cardiac pacemaker   . Sick sinus syndrome [facility])    Status post pacemaker placement 2008  . Skin cancer, basal cell    Past Surgical History:  Procedure Laterality Date  . A/V FISTULAGRAM N/A 01/06/2017   Procedure: A/V FISTULAGRAM;  Surgeon: Waynetta Sandy, MD;  Location: [place] [facility];  Service: Cardiovascular;  Laterality: N/A;  . APPENDECTOMY    . AV FISTULA PLACEMENT Left 08/16/2014   Procedure: Left Arm creation of Brachiocephalic ARTERIOVENOUS (AV) FISTULA ;  Surgeon: Angelia Mould, MD;  Location: [place];  Service: Vascular;  Laterality: Left;  . AV FISTULA PLACEMENT Right 06/24/2016   Procedure: CREATION OF RIGHT RADIOCEPHALIC ARTERIOVENOUS (AV);  Surgeon: Rosetta Posner, MD;  Location: [place];  Service: Vascular;  Laterality: Right;  . CHOLECYSTECTOMY    . COLONOSCOPY W/ BIOPSIES AND POLYPECTOMY    . EYE SURGERY    . INSERTION OF DIALYSIS CATHETER Left 06/24/2016   Procedure: INSERTION OF DIALYSIS  CATHETER - left Internal Jugular placement;  Surgeon: Rosetta Posner, MD;  Location: [place];  Service: Vascular;  Laterality: Left;  . IR FLUORO GUIDE CV LINE RIGHT  08/06/2016  . LAMINECTOMY    . LIGATION OF ARTERIOVENOUS  FISTULA Left 06/24/2016   Procedure: LIGATION OF left arm  ARTERIOVENOUS  FISTULA;  Surgeon: Rosetta Posner, MD;  Location: [place];  Service: Vascular;  Laterality: Left;  . PACEMAKER GENERATOR CHANGE  03/03/11   MDT Adaptal L implanted by Dr Rayann Heman  . PACEMAKER PLACEMENT  2008   Medtronic - Dr. Doreatha Lew  . PERIPHERAL VASCULAR CATHETERIZATION Left 03/06/2015   Procedure: Fistulagram;  Surgeon: Serafina Mitchell, MD;  Location: [place] CV LAB;  Service: Cardiovascular;  Laterality: Left;  . PERMANENT PACEMAKER GENERATOR CHANGE N/A 03/03/2011   Procedure: PERMANENT PACEMAKER GENERATOR CHANGE;  Surgeon: Thompson Grayer, MD;  Location: [facility] CATH LAB;  Service: Cardiovascular;  Laterality: N/A;  . Right rotator cuff repair    . THROMBECTOMY AND REVISION OF ARTERIOVENTOUS (AV) GORETEX  GRAFT Left 03/11/2015   Procedure: THROMBECTOMY AND REVISION OF ARTERIOVENTOUS (AV) GORETEX  GRAFT LEFT ARM;  Surgeon: Rosetta Posner, MD;  Location: [place];  Service: Vascular;  Laterality: Left;  . TONSILLECTOMY      ROS- all systems are reviewed and negative except as per HPI above  Current Outpatient Medications  Medication Sig Dispense Refill  . amiodarone (PACERONE) 200  MG tablet Take 200 mg by mouth daily.    Marland Kitchen aspirin EC 81 MG tablet Take 81 mg by mouth at bedtime.    Marland Kitchen atenolol (TENORMIN) 25 MG tablet Take 12.5 mg by mouth daily.    Lorin Picket 1 GM 210 MG(Fe) tablet Place 1 tablet inside cheek 2 (two) times daily.    Marland Kitchen docusate sodium (COLACE) 100 MG capsule Take 100 mg by mouth daily.     . folic acid (FOLVITE) 1 MG tablet Take 1 mg by mouth daily.      Marland Kitchen gabapentin (NEURONTIN) 400 MG capsule Take 400 mg by mouth at bedtime.     Marland Kitchen HYDROcodone-acetaminophen (NORCO) 10-325 MG tablet Take 1 tablet by  mouth every 6 (six) hours as needed for severe pain. (Patient taking differently: Take 1 tablet by mouth 3 (three) times daily. ) 15 tablet 0  . levothyroxine (SYNTHROID, LEVOTHROID) 125 MCG tablet Take 125 mcg by mouth daily at 2 PM. In the afternoon.    . Melatonin 5 MG TABS Take 5 mg by mouth at bedtime.    . midodrine (PROAMATINE) 10 MG tablet Take 10 mg by mouth 2 (two) times daily.     . naproxen sodium (ALEVE) 220 MG tablet Take 440 mg by mouth daily as needed (for pain.).    Marland Kitchen nitroGLYCERIN (NITROSTAT) 0.4 MG SL tablet Place 1 tablet (0.4 mg total) under the tongue every 5 (five) minutes as needed. (Patient taking differently: Place 0.4 mg under the tongue every 5 (five) minutes as needed for chest pain. ) 25 tablet 2  . omeprazole (PRILOSEC) 20 MG capsule Take 20 mg by mouth at bedtime.     . sevelamer carbonate (RENVELA) 800 MG tablet Take 800-1,600 mg by mouth. Take 800 mg-1600 mg with each meal and 800mg  with each snack    . tamsulosin (FLOMAX) 0.4 MG CAPS capsule Take 0.4 mg by mouth at bedtime.     No current facility-administered medications for this visit.     Physical Exam: Vitals:   04/23/17 0955  BP: 116/60  Pulse: 95  SpO2: 95%  Weight: 176 lb 6.4 oz (80 kg)  Height: 5\' 10"  (1.778 m)    GEN- The patient is well appearing, alert and oriented x 3 today.   Head- normocephalic, atraumatic Eyes-  Sclera clear, conjunctiva pink Ears- hearing intact Oropharynx- clear Lungs- Clear to ausculation bilaterally, normal work of breathing Chest- pacemaker pocket is well healed Heart- Regular rate and rhythm, no murmurs, rubs or gallops, PMI not laterally displaced GI- soft, NT, ND, + BS Extremities- no clubbing, cyanosis, or edema  Pacemaker interrogation- reviewed in detail today,  See PACEART report   Assessment and Plan:  1. Symptomatic sinus bradycardia  Normal pacemaker function See Pace Art report No changes today  2. afib afib burdne is 1.8 % (33% last  year) Declines anticoagulation He has been in persistent afib since 04/16/17 On amioadrone.  PCP following LFTs, TFTs  carelink Return in 1 year Follow-up with Dr Domenic Polite as scheduled  Thompson Grayer MD, [facility] 04/23/2017 10:21 AM

## 2017-04-24 DIAGNOSIS — D631 Anemia in chronic kidney disease: Secondary | ICD-10-CM | POA: Diagnosis not present

## 2017-04-24 DIAGNOSIS — N186 End stage renal disease: Secondary | ICD-10-CM | POA: Diagnosis not present

## 2017-04-24 DIAGNOSIS — D509 Iron deficiency anemia, unspecified: Secondary | ICD-10-CM | POA: Diagnosis not present

## 2017-04-24 DIAGNOSIS — Z992 Dependence on renal dialysis: Secondary | ICD-10-CM | POA: Diagnosis not present

## 2017-04-27 DIAGNOSIS — Z992 Dependence on renal dialysis: Secondary | ICD-10-CM | POA: Diagnosis not present

## 2017-04-27 DIAGNOSIS — D509 Iron deficiency anemia, unspecified: Secondary | ICD-10-CM | POA: Diagnosis not present

## 2017-04-27 DIAGNOSIS — N186 End stage renal disease: Secondary | ICD-10-CM | POA: Diagnosis not present

## 2017-04-27 DIAGNOSIS — D631 Anemia in chronic kidney disease: Secondary | ICD-10-CM | POA: Diagnosis not present

## 2017-04-29 DIAGNOSIS — D509 Iron deficiency anemia, unspecified: Secondary | ICD-10-CM | POA: Diagnosis not present

## 2017-04-29 DIAGNOSIS — D631 Anemia in chronic kidney disease: Secondary | ICD-10-CM | POA: Diagnosis not present

## 2017-04-29 DIAGNOSIS — Z992 Dependence on renal dialysis: Secondary | ICD-10-CM | POA: Diagnosis not present

## 2017-04-29 DIAGNOSIS — N186 End stage renal disease: Secondary | ICD-10-CM | POA: Diagnosis not present

## 2017-04-30 DIAGNOSIS — Z6827 Body mass index (BMI) 27.0-27.9, adult: Secondary | ICD-10-CM | POA: Diagnosis not present

## 2017-04-30 DIAGNOSIS — Z299 Encounter for prophylactic measures, unspecified: Secondary | ICD-10-CM | POA: Diagnosis not present

## 2017-04-30 DIAGNOSIS — I4891 Unspecified atrial fibrillation: Secondary | ICD-10-CM | POA: Diagnosis not present

## 2017-04-30 DIAGNOSIS — I10 Essential (primary) hypertension: Secondary | ICD-10-CM | POA: Diagnosis not present

## 2017-04-30 DIAGNOSIS — M549 Dorsalgia, unspecified: Secondary | ICD-10-CM | POA: Diagnosis not present

## 2017-04-30 DIAGNOSIS — Z79899 Other long term (current) drug therapy: Secondary | ICD-10-CM | POA: Diagnosis not present

## 2017-04-30 DIAGNOSIS — N185 Chronic kidney disease, stage 5: Secondary | ICD-10-CM | POA: Diagnosis not present

## 2017-04-30 DIAGNOSIS — I770 Arteriovenous fistula, acquired: Secondary | ICD-10-CM | POA: Diagnosis not present

## 2017-04-30 DIAGNOSIS — E039 Hypothyroidism, unspecified: Secondary | ICD-10-CM | POA: Diagnosis not present

## 2017-05-01 DIAGNOSIS — Z992 Dependence on renal dialysis: Secondary | ICD-10-CM | POA: Diagnosis not present

## 2017-05-01 DIAGNOSIS — N186 End stage renal disease: Secondary | ICD-10-CM | POA: Diagnosis not present

## 2017-05-01 DIAGNOSIS — D509 Iron deficiency anemia, unspecified: Secondary | ICD-10-CM | POA: Diagnosis not present

## 2017-05-01 DIAGNOSIS — D631 Anemia in chronic kidney disease: Secondary | ICD-10-CM | POA: Diagnosis not present

## 2017-05-04 DIAGNOSIS — D509 Iron deficiency anemia, unspecified: Secondary | ICD-10-CM | POA: Diagnosis not present

## 2017-05-04 DIAGNOSIS — N186 End stage renal disease: Secondary | ICD-10-CM | POA: Diagnosis not present

## 2017-05-04 DIAGNOSIS — D631 Anemia in chronic kidney disease: Secondary | ICD-10-CM | POA: Diagnosis not present

## 2017-05-04 DIAGNOSIS — Z992 Dependence on renal dialysis: Secondary | ICD-10-CM | POA: Diagnosis not present

## 2017-05-06 DIAGNOSIS — N186 End stage renal disease: Secondary | ICD-10-CM | POA: Diagnosis not present

## 2017-05-06 DIAGNOSIS — D631 Anemia in chronic kidney disease: Secondary | ICD-10-CM | POA: Diagnosis not present

## 2017-05-06 DIAGNOSIS — Z992 Dependence on renal dialysis: Secondary | ICD-10-CM | POA: Diagnosis not present

## 2017-05-06 DIAGNOSIS — D509 Iron deficiency anemia, unspecified: Secondary | ICD-10-CM | POA: Diagnosis not present

## 2017-05-06 LAB — CUP PACEART INCLINIC DEVICE CHECK
Implantable Lead Implant Date: 20080604
Implantable Lead Implant Date: 20080604
Implantable Lead Location: 753860
Implantable Lead Model: 4469
Implantable Lead Serial Number: 498205
Implantable Lead Serial Number: 601713
MDC IDC LEAD LOCATION: 753859
MDC IDC PG IMPLANT DT: 20130212
MDC IDC SESS DTM: 20190418132149

## 2017-05-08 DIAGNOSIS — D631 Anemia in chronic kidney disease: Secondary | ICD-10-CM | POA: Diagnosis not present

## 2017-05-08 DIAGNOSIS — D509 Iron deficiency anemia, unspecified: Secondary | ICD-10-CM | POA: Diagnosis not present

## 2017-05-08 DIAGNOSIS — Z992 Dependence on renal dialysis: Secondary | ICD-10-CM | POA: Diagnosis not present

## 2017-05-08 DIAGNOSIS — N186 End stage renal disease: Secondary | ICD-10-CM | POA: Diagnosis not present

## 2017-05-11 DIAGNOSIS — D509 Iron deficiency anemia, unspecified: Secondary | ICD-10-CM | POA: Diagnosis not present

## 2017-05-11 DIAGNOSIS — N186 End stage renal disease: Secondary | ICD-10-CM | POA: Diagnosis not present

## 2017-05-11 DIAGNOSIS — D631 Anemia in chronic kidney disease: Secondary | ICD-10-CM | POA: Diagnosis not present

## 2017-05-11 DIAGNOSIS — Z992 Dependence on renal dialysis: Secondary | ICD-10-CM | POA: Diagnosis not present

## 2017-05-13 DIAGNOSIS — Z992 Dependence on renal dialysis: Secondary | ICD-10-CM | POA: Diagnosis not present

## 2017-05-13 DIAGNOSIS — D509 Iron deficiency anemia, unspecified: Secondary | ICD-10-CM | POA: Diagnosis not present

## 2017-05-13 DIAGNOSIS — D631 Anemia in chronic kidney disease: Secondary | ICD-10-CM | POA: Diagnosis not present

## 2017-05-13 DIAGNOSIS — N186 End stage renal disease: Secondary | ICD-10-CM | POA: Diagnosis not present

## 2017-05-15 DIAGNOSIS — N186 End stage renal disease: Secondary | ICD-10-CM | POA: Diagnosis not present

## 2017-05-15 DIAGNOSIS — D631 Anemia in chronic kidney disease: Secondary | ICD-10-CM | POA: Diagnosis not present

## 2017-05-15 DIAGNOSIS — D509 Iron deficiency anemia, unspecified: Secondary | ICD-10-CM | POA: Diagnosis not present

## 2017-05-15 DIAGNOSIS — Z992 Dependence on renal dialysis: Secondary | ICD-10-CM | POA: Diagnosis not present

## 2017-05-18 DIAGNOSIS — D509 Iron deficiency anemia, unspecified: Secondary | ICD-10-CM | POA: Diagnosis not present

## 2017-05-18 DIAGNOSIS — Z992 Dependence on renal dialysis: Secondary | ICD-10-CM | POA: Diagnosis not present

## 2017-05-18 DIAGNOSIS — N390 Urinary tract infection, site not specified: Secondary | ICD-10-CM | POA: Diagnosis not present

## 2017-05-18 DIAGNOSIS — I120 Hypertensive chronic kidney disease with stage 5 chronic kidney disease or end stage renal disease: Secondary | ICD-10-CM | POA: Diagnosis not present

## 2017-05-18 DIAGNOSIS — N186 End stage renal disease: Secondary | ICD-10-CM | POA: Diagnosis not present

## 2017-05-18 DIAGNOSIS — I480 Paroxysmal atrial fibrillation: Secondary | ICD-10-CM | POA: Diagnosis not present

## 2017-05-18 DIAGNOSIS — R4182 Altered mental status, unspecified: Secondary | ICD-10-CM | POA: Diagnosis not present

## 2017-05-18 DIAGNOSIS — D631 Anemia in chronic kidney disease: Secondary | ICD-10-CM | POA: Diagnosis not present

## 2017-05-18 DIAGNOSIS — R509 Fever, unspecified: Secondary | ICD-10-CM | POA: Diagnosis not present

## 2017-05-18 DIAGNOSIS — R531 Weakness: Secondary | ICD-10-CM | POA: Diagnosis not present

## 2017-05-18 DIAGNOSIS — A419 Sepsis, unspecified organism: Secondary | ICD-10-CM | POA: Diagnosis not present

## 2017-05-19 DIAGNOSIS — Z992 Dependence on renal dialysis: Secondary | ICD-10-CM | POA: Diagnosis not present

## 2017-05-19 DIAGNOSIS — K219 Gastro-esophageal reflux disease without esophagitis: Secondary | ICD-10-CM | POA: Diagnosis present

## 2017-05-19 DIAGNOSIS — B9689 Other specified bacterial agents as the cause of diseases classified elsewhere: Secondary | ICD-10-CM | POA: Diagnosis present

## 2017-05-19 DIAGNOSIS — R509 Fever, unspecified: Secondary | ICD-10-CM | POA: Diagnosis not present

## 2017-05-19 DIAGNOSIS — Z7982 Long term (current) use of aspirin: Secondary | ICD-10-CM | POA: Diagnosis not present

## 2017-05-19 DIAGNOSIS — N400 Enlarged prostate without lower urinary tract symptoms: Secondary | ICD-10-CM | POA: Diagnosis present

## 2017-05-19 DIAGNOSIS — Z950 Presence of cardiac pacemaker: Secondary | ICD-10-CM | POA: Diagnosis not present

## 2017-05-19 DIAGNOSIS — N390 Urinary tract infection, site not specified: Secondary | ICD-10-CM | POA: Diagnosis not present

## 2017-05-19 DIAGNOSIS — I10 Essential (primary) hypertension: Secondary | ICD-10-CM | POA: Diagnosis not present

## 2017-05-19 DIAGNOSIS — D649 Anemia, unspecified: Secondary | ICD-10-CM | POA: Diagnosis present

## 2017-05-19 DIAGNOSIS — Z9181 History of falling: Secondary | ICD-10-CM | POA: Diagnosis not present

## 2017-05-19 DIAGNOSIS — E039 Hypothyroidism, unspecified: Secondary | ICD-10-CM | POA: Diagnosis present

## 2017-05-19 DIAGNOSIS — I2510 Atherosclerotic heart disease of native coronary artery without angina pectoris: Secondary | ICD-10-CM | POA: Diagnosis present

## 2017-05-19 DIAGNOSIS — Z79899 Other long term (current) drug therapy: Secondary | ICD-10-CM | POA: Diagnosis not present

## 2017-05-19 DIAGNOSIS — I480 Paroxysmal atrial fibrillation: Secondary | ICD-10-CM | POA: Diagnosis present

## 2017-05-19 DIAGNOSIS — N186 End stage renal disease: Secondary | ICD-10-CM | POA: Diagnosis not present

## 2017-05-19 DIAGNOSIS — I120 Hypertensive chronic kidney disease with stage 5 chronic kidney disease or end stage renal disease: Secondary | ICD-10-CM | POA: Diagnosis present

## 2017-05-19 DIAGNOSIS — R4182 Altered mental status, unspecified: Secondary | ICD-10-CM | POA: Diagnosis not present

## 2017-05-19 DIAGNOSIS — I959 Hypotension, unspecified: Secondary | ICD-10-CM | POA: Diagnosis present

## 2017-05-19 DIAGNOSIS — A419 Sepsis, unspecified organism: Secondary | ICD-10-CM | POA: Diagnosis not present

## 2017-05-22 DIAGNOSIS — N186 End stage renal disease: Secondary | ICD-10-CM | POA: Diagnosis not present

## 2017-05-22 DIAGNOSIS — D631 Anemia in chronic kidney disease: Secondary | ICD-10-CM | POA: Diagnosis not present

## 2017-05-22 DIAGNOSIS — Z992 Dependence on renal dialysis: Secondary | ICD-10-CM | POA: Diagnosis not present

## 2017-05-22 DIAGNOSIS — D509 Iron deficiency anemia, unspecified: Secondary | ICD-10-CM | POA: Diagnosis not present

## 2017-05-25 DIAGNOSIS — N186 End stage renal disease: Secondary | ICD-10-CM | POA: Diagnosis not present

## 2017-05-25 DIAGNOSIS — D509 Iron deficiency anemia, unspecified: Secondary | ICD-10-CM | POA: Diagnosis not present

## 2017-05-25 DIAGNOSIS — D631 Anemia in chronic kidney disease: Secondary | ICD-10-CM | POA: Diagnosis not present

## 2017-05-25 DIAGNOSIS — Z992 Dependence on renal dialysis: Secondary | ICD-10-CM | POA: Diagnosis not present

## 2017-05-27 DIAGNOSIS — D509 Iron deficiency anemia, unspecified: Secondary | ICD-10-CM | POA: Diagnosis not present

## 2017-05-27 DIAGNOSIS — N186 End stage renal disease: Secondary | ICD-10-CM | POA: Diagnosis not present

## 2017-05-27 DIAGNOSIS — D631 Anemia in chronic kidney disease: Secondary | ICD-10-CM | POA: Diagnosis not present

## 2017-05-27 DIAGNOSIS — Z992 Dependence on renal dialysis: Secondary | ICD-10-CM | POA: Diagnosis not present

## 2017-05-28 DIAGNOSIS — N390 Urinary tract infection, site not specified: Secondary | ICD-10-CM | POA: Diagnosis not present

## 2017-05-28 DIAGNOSIS — Z6827 Body mass index (BMI) 27.0-27.9, adult: Secondary | ICD-10-CM | POA: Diagnosis not present

## 2017-05-28 DIAGNOSIS — M461 Sacroiliitis, not elsewhere classified: Secondary | ICD-10-CM | POA: Diagnosis not present

## 2017-05-28 DIAGNOSIS — Z299 Encounter for prophylactic measures, unspecified: Secondary | ICD-10-CM | POA: Diagnosis not present

## 2017-05-28 DIAGNOSIS — I4891 Unspecified atrial fibrillation: Secondary | ICD-10-CM | POA: Diagnosis not present

## 2017-05-28 DIAGNOSIS — M549 Dorsalgia, unspecified: Secondary | ICD-10-CM | POA: Diagnosis not present

## 2017-05-28 DIAGNOSIS — I770 Arteriovenous fistula, acquired: Secondary | ICD-10-CM | POA: Diagnosis not present

## 2017-05-28 DIAGNOSIS — N185 Chronic kidney disease, stage 5: Secondary | ICD-10-CM | POA: Diagnosis not present

## 2017-05-29 DIAGNOSIS — N186 End stage renal disease: Secondary | ICD-10-CM | POA: Diagnosis not present

## 2017-05-29 DIAGNOSIS — D631 Anemia in chronic kidney disease: Secondary | ICD-10-CM | POA: Diagnosis not present

## 2017-05-29 DIAGNOSIS — D509 Iron deficiency anemia, unspecified: Secondary | ICD-10-CM | POA: Diagnosis not present

## 2017-05-29 DIAGNOSIS — Z992 Dependence on renal dialysis: Secondary | ICD-10-CM | POA: Diagnosis not present

## 2017-06-01 DIAGNOSIS — N186 End stage renal disease: Secondary | ICD-10-CM | POA: Diagnosis not present

## 2017-06-01 DIAGNOSIS — Z992 Dependence on renal dialysis: Secondary | ICD-10-CM | POA: Diagnosis not present

## 2017-06-01 DIAGNOSIS — D509 Iron deficiency anemia, unspecified: Secondary | ICD-10-CM | POA: Diagnosis not present

## 2017-06-01 DIAGNOSIS — D631 Anemia in chronic kidney disease: Secondary | ICD-10-CM | POA: Diagnosis not present

## 2017-06-03 DIAGNOSIS — Z992 Dependence on renal dialysis: Secondary | ICD-10-CM | POA: Diagnosis not present

## 2017-06-03 DIAGNOSIS — D509 Iron deficiency anemia, unspecified: Secondary | ICD-10-CM | POA: Diagnosis not present

## 2017-06-03 DIAGNOSIS — D631 Anemia in chronic kidney disease: Secondary | ICD-10-CM | POA: Diagnosis not present

## 2017-06-03 DIAGNOSIS — N186 End stage renal disease: Secondary | ICD-10-CM | POA: Diagnosis not present

## 2017-06-05 DIAGNOSIS — I2510 Atherosclerotic heart disease of native coronary artery without angina pectoris: Secondary | ICD-10-CM | POA: Diagnosis not present

## 2017-06-05 DIAGNOSIS — Z87891 Personal history of nicotine dependence: Secondary | ICD-10-CM | POA: Diagnosis not present

## 2017-06-05 DIAGNOSIS — N400 Enlarged prostate without lower urinary tract symptoms: Secondary | ICD-10-CM | POA: Diagnosis not present

## 2017-06-05 DIAGNOSIS — N19 Unspecified kidney failure: Secondary | ICD-10-CM | POA: Diagnosis not present

## 2017-06-05 DIAGNOSIS — R5383 Other fatigue: Secondary | ICD-10-CM | POA: Diagnosis not present

## 2017-06-05 DIAGNOSIS — I4891 Unspecified atrial fibrillation: Secondary | ICD-10-CM | POA: Diagnosis not present

## 2017-06-05 DIAGNOSIS — Z992 Dependence on renal dialysis: Secondary | ICD-10-CM | POA: Diagnosis not present

## 2017-06-05 DIAGNOSIS — R748 Abnormal levels of other serum enzymes: Secondary | ICD-10-CM | POA: Diagnosis not present

## 2017-06-05 DIAGNOSIS — Z79899 Other long term (current) drug therapy: Secondary | ICD-10-CM | POA: Diagnosis not present

## 2017-06-05 DIAGNOSIS — Z91040 Latex allergy status: Secondary | ICD-10-CM | POA: Diagnosis not present

## 2017-06-05 DIAGNOSIS — R001 Bradycardia, unspecified: Secondary | ICD-10-CM | POA: Diagnosis not present

## 2017-06-05 DIAGNOSIS — I509 Heart failure, unspecified: Secondary | ICD-10-CM | POA: Diagnosis not present

## 2017-06-05 DIAGNOSIS — R05 Cough: Secondary | ICD-10-CM | POA: Diagnosis not present

## 2017-06-05 DIAGNOSIS — I495 Sick sinus syndrome: Secondary | ICD-10-CM | POA: Diagnosis not present

## 2017-06-05 DIAGNOSIS — I959 Hypotension, unspecified: Secondary | ICD-10-CM | POA: Diagnosis not present

## 2017-06-05 DIAGNOSIS — Z950 Presence of cardiac pacemaker: Secondary | ICD-10-CM | POA: Diagnosis not present

## 2017-06-05 DIAGNOSIS — Z7982 Long term (current) use of aspirin: Secondary | ICD-10-CM | POA: Diagnosis not present

## 2017-06-05 DIAGNOSIS — Z79891 Long term (current) use of opiate analgesic: Secondary | ICD-10-CM | POA: Diagnosis not present

## 2017-06-05 DIAGNOSIS — N186 End stage renal disease: Secondary | ICD-10-CM | POA: Diagnosis not present

## 2017-06-05 DIAGNOSIS — E8770 Fluid overload, unspecified: Secondary | ICD-10-CM | POA: Diagnosis not present

## 2017-06-05 DIAGNOSIS — I132 Hypertensive heart and chronic kidney disease with heart failure and with stage 5 chronic kidney disease, or end stage renal disease: Secondary | ICD-10-CM | POA: Diagnosis not present

## 2017-06-05 DIAGNOSIS — R7989 Other specified abnormal findings of blood chemistry: Secondary | ICD-10-CM | POA: Diagnosis not present

## 2017-06-05 DIAGNOSIS — Z885 Allergy status to narcotic agent status: Secondary | ICD-10-CM | POA: Diagnosis not present

## 2017-06-05 DIAGNOSIS — D631 Anemia in chronic kidney disease: Secondary | ICD-10-CM | POA: Diagnosis not present

## 2017-06-05 DIAGNOSIS — R5381 Other malaise: Secondary | ICD-10-CM | POA: Diagnosis not present

## 2017-06-05 DIAGNOSIS — E039 Hypothyroidism, unspecified: Secondary | ICD-10-CM | POA: Diagnosis not present

## 2017-06-05 DIAGNOSIS — R0789 Other chest pain: Secondary | ICD-10-CM | POA: Diagnosis not present

## 2017-06-05 DIAGNOSIS — I120 Hypertensive chronic kidney disease with stage 5 chronic kidney disease or end stage renal disease: Secondary | ICD-10-CM | POA: Diagnosis not present

## 2017-06-05 DIAGNOSIS — Z8249 Family history of ischemic heart disease and other diseases of the circulatory system: Secondary | ICD-10-CM | POA: Diagnosis not present

## 2017-06-05 DIAGNOSIS — Z978 Presence of other specified devices: Secondary | ICD-10-CM | POA: Diagnosis not present

## 2017-06-05 DIAGNOSIS — R079 Chest pain, unspecified: Secondary | ICD-10-CM | POA: Diagnosis not present

## 2017-06-06 DIAGNOSIS — E8770 Fluid overload, unspecified: Secondary | ICD-10-CM | POA: Diagnosis not present

## 2017-06-06 DIAGNOSIS — Z992 Dependence on renal dialysis: Secondary | ICD-10-CM | POA: Diagnosis not present

## 2017-06-06 DIAGNOSIS — I4891 Unspecified atrial fibrillation: Secondary | ICD-10-CM | POA: Diagnosis not present

## 2017-06-06 DIAGNOSIS — N186 End stage renal disease: Secondary | ICD-10-CM | POA: Diagnosis not present

## 2017-06-06 DIAGNOSIS — I10 Essential (primary) hypertension: Secondary | ICD-10-CM | POA: Diagnosis not present

## 2017-06-06 DIAGNOSIS — R079 Chest pain, unspecified: Secondary | ICD-10-CM | POA: Diagnosis not present

## 2017-06-06 DIAGNOSIS — I495 Sick sinus syndrome: Secondary | ICD-10-CM | POA: Diagnosis not present

## 2017-06-06 DIAGNOSIS — I2510 Atherosclerotic heart disease of native coronary artery without angina pectoris: Secondary | ICD-10-CM | POA: Diagnosis not present

## 2017-06-06 DIAGNOSIS — R0789 Other chest pain: Secondary | ICD-10-CM | POA: Diagnosis not present

## 2017-06-07 DIAGNOSIS — I770 Arteriovenous fistula, acquired: Secondary | ICD-10-CM | POA: Diagnosis not present

## 2017-06-07 DIAGNOSIS — Z299 Encounter for prophylactic measures, unspecified: Secondary | ICD-10-CM | POA: Diagnosis not present

## 2017-06-07 DIAGNOSIS — Z992 Dependence on renal dialysis: Secondary | ICD-10-CM | POA: Diagnosis not present

## 2017-06-07 DIAGNOSIS — M461 Sacroiliitis, not elsewhere classified: Secondary | ICD-10-CM | POA: Diagnosis not present

## 2017-06-07 DIAGNOSIS — N185 Chronic kidney disease, stage 5: Secondary | ICD-10-CM | POA: Diagnosis not present

## 2017-06-07 DIAGNOSIS — R9430 Abnormal result of cardiovascular function study, unspecified: Secondary | ICD-10-CM | POA: Diagnosis not present

## 2017-06-07 DIAGNOSIS — Z6827 Body mass index (BMI) 27.0-27.9, adult: Secondary | ICD-10-CM | POA: Diagnosis not present

## 2017-06-07 DIAGNOSIS — I10 Essential (primary) hypertension: Secondary | ICD-10-CM | POA: Diagnosis not present

## 2017-06-07 MED ORDER — TAMSULOSIN HCL 0.4 MG PO CAPS
.40 | ORAL_CAPSULE | ORAL | Status: DC
Start: 2017-06-06 — End: 2017-06-07

## 2017-06-07 MED ORDER — MIDODRINE HCL 10 MG PO TABS
10.00 | ORAL_TABLET | ORAL | Status: DC
Start: 2017-06-07 — End: 2017-06-07

## 2017-06-07 MED ORDER — GENERIC EXTERNAL MEDICATION
2.10 | Status: DC
Start: ? — End: 2017-06-07

## 2017-06-07 MED ORDER — ACETAMINOPHEN 325 MG PO TABS
650.00 | ORAL_TABLET | ORAL | Status: DC
Start: ? — End: 2017-06-07

## 2017-06-07 MED ORDER — MIDODRINE HCL 10 MG PO TABS
10.00 | ORAL_TABLET | ORAL | Status: DC
Start: ? — End: 2017-06-07

## 2017-06-07 MED ORDER — AMIODARONE HCL 200 MG PO TABS
200.00 | ORAL_TABLET | ORAL | Status: DC
Start: 2017-06-07 — End: 2017-06-07

## 2017-06-07 MED ORDER — FOLIC ACID 1 MG PO TABS
1.00 | ORAL_TABLET | ORAL | Status: DC
Start: 2017-06-07 — End: 2017-06-07

## 2017-06-07 MED ORDER — GABAPENTIN 400 MG PO CAPS
400.00 | ORAL_CAPSULE | ORAL | Status: DC
Start: 2017-06-06 — End: 2017-06-07

## 2017-06-07 MED ORDER — PANTOPRAZOLE SODIUM 20 MG PO TBEC
20.00 | DELAYED_RELEASE_TABLET | ORAL | Status: DC
Start: 2017-06-07 — End: 2017-06-07

## 2017-06-07 MED ORDER — HEPARIN SODIUM (PORCINE) 5000 UNIT/ML IJ SOLN
5000.00 | INTRAMUSCULAR | Status: DC
Start: 2017-06-06 — End: 2017-06-07

## 2017-06-07 MED ORDER — ASPIRIN 81 MG PO CHEW
81.00 | CHEWABLE_TABLET | ORAL | Status: DC
Start: 2017-06-07 — End: 2017-06-07

## 2017-06-07 MED ORDER — MELATONIN 3 MG PO TABS
3.00 | ORAL_TABLET | ORAL | Status: DC
Start: 2017-06-06 — End: 2017-06-07

## 2017-06-07 MED ORDER — ALBUMIN HUMAN 25 % IV SOLN
25.00 | INTRAVENOUS | Status: DC
Start: ? — End: 2017-06-07

## 2017-06-07 MED ORDER — NITROGLYCERIN 0.4 MG SL SUBL
0.40 | SUBLINGUAL_TABLET | SUBLINGUAL | Status: DC
Start: ? — End: 2017-06-07

## 2017-06-07 MED ORDER — GENERIC EXTERNAL MEDICATION
125.00 | Status: DC
Start: 2017-06-07 — End: 2017-06-07

## 2017-06-07 MED ORDER — DOCUSATE SODIUM 100 MG PO CAPS
100.00 | ORAL_CAPSULE | ORAL | Status: DC
Start: 2017-06-07 — End: 2017-06-07

## 2017-06-07 NOTE — Progress Notes (Deleted)
Cardiology Office Note  Date: 06/07/2017   ID: Bowden, Boody 1925/06/01, MRN [id number]  PCP: Monico Blitz, MD  Primary Cardiologist: Rozann Lesches, MD   No chief complaint on file.   History of Present Illness: Dale Torres is an 84 y.o. male last seen in March.  Records indicate recent ER visit at [facility] on May 18.  He was evaluated for chest pain in the setting of volume overload.  He had transient mild elevation in troponin level, underwent hemodialysis for volume stabilization, was also reportedly seen by cardiology.  Echocardiogram from May 19 reported LVEF 25 to 30% representing a decrease compared to previous evaluation.  No additional inpatient cardiac evaluation was pursued.  He follows in the device clinic with Dr. Rayann Heman, Medtronic pacemaker in place.  Device interrogation in April indicated 1.8% AF burden.  Past Medical History:  Diagnosis Date  . Anemia    Dr. Sonny Dandy  . Arthritis   . Benign prostatic hypertrophy   . Carpal tunnel syndrome of left wrist   . Coronary atherosclerosis of native coronary artery    Nonobstructive 2009  . ESRD on hemodialysis ([place])   . Essential hypertension, benign   . GERD (gastroesophageal reflux disease)   . Headache   . History of pneumonia   . HOH (hard of hearing)    Bilateral  hearing aids  . Hypothyroidism   . Persistent atrial fibrillation (HCC)    Declines coumadin  . Presence of permanent cardiac pacemaker   . Sick sinus syndrome [facility])    Status post pacemaker placement 2008  . Skin cancer, basal cell     Past Surgical History:  Procedure Laterality Date  . A/V FISTULAGRAM N/A 01/06/2017   Procedure: A/V FISTULAGRAM;  Surgeon: Waynetta Sandy, MD;  Location: [place] CV LAB;  Service: Cardiovascular;  Laterality: N/A;  . APPENDECTOMY    . AV FISTULA PLACEMENT Left 08/16/2014   Procedure: Left Arm creation of Brachiocephalic ARTERIOVENOUS (AV) FISTULA ;  Surgeon:  Angelia Mould, MD;  Location: [place];  Service: Vascular;  Laterality: Left;  . AV FISTULA PLACEMENT Right 06/24/2016   Procedure: CREATION OF RIGHT RADIOCEPHALIC ARTERIOVENOUS (AV);  Surgeon: Rosetta Posner, MD;  Location: [place];  Service: Vascular;  Laterality: Right;  . CHOLECYSTECTOMY    . COLONOSCOPY W/ BIOPSIES AND POLYPECTOMY    . EYE SURGERY    . INSERTION OF DIALYSIS CATHETER Left 06/24/2016   Procedure: INSERTION OF DIALYSIS CATHETER - left Internal Jugular placement;  Surgeon: Rosetta Posner, MD;  Location: [place];  Service: Vascular;  Laterality: Left;  . IR FLUORO GUIDE CV LINE RIGHT  08/06/2016  . LAMINECTOMY    . LIGATION OF ARTERIOVENOUS  FISTULA Left 06/24/2016   Procedure: LIGATION OF left arm  ARTERIOVENOUS  FISTULA;  Surgeon: Rosetta Posner, MD;  Location: [place];  Service: Vascular;  Laterality: Left;  . PACEMAKER GENERATOR CHANGE  03/03/11   MDT Adaptal L implanted by Dr Rayann Heman  . PACEMAKER PLACEMENT  2008   Medtronic - Dr. Doreatha Lew  . PERIPHERAL VASCULAR CATHETERIZATION Left 03/06/2015   Procedure: Fistulagram;  Surgeon: Serafina Mitchell, MD;  Location: [place] CV LAB;  Service: Cardiovascular;  Laterality: Left;  . PERMANENT PACEMAKER GENERATOR CHANGE N/A 03/03/2011   Procedure: PERMANENT PACEMAKER GENERATOR CHANGE;  Surgeon: Thompson Grayer, MD;  [facility] CATH LAB;  Service: Cardiovascular;  Laterality: N/A;  . Right rotator cuff repair    .  THROMBECTOMY AND REVISION OF ARTERIOVENTOUS (AV) GORETEX  GRAFT Left 03/11/2015   Procedure: THROMBECTOMY AND REVISION OF ARTERIOVENTOUS (AV) GORETEX  GRAFT LEFT ARM;  Surgeon: Rosetta Posner, MD;  Location: [place];  Service: Vascular;  Laterality: Left;  . TONSILLECTOMY      Current Outpatient Medications  Medication Sig Dispense Refill  . amiodarone (PACERONE) 200 MG tablet Take 200 mg by mouth daily.    Marland Kitchen aspirin EC 81 MG tablet Take 81 mg by mouth at bedtime.    Marland Kitchen atenolol (TENORMIN) 25 MG tablet Take 12.5 mg by mouth daily.      Lorin Picket 1 GM 210 MG(Fe) tablet Place 1 tablet inside cheek 2 (two) times daily.    Marland Kitchen docusate sodium (COLACE) 100 MG capsule Take 100 mg by mouth daily.     . folic acid (FOLVITE) 1 MG tablet Take 1 mg by mouth daily.      Marland Kitchen gabapentin (NEURONTIN) 400 MG capsule Take 400 mg by mouth at bedtime.     Marland Kitchen HYDROcodone-acetaminophen (NORCO) 10-325 MG tablet Take 1 tablet by mouth every 6 (six) hours as needed for severe pain. (Patient taking differently: Take 1 tablet by mouth 3 (three) times daily. ) 15 tablet 0  . levothyroxine (SYNTHROID, LEVOTHROID) 125 MCG tablet Take 125 mcg by mouth daily at 2 PM. In the afternoon.    . Melatonin 5 MG TABS Take 5 mg by mouth at bedtime.    . midodrine (PROAMATINE) 10 MG tablet Take 10 mg by mouth 2 (two) times daily.     . naproxen sodium (ALEVE) 220 MG tablet Take 440 mg by mouth daily as needed (for pain.).    Marland Kitchen nitroGLYCERIN (NITROSTAT) 0.4 MG SL tablet Place 1 tablet (0.4 mg total) under the tongue every 5 (five) minutes as needed. (Patient taking differently: Place 0.4 mg under the tongue every 5 (five) minutes as needed for chest pain. ) 25 tablet 2  . omeprazole (PRILOSEC) 20 MG capsule Take 20 mg by mouth at bedtime.     . sevelamer carbonate (RENVELA) 800 MG tablet Take 800-1,600 mg by mouth. Take 800 mg-1600 mg with each meal and 856m with each snack    . tamsulosin (FLOMAX) 0.4 MG CAPS capsule Take 0.4 mg by mouth at bedtime.     No current facility-administered medications for this visit.    Allergies:  Oxycodone hcl   Social History: The patient  reports that he quit smoking about 57 years ago. His smoking use included cigarettes. He started smoking about 69 years ago. He has a 9.60 pack-year smoking history. He quit smokeless tobacco use about 37 years ago. His smokeless tobacco use included chew. He reports that he drinks alcohol. He reports that he does not use drugs.   Family History: The patient's family history includes Coronary artery  disease in his father, mother, and sister; Heart disease in his father, mother, and sister.   ROS:  Please see the history of present illness. Otherwise, complete review of systems is positive for {NONE DEFAULTED:18576::"none"}.  All other systems are reviewed and negative.   Physical Exam: VS:  There were no vitals taken for this visit., BMI There is no height or weight on file to calculate BMI.  Wt Readings from Last 3 Encounters:  04/23/17 176 lb 6.4 oz (80 kg)  03/22/17 174 lb 9.6 oz (79.2 kg)  01/06/17 170 lb (77.1 kg)    General: Patient appears comfortable at rest. HEENT: Conjunctiva and lids normal,  oropharynx clear with moist mucosa. Neck: Supple, no elevated JVP or carotid bruits, no thyromegaly. Lungs: Clear to auscultation, nonlabored breathing at rest. Cardiac: Regular rate and rhythm, no S3 or significant systolic murmur, no pericardial rub. Abdomen: Soft, nontender, no hepatomegaly, bowel sounds present, no guarding or rebound. Extremities: No pitting edema, distal pulses 2+. Skin: Warm and dry. Musculoskeletal: No kyphosis. Neuropsychiatric: Alert and oriented x3, affect grossly appropriate.  ECG: I personally reviewed the tracing from 03/22/2017 which showed an atrial paced rhythm with left bundle branch block.  Recent Labwork: 01/06/2017: BUN 31; Creatinine, Ser 4.30; Hemoglobin 9.9; Potassium 4.6; Sodium 135  May 2019: Potassium 5.0, BUN 50, creatinine 6.27, hemoglobin 11.5, platelets 175, proBNP 84,300, cholesterol 132, triglycerides 55, HDL 52, LDL 69, hemoglobin A1c 5.4  Other Studies Reviewed Today:  Echocardiogram 06/06/2017 [facility]): LVEF 25 to 30%, MAC with moderate mitral regurgitation, moderate left atrial enlargement, right ventricular dilatation with decreased contraction, mild to moderate tricuspid regurgitation, mild right atrial enlargement.  Assessment and Plan:    Current medicines were reviewed with the patient today.  No  orders of the defined types were placed in this encounter.   Disposition:  Signed, Satira Sark, MD, [facility] 06/07/2017 2:45 PM    [facility] [place], [place], [place] [address] Phone: [phone]; Fax: [phone]

## 2017-06-08 ENCOUNTER — Ambulatory Visit: Payer: Medicare Other | Admitting: Cardiology

## 2017-06-08 DIAGNOSIS — D631 Anemia in chronic kidney disease: Secondary | ICD-10-CM | POA: Diagnosis not present

## 2017-06-08 DIAGNOSIS — N186 End stage renal disease: Secondary | ICD-10-CM | POA: Diagnosis not present

## 2017-06-08 DIAGNOSIS — D509 Iron deficiency anemia, unspecified: Secondary | ICD-10-CM | POA: Diagnosis not present

## 2017-06-08 DIAGNOSIS — Z992 Dependence on renal dialysis: Secondary | ICD-10-CM | POA: Diagnosis not present

## 2017-06-10 DIAGNOSIS — N186 End stage renal disease: Secondary | ICD-10-CM | POA: Diagnosis not present

## 2017-06-10 DIAGNOSIS — Z992 Dependence on renal dialysis: Secondary | ICD-10-CM | POA: Diagnosis not present

## 2017-06-10 DIAGNOSIS — D631 Anemia in chronic kidney disease: Secondary | ICD-10-CM | POA: Diagnosis not present

## 2017-06-10 DIAGNOSIS — D509 Iron deficiency anemia, unspecified: Secondary | ICD-10-CM | POA: Diagnosis not present

## 2017-06-10 NOTE — Progress Notes (Signed)
Cardiology Office Note  Date: 06/11/2017   ID: Ki, Corbo [birth year], MRN [id number]  PCP: Monico Blitz, MD  Primary Cardiologist: Rozann Lesches, MD   Chief Complaint  Patient presents with  . Cardiomyopathy  . PAF    History of Present Illness: Dale Torres is a 84 y.o. male last seen in March.  He presents today for a post hospital follow-up with his daughter. Records indicate recent ER visit at [facility] on May 18.  He was transferred to [facility].  He was evaluated for chest pain in the setting of volume overload.  He had transient mild elevation in troponin level, underwent hemodialysis for volume stabilization, was also reportedly seen by cardiology.  Echocardiogram from May 19 reported LVEF 25 to 30% representing a decrease compared to previous evaluation.  No additional inpatient cardiac evaluation was pursued.  I do not have a discharge summary.  He comes in today stating that he has had no further chest pain.  He describes having a pleuritic discomfort in the central portion of his chest about the size of a fist, this began earlier in the evening of his presentation and became more intense at nighttime without precipitant.  He does not recall any specific therapy that made the symptoms resolve, indicates that they went away on their own.  Nitroglycerin was not helpful at home.  He does not report any palpitations recently.  No cough or hemoptysis.  Reports regular hemodialysis sessions.  I reviewed his medications.  Cardiac regimen is minimal due to propensity to hypotension on hemodialysis, in fact he is on Proamatine.  He follows in the device clinic with Dr. Rayann Heman, [employer]tronic pacemaker in place.  Device interrogation in April indicated 1.8% AF burden.  He is due for a follow-up device interrogation in early June.  Past Medical History:  Diagnosis Date  . Anemia    Dr. Sonny Dandy  . Arthritis   . Benign prostatic hypertrophy   . Carpal  tunnel syndrome of left wrist   . Coronary atherosclerosis of native coronary artery    Nonobstructive 2009  . ESRD on hemodialysis ([place])   . Essential hypertension, benign   . GERD (gastroesophageal reflux disease)   . Headache   . History of pneumonia   . HOH (hard of hearing)    Bilateral  hearing aids  . Hypothyroidism   . Persistent atrial fibrillation (HCC)    Declines coumadin  . Presence of permanent cardiac pacemaker   . Sick sinus syndrome [facility])    Status post pacemaker placement 2008  . Skin cancer, basal cell     Past Surgical History:  Procedure Laterality Date  . A/V FISTULAGRAM N/A 01/06/2017   Procedure: A/V FISTULAGRAM;  Surgeon: Waynetta Sandy, MD;  Location: [place] CV LAB;  Service: Cardiovascular;  Laterality: N/A;  . APPENDECTOMY    . AV FISTULA PLACEMENT Left 08/16/2014   Procedure: Left Arm creation of Brachiocephalic ARTERIOVENOUS (AV) FISTULA ;  Surgeon: Angelia Mould, MD;  Location: [place];  Service: Vascular;  Laterality: Left;  . AV FISTULA PLACEMENT Right 06/24/2016   Procedure: CREATION OF RIGHT RADIOCEPHALIC ARTERIOVENOUS (AV);  Surgeon: Rosetta Posner, MD;  Location: [place];  Service: Vascular;  Laterality: Right;  . CHOLECYSTECTOMY    . COLONOSCOPY W/ BIOPSIES AND POLYPECTOMY    . EYE SURGERY    . INSERTION OF DIALYSIS CATHETER Left 06/24/2016   Procedure: INSERTION OF DIALYSIS CATHETER - left Internal  Jugular placement;  Surgeon: Rosetta Posner, MD;  Location: [facility] OR;  Service: Vascular;  Laterality: Left;  . IR FLUORO GUIDE CV LINE RIGHT  08/06/2016  . LAMINECTOMY    . LIGATION OF ARTERIOVENOUS  FISTULA Left 06/24/2016   Procedure: LIGATION OF left arm  ARTERIOVENOUS  FISTULA;  Surgeon: Rosetta Posner, MD;  Location: [place];  Service: Vascular;  Laterality: Left;  . PACEMAKER GENERATOR CHANGE  03/03/11   MDT Adaptal L implanted by Dr Rayann Heman  . PACEMAKER PLACEMENT  2008   [employer]tr[employer] - Dr. Doreatha Lew  . PERIPHERAL VASCULAR CATHETERIZATION  Left 03/06/2015   Procedure: Fistulagram;  Surgeon: Serafina Mitchell, MD;  Location: [place] CV LAB;  Service: Cardiovascular;  Laterality: Left;  . PERMANENT PACEMAKER GENERATOR CHANGE N/A 03/03/2011   Procedure: PERMANENT PACEMAKER GENERATOR CHANGE;  Surgeon: Thompson Grayer, MD;  Location: [facility] CATH LAB;  Service: Cardiovascular;  Laterality: N/A;  . Right rotator cuff repair    . THROMBECTOMY AND REVISION OF ARTERIOVENTOUS (AV) GORETEX  GRAFT Left 03/11/2015   Procedure: THROMBECTOMY AND REVISION OF ARTERIOVENTOUS (AV) GORETEX  GRAFT LEFT ARM;  Surgeon: Rosetta Posner, MD;  Location: [place];  Service: Vascular;  Laterality: Left;  . TONSILLECTOMY      Current Outpatient Medications  Medication Sig Dispense Refill  . amiodarone (PACERONE) 200 MG tablet Take 200 mg by mouth daily.    Marland Kitchen aspirin EC 81 MG tablet Take 81 mg by mouth at bedtime.    Lorin Picket 1 GM 210 MG(Fe) tablet Place 1 tablet inside cheek 2 (two) times daily.    Marland Kitchen docusate sodium (COLACE) 100 MG capsule Take 100 mg by mouth daily.     . folic acid (FOLVITE) 1 MG tablet Take 1 mg by mouth daily.      Marland Kitchen gabapentin (NEURONTIN) 400 MG capsule Take 400 mg by mouth at bedtime.     Marland Kitchen HYDROcodone-acetaminophen (NORCO) 10-325 MG tablet Take 1 tablet by mouth every 6 (six) hours as needed for severe pain. (Patient taking differently: Take 1 tablet by mouth 3 (three) times daily. ) 15 tablet 0  . levothyroxine (SYNTHROID, LEVOTHROID) 125 MCG tablet Take 125 mcg by mouth daily at 2 PM. In the afternoon.    . Melatonin 5 MG TABS Take 5 mg by mouth at bedtime.    . midodrine (PROAMATINE) 10 MG tablet Take 10 mg by mouth 2 (two) times daily.     . naproxen sodium (ALEVE) 220 MG tablet Take 440 mg by mouth daily as needed (for pain.).    Marland Kitchen nitroGLYCERIN (NITROSTAT) 0.4 MG SL tablet Place 1 tablet (0.4 mg total) under the tongue every 5 (five) minutes as needed for chest pain (up to 3 doses. If no relief after 3rd dose, proceed to the ED for an  evaluation or call 911). 25 tablet 3  . omeprazole (PRILOSEC) 20 MG capsule Take 20 mg by mouth at bedtime.     . tamsulosin (FLOMAX) 0.4 MG CAPS capsule Take 0.4 mg by mouth at bedtime.    . carvedilol (COREG) 3.125 MG tablet Take 1 tablet (3.125 mg total) by mouth 2 (two) times daily. Except, do not take the morning dose on your dialysis days 180 tablet 3   No current facility-administered medications for this visit.    Allergies:  Oxycodone hcl   Social History: The patient  reports that he quit smoking about 57 years ago. His smoking use included cigarettes. He started smoking about 69 years  ago. He has a 9.60 pack-year smoking history. He quit smokeless tobacco use about 37 years ago. His smokeless tobacco use included chew. He reports that he drinks alcohol. He reports that he does not use drugs.   ROS:  Please see the history of present illness. Otherwise, complete review of systems is positive for hearing loss.  All other systems are reviewed and negative.   Physical Exam: VS:  BP 104/62   Pulse 72   Ht _0  (1.778 m)   Wt 170 lb 12.8 oz (77.5 kg)   SpO2 97% Comment: on room air  BMI 24.51 kg/m , BMI Body mass index is 24.51 kg/m.  Wt Readings from Last 3 Encounters:  06/11/17 170 lb 12.8 oz (77.5 kg)  04/23/17 176 lb 6.4 oz (80 kg)  03/22/17 174 lb 9.6 oz (79.2 kg)    General: Elderly male, appears comfortable at rest.  Uses rolling walker. HEENT: Conjunctiva and lids normal, oropharynx clear. Neck: Supple, no elevated JVP or carotid bruits, no thyromegaly. Lungs: Clear to auscultation, nonlabored breathing at rest. Cardiac: Regular rate and rhythm, no S3, soft systolic murmur, no pericardial rub. Abdomen: Soft, nontender, bowel sounds present. Extremities: Mild ankle edema, distal pulses 1+.  AV fistula left arm. Skin: Warm and dry. Musculoskeletal: No kyphosis. Neuropsychiatric: Alert and oriented x3, affect grossly appropriate.  ECG: I personally reviewed the  tracing from 03/22/2017 which showed an atrial paced rhythm with left bundle branch block.  Recent Labwork: 01/06/2017: BUN 31; Creatinine, Ser 4.30; Hemoglobin 9.9; Potassium 4.6; Sodium 135  May 2019: Potassium 5.0, BUN 50, creatinine 6.27, hemoglobin 11.5, platelets 175, proBNP 84,300, cholesterol 132, triglycerides 55, HDL 52, LDL 69, hemoglobin A1c 5.4  Other Studies Reviewed Today:  Echocardiogram 06/06/2017 [facility]): LVEF 25 to 30%, MAC with moderate mitral regurgitation, moderate left atrial enlargement, right ventricular dilatation with decreased contraction, mild to moderate tricuspid regurgitation, mild right atrial enlargement.  Assessment and Plan:  1.  Recent episode of pleuritic chest pain, hospitalized at [facility].  I reviewed the available records.  Reportedly had transient elevation in troponin levels not clearly diagnostic for ACS.  LVEF was found to be 25 to 30% representing a decrease compared to previous assessment, also decreased right ventricular contraction.  He has a history of nonobstructive coronary atherosclerosis by cardiac catheterization in 2009.  Whether this decrease in LVEF represents an acute or more subacute process is not clear.  Baseline blood pressure limits medications significantly, complicated by need for hemodialysis and further propensity to hypotension requiring Proamatine.  At this point we will change from atenolol to Coreg beginning at 3.25 mg twice daily.  Office follow-up arranged.  2.  Paroxysmal atrial fibrillation, this is been well controlled based on device interrogation.  He continues on amiodarone.  He has declined anticoagulation and is on aspirin daily.  3.  End-stage renal disease on hemodialysis.   Current medicines were reviewed with the patient today.  Disposition: Follow-up in 4 to 6 weeks.  Signed, Satira Sark, MD, [facility] 06/11/2017 1:31 PM    [facility] [address] [place],  [address] Phone: [phone]; Fax: [phone]

## 2017-06-11 ENCOUNTER — Encounter: Payer: Self-pay | Admitting: Cardiology

## 2017-06-11 ENCOUNTER — Ambulatory Visit (INDEPENDENT_AMBULATORY_CARE_PROVIDER_SITE_OTHER): Payer: Medicare Other | Admitting: Cardiology

## 2017-06-11 VITALS — BP 104/62 | HR 72 | Ht 70.0 in | Wt 170.8 lb

## 2017-06-11 DIAGNOSIS — Z992 Dependence on renal dialysis: Secondary | ICD-10-CM | POA: Diagnosis not present

## 2017-06-11 DIAGNOSIS — I2510 Atherosclerotic heart disease of native coronary artery without angina pectoris: Secondary | ICD-10-CM | POA: Diagnosis not present

## 2017-06-11 DIAGNOSIS — I480 Paroxysmal atrial fibrillation: Secondary | ICD-10-CM | POA: Diagnosis not present

## 2017-06-11 DIAGNOSIS — Z87898 Personal history of other specified conditions: Secondary | ICD-10-CM | POA: Diagnosis not present

## 2017-06-11 DIAGNOSIS — N186 End stage renal disease: Secondary | ICD-10-CM

## 2017-06-11 DIAGNOSIS — I429 Cardiomyopathy, unspecified: Secondary | ICD-10-CM | POA: Diagnosis not present

## 2017-06-11 MED ORDER — CARVEDILOL 3.125 MG PO TABS: 3.1250 mg | ORAL_TABLET | Freq: Two times a day (BID) | ORAL | 3 refills | Status: DC

## 2017-06-11 MED ORDER — NITROGLYCERIN 0.4 MG SL SUBL: 0.4000 mg | SUBLINGUAL_TABLET | SUBLINGUAL | 3 refills | Status: AC | PRN

## 2017-06-11 NOTE — Patient Instructions (Addendum)
Medication Instructions:   Your physician has recommended you make the following change in your medication:   Stop atenolol.  Start carvedilol 3.125 mg by mouth twice daily. On dialysis days, please skip your morning dose of this med.  Continue all other medications the same.  Refill sent for nitroglycerin.  Labwork:  NONE  Testing/Procedures:  NONE  Follow-Up:  Your physician recommends that you schedule a follow-up appointment in: 4-6 weeks.  Any Other Special Instructions Will Be Listed Below (If Applicable).  If you need a refill on your cardiac medications before your next appointment, please call your pharmacy.

## 2017-06-12 DIAGNOSIS — D631 Anemia in chronic kidney disease: Secondary | ICD-10-CM | POA: Diagnosis not present

## 2017-06-12 DIAGNOSIS — Z992 Dependence on renal dialysis: Secondary | ICD-10-CM | POA: Diagnosis not present

## 2017-06-12 DIAGNOSIS — D509 Iron deficiency anemia, unspecified: Secondary | ICD-10-CM | POA: Diagnosis not present

## 2017-06-12 DIAGNOSIS — N186 End stage renal disease: Secondary | ICD-10-CM | POA: Diagnosis not present

## 2017-06-15 DIAGNOSIS — Z992 Dependence on renal dialysis: Secondary | ICD-10-CM | POA: Diagnosis not present

## 2017-06-15 DIAGNOSIS — D509 Iron deficiency anemia, unspecified: Secondary | ICD-10-CM | POA: Diagnosis not present

## 2017-06-15 DIAGNOSIS — D631 Anemia in chronic kidney disease: Secondary | ICD-10-CM | POA: Diagnosis not present

## 2017-06-15 DIAGNOSIS — N186 End stage renal disease: Secondary | ICD-10-CM | POA: Diagnosis not present

## 2017-06-17 DIAGNOSIS — N186 End stage renal disease: Secondary | ICD-10-CM | POA: Diagnosis not present

## 2017-06-17 DIAGNOSIS — D509 Iron deficiency anemia, unspecified: Secondary | ICD-10-CM | POA: Diagnosis not present

## 2017-06-17 DIAGNOSIS — D631 Anemia in chronic kidney disease: Secondary | ICD-10-CM | POA: Diagnosis not present

## 2017-06-17 DIAGNOSIS — Z992 Dependence on renal dialysis: Secondary | ICD-10-CM | POA: Diagnosis not present

## 2017-06-18 DIAGNOSIS — Z992 Dependence on renal dialysis: Secondary | ICD-10-CM | POA: Diagnosis not present

## 2017-06-18 DIAGNOSIS — N186 End stage renal disease: Secondary | ICD-10-CM | POA: Diagnosis not present

## 2017-06-19 DIAGNOSIS — D509 Iron deficiency anemia, unspecified: Secondary | ICD-10-CM | POA: Diagnosis not present

## 2017-06-19 DIAGNOSIS — N186 End stage renal disease: Secondary | ICD-10-CM | POA: Diagnosis not present

## 2017-06-19 DIAGNOSIS — Z992 Dependence on renal dialysis: Secondary | ICD-10-CM | POA: Diagnosis not present

## 2017-06-19 DIAGNOSIS — D631 Anemia in chronic kidney disease: Secondary | ICD-10-CM | POA: Diagnosis not present

## 2017-06-19 DIAGNOSIS — N2581 Secondary hyperparathyroidism of renal origin: Secondary | ICD-10-CM | POA: Diagnosis not present

## 2017-06-22 ENCOUNTER — Telehealth: Payer: Self-pay | Admitting: Cardiology

## 2017-06-22 ENCOUNTER — Ambulatory Visit (INDEPENDENT_AMBULATORY_CARE_PROVIDER_SITE_OTHER): Payer: Medicare Other | Admitting: *Deleted

## 2017-06-22 DIAGNOSIS — N186 End stage renal disease: Secondary | ICD-10-CM | POA: Diagnosis not present

## 2017-06-22 DIAGNOSIS — I495 Sick sinus syndrome: Secondary | ICD-10-CM

## 2017-06-22 DIAGNOSIS — D509 Iron deficiency anemia, unspecified: Secondary | ICD-10-CM | POA: Diagnosis not present

## 2017-06-22 DIAGNOSIS — D631 Anemia in chronic kidney disease: Secondary | ICD-10-CM | POA: Diagnosis not present

## 2017-06-22 DIAGNOSIS — Z992 Dependence on renal dialysis: Secondary | ICD-10-CM | POA: Diagnosis not present

## 2017-06-22 DIAGNOSIS — N2581 Secondary hyperparathyroidism of renal origin: Secondary | ICD-10-CM | POA: Diagnosis not present

## 2017-06-22 NOTE — Telephone Encounter (Signed)
LMOVM reminding pt to send remote transmission.   

## 2017-06-23 ENCOUNTER — Encounter: Payer: Self-pay | Admitting: Cardiology

## 2017-06-23 NOTE — Progress Notes (Signed)
Remote pacemaker transmission.   

## 2017-06-24 DIAGNOSIS — D631 Anemia in chronic kidney disease: Secondary | ICD-10-CM | POA: Diagnosis not present

## 2017-06-24 DIAGNOSIS — D509 Iron deficiency anemia, unspecified: Secondary | ICD-10-CM | POA: Diagnosis not present

## 2017-06-24 DIAGNOSIS — N2581 Secondary hyperparathyroidism of renal origin: Secondary | ICD-10-CM | POA: Diagnosis not present

## 2017-06-24 DIAGNOSIS — N186 End stage renal disease: Secondary | ICD-10-CM | POA: Diagnosis not present

## 2017-06-24 DIAGNOSIS — Z992 Dependence on renal dialysis: Secondary | ICD-10-CM | POA: Diagnosis not present

## 2017-06-26 DIAGNOSIS — D509 Iron deficiency anemia, unspecified: Secondary | ICD-10-CM | POA: Diagnosis not present

## 2017-06-26 DIAGNOSIS — Z992 Dependence on renal dialysis: Secondary | ICD-10-CM | POA: Diagnosis not present

## 2017-06-26 DIAGNOSIS — N2581 Secondary hyperparathyroidism of renal origin: Secondary | ICD-10-CM | POA: Diagnosis not present

## 2017-06-26 DIAGNOSIS — D631 Anemia in chronic kidney disease: Secondary | ICD-10-CM | POA: Diagnosis not present

## 2017-06-26 DIAGNOSIS — N186 End stage renal disease: Secondary | ICD-10-CM | POA: Diagnosis not present

## 2017-06-29 DIAGNOSIS — N186 End stage renal disease: Secondary | ICD-10-CM | POA: Diagnosis not present

## 2017-06-29 DIAGNOSIS — Z992 Dependence on renal dialysis: Secondary | ICD-10-CM | POA: Diagnosis not present

## 2017-06-29 DIAGNOSIS — N2581 Secondary hyperparathyroidism of renal origin: Secondary | ICD-10-CM | POA: Diagnosis not present

## 2017-06-29 DIAGNOSIS — D509 Iron deficiency anemia, unspecified: Secondary | ICD-10-CM | POA: Diagnosis not present

## 2017-06-29 DIAGNOSIS — D631 Anemia in chronic kidney disease: Secondary | ICD-10-CM | POA: Diagnosis not present

## 2017-07-01 DIAGNOSIS — D509 Iron deficiency anemia, unspecified: Secondary | ICD-10-CM | POA: Diagnosis not present

## 2017-07-01 DIAGNOSIS — D631 Anemia in chronic kidney disease: Secondary | ICD-10-CM | POA: Diagnosis not present

## 2017-07-01 DIAGNOSIS — N186 End stage renal disease: Secondary | ICD-10-CM | POA: Diagnosis not present

## 2017-07-01 DIAGNOSIS — N2581 Secondary hyperparathyroidism of renal origin: Secondary | ICD-10-CM | POA: Diagnosis not present

## 2017-07-01 DIAGNOSIS — Z992 Dependence on renal dialysis: Secondary | ICD-10-CM | POA: Diagnosis not present

## 2017-07-03 DIAGNOSIS — Z992 Dependence on renal dialysis: Secondary | ICD-10-CM | POA: Diagnosis not present

## 2017-07-03 DIAGNOSIS — N186 End stage renal disease: Secondary | ICD-10-CM | POA: Diagnosis not present

## 2017-07-03 DIAGNOSIS — N2581 Secondary hyperparathyroidism of renal origin: Secondary | ICD-10-CM | POA: Diagnosis not present

## 2017-07-03 DIAGNOSIS — D509 Iron deficiency anemia, unspecified: Secondary | ICD-10-CM | POA: Diagnosis not present

## 2017-07-03 DIAGNOSIS — D631 Anemia in chronic kidney disease: Secondary | ICD-10-CM | POA: Diagnosis not present

## 2017-07-06 DIAGNOSIS — N186 End stage renal disease: Secondary | ICD-10-CM | POA: Diagnosis not present

## 2017-07-06 DIAGNOSIS — Z992 Dependence on renal dialysis: Secondary | ICD-10-CM | POA: Diagnosis not present

## 2017-07-06 DIAGNOSIS — N2581 Secondary hyperparathyroidism of renal origin: Secondary | ICD-10-CM | POA: Diagnosis not present

## 2017-07-06 DIAGNOSIS — D509 Iron deficiency anemia, unspecified: Secondary | ICD-10-CM | POA: Diagnosis not present

## 2017-07-06 DIAGNOSIS — D631 Anemia in chronic kidney disease: Secondary | ICD-10-CM | POA: Diagnosis not present

## 2017-07-07 DIAGNOSIS — Z79899 Other long term (current) drug therapy: Secondary | ICD-10-CM | POA: Diagnosis not present

## 2017-07-07 DIAGNOSIS — M25569 Pain in unspecified knee: Secondary | ICD-10-CM | POA: Diagnosis not present

## 2017-07-07 DIAGNOSIS — Z299 Encounter for prophylactic measures, unspecified: Secondary | ICD-10-CM | POA: Diagnosis not present

## 2017-07-07 DIAGNOSIS — Z6827 Body mass index (BMI) 27.0-27.9, adult: Secondary | ICD-10-CM | POA: Diagnosis not present

## 2017-07-07 DIAGNOSIS — I10 Essential (primary) hypertension: Secondary | ICD-10-CM | POA: Diagnosis not present

## 2017-07-07 DIAGNOSIS — M549 Dorsalgia, unspecified: Secondary | ICD-10-CM | POA: Diagnosis not present

## 2017-07-08 DIAGNOSIS — D631 Anemia in chronic kidney disease: Secondary | ICD-10-CM | POA: Diagnosis not present

## 2017-07-08 DIAGNOSIS — N186 End stage renal disease: Secondary | ICD-10-CM | POA: Diagnosis not present

## 2017-07-08 DIAGNOSIS — D509 Iron deficiency anemia, unspecified: Secondary | ICD-10-CM | POA: Diagnosis not present

## 2017-07-08 DIAGNOSIS — Z992 Dependence on renal dialysis: Secondary | ICD-10-CM | POA: Diagnosis not present

## 2017-07-08 DIAGNOSIS — N2581 Secondary hyperparathyroidism of renal origin: Secondary | ICD-10-CM | POA: Diagnosis not present

## 2017-07-10 DIAGNOSIS — N186 End stage renal disease: Secondary | ICD-10-CM | POA: Diagnosis not present

## 2017-07-10 DIAGNOSIS — N2581 Secondary hyperparathyroidism of renal origin: Secondary | ICD-10-CM | POA: Diagnosis not present

## 2017-07-10 DIAGNOSIS — Z992 Dependence on renal dialysis: Secondary | ICD-10-CM | POA: Diagnosis not present

## 2017-07-10 DIAGNOSIS — D631 Anemia in chronic kidney disease: Secondary | ICD-10-CM | POA: Diagnosis not present

## 2017-07-10 DIAGNOSIS — D509 Iron deficiency anemia, unspecified: Secondary | ICD-10-CM | POA: Diagnosis not present

## 2017-07-13 DIAGNOSIS — D631 Anemia in chronic kidney disease: Secondary | ICD-10-CM | POA: Diagnosis not present

## 2017-07-13 DIAGNOSIS — Z992 Dependence on renal dialysis: Secondary | ICD-10-CM | POA: Diagnosis not present

## 2017-07-13 DIAGNOSIS — N186 End stage renal disease: Secondary | ICD-10-CM | POA: Diagnosis not present

## 2017-07-13 DIAGNOSIS — N2581 Secondary hyperparathyroidism of renal origin: Secondary | ICD-10-CM | POA: Diagnosis not present

## 2017-07-13 DIAGNOSIS — D509 Iron deficiency anemia, unspecified: Secondary | ICD-10-CM | POA: Diagnosis not present

## 2017-07-14 ENCOUNTER — Other Ambulatory Visit: Payer: Self-pay | Admitting: *Deleted

## 2017-07-14 MED ORDER — CARVEDILOL 3.125 MG PO TABS: 3.1250 mg | ORAL_TABLET | Freq: Two times a day (BID) | ORAL | 1 refills | Status: DC

## 2017-07-15 DIAGNOSIS — N2581 Secondary hyperparathyroidism of renal origin: Secondary | ICD-10-CM | POA: Diagnosis not present

## 2017-07-15 DIAGNOSIS — Z992 Dependence on renal dialysis: Secondary | ICD-10-CM | POA: Diagnosis not present

## 2017-07-15 DIAGNOSIS — N186 End stage renal disease: Secondary | ICD-10-CM | POA: Diagnosis not present

## 2017-07-15 DIAGNOSIS — D631 Anemia in chronic kidney disease: Secondary | ICD-10-CM | POA: Diagnosis not present

## 2017-07-15 DIAGNOSIS — D509 Iron deficiency anemia, unspecified: Secondary | ICD-10-CM | POA: Diagnosis not present

## 2017-07-17 DIAGNOSIS — D509 Iron deficiency anemia, unspecified: Secondary | ICD-10-CM | POA: Diagnosis not present

## 2017-07-17 DIAGNOSIS — D631 Anemia in chronic kidney disease: Secondary | ICD-10-CM | POA: Diagnosis not present

## 2017-07-17 DIAGNOSIS — Z992 Dependence on renal dialysis: Secondary | ICD-10-CM | POA: Diagnosis not present

## 2017-07-17 DIAGNOSIS — N186 End stage renal disease: Secondary | ICD-10-CM | POA: Diagnosis not present

## 2017-07-17 DIAGNOSIS — N2581 Secondary hyperparathyroidism of renal origin: Secondary | ICD-10-CM | POA: Diagnosis not present

## 2017-07-18 DIAGNOSIS — N186 End stage renal disease: Secondary | ICD-10-CM | POA: Diagnosis not present

## 2017-07-18 DIAGNOSIS — Z992 Dependence on renal dialysis: Secondary | ICD-10-CM | POA: Diagnosis not present

## 2017-07-20 DIAGNOSIS — D509 Iron deficiency anemia, unspecified: Secondary | ICD-10-CM | POA: Diagnosis not present

## 2017-07-20 DIAGNOSIS — N186 End stage renal disease: Secondary | ICD-10-CM | POA: Diagnosis not present

## 2017-07-20 DIAGNOSIS — D631 Anemia in chronic kidney disease: Secondary | ICD-10-CM | POA: Diagnosis not present

## 2017-07-20 DIAGNOSIS — Z992 Dependence on renal dialysis: Secondary | ICD-10-CM | POA: Diagnosis not present

## 2017-07-22 DIAGNOSIS — D631 Anemia in chronic kidney disease: Secondary | ICD-10-CM | POA: Diagnosis not present

## 2017-07-22 DIAGNOSIS — Z992 Dependence on renal dialysis: Secondary | ICD-10-CM | POA: Diagnosis not present

## 2017-07-22 DIAGNOSIS — D509 Iron deficiency anemia, unspecified: Secondary | ICD-10-CM | POA: Diagnosis not present

## 2017-07-22 DIAGNOSIS — N186 End stage renal disease: Secondary | ICD-10-CM | POA: Diagnosis not present

## 2017-07-23 NOTE — Progress Notes (Signed)
Cardiology Office Note  Date: 07/26/2017   ID: Mcguire, Gasparyan 05/31/1925, MRN [id number]  PCP: Monico Blitz, MD  Primary Cardiologist: Rozann Lesches, MD   Chief Complaint  Patient presents with  . PAF    History of Present Illness: Dale Torres is an 84 y.o. male last seen in May.  He is here today with his son for a routine visit.  He does not report any angina symptoms or palpitations.  States that he has been compliant with regular hemodialysis, and that his sessions have not been truncated due to hypotension or other symptoms recently.  He has tolerated the switch we made from atenolol to Coreg.  He continues to follow with Dr. Rayann Heman in the device clinic, Medtronic pacemaker in place.  Today we reiterated plan to continue fairly conservative medical therapy for treatment of cardiomyopathy since he tends to run a low blood pressure and in fact is prone to hypotension requiring Proamatine.  Fluid removal is managed via hemodialysis out need for diuretic.  Past Medical History:  Diagnosis Date  . Anemia    Dr. Sonny Dandy  . Arthritis   . Benign prostatic hypertrophy   . Carpal tunnel syndrome of left wrist   . Coronary atherosclerosis of native coronary artery    Nonobstructive 2009  . ESRD on hemodialysis ([place])   . Essential hypertension, benign   . GERD (gastroesophageal reflux disease)   . Headache   . History of pneumonia   . HOH (hard of hearing)    Bilateral  hearing aids  . Hypothyroidism   . Persistent atrial fibrillation (HCC)    Declines coumadin  . Presence of permanent cardiac pacemaker   . Sick sinus syndrome [facility])    Status post pacemaker placement 2008  . Skin cancer, basal cell     Past Surgical History:  Procedure Laterality Date  . A/V FISTULAGRAM N/A 01/06/2017   Procedure: A/V FISTULAGRAM;  Surgeon: Waynetta Sandy, MD;  Location: [place] CV LAB;  Service: Cardiovascular;  Laterality: N/A;  . APPENDECTOMY    . AV FISTULA  PLACEMENT Left 08/16/2014   Procedure: Left Arm creation of Brachiocephalic ARTERIOVENOUS (AV) FISTULA ;  Surgeon: Angelia Mould, MD;  Location: [place];  Service: Vascular;  Laterality: Left;  . AV FISTULA PLACEMENT Right 06/24/2016   Procedure: CREATION OF RIGHT RADIOCEPHALIC ARTERIOVENOUS (AV);  Surgeon: Rosetta Posner, MD;  Location: [place];  Service: Vascular;  Laterality: Right;  . CHOLECYSTECTOMY    . COLONOSCOPY W/ BIOPSIES AND POLYPECTOMY    . EYE SURGERY    . INSERTION OF DIALYSIS CATHETER Left 06/24/2016   Procedure: INSERTION OF DIALYSIS CATHETER - left Internal Jugular placement;  Surgeon: Rosetta Posner, MD;  Location: [place];  Service: Vascular;  Laterality: Left;  . IR FLUORO GUIDE CV LINE RIGHT  08/06/2016  . LAMINECTOMY    . LIGATION OF ARTERIOVENOUS  FISTULA Left 06/24/2016   Procedure: LIGATION OF left arm  ARTERIOVENOUS  FISTULA;  Surgeon: Rosetta Posner, MD;  Location: [place];  Service: Vascular;  Laterality: Left;  . PACEMAKER GENERATOR CHANGE  03/03/11   MDT Adaptal L implanted by Dr Rayann Heman  . PACEMAKER PLACEMENT  2008   [employer] - Dr. Doreatha Lew  . PERIPHERAL VASCULAR CATHETERIZATION Left 03/06/2015   Procedure: Fistulagram;  Surgeon: Serafina Mitchell, MD;  Location: [place] CV LAB;  Service: Cardiovascular;  Laterality: Left;  . PERMANENT PACEMAKER GENERATOR CHANGE N/A 03/03/2011   Procedure: PERMANENT  PACEMAKER GENERATOR CHANGE;  Surgeon: Thompson Grayer, MD;  Location: [facility] CATH LAB;  Service: Cardiovascular;  Laterality: N/A;  . Right rotator cuff repair    . THROMBECTOMY AND REVISION OF ARTERIOVENTOUS (AV) GORETEX  GRAFT Left 03/11/2015   Procedure: THROMBECTOMY AND REVISION OF ARTERIOVENTOUS (AV) GORETEX  GRAFT LEFT ARM;  Surgeon: Rosetta Posner, MD;  Location: [place];  Service: Vascular;  Laterality: Left;  . TONSILLECTOMY      Current Outpatient Medications  Medication Sig Dispense Refill  . amiodarone (PACERONE) 200 MG tablet Take 200 mg by mouth daily.    Marland Kitchen aspirin EC 81  MG tablet Take 81 mg by mouth at bedtime.    Lorin Picket 1 GM 210 MG(Fe) tablet Place 1 tablet inside cheek 2 (two) times daily.    . carvedilol (COREG) 3.125 MG tablet Take 1 tablet (3.125 mg total) by mouth 2 (two) times daily. Except, do not take the morning dose on your dialysis days 180 tablet 1  . docusate sodium (COLACE) 100 MG capsule Take 100 mg by mouth daily.     . folic acid (FOLVITE) 1 MG tablet Take 1 mg by mouth daily.      Marland Kitchen gabapentin (NEURONTIN) 400 MG capsule Take 400 mg by mouth at bedtime.     Marland Kitchen HYDROcodone-acetaminophen (NORCO) 10-325 MG tablet Take 1 tablet by mouth every 6 (six) hours as needed for severe pain. (Patient taking differently: Take 1 tablet by mouth 3 (three) times daily. ) 15 tablet 0  . levothyroxine (SYNTHROID, LEVOTHROID) 125 MCG tablet Take 125 mcg by mouth daily at 2 PM. In the afternoon.    . Melatonin 5 MG TABS Take 5 mg by mouth at bedtime.    . midodrine (PROAMATINE) 10 MG tablet Take 10 mg by mouth 2 (two) times daily.     . naproxen sodium (ALEVE) 220 MG tablet Take 440 mg by mouth daily as needed (for pain.).    Marland Kitchen nitroGLYCERIN (NITROSTAT) 0.4 MG SL tablet Place 1 tablet (0.4 mg total) under the tongue every 5 (five) minutes as needed for chest pain (up to 3 doses. If no relief after 3rd dose, proceed to the ED for an evaluation or call 911). 25 tablet 3  . omeprazole (PRILOSEC) 20 MG capsule Take 20 mg by mouth at bedtime.     . tamsulosin (FLOMAX) 0.4 MG CAPS capsule Take 0.4 mg by mouth at bedtime.     No current facility-administered medications for this visit.    Allergies:  Oxycodone hcl   Social History: The patient  reports that he quit smoking about 57 years ago. His smoking use included cigarettes. He started smoking about 69 years ago. He has a 9.60 pack-year smoking history. He quit smokeless tobacco use about 37 years ago. His smokeless tobacco use included chew. He reports that he drinks alcohol. He reports that he does not use drugs.     ROS:  Please see the history of present illness. Otherwise, complete review of systems is positive for arthritic pains, hearing loss.  All other systems are reviewed and negative.   Physical Exam: VS:  BP 111/68   Pulse 66   Ht _0  (1.778 m)   Wt 173 lb 6.4 oz (78.7 kg)   SpO2 98%   BMI 24.88 kg/m , BMI Body mass index is 24.88 kg/m.  Wt Readings from Last 3 Encounters:  07/26/17 173 lb 6.4 oz (78.7 kg)  06/11/17 170 lb 12.8 oz (77.5 kg)  04/23/17 176 lb 6.4 oz (80 kg)    General: Elderly male, patient appears comfortable at rest.  Using rolling walker. HEENT: Conjunctiva and lids normal, oropharynx clear. Neck: Supple, no elevated JVP or carotid bruits, no thyromegaly. Lungs: Clear to auscultation, nonlabored breathing at rest. Cardiac: Regular rate and rhythm, no S3, soft systolic murmur. Abdomen: Soft, nontender, bowel sounds present. Extremities: Mild ankle edema, distal pulses 1+.  AV fistula left arm. Skin: Warm and dry. Musculoskeletal: No kyphosis. Neuropsychiatric: Alert and oriented x3, affect grossly appropriate.  ECG: I personally reviewed the tracing from 03/22/2017 which showed an atrial paced rhythm with left bundle branch block.  Recent Labwork: 01/06/2017: BUN 31; Creatinine, Ser 4.30; Hemoglobin 9.9; Potassium 4.6; Sodium 135   Other Studies Reviewed Today:  Echocardiogram 06/06/2017 [facility]): LVEF 25 to 30%, MAC with moderate mitral regurgitation, moderate left atrial enlargement, right ventricular dilatation with decreased contraction, mild to moderate tricuspid regurgitation, mild right atrial enlargement.  Assessment and Plan:  1.  Secondary cardiomyopathy with LVEF 25 to 30% by recent echocardiogram.  Plan is to manage conservatively.  He is tolerating Coreg at this time, not on diuretic with ongoing hemodialysis for fluid removal.  Due to propensity to hypotension and need for Proamatine, we will hold off on additional agents  such as ARB or hydralazine.  2.  Paroxysmal atrial fibrillation.  Well controlled in terms of symptoms.  He continues on amiodarone and has consistently declined anticoagulation.  He is on aspirin daily.  3.  End-stage renal disease on hemodialysis.  Current medicines were reviewed with the patient today.  Disposition: Follow-up in 3 months.  Signed, Satira Sark, MD, [facility] 07/26/2017 3:12 PM    [place] [facility] [place], [place], [place] [address] Phone: [phone]; Fax: [phone]

## 2017-07-24 DIAGNOSIS — D631 Anemia in chronic kidney disease: Secondary | ICD-10-CM | POA: Diagnosis not present

## 2017-07-24 DIAGNOSIS — N186 End stage renal disease: Secondary | ICD-10-CM | POA: Diagnosis not present

## 2017-07-24 DIAGNOSIS — D509 Iron deficiency anemia, unspecified: Secondary | ICD-10-CM | POA: Diagnosis not present

## 2017-07-24 DIAGNOSIS — Z992 Dependence on renal dialysis: Secondary | ICD-10-CM | POA: Diagnosis not present

## 2017-07-26 ENCOUNTER — Ambulatory Visit (INDEPENDENT_AMBULATORY_CARE_PROVIDER_SITE_OTHER): Payer: Medicare Other | Admitting: Cardiology

## 2017-07-26 ENCOUNTER — Encounter: Payer: Self-pay | Admitting: Cardiology

## 2017-07-26 VITALS — BP 111/68 | HR 66 | Ht 70.0 in | Wt 173.4 lb

## 2017-07-26 DIAGNOSIS — N186 End stage renal disease: Secondary | ICD-10-CM | POA: Diagnosis not present

## 2017-07-26 DIAGNOSIS — I2510 Atherosclerotic heart disease of native coronary artery without angina pectoris: Secondary | ICD-10-CM

## 2017-07-26 DIAGNOSIS — I480 Paroxysmal atrial fibrillation: Secondary | ICD-10-CM | POA: Diagnosis not present

## 2017-07-26 DIAGNOSIS — Z992 Dependence on renal dialysis: Secondary | ICD-10-CM | POA: Diagnosis not present

## 2017-07-26 DIAGNOSIS — I429 Cardiomyopathy, unspecified: Secondary | ICD-10-CM | POA: Diagnosis not present

## 2017-07-26 NOTE — Patient Instructions (Signed)
Medication Instructions:  Your physician recommends that you continue on your current medications as directed. Please refer to the Current Medication list given to you today.  Labwork: NONE  Testing/Procedures: NONE  Follow-Up: Your physician recommends that you schedule a follow-up appointment in: 3 MONTHS WITH DR. MCDOWELL.  Any Other Special Instructions Will Be Listed Below (If Applicable).  If you need a refill on your cardiac medications before your next appointment, please call your pharmacy. 

## 2017-07-27 DIAGNOSIS — Z992 Dependence on renal dialysis: Secondary | ICD-10-CM | POA: Diagnosis not present

## 2017-07-27 DIAGNOSIS — D509 Iron deficiency anemia, unspecified: Secondary | ICD-10-CM | POA: Diagnosis not present

## 2017-07-27 DIAGNOSIS — D631 Anemia in chronic kidney disease: Secondary | ICD-10-CM | POA: Diagnosis not present

## 2017-07-27 DIAGNOSIS — N186 End stage renal disease: Secondary | ICD-10-CM | POA: Diagnosis not present

## 2017-07-29 DIAGNOSIS — D509 Iron deficiency anemia, unspecified: Secondary | ICD-10-CM | POA: Diagnosis not present

## 2017-07-29 DIAGNOSIS — Z992 Dependence on renal dialysis: Secondary | ICD-10-CM | POA: Diagnosis not present

## 2017-07-29 DIAGNOSIS — N186 End stage renal disease: Secondary | ICD-10-CM | POA: Diagnosis not present

## 2017-07-29 DIAGNOSIS — D631 Anemia in chronic kidney disease: Secondary | ICD-10-CM | POA: Diagnosis not present

## 2017-07-29 LAB — CUP PACEART REMOTE DEVICE CHECK
Battery Impedance: 308 Ohm
Battery Remaining Longevity: 107 mo
Battery Voltage: 2.79 V
Brady Statistic AP VP Percent: 3 %
Brady Statistic AS VS Percent: 96 %
Date Time Interrogation Session: 20190604224251
Implantable Lead Implant Date: 20080604
Implantable Lead Location: 753859
Implantable Lead Model: 4469
Implantable Lead Serial Number: 498205
Implantable Pulse Generator Implant Date: 20130212
Lead Channel Impedance Value: 418 Ohm
Lead Channel Pacing Threshold Amplitude: 0.5 V
Lead Channel Pacing Threshold Amplitude: 0.625 V
Lead Channel Setting Pacing Amplitude: 2 V
Lead Channel Setting Pacing Amplitude: 2.5 V
Lead Channel Setting Pacing Pulse Width: 0.4 ms
Lead Channel Setting Sensing Sensitivity: 2.8 mV
MDC IDC LEAD IMPLANT DT: 20080604
MDC IDC LEAD LOCATION: 753860
MDC IDC LEAD SERIAL: 601713
MDC IDC MSMT LEADCHNL RA PACING THRESHOLD PULSEWIDTH: 0.4 ms
MDC IDC MSMT LEADCHNL RV IMPEDANCE VALUE: 437 Ohm
MDC IDC MSMT LEADCHNL RV PACING THRESHOLD PULSEWIDTH: 0.4 ms
MDC IDC STAT BRADY AP VS PERCENT: 1 %
MDC IDC STAT BRADY AS VP PERCENT: 1 %

## 2017-07-31 DIAGNOSIS — D509 Iron deficiency anemia, unspecified: Secondary | ICD-10-CM | POA: Diagnosis not present

## 2017-07-31 DIAGNOSIS — D631 Anemia in chronic kidney disease: Secondary | ICD-10-CM | POA: Diagnosis not present

## 2017-07-31 DIAGNOSIS — Z992 Dependence on renal dialysis: Secondary | ICD-10-CM | POA: Diagnosis not present

## 2017-07-31 DIAGNOSIS — N186 End stage renal disease: Secondary | ICD-10-CM | POA: Diagnosis not present

## 2017-08-03 DIAGNOSIS — D509 Iron deficiency anemia, unspecified: Secondary | ICD-10-CM | POA: Diagnosis not present

## 2017-08-03 DIAGNOSIS — Z992 Dependence on renal dialysis: Secondary | ICD-10-CM | POA: Diagnosis not present

## 2017-08-03 DIAGNOSIS — N186 End stage renal disease: Secondary | ICD-10-CM | POA: Diagnosis not present

## 2017-08-03 DIAGNOSIS — D631 Anemia in chronic kidney disease: Secondary | ICD-10-CM | POA: Diagnosis not present

## 2017-08-05 DIAGNOSIS — Z992 Dependence on renal dialysis: Secondary | ICD-10-CM | POA: Diagnosis not present

## 2017-08-05 DIAGNOSIS — D631 Anemia in chronic kidney disease: Secondary | ICD-10-CM | POA: Diagnosis not present

## 2017-08-05 DIAGNOSIS — D509 Iron deficiency anemia, unspecified: Secondary | ICD-10-CM | POA: Diagnosis not present

## 2017-08-05 DIAGNOSIS — N186 End stage renal disease: Secondary | ICD-10-CM | POA: Diagnosis not present

## 2017-08-07 DIAGNOSIS — N186 End stage renal disease: Secondary | ICD-10-CM | POA: Diagnosis not present

## 2017-08-07 DIAGNOSIS — D509 Iron deficiency anemia, unspecified: Secondary | ICD-10-CM | POA: Diagnosis not present

## 2017-08-07 DIAGNOSIS — Z992 Dependence on renal dialysis: Secondary | ICD-10-CM | POA: Diagnosis not present

## 2017-08-07 DIAGNOSIS — D631 Anemia in chronic kidney disease: Secondary | ICD-10-CM | POA: Diagnosis not present

## 2017-08-10 DIAGNOSIS — Z992 Dependence on renal dialysis: Secondary | ICD-10-CM | POA: Diagnosis not present

## 2017-08-10 DIAGNOSIS — D509 Iron deficiency anemia, unspecified: Secondary | ICD-10-CM | POA: Diagnosis not present

## 2017-08-10 DIAGNOSIS — N186 End stage renal disease: Secondary | ICD-10-CM | POA: Diagnosis not present

## 2017-08-10 DIAGNOSIS — D631 Anemia in chronic kidney disease: Secondary | ICD-10-CM | POA: Diagnosis not present

## 2017-08-12 DIAGNOSIS — N186 End stage renal disease: Secondary | ICD-10-CM | POA: Diagnosis not present

## 2017-08-12 DIAGNOSIS — Z992 Dependence on renal dialysis: Secondary | ICD-10-CM | POA: Diagnosis not present

## 2017-08-12 DIAGNOSIS — D631 Anemia in chronic kidney disease: Secondary | ICD-10-CM | POA: Diagnosis not present

## 2017-08-12 DIAGNOSIS — D509 Iron deficiency anemia, unspecified: Secondary | ICD-10-CM | POA: Diagnosis not present

## 2017-08-14 DIAGNOSIS — D631 Anemia in chronic kidney disease: Secondary | ICD-10-CM | POA: Diagnosis not present

## 2017-08-14 DIAGNOSIS — N186 End stage renal disease: Secondary | ICD-10-CM | POA: Diagnosis not present

## 2017-08-14 DIAGNOSIS — D509 Iron deficiency anemia, unspecified: Secondary | ICD-10-CM | POA: Diagnosis not present

## 2017-08-14 DIAGNOSIS — Z992 Dependence on renal dialysis: Secondary | ICD-10-CM | POA: Diagnosis not present

## 2017-08-17 DIAGNOSIS — N186 End stage renal disease: Secondary | ICD-10-CM | POA: Diagnosis not present

## 2017-08-17 DIAGNOSIS — D631 Anemia in chronic kidney disease: Secondary | ICD-10-CM | POA: Diagnosis not present

## 2017-08-17 DIAGNOSIS — Z992 Dependence on renal dialysis: Secondary | ICD-10-CM | POA: Diagnosis not present

## 2017-08-17 DIAGNOSIS — D509 Iron deficiency anemia, unspecified: Secondary | ICD-10-CM | POA: Diagnosis not present

## 2017-08-18 DIAGNOSIS — Z992 Dependence on renal dialysis: Secondary | ICD-10-CM | POA: Diagnosis not present

## 2017-08-18 DIAGNOSIS — N186 End stage renal disease: Secondary | ICD-10-CM | POA: Diagnosis not present

## 2017-08-19 DIAGNOSIS — N186 End stage renal disease: Secondary | ICD-10-CM | POA: Diagnosis not present

## 2017-08-19 DIAGNOSIS — D631 Anemia in chronic kidney disease: Secondary | ICD-10-CM | POA: Diagnosis not present

## 2017-08-19 DIAGNOSIS — D509 Iron deficiency anemia, unspecified: Secondary | ICD-10-CM | POA: Diagnosis not present

## 2017-08-19 DIAGNOSIS — Z992 Dependence on renal dialysis: Secondary | ICD-10-CM | POA: Diagnosis not present

## 2017-08-21 DIAGNOSIS — N186 End stage renal disease: Secondary | ICD-10-CM | POA: Diagnosis not present

## 2017-08-21 DIAGNOSIS — Z992 Dependence on renal dialysis: Secondary | ICD-10-CM | POA: Diagnosis not present

## 2017-08-21 DIAGNOSIS — D631 Anemia in chronic kidney disease: Secondary | ICD-10-CM | POA: Diagnosis not present

## 2017-08-21 DIAGNOSIS — D509 Iron deficiency anemia, unspecified: Secondary | ICD-10-CM | POA: Diagnosis not present

## 2017-08-24 DIAGNOSIS — N186 End stage renal disease: Secondary | ICD-10-CM | POA: Diagnosis not present

## 2017-08-24 DIAGNOSIS — D631 Anemia in chronic kidney disease: Secondary | ICD-10-CM | POA: Diagnosis not present

## 2017-08-24 DIAGNOSIS — D509 Iron deficiency anemia, unspecified: Secondary | ICD-10-CM | POA: Diagnosis not present

## 2017-08-24 DIAGNOSIS — Z992 Dependence on renal dialysis: Secondary | ICD-10-CM | POA: Diagnosis not present

## 2017-08-26 DIAGNOSIS — D631 Anemia in chronic kidney disease: Secondary | ICD-10-CM | POA: Diagnosis not present

## 2017-08-26 DIAGNOSIS — D509 Iron deficiency anemia, unspecified: Secondary | ICD-10-CM | POA: Diagnosis not present

## 2017-08-26 DIAGNOSIS — N186 End stage renal disease: Secondary | ICD-10-CM | POA: Diagnosis not present

## 2017-08-26 DIAGNOSIS — Z992 Dependence on renal dialysis: Secondary | ICD-10-CM | POA: Diagnosis not present

## 2017-08-28 DIAGNOSIS — N186 End stage renal disease: Secondary | ICD-10-CM | POA: Diagnosis not present

## 2017-08-28 DIAGNOSIS — Z992 Dependence on renal dialysis: Secondary | ICD-10-CM | POA: Diagnosis not present

## 2017-08-28 DIAGNOSIS — D631 Anemia in chronic kidney disease: Secondary | ICD-10-CM | POA: Diagnosis not present

## 2017-08-28 DIAGNOSIS — D509 Iron deficiency anemia, unspecified: Secondary | ICD-10-CM | POA: Diagnosis not present

## 2017-08-31 DIAGNOSIS — D509 Iron deficiency anemia, unspecified: Secondary | ICD-10-CM | POA: Diagnosis not present

## 2017-08-31 DIAGNOSIS — N186 End stage renal disease: Secondary | ICD-10-CM | POA: Diagnosis not present

## 2017-08-31 DIAGNOSIS — Z992 Dependence on renal dialysis: Secondary | ICD-10-CM | POA: Diagnosis not present

## 2017-08-31 DIAGNOSIS — D631 Anemia in chronic kidney disease: Secondary | ICD-10-CM | POA: Diagnosis not present

## 2017-09-02 DIAGNOSIS — D631 Anemia in chronic kidney disease: Secondary | ICD-10-CM | POA: Diagnosis not present

## 2017-09-02 DIAGNOSIS — N186 End stage renal disease: Secondary | ICD-10-CM | POA: Diagnosis not present

## 2017-09-02 DIAGNOSIS — Z992 Dependence on renal dialysis: Secondary | ICD-10-CM | POA: Diagnosis not present

## 2017-09-02 DIAGNOSIS — D509 Iron deficiency anemia, unspecified: Secondary | ICD-10-CM | POA: Diagnosis not present

## 2017-09-04 DIAGNOSIS — Z992 Dependence on renal dialysis: Secondary | ICD-10-CM | POA: Diagnosis not present

## 2017-09-04 DIAGNOSIS — D631 Anemia in chronic kidney disease: Secondary | ICD-10-CM | POA: Diagnosis not present

## 2017-09-04 DIAGNOSIS — D509 Iron deficiency anemia, unspecified: Secondary | ICD-10-CM | POA: Diagnosis not present

## 2017-09-04 DIAGNOSIS — N186 End stage renal disease: Secondary | ICD-10-CM | POA: Diagnosis not present

## 2017-09-07 DIAGNOSIS — Z992 Dependence on renal dialysis: Secondary | ICD-10-CM | POA: Diagnosis not present

## 2017-09-07 DIAGNOSIS — D509 Iron deficiency anemia, unspecified: Secondary | ICD-10-CM | POA: Diagnosis not present

## 2017-09-07 DIAGNOSIS — N186 End stage renal disease: Secondary | ICD-10-CM | POA: Diagnosis not present

## 2017-09-07 DIAGNOSIS — D631 Anemia in chronic kidney disease: Secondary | ICD-10-CM | POA: Diagnosis not present

## 2017-09-09 DIAGNOSIS — N186 End stage renal disease: Secondary | ICD-10-CM | POA: Diagnosis not present

## 2017-09-09 DIAGNOSIS — D631 Anemia in chronic kidney disease: Secondary | ICD-10-CM | POA: Diagnosis not present

## 2017-09-09 DIAGNOSIS — Z992 Dependence on renal dialysis: Secondary | ICD-10-CM | POA: Diagnosis not present

## 2017-09-09 DIAGNOSIS — D509 Iron deficiency anemia, unspecified: Secondary | ICD-10-CM | POA: Diagnosis not present

## 2017-09-11 DIAGNOSIS — D631 Anemia in chronic kidney disease: Secondary | ICD-10-CM | POA: Diagnosis not present

## 2017-09-11 DIAGNOSIS — Z992 Dependence on renal dialysis: Secondary | ICD-10-CM | POA: Diagnosis not present

## 2017-09-11 DIAGNOSIS — N186 End stage renal disease: Secondary | ICD-10-CM | POA: Diagnosis not present

## 2017-09-11 DIAGNOSIS — D509 Iron deficiency anemia, unspecified: Secondary | ICD-10-CM | POA: Diagnosis not present

## 2017-09-14 DIAGNOSIS — I10 Essential (primary) hypertension: Secondary | ICD-10-CM | POA: Diagnosis not present

## 2017-09-14 DIAGNOSIS — Z6826 Body mass index (BMI) 26.0-26.9, adult: Secondary | ICD-10-CM | POA: Diagnosis not present

## 2017-09-14 DIAGNOSIS — D509 Iron deficiency anemia, unspecified: Secondary | ICD-10-CM | POA: Diagnosis not present

## 2017-09-14 DIAGNOSIS — D631 Anemia in chronic kidney disease: Secondary | ICD-10-CM | POA: Diagnosis not present

## 2017-09-14 DIAGNOSIS — Z992 Dependence on renal dialysis: Secondary | ICD-10-CM | POA: Diagnosis not present

## 2017-09-14 DIAGNOSIS — M25569 Pain in unspecified knee: Secondary | ICD-10-CM | POA: Diagnosis not present

## 2017-09-14 DIAGNOSIS — I4891 Unspecified atrial fibrillation: Secondary | ICD-10-CM | POA: Diagnosis not present

## 2017-09-14 DIAGNOSIS — I770 Arteriovenous fistula, acquired: Secondary | ICD-10-CM | POA: Diagnosis not present

## 2017-09-14 DIAGNOSIS — N186 End stage renal disease: Secondary | ICD-10-CM | POA: Diagnosis not present

## 2017-09-14 DIAGNOSIS — Z299 Encounter for prophylactic measures, unspecified: Secondary | ICD-10-CM | POA: Diagnosis not present

## 2017-09-16 DIAGNOSIS — D509 Iron deficiency anemia, unspecified: Secondary | ICD-10-CM | POA: Diagnosis not present

## 2017-09-16 DIAGNOSIS — N186 End stage renal disease: Secondary | ICD-10-CM | POA: Diagnosis not present

## 2017-09-16 DIAGNOSIS — D631 Anemia in chronic kidney disease: Secondary | ICD-10-CM | POA: Diagnosis not present

## 2017-09-16 DIAGNOSIS — Z992 Dependence on renal dialysis: Secondary | ICD-10-CM | POA: Diagnosis not present

## 2017-09-18 DIAGNOSIS — N186 End stage renal disease: Secondary | ICD-10-CM | POA: Diagnosis not present

## 2017-09-18 DIAGNOSIS — D509 Iron deficiency anemia, unspecified: Secondary | ICD-10-CM | POA: Diagnosis not present

## 2017-09-18 DIAGNOSIS — Z992 Dependence on renal dialysis: Secondary | ICD-10-CM | POA: Diagnosis not present

## 2017-09-18 DIAGNOSIS — D631 Anemia in chronic kidney disease: Secondary | ICD-10-CM | POA: Diagnosis not present

## 2017-09-21 ENCOUNTER — Telehealth: Payer: Self-pay | Admitting: Cardiology

## 2017-09-21 ENCOUNTER — Ambulatory Visit (INDEPENDENT_AMBULATORY_CARE_PROVIDER_SITE_OTHER): Payer: Medicare Other | Admitting: *Deleted

## 2017-09-21 DIAGNOSIS — N186 End stage renal disease: Secondary | ICD-10-CM | POA: Diagnosis not present

## 2017-09-21 DIAGNOSIS — N2581 Secondary hyperparathyroidism of renal origin: Secondary | ICD-10-CM | POA: Diagnosis not present

## 2017-09-21 DIAGNOSIS — D631 Anemia in chronic kidney disease: Secondary | ICD-10-CM | POA: Diagnosis not present

## 2017-09-21 DIAGNOSIS — I495 Sick sinus syndrome: Secondary | ICD-10-CM

## 2017-09-21 DIAGNOSIS — Z992 Dependence on renal dialysis: Secondary | ICD-10-CM | POA: Diagnosis not present

## 2017-09-21 NOTE — Telephone Encounter (Signed)
Confirmed remote transmission w/ pt daughter.   

## 2017-09-22 NOTE — Progress Notes (Signed)
Remote pacemaker transmission.   

## 2017-09-23 DIAGNOSIS — Z992 Dependence on renal dialysis: Secondary | ICD-10-CM | POA: Diagnosis not present

## 2017-09-23 DIAGNOSIS — N2581 Secondary hyperparathyroidism of renal origin: Secondary | ICD-10-CM | POA: Diagnosis not present

## 2017-09-23 DIAGNOSIS — N186 End stage renal disease: Secondary | ICD-10-CM | POA: Diagnosis not present

## 2017-09-23 DIAGNOSIS — D631 Anemia in chronic kidney disease: Secondary | ICD-10-CM | POA: Diagnosis not present

## 2017-09-25 DIAGNOSIS — D631 Anemia in chronic kidney disease: Secondary | ICD-10-CM | POA: Diagnosis not present

## 2017-09-25 DIAGNOSIS — N2581 Secondary hyperparathyroidism of renal origin: Secondary | ICD-10-CM | POA: Diagnosis not present

## 2017-09-25 DIAGNOSIS — Z992 Dependence on renal dialysis: Secondary | ICD-10-CM | POA: Diagnosis not present

## 2017-09-25 DIAGNOSIS — N186 End stage renal disease: Secondary | ICD-10-CM | POA: Diagnosis not present

## 2017-09-28 DIAGNOSIS — Z992 Dependence on renal dialysis: Secondary | ICD-10-CM | POA: Diagnosis not present

## 2017-09-28 DIAGNOSIS — D631 Anemia in chronic kidney disease: Secondary | ICD-10-CM | POA: Diagnosis not present

## 2017-09-28 DIAGNOSIS — N2581 Secondary hyperparathyroidism of renal origin: Secondary | ICD-10-CM | POA: Diagnosis not present

## 2017-09-28 DIAGNOSIS — N186 End stage renal disease: Secondary | ICD-10-CM | POA: Diagnosis not present

## 2017-09-30 DIAGNOSIS — N186 End stage renal disease: Secondary | ICD-10-CM | POA: Diagnosis not present

## 2017-09-30 DIAGNOSIS — Z992 Dependence on renal dialysis: Secondary | ICD-10-CM | POA: Diagnosis not present

## 2017-09-30 DIAGNOSIS — N2581 Secondary hyperparathyroidism of renal origin: Secondary | ICD-10-CM | POA: Diagnosis not present

## 2017-09-30 DIAGNOSIS — D631 Anemia in chronic kidney disease: Secondary | ICD-10-CM | POA: Diagnosis not present

## 2017-10-02 DIAGNOSIS — Z992 Dependence on renal dialysis: Secondary | ICD-10-CM | POA: Diagnosis not present

## 2017-10-02 DIAGNOSIS — D631 Anemia in chronic kidney disease: Secondary | ICD-10-CM | POA: Diagnosis not present

## 2017-10-02 DIAGNOSIS — N186 End stage renal disease: Secondary | ICD-10-CM | POA: Diagnosis not present

## 2017-10-02 DIAGNOSIS — N2581 Secondary hyperparathyroidism of renal origin: Secondary | ICD-10-CM | POA: Diagnosis not present

## 2017-10-05 DIAGNOSIS — Z992 Dependence on renal dialysis: Secondary | ICD-10-CM | POA: Diagnosis not present

## 2017-10-05 DIAGNOSIS — D631 Anemia in chronic kidney disease: Secondary | ICD-10-CM | POA: Diagnosis not present

## 2017-10-05 DIAGNOSIS — N2581 Secondary hyperparathyroidism of renal origin: Secondary | ICD-10-CM | POA: Diagnosis not present

## 2017-10-05 DIAGNOSIS — N186 End stage renal disease: Secondary | ICD-10-CM | POA: Diagnosis not present

## 2017-10-06 DIAGNOSIS — Z6826 Body mass index (BMI) 26.0-26.9, adult: Secondary | ICD-10-CM | POA: Diagnosis not present

## 2017-10-06 DIAGNOSIS — Z0000 Encounter for general adult medical examination without abnormal findings: Secondary | ICD-10-CM | POA: Diagnosis not present

## 2017-10-06 DIAGNOSIS — Z299 Encounter for prophylactic measures, unspecified: Secondary | ICD-10-CM | POA: Diagnosis not present

## 2017-10-06 DIAGNOSIS — Z1331 Encounter for screening for depression: Secondary | ICD-10-CM | POA: Diagnosis not present

## 2017-10-06 DIAGNOSIS — I10 Essential (primary) hypertension: Secondary | ICD-10-CM | POA: Diagnosis not present

## 2017-10-06 DIAGNOSIS — Z7189 Other specified counseling: Secondary | ICD-10-CM | POA: Diagnosis not present

## 2017-10-06 DIAGNOSIS — Z1339 Encounter for screening examination for other mental health and behavioral disorders: Secondary | ICD-10-CM | POA: Diagnosis not present

## 2017-10-07 DIAGNOSIS — Z23 Encounter for immunization: Secondary | ICD-10-CM | POA: Diagnosis not present

## 2017-10-07 DIAGNOSIS — N2581 Secondary hyperparathyroidism of renal origin: Secondary | ICD-10-CM | POA: Diagnosis not present

## 2017-10-07 DIAGNOSIS — M7552 Bursitis of left shoulder: Secondary | ICD-10-CM | POA: Diagnosis not present

## 2017-10-07 DIAGNOSIS — Z6826 Body mass index (BMI) 26.0-26.9, adult: Secondary | ICD-10-CM | POA: Diagnosis not present

## 2017-10-07 DIAGNOSIS — D631 Anemia in chronic kidney disease: Secondary | ICD-10-CM | POA: Diagnosis not present

## 2017-10-07 DIAGNOSIS — Z992 Dependence on renal dialysis: Secondary | ICD-10-CM | POA: Diagnosis not present

## 2017-10-07 DIAGNOSIS — Z299 Encounter for prophylactic measures, unspecified: Secondary | ICD-10-CM | POA: Diagnosis not present

## 2017-10-07 DIAGNOSIS — N186 End stage renal disease: Secondary | ICD-10-CM | POA: Diagnosis not present

## 2017-10-08 DIAGNOSIS — Z79899 Other long term (current) drug therapy: Secondary | ICD-10-CM | POA: Diagnosis not present

## 2017-10-08 DIAGNOSIS — E7800 Pure hypercholesterolemia, unspecified: Secondary | ICD-10-CM | POA: Diagnosis not present

## 2017-10-08 DIAGNOSIS — E039 Hypothyroidism, unspecified: Secondary | ICD-10-CM | POA: Diagnosis not present

## 2017-10-08 DIAGNOSIS — Z125 Encounter for screening for malignant neoplasm of prostate: Secondary | ICD-10-CM | POA: Diagnosis not present

## 2017-10-09 DIAGNOSIS — D631 Anemia in chronic kidney disease: Secondary | ICD-10-CM | POA: Diagnosis not present

## 2017-10-09 DIAGNOSIS — N2581 Secondary hyperparathyroidism of renal origin: Secondary | ICD-10-CM | POA: Diagnosis not present

## 2017-10-09 DIAGNOSIS — Z992 Dependence on renal dialysis: Secondary | ICD-10-CM | POA: Diagnosis not present

## 2017-10-09 DIAGNOSIS — N186 End stage renal disease: Secondary | ICD-10-CM | POA: Diagnosis not present

## 2017-10-12 DIAGNOSIS — Z992 Dependence on renal dialysis: Secondary | ICD-10-CM | POA: Diagnosis not present

## 2017-10-12 DIAGNOSIS — N186 End stage renal disease: Secondary | ICD-10-CM | POA: Diagnosis not present

## 2017-10-12 DIAGNOSIS — D631 Anemia in chronic kidney disease: Secondary | ICD-10-CM | POA: Diagnosis not present

## 2017-10-13 LAB — CUP PACEART REMOTE DEVICE CHECK
Battery Impedance: 333 Ohm
Battery Voltage: 2.79 V
Brady Statistic AP VS Percent: 1 %
Brady Statistic AS VP Percent: 2 %
Implantable Lead Location: 753859
Implantable Lead Model: 4469
Implantable Lead Model: 4470
Implantable Lead Serial Number: 498205
Implantable Lead Serial Number: 601713
Implantable Pulse Generator Implant Date: 20130212
Lead Channel Pacing Threshold Amplitude: 0.625 V
Lead Channel Pacing Threshold Pulse Width: 0.4 ms
Lead Channel Setting Pacing Amplitude: 2.5 V
Lead Channel Setting Pacing Pulse Width: 0.4 ms
MDC IDC LEAD IMPLANT DT: 20080604
MDC IDC LEAD IMPLANT DT: 20080604
MDC IDC LEAD LOCATION: 753860
MDC IDC MSMT BATTERY REMAINING LONGEVITY: 98 mo
MDC IDC MSMT LEADCHNL RA IMPEDANCE VALUE: 448 Ohm
MDC IDC MSMT LEADCHNL RV IMPEDANCE VALUE: 472 Ohm
MDC IDC MSMT LEADCHNL RV PACING THRESHOLD AMPLITUDE: 0.5 V
MDC IDC MSMT LEADCHNL RV PACING THRESHOLD PULSEWIDTH: 0.4 ms
MDC IDC SESS DTM: 20190904003255
MDC IDC SET LEADCHNL RA PACING AMPLITUDE: 2 V
MDC IDC SET LEADCHNL RV SENSING SENSITIVITY: 2.8 mV
MDC IDC STAT BRADY AP VP PERCENT: 6 %
MDC IDC STAT BRADY AS VS PERCENT: 91 %

## 2017-10-14 DIAGNOSIS — D631 Anemia in chronic kidney disease: Secondary | ICD-10-CM | POA: Diagnosis not present

## 2017-10-14 DIAGNOSIS — N186 End stage renal disease: Secondary | ICD-10-CM | POA: Diagnosis not present

## 2017-10-16 DIAGNOSIS — D631 Anemia in chronic kidney disease: Secondary | ICD-10-CM | POA: Diagnosis not present

## 2017-10-16 DIAGNOSIS — N186 End stage renal disease: Secondary | ICD-10-CM | POA: Diagnosis not present

## 2017-10-18 DIAGNOSIS — N186 End stage renal disease: Secondary | ICD-10-CM | POA: Diagnosis not present

## 2017-10-18 DIAGNOSIS — Z992 Dependence on renal dialysis: Secondary | ICD-10-CM | POA: Diagnosis not present

## 2017-10-19 DIAGNOSIS — N186 End stage renal disease: Secondary | ICD-10-CM | POA: Diagnosis not present

## 2017-10-19 DIAGNOSIS — D509 Iron deficiency anemia, unspecified: Secondary | ICD-10-CM | POA: Diagnosis not present

## 2017-10-19 DIAGNOSIS — D631 Anemia in chronic kidney disease: Secondary | ICD-10-CM | POA: Diagnosis not present

## 2017-10-19 DIAGNOSIS — Z992 Dependence on renal dialysis: Secondary | ICD-10-CM | POA: Diagnosis not present

## 2017-10-21 DIAGNOSIS — N186 End stage renal disease: Secondary | ICD-10-CM | POA: Diagnosis not present

## 2017-10-21 DIAGNOSIS — D631 Anemia in chronic kidney disease: Secondary | ICD-10-CM | POA: Diagnosis not present

## 2017-10-21 DIAGNOSIS — D509 Iron deficiency anemia, unspecified: Secondary | ICD-10-CM | POA: Diagnosis not present

## 2017-10-21 DIAGNOSIS — Z992 Dependence on renal dialysis: Secondary | ICD-10-CM | POA: Diagnosis not present

## 2017-10-23 DIAGNOSIS — N186 End stage renal disease: Secondary | ICD-10-CM | POA: Diagnosis not present

## 2017-10-23 DIAGNOSIS — Z992 Dependence on renal dialysis: Secondary | ICD-10-CM | POA: Diagnosis not present

## 2017-10-23 DIAGNOSIS — D509 Iron deficiency anemia, unspecified: Secondary | ICD-10-CM | POA: Diagnosis not present

## 2017-10-23 DIAGNOSIS — D631 Anemia in chronic kidney disease: Secondary | ICD-10-CM | POA: Diagnosis not present

## 2017-10-26 DIAGNOSIS — D509 Iron deficiency anemia, unspecified: Secondary | ICD-10-CM | POA: Diagnosis not present

## 2017-10-26 DIAGNOSIS — D631 Anemia in chronic kidney disease: Secondary | ICD-10-CM | POA: Diagnosis not present

## 2017-10-26 DIAGNOSIS — N186 End stage renal disease: Secondary | ICD-10-CM | POA: Diagnosis not present

## 2017-10-26 DIAGNOSIS — Z992 Dependence on renal dialysis: Secondary | ICD-10-CM | POA: Diagnosis not present

## 2017-10-28 DIAGNOSIS — D631 Anemia in chronic kidney disease: Secondary | ICD-10-CM | POA: Diagnosis not present

## 2017-10-28 DIAGNOSIS — D509 Iron deficiency anemia, unspecified: Secondary | ICD-10-CM | POA: Diagnosis not present

## 2017-10-28 DIAGNOSIS — Z992 Dependence on renal dialysis: Secondary | ICD-10-CM | POA: Diagnosis not present

## 2017-10-28 DIAGNOSIS — N186 End stage renal disease: Secondary | ICD-10-CM | POA: Diagnosis not present

## 2017-10-30 DIAGNOSIS — N186 End stage renal disease: Secondary | ICD-10-CM | POA: Diagnosis not present

## 2017-10-30 DIAGNOSIS — Z992 Dependence on renal dialysis: Secondary | ICD-10-CM | POA: Diagnosis not present

## 2017-10-30 DIAGNOSIS — D509 Iron deficiency anemia, unspecified: Secondary | ICD-10-CM | POA: Diagnosis not present

## 2017-10-30 DIAGNOSIS — D631 Anemia in chronic kidney disease: Secondary | ICD-10-CM | POA: Diagnosis not present

## 2017-11-01 NOTE — Progress Notes (Signed)
Cardiology Office Note  Date: 11/03/2017   ID: Dale Torres, Dale Torres 1925-08-24, MRN [id number]  PCP: Monico Blitz, MD  Primary Cardiologist: Rozann Lesches, MD   Chief Complaint  Patient presents with  . Cardiomyopathy  . Atrial Fibrillation    History of Present Illness: Dale Torres is a 84 y.o. male last seen in July.  He is here with his son today for a routine visit.  He states that he feels reasonably well, continues with fairly low level activity, using a walker.  He does not report any angina or sense of palpitations although has been in atrial fibrillation more frequently.  He continues with regular hemodialysis and states that he is tolerating these sessions.  He follows in the device clinic with Dr. Rayann Heman, [employer]tronic pacemaker in place.  Most recent device interrogation indicated 99% AT/AF.   I reviewed his medications which are outlined below.  He continues on aspirin, amiodarone, Coreg, and Proamatine.  We have not attempted addition of ARB in light of his tendency to hypotension.  He declines anticoagulation.  Past Medical History:  Diagnosis Date  . Anemia    Dr. Sonny Dandy  . Arthritis   . Benign prostatic hypertrophy   . Carpal tunnel syndrome of left wrist   . Coronary atherosclerosis of native coronary artery    Nonobstructive 2009  . ESRD on hemodialysis ([place])   . Essential hypertension, benign   . GERD (gastroesophageal reflux disease)   . Headache   . History of pneumonia   . HOH (hard of hearing)    Bilateral  hearing aids  . Hypothyroidism   . Persistent atrial fibrillation    Declines coumadin  . Presence of permanent cardiac pacemaker   . Sick sinus syndrome [facility])    Status post pacemaker placement 2008  . Skin cancer, basal cell     Past Surgical History:  Procedure Laterality Date  . A/V FISTULAGRAM N/A 01/06/2017   Procedure: A/V FISTULAGRAM;  Surgeon: Waynetta Sandy, MD;  Location: [place] CV LAB;  Service: Cardiovascular;   Laterality: N/A;  . APPENDECTOMY    . AV FISTULA PLACEMENT Left 08/16/2014   Procedure: Left Arm creation of Brachiocephalic ARTERIOVENOUS (AV) FISTULA ;  Surgeon: Angelia Mould, MD;  Location: [place];  Service: Vascular;  Laterality: Left;  . AV FISTULA PLACEMENT Right 06/24/2016   Procedure: CREATION OF RIGHT RADIOCEPHALIC ARTERIOVENOUS (AV);  Surgeon: Rosetta Posner, MD;  Location: [place];  Service: Vascular;  Laterality: Right;  . CHOLECYSTECTOMY    . COLONOSCOPY W/ BIOPSIES AND POLYPECTOMY    . EYE SURGERY    . INSERTION OF DIALYSIS CATHETER Left 06/24/2016   Procedure: INSERTION OF DIALYSIS CATHETER - left Internal Jugular placement;  Surgeon: Rosetta Posner, MD;  Location: [place];  Service: Vascular;  Laterality: Left;  . IR FLUORO GUIDE CV LINE RIGHT  08/06/2016  . LAMINECTOMY    . LIGATION OF ARTERIOVENOUS  FISTULA Left 06/24/2016   Procedure: LIGATION OF left arm  ARTERIOVENOUS  FISTULA;  Surgeon: Rosetta Posner, MD;  Location: [place];  Service: Vascular;  Laterality: Left;  . PACEMAKER GENERATOR CHANGE  03/03/11   MDT Adaptal L implanted by Dr Rayann Heman  . PACEMAKER PLACEMENT  2008   Medtronic - Dr. Doreatha Lew  . PERIPHERAL VASCULAR CATHETERIZATION Left 03/06/2015   Procedure: Fistulagram;  Surgeon: Serafina Mitchell, MD;  Location: [place] CV LAB;  Service: Cardiovascular;  Laterality: Left;  . PERMANENT PACEMAKER GENERATOR CHANGE  N/A 03/03/2011   Procedure: PERMANENT PACEMAKER GENERATOR CHANGE;  Surgeon: Thompson Grayer, MD;  [facility] C[facility] LAB;  Service: Cardiovascular;  Laterality: N/A;  . Right rotator cuff repair    . THROMBECTOMY AND REVISION OF ARTERIOVENTOUS (AV) GORETEX  GRAFT Left 03/11/2015   Procedure: THROMBECTOMY AND REVISION OF ARTERIOVENTOUS (AV) GORETEX  GRAFT LEFT ARM;  Surgeon: Rosetta Posner, MD;  Location: [place];  Service: Vascular;  Laterality: Left;  . TONSILLECTOMY      Current Outpatient Medications  Medication Sig Dispense Refill  . amiodarone (PACERONE) 200 MG  tablet Take 200 mg by mouth daily.    Marland Kitchen aspirin EC 81 MG tablet Take 81 mg by mouth at bedtime.    Lorin Picket 1 GM 210 MG(Fe) tablet Place 1 tablet inside cheek 2 (two) times daily.    . carvedilol (COREG) 3.125 MG tablet Take 1 tablet (3.125 mg total) by mouth 2 (two) times daily. Except, do not take the morning dose on your dialysis days 180 tablet 1  . docusate sodium (COLACE) 100 MG capsule Take 100 mg by mouth daily.     . folic acid (FOLVITE) 1 MG tablet Take 1 mg by mouth daily.      Marland Kitchen gabapentin (NEURONTIN) 400 MG capsule Take 400 mg by mouth at bedtime.     Marland Kitchen HYDROcodone-acetaminophen (NORCO) 10-325 MG tablet Take 1 tablet by mouth every 6 (six) hours as needed for severe pain. (Patient taking differently: Take 1 tablet by mouth 3 (three) times daily. ) 15 tablet 0  . levothyroxine (SYNTHROID, LEVOTHROID) 125 MCG tablet Take 125 mcg by mouth daily at 2 PM. In the afternoon.    . Melatonin 5 MG TABS Take 5 mg by mouth at bedtime.    . midodrine (PROAMATINE) 10 MG tablet Take 10 mg by mouth 2 (two) times daily.     . naproxen sodium (ALEVE) 220 MG tablet Take 440 mg by mouth daily as needed (for pain.).    Marland Kitchen nitroGLYCERIN (NITROSTAT) 0.4 MG SL tablet Place 1 tablet (0.4 mg total) under the tongue every 5 (five) minutes as needed for chest pain (up to 3 doses. If no relief after 3rd dose, proceed to the ED for an evaluation or call 911). 25 tablet 3  . omeprazole (PRILOSEC) 20 MG capsule Take 20 mg by mouth at bedtime.     . tamsulosin (FLOMAX) 0.4 MG CAPS capsule Take 0.4 mg by mouth at bedtime.     No current facility-administered medications for this visit.    Allergies:  Oxycodone hcl   Social History: The patient  reports that he quit smoking about 57 years ago. His smoking use included cigarettes. He started smoking about 69 years ago. He has a 9.60 pack-year smoking history. He quit smokeless tobacco use about 37 years ago.  His smokeless tobacco use included chew. He reports that he  drinks alcohol. He reports that he does not use drugs.   ROS:  Please see the history of present illness. Otherwise, complete review of systems is positive for hearing loss.  All other systems are reviewed and negative.   Physical Exam: VS:  BP (!) 100/58   Pulse 75   Ht 5' 10"  (1.778 m)   Wt 163 lb 6.4 oz (74.1 kg)   SpO2 97%   BMI 23.45 kg/m , BMI Body mass index is 23.45 kg/m.  Wt Readings from Last 3 Encounters:  11/03/17 163 lb 6.4 oz (74.1 kg)  07/26/17 173 lb  6.4 oz (78.7 kg)  06/11/17 170 lb 12.8 oz (77.5 kg)    General: Elderly male, appears comfortable at rest.  Using a rolling walker. HEENT: Conjunctiva and lids normal, oropharynx clear. Neck: Supple, no elevated JVP or carotid bruits, no thyromegaly. Lungs: Clear to auscultation, nonlabored breathing at rest. Cardiac: Irregularly irregular, no S3, soft systolic murmur. Abdomen: Soft, nontender, bowel sounds present. Extremities: Stable, mild ankle edema, AV fistula left arm, dialysis catheter chest as well. Skin: Warm and dry. Musculoskeletal: No kyphosis. Neuropsychiatric: Alert and oriented x3, affect grossly appropriate.  ECG: I personally reviewed the tracing from 03/22/2017 which showed an atrial paced rhythm with left bundle branch block.  Recent Labwork: 01/06/2017: BUN 31; Creatinine, Ser 4.30; Hemoglobin 9.9; Potassium 4.6; Sodium 135   Other Studies Reviewed Today:  Echocardiogram 06/06/2017 [facility]): LVEF 25 to 30%, MAC with moderate mitral regurgitation, moderate left atrial enlargement, right ventricular dilatation with decreased contraction, mild to moderate tricuspid regurgitation, mild right atrial enlargement.  Assessment and Plan:  1.  Persistent atrial fibrillation.  He declines anticoagulation.  Also not aware of any sense of palpitations.  Plan is to continue aspirin and amiodarone.  Not candidate for cardioversion in the absence of anticoagulation.  May eventually stop  amiodarone and convert to rate control strategy alone, although relatively low blood pressure may limit this as well.  2.  Secondary cardiomyopathy with LVEF 25 to 30%.  We continue conservative management.  He is on Coreg, no ARB or other afterload agents due to propensity to hypotension and need for Proamatine.  3.  End-stage renal disease on hemodialysis.  Current medicines were reviewed with the patient today.  Disposition: Follow-up in 4 months.  Signed, Satira Sark, MD, [facility] 11/03/2017 1:19 PM    [facility] [facility] [place], [place], [place] [address] Phone: [phone]; Fax: [phone]

## 2017-11-02 DIAGNOSIS — D509 Iron deficiency anemia, unspecified: Secondary | ICD-10-CM | POA: Diagnosis not present

## 2017-11-02 DIAGNOSIS — N186 End stage renal disease: Secondary | ICD-10-CM | POA: Diagnosis not present

## 2017-11-02 DIAGNOSIS — D631 Anemia in chronic kidney disease: Secondary | ICD-10-CM | POA: Diagnosis not present

## 2017-11-02 DIAGNOSIS — Z992 Dependence on renal dialysis: Secondary | ICD-10-CM | POA: Diagnosis not present

## 2017-11-03 ENCOUNTER — Ambulatory Visit (INDEPENDENT_AMBULATORY_CARE_PROVIDER_SITE_OTHER): Payer: Medicare Other | Admitting: Cardiology

## 2017-11-03 ENCOUNTER — Encounter: Payer: Self-pay | Admitting: Cardiology

## 2017-11-03 VITALS — BP 100/58 | HR 75 | Ht 70.0 in | Wt 163.4 lb

## 2017-11-03 DIAGNOSIS — Z992 Dependence on renal dialysis: Secondary | ICD-10-CM | POA: Diagnosis not present

## 2017-11-03 DIAGNOSIS — N186 End stage renal disease: Secondary | ICD-10-CM | POA: Diagnosis not present

## 2017-11-03 DIAGNOSIS — I429 Cardiomyopathy, unspecified: Secondary | ICD-10-CM

## 2017-11-03 DIAGNOSIS — I2510 Atherosclerotic heart disease of native coronary artery without angina pectoris: Secondary | ICD-10-CM

## 2017-11-03 DIAGNOSIS — I4819 Other persistent atrial fibrillation: Secondary | ICD-10-CM

## 2017-11-03 NOTE — Patient Instructions (Addendum)
Medication Instructions:   Your physician recommends that you continue on your current medications as directed. Please refer to the Current Medication list given to you today.  Labwork:  NONE  Testing/Procedures:  NONE  Follow-Up:  Your physician recommends that you schedule a follow-up appointment in: 4 months.  Any Other Special Instructions Will Be Listed Below (If Applicable).  If you need a refill on your cardiac medications before your next appointment, please call your pharmacy. 

## 2017-11-05 DIAGNOSIS — D509 Iron deficiency anemia, unspecified: Secondary | ICD-10-CM | POA: Diagnosis not present

## 2017-11-05 DIAGNOSIS — Z992 Dependence on renal dialysis: Secondary | ICD-10-CM | POA: Diagnosis not present

## 2017-11-05 DIAGNOSIS — D631 Anemia in chronic kidney disease: Secondary | ICD-10-CM | POA: Diagnosis not present

## 2017-11-05 DIAGNOSIS — N186 End stage renal disease: Secondary | ICD-10-CM | POA: Diagnosis not present

## 2017-11-06 DIAGNOSIS — D509 Iron deficiency anemia, unspecified: Secondary | ICD-10-CM | POA: Diagnosis not present

## 2017-11-06 DIAGNOSIS — N186 End stage renal disease: Secondary | ICD-10-CM | POA: Diagnosis not present

## 2017-11-06 DIAGNOSIS — D631 Anemia in chronic kidney disease: Secondary | ICD-10-CM | POA: Diagnosis not present

## 2017-11-06 DIAGNOSIS — Z992 Dependence on renal dialysis: Secondary | ICD-10-CM | POA: Diagnosis not present

## 2017-11-09 DIAGNOSIS — D509 Iron deficiency anemia, unspecified: Secondary | ICD-10-CM | POA: Diagnosis not present

## 2017-11-09 DIAGNOSIS — D631 Anemia in chronic kidney disease: Secondary | ICD-10-CM | POA: Diagnosis not present

## 2017-11-09 DIAGNOSIS — N186 End stage renal disease: Secondary | ICD-10-CM | POA: Diagnosis not present

## 2017-11-09 DIAGNOSIS — Z992 Dependence on renal dialysis: Secondary | ICD-10-CM | POA: Diagnosis not present

## 2017-11-11 DIAGNOSIS — D631 Anemia in chronic kidney disease: Secondary | ICD-10-CM | POA: Diagnosis not present

## 2017-11-11 DIAGNOSIS — Z992 Dependence on renal dialysis: Secondary | ICD-10-CM | POA: Diagnosis not present

## 2017-11-11 DIAGNOSIS — D509 Iron deficiency anemia, unspecified: Secondary | ICD-10-CM | POA: Diagnosis not present

## 2017-11-11 DIAGNOSIS — N186 End stage renal disease: Secondary | ICD-10-CM | POA: Diagnosis not present

## 2017-11-13 DIAGNOSIS — Z992 Dependence on renal dialysis: Secondary | ICD-10-CM | POA: Diagnosis not present

## 2017-11-13 DIAGNOSIS — D509 Iron deficiency anemia, unspecified: Secondary | ICD-10-CM | POA: Diagnosis not present

## 2017-11-13 DIAGNOSIS — N186 End stage renal disease: Secondary | ICD-10-CM | POA: Diagnosis not present

## 2017-11-13 DIAGNOSIS — D631 Anemia in chronic kidney disease: Secondary | ICD-10-CM | POA: Diagnosis not present

## 2017-11-15 DIAGNOSIS — M549 Dorsalgia, unspecified: Secondary | ICD-10-CM | POA: Diagnosis not present

## 2017-11-15 DIAGNOSIS — E039 Hypothyroidism, unspecified: Secondary | ICD-10-CM | POA: Diagnosis not present

## 2017-11-15 DIAGNOSIS — Z6826 Body mass index (BMI) 26.0-26.9, adult: Secondary | ICD-10-CM | POA: Diagnosis not present

## 2017-11-15 DIAGNOSIS — Z299 Encounter for prophylactic measures, unspecified: Secondary | ICD-10-CM | POA: Diagnosis not present

## 2017-11-15 DIAGNOSIS — I10 Essential (primary) hypertension: Secondary | ICD-10-CM | POA: Diagnosis not present

## 2017-11-16 DIAGNOSIS — N186 End stage renal disease: Secondary | ICD-10-CM | POA: Diagnosis not present

## 2017-11-16 DIAGNOSIS — D631 Anemia in chronic kidney disease: Secondary | ICD-10-CM | POA: Diagnosis not present

## 2017-11-16 DIAGNOSIS — D509 Iron deficiency anemia, unspecified: Secondary | ICD-10-CM | POA: Diagnosis not present

## 2017-11-16 DIAGNOSIS — Z992 Dependence on renal dialysis: Secondary | ICD-10-CM | POA: Diagnosis not present

## 2017-11-18 DIAGNOSIS — Z992 Dependence on renal dialysis: Secondary | ICD-10-CM | POA: Diagnosis not present

## 2017-11-18 DIAGNOSIS — N186 End stage renal disease: Secondary | ICD-10-CM | POA: Diagnosis not present

## 2017-11-18 DIAGNOSIS — D509 Iron deficiency anemia, unspecified: Secondary | ICD-10-CM | POA: Diagnosis not present

## 2017-11-18 DIAGNOSIS — D631 Anemia in chronic kidney disease: Secondary | ICD-10-CM | POA: Diagnosis not present

## 2017-11-20 DIAGNOSIS — N186 End stage renal disease: Secondary | ICD-10-CM | POA: Diagnosis not present

## 2017-11-20 DIAGNOSIS — D509 Iron deficiency anemia, unspecified: Secondary | ICD-10-CM | POA: Diagnosis not present

## 2017-11-20 DIAGNOSIS — Z992 Dependence on renal dialysis: Secondary | ICD-10-CM | POA: Diagnosis not present

## 2017-11-20 DIAGNOSIS — D631 Anemia in chronic kidney disease: Secondary | ICD-10-CM | POA: Diagnosis not present

## 2017-11-23 DIAGNOSIS — D509 Iron deficiency anemia, unspecified: Secondary | ICD-10-CM | POA: Diagnosis not present

## 2017-11-23 DIAGNOSIS — D631 Anemia in chronic kidney disease: Secondary | ICD-10-CM | POA: Diagnosis not present

## 2017-11-23 DIAGNOSIS — N186 End stage renal disease: Secondary | ICD-10-CM | POA: Diagnosis not present

## 2017-11-23 DIAGNOSIS — Z992 Dependence on renal dialysis: Secondary | ICD-10-CM | POA: Diagnosis not present

## 2017-11-25 DIAGNOSIS — N186 End stage renal disease: Secondary | ICD-10-CM | POA: Diagnosis not present

## 2017-11-25 DIAGNOSIS — Z992 Dependence on renal dialysis: Secondary | ICD-10-CM | POA: Diagnosis not present

## 2017-11-25 DIAGNOSIS — D631 Anemia in chronic kidney disease: Secondary | ICD-10-CM | POA: Diagnosis not present

## 2017-11-25 DIAGNOSIS — D509 Iron deficiency anemia, unspecified: Secondary | ICD-10-CM | POA: Diagnosis not present

## 2017-11-27 DIAGNOSIS — D509 Iron deficiency anemia, unspecified: Secondary | ICD-10-CM | POA: Diagnosis not present

## 2017-11-27 DIAGNOSIS — Z992 Dependence on renal dialysis: Secondary | ICD-10-CM | POA: Diagnosis not present

## 2017-11-27 DIAGNOSIS — N186 End stage renal disease: Secondary | ICD-10-CM | POA: Diagnosis not present

## 2017-11-27 DIAGNOSIS — D631 Anemia in chronic kidney disease: Secondary | ICD-10-CM | POA: Diagnosis not present

## 2017-11-30 DIAGNOSIS — Z992 Dependence on renal dialysis: Secondary | ICD-10-CM | POA: Diagnosis not present

## 2017-11-30 DIAGNOSIS — N186 End stage renal disease: Secondary | ICD-10-CM | POA: Diagnosis not present

## 2017-11-30 DIAGNOSIS — D509 Iron deficiency anemia, unspecified: Secondary | ICD-10-CM | POA: Diagnosis not present

## 2017-11-30 DIAGNOSIS — D631 Anemia in chronic kidney disease: Secondary | ICD-10-CM | POA: Diagnosis not present

## 2017-12-02 DIAGNOSIS — D509 Iron deficiency anemia, unspecified: Secondary | ICD-10-CM | POA: Diagnosis not present

## 2017-12-02 DIAGNOSIS — D631 Anemia in chronic kidney disease: Secondary | ICD-10-CM | POA: Diagnosis not present

## 2017-12-02 DIAGNOSIS — Z992 Dependence on renal dialysis: Secondary | ICD-10-CM | POA: Diagnosis not present

## 2017-12-02 DIAGNOSIS — N186 End stage renal disease: Secondary | ICD-10-CM | POA: Diagnosis not present

## 2017-12-04 DIAGNOSIS — D631 Anemia in chronic kidney disease: Secondary | ICD-10-CM | POA: Diagnosis not present

## 2017-12-04 DIAGNOSIS — D509 Iron deficiency anemia, unspecified: Secondary | ICD-10-CM | POA: Diagnosis not present

## 2017-12-04 DIAGNOSIS — N186 End stage renal disease: Secondary | ICD-10-CM | POA: Diagnosis not present

## 2017-12-04 DIAGNOSIS — Z992 Dependence on renal dialysis: Secondary | ICD-10-CM | POA: Diagnosis not present

## 2017-12-07 DIAGNOSIS — D631 Anemia in chronic kidney disease: Secondary | ICD-10-CM | POA: Diagnosis not present

## 2017-12-07 DIAGNOSIS — D509 Iron deficiency anemia, unspecified: Secondary | ICD-10-CM | POA: Diagnosis not present

## 2017-12-07 DIAGNOSIS — Z992 Dependence on renal dialysis: Secondary | ICD-10-CM | POA: Diagnosis not present

## 2017-12-07 DIAGNOSIS — N186 End stage renal disease: Secondary | ICD-10-CM | POA: Diagnosis not present

## 2017-12-09 DIAGNOSIS — N186 End stage renal disease: Secondary | ICD-10-CM | POA: Diagnosis not present

## 2017-12-09 DIAGNOSIS — D509 Iron deficiency anemia, unspecified: Secondary | ICD-10-CM | POA: Diagnosis not present

## 2017-12-09 DIAGNOSIS — Z992 Dependence on renal dialysis: Secondary | ICD-10-CM | POA: Diagnosis not present

## 2017-12-09 DIAGNOSIS — D631 Anemia in chronic kidney disease: Secondary | ICD-10-CM | POA: Diagnosis not present

## 2017-12-11 DIAGNOSIS — N186 End stage renal disease: Secondary | ICD-10-CM | POA: Diagnosis not present

## 2017-12-11 DIAGNOSIS — D509 Iron deficiency anemia, unspecified: Secondary | ICD-10-CM | POA: Diagnosis not present

## 2017-12-11 DIAGNOSIS — Z992 Dependence on renal dialysis: Secondary | ICD-10-CM | POA: Diagnosis not present

## 2017-12-11 DIAGNOSIS — D631 Anemia in chronic kidney disease: Secondary | ICD-10-CM | POA: Diagnosis not present

## 2017-12-14 DIAGNOSIS — D631 Anemia in chronic kidney disease: Secondary | ICD-10-CM | POA: Diagnosis not present

## 2017-12-14 DIAGNOSIS — Z992 Dependence on renal dialysis: Secondary | ICD-10-CM | POA: Diagnosis not present

## 2017-12-14 DIAGNOSIS — N186 End stage renal disease: Secondary | ICD-10-CM | POA: Diagnosis not present

## 2017-12-14 DIAGNOSIS — D509 Iron deficiency anemia, unspecified: Secondary | ICD-10-CM | POA: Diagnosis not present

## 2017-12-15 DIAGNOSIS — I4891 Unspecified atrial fibrillation: Secondary | ICD-10-CM | POA: Diagnosis not present

## 2017-12-15 DIAGNOSIS — I770 Arteriovenous fistula, acquired: Secondary | ICD-10-CM | POA: Diagnosis not present

## 2017-12-15 DIAGNOSIS — N185 Chronic kidney disease, stage 5: Secondary | ICD-10-CM | POA: Diagnosis not present

## 2017-12-15 DIAGNOSIS — Z6826 Body mass index (BMI) 26.0-26.9, adult: Secondary | ICD-10-CM | POA: Diagnosis not present

## 2017-12-15 DIAGNOSIS — Z299 Encounter for prophylactic measures, unspecified: Secondary | ICD-10-CM | POA: Diagnosis not present

## 2017-12-15 DIAGNOSIS — I10 Essential (primary) hypertension: Secondary | ICD-10-CM | POA: Diagnosis not present

## 2017-12-16 DIAGNOSIS — Z992 Dependence on renal dialysis: Secondary | ICD-10-CM | POA: Diagnosis not present

## 2017-12-16 DIAGNOSIS — D509 Iron deficiency anemia, unspecified: Secondary | ICD-10-CM | POA: Diagnosis not present

## 2017-12-16 DIAGNOSIS — N186 End stage renal disease: Secondary | ICD-10-CM | POA: Diagnosis not present

## 2017-12-16 DIAGNOSIS — D631 Anemia in chronic kidney disease: Secondary | ICD-10-CM | POA: Diagnosis not present

## 2017-12-18 DIAGNOSIS — N186 End stage renal disease: Secondary | ICD-10-CM | POA: Diagnosis not present

## 2017-12-18 DIAGNOSIS — D509 Iron deficiency anemia, unspecified: Secondary | ICD-10-CM | POA: Diagnosis not present

## 2017-12-18 DIAGNOSIS — Z992 Dependence on renal dialysis: Secondary | ICD-10-CM | POA: Diagnosis not present

## 2017-12-18 DIAGNOSIS — D631 Anemia in chronic kidney disease: Secondary | ICD-10-CM | POA: Diagnosis not present

## 2017-12-21 ENCOUNTER — Telehealth: Payer: Self-pay

## 2017-12-21 ENCOUNTER — Ambulatory Visit (INDEPENDENT_AMBULATORY_CARE_PROVIDER_SITE_OTHER): Payer: Medicare Other

## 2017-12-21 DIAGNOSIS — N186 End stage renal disease: Secondary | ICD-10-CM | POA: Diagnosis not present

## 2017-12-21 DIAGNOSIS — I429 Cardiomyopathy, unspecified: Secondary | ICD-10-CM

## 2017-12-21 DIAGNOSIS — Z992 Dependence on renal dialysis: Secondary | ICD-10-CM | POA: Diagnosis not present

## 2017-12-21 DIAGNOSIS — N2581 Secondary hyperparathyroidism of renal origin: Secondary | ICD-10-CM | POA: Diagnosis not present

## 2017-12-21 DIAGNOSIS — I495 Sick sinus syndrome: Secondary | ICD-10-CM

## 2017-12-21 DIAGNOSIS — D631 Anemia in chronic kidney disease: Secondary | ICD-10-CM | POA: Diagnosis not present

## 2017-12-21 DIAGNOSIS — D509 Iron deficiency anemia, unspecified: Secondary | ICD-10-CM | POA: Diagnosis not present

## 2017-12-21 NOTE — Telephone Encounter (Signed)
LMOVM reminding pt to send remote transmission.   

## 2017-12-22 NOTE — Progress Notes (Signed)
Remote pacemaker transmission.   

## 2017-12-23 DIAGNOSIS — N2581 Secondary hyperparathyroidism of renal origin: Secondary | ICD-10-CM | POA: Diagnosis not present

## 2017-12-23 DIAGNOSIS — N186 End stage renal disease: Secondary | ICD-10-CM | POA: Diagnosis not present

## 2017-12-23 DIAGNOSIS — D509 Iron deficiency anemia, unspecified: Secondary | ICD-10-CM | POA: Diagnosis not present

## 2017-12-23 DIAGNOSIS — Z992 Dependence on renal dialysis: Secondary | ICD-10-CM | POA: Diagnosis not present

## 2017-12-23 DIAGNOSIS — D631 Anemia in chronic kidney disease: Secondary | ICD-10-CM | POA: Diagnosis not present

## 2017-12-25 DIAGNOSIS — Z992 Dependence on renal dialysis: Secondary | ICD-10-CM | POA: Diagnosis not present

## 2017-12-25 DIAGNOSIS — D509 Iron deficiency anemia, unspecified: Secondary | ICD-10-CM | POA: Diagnosis not present

## 2017-12-25 DIAGNOSIS — N2581 Secondary hyperparathyroidism of renal origin: Secondary | ICD-10-CM | POA: Diagnosis not present

## 2017-12-25 DIAGNOSIS — D631 Anemia in chronic kidney disease: Secondary | ICD-10-CM | POA: Diagnosis not present

## 2017-12-25 DIAGNOSIS — N186 End stage renal disease: Secondary | ICD-10-CM | POA: Diagnosis not present

## 2017-12-28 ENCOUNTER — Encounter: Payer: Self-pay | Admitting: Cardiology

## 2017-12-28 DIAGNOSIS — D509 Iron deficiency anemia, unspecified: Secondary | ICD-10-CM | POA: Diagnosis not present

## 2017-12-28 DIAGNOSIS — D631 Anemia in chronic kidney disease: Secondary | ICD-10-CM | POA: Diagnosis not present

## 2017-12-28 DIAGNOSIS — N186 End stage renal disease: Secondary | ICD-10-CM | POA: Diagnosis not present

## 2017-12-28 DIAGNOSIS — Z992 Dependence on renal dialysis: Secondary | ICD-10-CM | POA: Diagnosis not present

## 2017-12-28 DIAGNOSIS — N2581 Secondary hyperparathyroidism of renal origin: Secondary | ICD-10-CM | POA: Diagnosis not present

## 2017-12-30 DIAGNOSIS — Z992 Dependence on renal dialysis: Secondary | ICD-10-CM | POA: Diagnosis not present

## 2017-12-30 DIAGNOSIS — N186 End stage renal disease: Secondary | ICD-10-CM | POA: Diagnosis not present

## 2017-12-30 DIAGNOSIS — D509 Iron deficiency anemia, unspecified: Secondary | ICD-10-CM | POA: Diagnosis not present

## 2017-12-30 DIAGNOSIS — D631 Anemia in chronic kidney disease: Secondary | ICD-10-CM | POA: Diagnosis not present

## 2017-12-30 DIAGNOSIS — N2581 Secondary hyperparathyroidism of renal origin: Secondary | ICD-10-CM | POA: Diagnosis not present

## 2018-01-01 DIAGNOSIS — N186 End stage renal disease: Secondary | ICD-10-CM | POA: Diagnosis not present

## 2018-01-01 DIAGNOSIS — N2581 Secondary hyperparathyroidism of renal origin: Secondary | ICD-10-CM | POA: Diagnosis not present

## 2018-01-01 DIAGNOSIS — Z992 Dependence on renal dialysis: Secondary | ICD-10-CM | POA: Diagnosis not present

## 2018-01-01 DIAGNOSIS — D631 Anemia in chronic kidney disease: Secondary | ICD-10-CM | POA: Diagnosis not present

## 2018-01-01 DIAGNOSIS — D509 Iron deficiency anemia, unspecified: Secondary | ICD-10-CM | POA: Diagnosis not present

## 2018-01-04 DIAGNOSIS — N186 End stage renal disease: Secondary | ICD-10-CM | POA: Diagnosis not present

## 2018-01-04 DIAGNOSIS — N2581 Secondary hyperparathyroidism of renal origin: Secondary | ICD-10-CM | POA: Diagnosis not present

## 2018-01-04 DIAGNOSIS — D631 Anemia in chronic kidney disease: Secondary | ICD-10-CM | POA: Diagnosis not present

## 2018-01-04 DIAGNOSIS — Z992 Dependence on renal dialysis: Secondary | ICD-10-CM | POA: Diagnosis not present

## 2018-01-04 DIAGNOSIS — D509 Iron deficiency anemia, unspecified: Secondary | ICD-10-CM | POA: Diagnosis not present

## 2018-01-06 DIAGNOSIS — N2581 Secondary hyperparathyroidism of renal origin: Secondary | ICD-10-CM | POA: Diagnosis not present

## 2018-01-06 DIAGNOSIS — D509 Iron deficiency anemia, unspecified: Secondary | ICD-10-CM | POA: Diagnosis not present

## 2018-01-06 DIAGNOSIS — N186 End stage renal disease: Secondary | ICD-10-CM | POA: Diagnosis not present

## 2018-01-06 DIAGNOSIS — D631 Anemia in chronic kidney disease: Secondary | ICD-10-CM | POA: Diagnosis not present

## 2018-01-06 DIAGNOSIS — Z992 Dependence on renal dialysis: Secondary | ICD-10-CM | POA: Diagnosis not present

## 2018-01-08 DIAGNOSIS — D631 Anemia in chronic kidney disease: Secondary | ICD-10-CM | POA: Diagnosis not present

## 2018-01-08 DIAGNOSIS — N2581 Secondary hyperparathyroidism of renal origin: Secondary | ICD-10-CM | POA: Diagnosis not present

## 2018-01-08 DIAGNOSIS — N186 End stage renal disease: Secondary | ICD-10-CM | POA: Diagnosis not present

## 2018-01-08 DIAGNOSIS — D509 Iron deficiency anemia, unspecified: Secondary | ICD-10-CM | POA: Diagnosis not present

## 2018-01-08 DIAGNOSIS — Z992 Dependence on renal dialysis: Secondary | ICD-10-CM | POA: Diagnosis not present

## 2018-01-10 DIAGNOSIS — Z992 Dependence on renal dialysis: Secondary | ICD-10-CM | POA: Diagnosis not present

## 2018-01-10 DIAGNOSIS — D631 Anemia in chronic kidney disease: Secondary | ICD-10-CM | POA: Diagnosis not present

## 2018-01-10 DIAGNOSIS — N186 End stage renal disease: Secondary | ICD-10-CM | POA: Diagnosis not present

## 2018-01-10 DIAGNOSIS — N2581 Secondary hyperparathyroidism of renal origin: Secondary | ICD-10-CM | POA: Diagnosis not present

## 2018-01-10 DIAGNOSIS — D509 Iron deficiency anemia, unspecified: Secondary | ICD-10-CM | POA: Diagnosis not present

## 2018-01-13 DIAGNOSIS — N2581 Secondary hyperparathyroidism of renal origin: Secondary | ICD-10-CM | POA: Diagnosis not present

## 2018-01-13 DIAGNOSIS — N186 End stage renal disease: Secondary | ICD-10-CM | POA: Diagnosis not present

## 2018-01-13 DIAGNOSIS — Z992 Dependence on renal dialysis: Secondary | ICD-10-CM | POA: Diagnosis not present

## 2018-01-13 DIAGNOSIS — D631 Anemia in chronic kidney disease: Secondary | ICD-10-CM | POA: Diagnosis not present

## 2018-01-13 DIAGNOSIS — D509 Iron deficiency anemia, unspecified: Secondary | ICD-10-CM | POA: Diagnosis not present

## 2018-01-15 DIAGNOSIS — D509 Iron deficiency anemia, unspecified: Secondary | ICD-10-CM | POA: Diagnosis not present

## 2018-01-15 DIAGNOSIS — D631 Anemia in chronic kidney disease: Secondary | ICD-10-CM | POA: Diagnosis not present

## 2018-01-15 DIAGNOSIS — N186 End stage renal disease: Secondary | ICD-10-CM | POA: Diagnosis not present

## 2018-01-15 DIAGNOSIS — Z992 Dependence on renal dialysis: Secondary | ICD-10-CM | POA: Diagnosis not present

## 2018-01-15 DIAGNOSIS — N2581 Secondary hyperparathyroidism of renal origin: Secondary | ICD-10-CM | POA: Diagnosis not present

## 2018-01-17 DIAGNOSIS — I10 Essential (primary) hypertension: Secondary | ICD-10-CM | POA: Diagnosis not present

## 2018-01-17 DIAGNOSIS — J309 Allergic rhinitis, unspecified: Secondary | ICD-10-CM | POA: Diagnosis not present

## 2018-01-17 DIAGNOSIS — Z6827 Body mass index (BMI) 27.0-27.9, adult: Secondary | ICD-10-CM | POA: Diagnosis not present

## 2018-01-17 DIAGNOSIS — M1710 Unilateral primary osteoarthritis, unspecified knee: Secondary | ICD-10-CM | POA: Diagnosis not present

## 2018-01-17 DIAGNOSIS — Z299 Encounter for prophylactic measures, unspecified: Secondary | ICD-10-CM | POA: Diagnosis not present

## 2018-01-18 DIAGNOSIS — D631 Anemia in chronic kidney disease: Secondary | ICD-10-CM | POA: Diagnosis not present

## 2018-01-18 DIAGNOSIS — Z992 Dependence on renal dialysis: Secondary | ICD-10-CM | POA: Diagnosis not present

## 2018-01-18 DIAGNOSIS — N2581 Secondary hyperparathyroidism of renal origin: Secondary | ICD-10-CM | POA: Diagnosis not present

## 2018-01-18 DIAGNOSIS — N186 End stage renal disease: Secondary | ICD-10-CM | POA: Diagnosis not present

## 2018-01-18 DIAGNOSIS — D509 Iron deficiency anemia, unspecified: Secondary | ICD-10-CM | POA: Diagnosis not present

## 2018-01-19 LAB — CUP PACEART REMOTE DEVICE CHECK
Battery Remaining Longevity: 98 mo
Battery Voltage: 2.79 V
Brady Statistic AP VP Percent: 2 %
Brady Statistic AS VP Percent: 1 %
Implantable Lead Implant Date: 20080604
Implantable Lead Location: 753859
Implantable Lead Model: 4470
Implantable Lead Serial Number: 498205
Implantable Pulse Generator Implant Date: 20130212
Lead Channel Impedance Value: 430 Ohm
Lead Channel Pacing Threshold Amplitude: 0.5 V
Lead Channel Pacing Threshold Amplitude: 0.625 V
Lead Channel Setting Pacing Amplitude: 2 V
Lead Channel Setting Pacing Amplitude: 2.5 V
Lead Channel Setting Pacing Pulse Width: 0.4 ms
Lead Channel Setting Sensing Sensitivity: 4 mV
MDC IDC LEAD IMPLANT DT: 20080604
MDC IDC LEAD LOCATION: 753860
MDC IDC LEAD SERIAL: 601713
MDC IDC MSMT BATTERY IMPEDANCE: 357 Ohm
MDC IDC MSMT LEADCHNL RA PACING THRESHOLD PULSEWIDTH: 0.4 ms
MDC IDC MSMT LEADCHNL RV IMPEDANCE VALUE: 453 Ohm
MDC IDC MSMT LEADCHNL RV PACING THRESHOLD PULSEWIDTH: 0.4 ms
MDC IDC SESS DTM: 20191203231610
MDC IDC STAT BRADY AP VS PERCENT: 1 %
MDC IDC STAT BRADY AS VS PERCENT: 97 %

## 2018-01-20 DIAGNOSIS — N186 End stage renal disease: Secondary | ICD-10-CM | POA: Diagnosis not present

## 2018-01-20 DIAGNOSIS — Z992 Dependence on renal dialysis: Secondary | ICD-10-CM | POA: Diagnosis not present

## 2018-01-20 DIAGNOSIS — D509 Iron deficiency anemia, unspecified: Secondary | ICD-10-CM | POA: Diagnosis not present

## 2018-01-20 DIAGNOSIS — D631 Anemia in chronic kidney disease: Secondary | ICD-10-CM | POA: Diagnosis not present

## 2018-01-20 DIAGNOSIS — N2581 Secondary hyperparathyroidism of renal origin: Secondary | ICD-10-CM | POA: Diagnosis not present

## 2018-01-22 DIAGNOSIS — Z992 Dependence on renal dialysis: Secondary | ICD-10-CM | POA: Diagnosis not present

## 2018-01-22 DIAGNOSIS — N2581 Secondary hyperparathyroidism of renal origin: Secondary | ICD-10-CM | POA: Diagnosis not present

## 2018-01-22 DIAGNOSIS — N186 End stage renal disease: Secondary | ICD-10-CM | POA: Diagnosis not present

## 2018-01-22 DIAGNOSIS — D631 Anemia in chronic kidney disease: Secondary | ICD-10-CM | POA: Diagnosis not present

## 2018-01-22 DIAGNOSIS — D509 Iron deficiency anemia, unspecified: Secondary | ICD-10-CM | POA: Diagnosis not present

## 2018-01-25 DIAGNOSIS — N186 End stage renal disease: Secondary | ICD-10-CM | POA: Diagnosis not present

## 2018-01-25 DIAGNOSIS — Z992 Dependence on renal dialysis: Secondary | ICD-10-CM | POA: Diagnosis not present

## 2018-01-25 DIAGNOSIS — N2581 Secondary hyperparathyroidism of renal origin: Secondary | ICD-10-CM | POA: Diagnosis not present

## 2018-01-25 DIAGNOSIS — D631 Anemia in chronic kidney disease: Secondary | ICD-10-CM | POA: Diagnosis not present

## 2018-01-25 DIAGNOSIS — D509 Iron deficiency anemia, unspecified: Secondary | ICD-10-CM | POA: Diagnosis not present

## 2018-01-27 DIAGNOSIS — Z992 Dependence on renal dialysis: Secondary | ICD-10-CM | POA: Diagnosis not present

## 2018-01-27 DIAGNOSIS — N186 End stage renal disease: Secondary | ICD-10-CM | POA: Diagnosis not present

## 2018-01-27 DIAGNOSIS — D631 Anemia in chronic kidney disease: Secondary | ICD-10-CM | POA: Diagnosis not present

## 2018-01-27 DIAGNOSIS — D509 Iron deficiency anemia, unspecified: Secondary | ICD-10-CM | POA: Diagnosis not present

## 2018-01-27 DIAGNOSIS — N2581 Secondary hyperparathyroidism of renal origin: Secondary | ICD-10-CM | POA: Diagnosis not present

## 2018-01-29 DIAGNOSIS — N186 End stage renal disease: Secondary | ICD-10-CM | POA: Diagnosis not present

## 2018-01-29 DIAGNOSIS — D509 Iron deficiency anemia, unspecified: Secondary | ICD-10-CM | POA: Diagnosis not present

## 2018-01-29 DIAGNOSIS — Z992 Dependence on renal dialysis: Secondary | ICD-10-CM | POA: Diagnosis not present

## 2018-01-29 DIAGNOSIS — N2581 Secondary hyperparathyroidism of renal origin: Secondary | ICD-10-CM | POA: Diagnosis not present

## 2018-01-29 DIAGNOSIS — D631 Anemia in chronic kidney disease: Secondary | ICD-10-CM | POA: Diagnosis not present

## 2018-02-01 DIAGNOSIS — N186 End stage renal disease: Secondary | ICD-10-CM | POA: Diagnosis not present

## 2018-02-01 DIAGNOSIS — N2581 Secondary hyperparathyroidism of renal origin: Secondary | ICD-10-CM | POA: Diagnosis not present

## 2018-02-01 DIAGNOSIS — D509 Iron deficiency anemia, unspecified: Secondary | ICD-10-CM | POA: Diagnosis not present

## 2018-02-01 DIAGNOSIS — Z992 Dependence on renal dialysis: Secondary | ICD-10-CM | POA: Diagnosis not present

## 2018-02-01 DIAGNOSIS — D631 Anemia in chronic kidney disease: Secondary | ICD-10-CM | POA: Diagnosis not present

## 2018-02-03 DIAGNOSIS — D509 Iron deficiency anemia, unspecified: Secondary | ICD-10-CM | POA: Diagnosis not present

## 2018-02-03 DIAGNOSIS — Z992 Dependence on renal dialysis: Secondary | ICD-10-CM | POA: Diagnosis not present

## 2018-02-03 DIAGNOSIS — N186 End stage renal disease: Secondary | ICD-10-CM | POA: Diagnosis not present

## 2018-02-03 DIAGNOSIS — D631 Anemia in chronic kidney disease: Secondary | ICD-10-CM | POA: Diagnosis not present

## 2018-02-03 DIAGNOSIS — N2581 Secondary hyperparathyroidism of renal origin: Secondary | ICD-10-CM | POA: Diagnosis not present

## 2018-02-05 DIAGNOSIS — D509 Iron deficiency anemia, unspecified: Secondary | ICD-10-CM | POA: Diagnosis not present

## 2018-02-05 DIAGNOSIS — N2581 Secondary hyperparathyroidism of renal origin: Secondary | ICD-10-CM | POA: Diagnosis not present

## 2018-02-05 DIAGNOSIS — D631 Anemia in chronic kidney disease: Secondary | ICD-10-CM | POA: Diagnosis not present

## 2018-02-05 DIAGNOSIS — N186 End stage renal disease: Secondary | ICD-10-CM | POA: Diagnosis not present

## 2018-02-05 DIAGNOSIS — Z992 Dependence on renal dialysis: Secondary | ICD-10-CM | POA: Diagnosis not present

## 2018-02-08 DIAGNOSIS — N186 End stage renal disease: Secondary | ICD-10-CM | POA: Diagnosis not present

## 2018-02-08 DIAGNOSIS — D509 Iron deficiency anemia, unspecified: Secondary | ICD-10-CM | POA: Diagnosis not present

## 2018-02-08 DIAGNOSIS — Z992 Dependence on renal dialysis: Secondary | ICD-10-CM | POA: Diagnosis not present

## 2018-02-08 DIAGNOSIS — D631 Anemia in chronic kidney disease: Secondary | ICD-10-CM | POA: Diagnosis not present

## 2018-02-08 DIAGNOSIS — N2581 Secondary hyperparathyroidism of renal origin: Secondary | ICD-10-CM | POA: Diagnosis not present

## 2018-02-10 DIAGNOSIS — D509 Iron deficiency anemia, unspecified: Secondary | ICD-10-CM | POA: Diagnosis not present

## 2018-02-10 DIAGNOSIS — N2581 Secondary hyperparathyroidism of renal origin: Secondary | ICD-10-CM | POA: Diagnosis not present

## 2018-02-10 DIAGNOSIS — N186 End stage renal disease: Secondary | ICD-10-CM | POA: Diagnosis not present

## 2018-02-10 DIAGNOSIS — D631 Anemia in chronic kidney disease: Secondary | ICD-10-CM | POA: Diagnosis not present

## 2018-02-10 DIAGNOSIS — Z992 Dependence on renal dialysis: Secondary | ICD-10-CM | POA: Diagnosis not present

## 2018-02-12 DIAGNOSIS — D509 Iron deficiency anemia, unspecified: Secondary | ICD-10-CM | POA: Diagnosis not present

## 2018-02-12 DIAGNOSIS — Z992 Dependence on renal dialysis: Secondary | ICD-10-CM | POA: Diagnosis not present

## 2018-02-12 DIAGNOSIS — D631 Anemia in chronic kidney disease: Secondary | ICD-10-CM | POA: Diagnosis not present

## 2018-02-12 DIAGNOSIS — N186 End stage renal disease: Secondary | ICD-10-CM | POA: Diagnosis not present

## 2018-02-12 DIAGNOSIS — N2581 Secondary hyperparathyroidism of renal origin: Secondary | ICD-10-CM | POA: Diagnosis not present

## 2018-02-15 DIAGNOSIS — D509 Iron deficiency anemia, unspecified: Secondary | ICD-10-CM | POA: Diagnosis not present

## 2018-02-15 DIAGNOSIS — Z992 Dependence on renal dialysis: Secondary | ICD-10-CM | POA: Diagnosis not present

## 2018-02-15 DIAGNOSIS — D631 Anemia in chronic kidney disease: Secondary | ICD-10-CM | POA: Diagnosis not present

## 2018-02-15 DIAGNOSIS — N186 End stage renal disease: Secondary | ICD-10-CM | POA: Diagnosis not present

## 2018-02-15 DIAGNOSIS — N2581 Secondary hyperparathyroidism of renal origin: Secondary | ICD-10-CM | POA: Diagnosis not present

## 2018-02-16 DIAGNOSIS — M170 Bilateral primary osteoarthritis of knee: Secondary | ICD-10-CM | POA: Diagnosis not present

## 2018-02-16 DIAGNOSIS — N185 Chronic kidney disease, stage 5: Secondary | ICD-10-CM | POA: Diagnosis not present

## 2018-02-16 DIAGNOSIS — I4891 Unspecified atrial fibrillation: Secondary | ICD-10-CM | POA: Diagnosis not present

## 2018-02-16 DIAGNOSIS — M25561 Pain in right knee: Secondary | ICD-10-CM | POA: Diagnosis not present

## 2018-02-16 DIAGNOSIS — E039 Hypothyroidism, unspecified: Secondary | ICD-10-CM | POA: Diagnosis not present

## 2018-02-16 DIAGNOSIS — Z789 Other specified health status: Secondary | ICD-10-CM | POA: Diagnosis not present

## 2018-02-16 DIAGNOSIS — Z299 Encounter for prophylactic measures, unspecified: Secondary | ICD-10-CM | POA: Diagnosis not present

## 2018-02-16 DIAGNOSIS — Z6827 Body mass index (BMI) 27.0-27.9, adult: Secondary | ICD-10-CM | POA: Diagnosis not present

## 2018-02-16 DIAGNOSIS — I10 Essential (primary) hypertension: Secondary | ICD-10-CM | POA: Diagnosis not present

## 2018-02-16 DIAGNOSIS — M25569 Pain in unspecified knee: Secondary | ICD-10-CM | POA: Diagnosis not present

## 2018-02-17 DIAGNOSIS — Z6827 Body mass index (BMI) 27.0-27.9, adult: Secondary | ICD-10-CM | POA: Diagnosis not present

## 2018-02-17 DIAGNOSIS — M1712 Unilateral primary osteoarthritis, left knee: Secondary | ICD-10-CM | POA: Diagnosis not present

## 2018-02-17 DIAGNOSIS — M170 Bilateral primary osteoarthritis of knee: Secondary | ICD-10-CM | POA: Diagnosis not present

## 2018-02-17 DIAGNOSIS — N2581 Secondary hyperparathyroidism of renal origin: Secondary | ICD-10-CM | POA: Diagnosis not present

## 2018-02-17 DIAGNOSIS — I10 Essential (primary) hypertension: Secondary | ICD-10-CM | POA: Diagnosis not present

## 2018-02-17 DIAGNOSIS — N186 End stage renal disease: Secondary | ICD-10-CM | POA: Diagnosis not present

## 2018-02-17 DIAGNOSIS — D509 Iron deficiency anemia, unspecified: Secondary | ICD-10-CM | POA: Diagnosis not present

## 2018-02-17 DIAGNOSIS — D631 Anemia in chronic kidney disease: Secondary | ICD-10-CM | POA: Diagnosis not present

## 2018-02-17 DIAGNOSIS — Z299 Encounter for prophylactic measures, unspecified: Secondary | ICD-10-CM | POA: Diagnosis not present

## 2018-02-17 DIAGNOSIS — Z789 Other specified health status: Secondary | ICD-10-CM | POA: Diagnosis not present

## 2018-02-17 DIAGNOSIS — M25569 Pain in unspecified knee: Secondary | ICD-10-CM | POA: Diagnosis not present

## 2018-02-17 DIAGNOSIS — Z992 Dependence on renal dialysis: Secondary | ICD-10-CM | POA: Diagnosis not present

## 2018-02-18 DIAGNOSIS — Z299 Encounter for prophylactic measures, unspecified: Secondary | ICD-10-CM | POA: Diagnosis not present

## 2018-02-18 DIAGNOSIS — Z6828 Body mass index (BMI) 28.0-28.9, adult: Secondary | ICD-10-CM | POA: Diagnosis not present

## 2018-02-18 DIAGNOSIS — N186 End stage renal disease: Secondary | ICD-10-CM | POA: Diagnosis not present

## 2018-02-18 DIAGNOSIS — Z992 Dependence on renal dialysis: Secondary | ICD-10-CM | POA: Diagnosis not present

## 2018-02-18 DIAGNOSIS — M170 Bilateral primary osteoarthritis of knee: Secondary | ICD-10-CM | POA: Diagnosis not present

## 2018-02-18 DIAGNOSIS — I4891 Unspecified atrial fibrillation: Secondary | ICD-10-CM | POA: Diagnosis not present

## 2018-02-18 DIAGNOSIS — M25569 Pain in unspecified knee: Secondary | ICD-10-CM | POA: Diagnosis not present

## 2018-02-18 DIAGNOSIS — M1711 Unilateral primary osteoarthritis, right knee: Secondary | ICD-10-CM | POA: Diagnosis not present

## 2018-02-19 DIAGNOSIS — Z992 Dependence on renal dialysis: Secondary | ICD-10-CM | POA: Diagnosis not present

## 2018-02-19 DIAGNOSIS — D509 Iron deficiency anemia, unspecified: Secondary | ICD-10-CM | POA: Diagnosis not present

## 2018-02-19 DIAGNOSIS — D631 Anemia in chronic kidney disease: Secondary | ICD-10-CM | POA: Diagnosis not present

## 2018-02-19 DIAGNOSIS — N186 End stage renal disease: Secondary | ICD-10-CM | POA: Diagnosis not present

## 2018-02-19 DIAGNOSIS — N2581 Secondary hyperparathyroidism of renal origin: Secondary | ICD-10-CM | POA: Diagnosis not present

## 2018-02-22 DIAGNOSIS — Z299 Encounter for prophylactic measures, unspecified: Secondary | ICD-10-CM | POA: Diagnosis not present

## 2018-02-22 DIAGNOSIS — I10 Essential (primary) hypertension: Secondary | ICD-10-CM | POA: Diagnosis not present

## 2018-02-22 DIAGNOSIS — R269 Unspecified abnormalities of gait and mobility: Secondary | ICD-10-CM | POA: Diagnosis not present

## 2018-02-22 DIAGNOSIS — D631 Anemia in chronic kidney disease: Secondary | ICD-10-CM | POA: Diagnosis not present

## 2018-02-22 DIAGNOSIS — M1711 Unilateral primary osteoarthritis, right knee: Secondary | ICD-10-CM | POA: Diagnosis not present

## 2018-02-22 DIAGNOSIS — I4891 Unspecified atrial fibrillation: Secondary | ICD-10-CM | POA: Diagnosis not present

## 2018-02-22 DIAGNOSIS — N2581 Secondary hyperparathyroidism of renal origin: Secondary | ICD-10-CM | POA: Diagnosis not present

## 2018-02-22 DIAGNOSIS — R262 Difficulty in walking, not elsewhere classified: Secondary | ICD-10-CM | POA: Diagnosis not present

## 2018-02-22 DIAGNOSIS — Z6828 Body mass index (BMI) 28.0-28.9, adult: Secondary | ICD-10-CM | POA: Diagnosis not present

## 2018-02-22 DIAGNOSIS — D509 Iron deficiency anemia, unspecified: Secondary | ICD-10-CM | POA: Diagnosis not present

## 2018-02-22 DIAGNOSIS — N186 End stage renal disease: Secondary | ICD-10-CM | POA: Diagnosis not present

## 2018-02-22 DIAGNOSIS — Z992 Dependence on renal dialysis: Secondary | ICD-10-CM | POA: Diagnosis not present

## 2018-02-22 DIAGNOSIS — M2560 Stiffness of unspecified joint, not elsewhere classified: Secondary | ICD-10-CM | POA: Diagnosis not present

## 2018-02-24 DIAGNOSIS — N186 End stage renal disease: Secondary | ICD-10-CM | POA: Diagnosis not present

## 2018-02-24 DIAGNOSIS — Z299 Encounter for prophylactic measures, unspecified: Secondary | ICD-10-CM | POA: Diagnosis not present

## 2018-02-24 DIAGNOSIS — D509 Iron deficiency anemia, unspecified: Secondary | ICD-10-CM | POA: Diagnosis not present

## 2018-02-24 DIAGNOSIS — D631 Anemia in chronic kidney disease: Secondary | ICD-10-CM | POA: Diagnosis not present

## 2018-02-24 DIAGNOSIS — Z6828 Body mass index (BMI) 28.0-28.9, adult: Secondary | ICD-10-CM | POA: Diagnosis not present

## 2018-02-24 DIAGNOSIS — I4891 Unspecified atrial fibrillation: Secondary | ICD-10-CM | POA: Diagnosis not present

## 2018-02-24 DIAGNOSIS — N185 Chronic kidney disease, stage 5: Secondary | ICD-10-CM | POA: Diagnosis not present

## 2018-02-24 DIAGNOSIS — I10 Essential (primary) hypertension: Secondary | ICD-10-CM | POA: Diagnosis not present

## 2018-02-24 DIAGNOSIS — M1712 Unilateral primary osteoarthritis, left knee: Secondary | ICD-10-CM | POA: Diagnosis not present

## 2018-02-24 DIAGNOSIS — E039 Hypothyroidism, unspecified: Secondary | ICD-10-CM | POA: Diagnosis not present

## 2018-02-24 DIAGNOSIS — N2581 Secondary hyperparathyroidism of renal origin: Secondary | ICD-10-CM | POA: Diagnosis not present

## 2018-02-24 DIAGNOSIS — Z992 Dependence on renal dialysis: Secondary | ICD-10-CM | POA: Diagnosis not present

## 2018-02-26 DIAGNOSIS — D631 Anemia in chronic kidney disease: Secondary | ICD-10-CM | POA: Diagnosis not present

## 2018-02-26 DIAGNOSIS — Z992 Dependence on renal dialysis: Secondary | ICD-10-CM | POA: Diagnosis not present

## 2018-02-26 DIAGNOSIS — D509 Iron deficiency anemia, unspecified: Secondary | ICD-10-CM | POA: Diagnosis not present

## 2018-02-26 DIAGNOSIS — N186 End stage renal disease: Secondary | ICD-10-CM | POA: Diagnosis not present

## 2018-02-26 DIAGNOSIS — N2581 Secondary hyperparathyroidism of renal origin: Secondary | ICD-10-CM | POA: Diagnosis not present

## 2018-03-01 DIAGNOSIS — Z6828 Body mass index (BMI) 28.0-28.9, adult: Secondary | ICD-10-CM | POA: Diagnosis not present

## 2018-03-01 DIAGNOSIS — N2581 Secondary hyperparathyroidism of renal origin: Secondary | ICD-10-CM | POA: Diagnosis not present

## 2018-03-01 DIAGNOSIS — Z992 Dependence on renal dialysis: Secondary | ICD-10-CM | POA: Diagnosis not present

## 2018-03-01 DIAGNOSIS — D631 Anemia in chronic kidney disease: Secondary | ICD-10-CM | POA: Diagnosis not present

## 2018-03-01 DIAGNOSIS — I10 Essential (primary) hypertension: Secondary | ICD-10-CM | POA: Diagnosis not present

## 2018-03-01 DIAGNOSIS — M1711 Unilateral primary osteoarthritis, right knee: Secondary | ICD-10-CM | POA: Diagnosis not present

## 2018-03-01 DIAGNOSIS — N186 End stage renal disease: Secondary | ICD-10-CM | POA: Diagnosis not present

## 2018-03-01 DIAGNOSIS — D509 Iron deficiency anemia, unspecified: Secondary | ICD-10-CM | POA: Diagnosis not present

## 2018-03-01 DIAGNOSIS — Z299 Encounter for prophylactic measures, unspecified: Secondary | ICD-10-CM | POA: Diagnosis not present

## 2018-03-03 DIAGNOSIS — Z992 Dependence on renal dialysis: Secondary | ICD-10-CM | POA: Diagnosis not present

## 2018-03-03 DIAGNOSIS — Z299 Encounter for prophylactic measures, unspecified: Secondary | ICD-10-CM | POA: Diagnosis not present

## 2018-03-03 DIAGNOSIS — I10 Essential (primary) hypertension: Secondary | ICD-10-CM | POA: Diagnosis not present

## 2018-03-03 DIAGNOSIS — D509 Iron deficiency anemia, unspecified: Secondary | ICD-10-CM | POA: Diagnosis not present

## 2018-03-03 DIAGNOSIS — N185 Chronic kidney disease, stage 5: Secondary | ICD-10-CM | POA: Diagnosis not present

## 2018-03-03 DIAGNOSIS — D631 Anemia in chronic kidney disease: Secondary | ICD-10-CM | POA: Diagnosis not present

## 2018-03-03 DIAGNOSIS — Z6828 Body mass index (BMI) 28.0-28.9, adult: Secondary | ICD-10-CM | POA: Diagnosis not present

## 2018-03-03 DIAGNOSIS — N2581 Secondary hyperparathyroidism of renal origin: Secondary | ICD-10-CM | POA: Diagnosis not present

## 2018-03-03 DIAGNOSIS — I4891 Unspecified atrial fibrillation: Secondary | ICD-10-CM | POA: Diagnosis not present

## 2018-03-03 DIAGNOSIS — N186 End stage renal disease: Secondary | ICD-10-CM | POA: Diagnosis not present

## 2018-03-03 DIAGNOSIS — I770 Arteriovenous fistula, acquired: Secondary | ICD-10-CM | POA: Diagnosis not present

## 2018-03-03 DIAGNOSIS — M1712 Unilateral primary osteoarthritis, left knee: Secondary | ICD-10-CM | POA: Diagnosis not present

## 2018-03-05 DIAGNOSIS — N186 End stage renal disease: Secondary | ICD-10-CM | POA: Diagnosis not present

## 2018-03-05 DIAGNOSIS — D631 Anemia in chronic kidney disease: Secondary | ICD-10-CM | POA: Diagnosis not present

## 2018-03-05 DIAGNOSIS — N2581 Secondary hyperparathyroidism of renal origin: Secondary | ICD-10-CM | POA: Diagnosis not present

## 2018-03-05 DIAGNOSIS — Z992 Dependence on renal dialysis: Secondary | ICD-10-CM | POA: Diagnosis not present

## 2018-03-05 DIAGNOSIS — D509 Iron deficiency anemia, unspecified: Secondary | ICD-10-CM | POA: Diagnosis not present

## 2018-03-08 DIAGNOSIS — N2581 Secondary hyperparathyroidism of renal origin: Secondary | ICD-10-CM | POA: Diagnosis not present

## 2018-03-08 DIAGNOSIS — N186 End stage renal disease: Secondary | ICD-10-CM | POA: Diagnosis not present

## 2018-03-08 DIAGNOSIS — Z6828 Body mass index (BMI) 28.0-28.9, adult: Secondary | ICD-10-CM | POA: Diagnosis not present

## 2018-03-08 DIAGNOSIS — I10 Essential (primary) hypertension: Secondary | ICD-10-CM | POA: Diagnosis not present

## 2018-03-08 DIAGNOSIS — N185 Chronic kidney disease, stage 5: Secondary | ICD-10-CM | POA: Diagnosis not present

## 2018-03-08 DIAGNOSIS — I4891 Unspecified atrial fibrillation: Secondary | ICD-10-CM | POA: Diagnosis not present

## 2018-03-08 DIAGNOSIS — Z992 Dependence on renal dialysis: Secondary | ICD-10-CM | POA: Diagnosis not present

## 2018-03-08 DIAGNOSIS — D509 Iron deficiency anemia, unspecified: Secondary | ICD-10-CM | POA: Diagnosis not present

## 2018-03-08 DIAGNOSIS — Z299 Encounter for prophylactic measures, unspecified: Secondary | ICD-10-CM | POA: Diagnosis not present

## 2018-03-08 DIAGNOSIS — M461 Sacroiliitis, not elsewhere classified: Secondary | ICD-10-CM | POA: Diagnosis not present

## 2018-03-08 DIAGNOSIS — M1711 Unilateral primary osteoarthritis, right knee: Secondary | ICD-10-CM | POA: Diagnosis not present

## 2018-03-08 DIAGNOSIS — D631 Anemia in chronic kidney disease: Secondary | ICD-10-CM | POA: Diagnosis not present

## 2018-03-08 DIAGNOSIS — I770 Arteriovenous fistula, acquired: Secondary | ICD-10-CM | POA: Diagnosis not present

## 2018-03-09 ENCOUNTER — Ambulatory Visit (INDEPENDENT_AMBULATORY_CARE_PROVIDER_SITE_OTHER): Payer: Medicare Other | Admitting: Cardiology

## 2018-03-09 ENCOUNTER — Encounter: Payer: Self-pay | Admitting: Cardiology

## 2018-03-09 VITALS — BP 98/60 | HR 108 | Ht 69.0 in | Wt 165.0 lb

## 2018-03-09 DIAGNOSIS — N186 End stage renal disease: Secondary | ICD-10-CM | POA: Diagnosis not present

## 2018-03-09 DIAGNOSIS — Z992 Dependence on renal dialysis: Secondary | ICD-10-CM | POA: Diagnosis not present

## 2018-03-09 DIAGNOSIS — I429 Cardiomyopathy, unspecified: Secondary | ICD-10-CM

## 2018-03-09 DIAGNOSIS — I4819 Other persistent atrial fibrillation: Secondary | ICD-10-CM

## 2018-03-09 NOTE — Progress Notes (Signed)
Cardiology Office Note  Date: 03/09/2018   ID: Nieko, Clarin [birth year], MRN [id number]  PCP: Monico Blitz, MD  Primary Cardiologist: Rozann Lesches, MD   Chief Complaint  Patient presents with  . Atrial Fibrillation    History of Present Illness: Dale Torres is a 85 y.o. male last seen in October 2019.  He is here today with his son for a follow-up visit.  He does not report any chest pain or palpitations, continues to undergo regular hemodialysis sessions on the direction of Dr. Lowanda Foster.  Relatively low blood pressure remains an issue when he is on Proamatine.  He sees Dr. Rayann Heman in the device clinic, Medtronic pacemaker in place.  I personally reviewed his ECG today which shows atrial fibrillation with left bundle branch block.  I checked his blood pressure personally today with manual cuff.  We went over his medications and will plan to continue amiodarone at this time mainly to help with heart rate control since we are somewhat limited in further up titrating beta-blocker in the setting of low blood pressures.  Past Medical History:  Diagnosis Date  . Anemia    Dr. Sonny Dandy  . Arthritis   . Benign prostatic hypertrophy   . Carpal tunnel syndrome of left wrist   . Coronary atherosclerosis of native coronary artery    Nonobstructive 2009  . ESRD on hemodialysis ([place])   . Essential hypertension, benign   . GERD (gastroesophageal reflux disease)   . Headache   . History of pneumonia   . HOH (hard of hearing)    Bilateral  hearing aids  . Hypothyroidism   . Persistent atrial fibrillation    Declines coumadin  . Presence of permanent cardiac pacemaker   . Sick sinus syndrome [facility])    Status post pacemaker placement 2008  . Skin cancer, basal cell     Past Surgical History:  Procedure Laterality Date  . A/V FISTULAGRAM N/A 01/06/2017   Procedure: A/V FISTULAGRAM;  Surgeon: Waynetta Sandy, MD;  Location: [place] CV LAB;  Service: Cardiovascular;   Laterality: N/A;  . APPENDECTOMY    . AV FISTULA PLACEMENT Left 08/16/2014   Procedure: Left Arm creation of Brachiocephalic ARTERIOVENOUS (AV) FISTULA ;  Surgeon: Angelia Mould, MD;  Location: [place];  Service: Vascular;  Laterality: Left;  . AV FISTULA PLACEMENT Right 06/24/2016   Procedure: CREATION OF RIGHT RADIOCEPHALIC ARTERIOVENOUS (AV);  Surgeon: Rosetta Posner, MD;  Location: [place];  Service: Vascular;  Laterality: Right;  . CHOLECYSTECTOMY    . COLONOSCOPY W/ BIOPSIES AND POLYPECTOMY    . EYE SURGERY    . INSERTION OF DIALYSIS CATHETER Left 06/24/2016   Procedure: INSERTION OF DIALYSIS CATHETER - left Internal Jugular placement;  Surgeon: Rosetta Posner, MD;  Location: [place];  Service: Vascular;  Laterality: Left;  . IR FLUORO GUIDE CV LINE RIGHT  08/06/2016  . LAMINECTOMY    . LIGATION OF ARTERIOVENOUS  FISTULA Left 06/24/2016   Procedure: LIGATION OF left arm  ARTERIOVENOUS  FISTULA;  Surgeon: Rosetta Posner, MD;  Location: [place];  Service: Vascular;  Laterality: Left;  . PACEMAKER GENERATOR CHANGE  03/03/11   MDT Adaptal L implanted by Dr Rayann Heman  . PACEMAKER PLACEMENT  2008   [employer]onic - Dr. Doreatha Lew  . PERIPHERAL VASCULAR CATHETERIZATION Left 03/06/2015   Procedure: Fistulagram;  Surgeon: Serafina Mitchell, MD;  Location: [place] CV LAB;  Service: Cardiovascular;  Laterality: Left;  . PERMANENT PACEMAKER  GENERATOR CHANGE N/A 03/03/2011   Procedure: PERMANENT PACEMAKER GENERATOR CHANGE;  Surgeon: Thompson Grayer, MD;  Location: [facility] CATH LAB;  Service: Cardiovascular;  Laterality: N/A;  . Right rotator cuff repair    . THROMBECTOMY AND REVISION OF ARTERIOVENTOUS (AV) GORETEX  GRAFT Left 03/11/2015   Procedure: THROMBECTOMY AND REVISION OF ARTERIOVENTOUS (AV) GORETEX  GRAFT LEFT ARM;  Surgeon: Rosetta Posner, MD;  Location: [place];  Service: Vascular;  Laterality: Left;  . TONSILLECTOMY      Current Outpatient Medications  Medication Sig Dispense Refill  . amiodarone (PACERONE) 200 MG  tablet Take 200 mg by mouth daily.    Marland Kitchen aspirin EC 81 MG tablet Take 81 mg by mouth at bedtime.    Lorin Picket 1 GM 210 MG(Fe) tablet Place 1 tablet inside cheek 2 (two) times daily.    . carvedilol (COREG) 3.125 MG tablet Take 1 tablet (3.125 mg total) by mouth 2 (two) times daily. Except, do not take the morning dose on your dialysis days 180 tablet 1  . docusate sodium (COLACE) 100 MG capsule Take 100 mg by mouth daily.     . folic acid (FOLVITE) 1 MG tablet Take 1 mg by mouth daily.      Marland Kitchen gabapentin (NEURONTIN) 400 MG capsule Take 400 mg by mouth at bedtime.     Marland Kitchen HYDROcodone-acetaminophen (NORCO) 10-325 MG tablet Take 1 tablet by mouth every 6 (six) hours as needed for severe pain. (Patient taking differently: Take 1 tablet by mouth 3 (three) times daily. ) 15 tablet 0  . levothyroxine (SYNTHROID, LEVOTHROID) 125 MCG tablet Take 125 mcg by mouth daily at 2 PM. In the afternoon.    . Melatonin 5 MG TABS Take 5 mg by mouth at bedtime.    . midodrine (PROAMATINE) 10 MG tablet Take 10 mg by mouth 2 (two) times daily.     . naproxen sodium (ALEVE) 220 MG tablet Take 440 mg by mouth daily as needed (for pain.).    Marland Kitchen nitroGLYCERIN (NITROSTAT) 0.4 MG SL tablet Place 1 tablet (0.4 mg total) under the tongue every 5 (five) minutes as needed for chest pain (up to 3 doses. If no relief after 3rd dose, proceed to the ED for an evaluation or call 911). 25 tablet 3  . omeprazole (PRILOSEC) 20 MG capsule Take 20 mg by mouth at bedtime.     . tamsulosin (FLOMAX) 0.4 MG CAPS capsule Take 0.4 mg by mouth at bedtime.     No current facility-administered medications for this visit.    Allergies:  Oxycodone hcl   Social History: The patient  reports that he quit smoking about 58 years ago. His smoking use included cigarettes. He started smoking about 70 years ago. He has a 9.60 pack-year smoking history. He quit smokeless tobacco use about 38 years ago.  His smokeless tobacco use included chew. He reports current  alcohol use. He reports that he does not use drugs.   ROS:  Please see the history of present illness. Otherwise, complete review of systems is positive for hearing loss.  All other systems are reviewed and negative.   Physical Exam: VS:  BP 98/60   Pulse (!) 108   Ht _0  (1.753 m)   Wt 165 lb (74.8 kg)   SpO2 99%   BMI 24.37 kg/m , BMI Body mass index is 24.37 kg/m.  Wt Readings from Last 3 Encounters:  03/09/18 165 lb (74.8 kg)  11/03/17 163 lb 6.4 oz (74.1  kg)  07/26/17 173 lb 6.4 oz (78.7 kg)    General: Elderly male using rolling walker, appears comfortable at rest. HEENT: Conjunctiva and lids normal, oropharynx clear. Neck: Supple, no elevated JVP or carotid bruits, no thyromegaly. Lungs: Clear to auscultation, nonlabored breathing at rest. Thorax: Dialysis catheter left chest is dressed. Cardiac: Irregularly irregular, no S3, soft systolic murmur. Abdomen: Soft, nontender, bowel sounds present. Extremities: Chronic appearing lower leg edema, distal pulses 2+. Skin: Warm and dry. Musculoskeletal: No kyphosis. Neuropsychiatric: Alert and oriented x3, affect grossly appropriate.  Hearing loss evident.  ECG: I personally reviewed the tracing from 03/22/2017 which showed an atrial paced rhythm with left bundle branch block.  Recent Labwork:  01/06/2017: BUN 31; Creatinine, Ser 4.30; Hemoglobin 9.9; Potassium 4.6; Sodium 135   Other Studies Reviewed Today:  Echocardiogram 06/06/2017 [facility]): LVEF 25 to 30%, MAC with moderate mitral regurgitation, moderate left atrial enlargement, right ventricular dilatation with decreased contraction, mild to moderate tricuspid regurgitation, mild right atrial enlargement.  Assessment and Plan:  1.  Persistent atrial fibrillation.  He declines anticoagulation.  We will plan to continue with current regimen including amiodarone and low-dose beta-blocker, heart rate control efforts are somewhat limited by relatively low  blood pressure.  2.  End-stage renal disease on hemodialysis.  Follows with Dr. Lowanda Foster.  3.  Secondary cardiomyopathy with LVEF 25 to 30% range.  Continue conservative management.  Not candidate for ARB or other afterload agents due to low blood pressures.  Current medicines were reviewed with the patient today.   Orders Placed This Encounter  Procedures  . EKG 12-Lead    Disposition: Follow-up in 6 months.  Signed, Satira Sark, MD, [facility] 03/09/2018 1:59 PM    [place] [facility] [address], [place], [place] [address] Phone: [phone]; Fax: [phone]

## 2018-03-09 NOTE — Patient Instructions (Addendum)

## 2018-03-10 DIAGNOSIS — D509 Iron deficiency anemia, unspecified: Secondary | ICD-10-CM | POA: Diagnosis not present

## 2018-03-10 DIAGNOSIS — Z992 Dependence on renal dialysis: Secondary | ICD-10-CM | POA: Diagnosis not present

## 2018-03-10 DIAGNOSIS — Z6828 Body mass index (BMI) 28.0-28.9, adult: Secondary | ICD-10-CM | POA: Diagnosis not present

## 2018-03-10 DIAGNOSIS — I10 Essential (primary) hypertension: Secondary | ICD-10-CM | POA: Diagnosis not present

## 2018-03-10 DIAGNOSIS — M1712 Unilateral primary osteoarthritis, left knee: Secondary | ICD-10-CM | POA: Diagnosis not present

## 2018-03-10 DIAGNOSIS — D631 Anemia in chronic kidney disease: Secondary | ICD-10-CM | POA: Diagnosis not present

## 2018-03-10 DIAGNOSIS — Z299 Encounter for prophylactic measures, unspecified: Secondary | ICD-10-CM | POA: Diagnosis not present

## 2018-03-10 DIAGNOSIS — N186 End stage renal disease: Secondary | ICD-10-CM | POA: Diagnosis not present

## 2018-03-10 DIAGNOSIS — N2581 Secondary hyperparathyroidism of renal origin: Secondary | ICD-10-CM | POA: Diagnosis not present

## 2018-03-12 DIAGNOSIS — N2581 Secondary hyperparathyroidism of renal origin: Secondary | ICD-10-CM | POA: Diagnosis not present

## 2018-03-12 DIAGNOSIS — D631 Anemia in chronic kidney disease: Secondary | ICD-10-CM | POA: Diagnosis not present

## 2018-03-12 DIAGNOSIS — Z992 Dependence on renal dialysis: Secondary | ICD-10-CM | POA: Diagnosis not present

## 2018-03-12 DIAGNOSIS — D509 Iron deficiency anemia, unspecified: Secondary | ICD-10-CM | POA: Diagnosis not present

## 2018-03-12 DIAGNOSIS — N186 End stage renal disease: Secondary | ICD-10-CM | POA: Diagnosis not present

## 2018-03-15 DIAGNOSIS — M25561 Pain in right knee: Secondary | ICD-10-CM | POA: Diagnosis not present

## 2018-03-15 DIAGNOSIS — Z299 Encounter for prophylactic measures, unspecified: Secondary | ICD-10-CM | POA: Diagnosis not present

## 2018-03-15 DIAGNOSIS — D631 Anemia in chronic kidney disease: Secondary | ICD-10-CM | POA: Diagnosis not present

## 2018-03-15 DIAGNOSIS — Z992 Dependence on renal dialysis: Secondary | ICD-10-CM | POA: Diagnosis not present

## 2018-03-15 DIAGNOSIS — I10 Essential (primary) hypertension: Secondary | ICD-10-CM | POA: Diagnosis not present

## 2018-03-15 DIAGNOSIS — M170 Bilateral primary osteoarthritis of knee: Secondary | ICD-10-CM | POA: Diagnosis not present

## 2018-03-15 DIAGNOSIS — D509 Iron deficiency anemia, unspecified: Secondary | ICD-10-CM | POA: Diagnosis not present

## 2018-03-15 DIAGNOSIS — N2581 Secondary hyperparathyroidism of renal origin: Secondary | ICD-10-CM | POA: Diagnosis not present

## 2018-03-15 DIAGNOSIS — N186 End stage renal disease: Secondary | ICD-10-CM | POA: Diagnosis not present

## 2018-03-15 DIAGNOSIS — Z6828 Body mass index (BMI) 28.0-28.9, adult: Secondary | ICD-10-CM | POA: Diagnosis not present

## 2018-03-17 DIAGNOSIS — D631 Anemia in chronic kidney disease: Secondary | ICD-10-CM | POA: Diagnosis not present

## 2018-03-17 DIAGNOSIS — Z299 Encounter for prophylactic measures, unspecified: Secondary | ICD-10-CM | POA: Diagnosis not present

## 2018-03-17 DIAGNOSIS — Z992 Dependence on renal dialysis: Secondary | ICD-10-CM | POA: Diagnosis not present

## 2018-03-17 DIAGNOSIS — I10 Essential (primary) hypertension: Secondary | ICD-10-CM | POA: Diagnosis not present

## 2018-03-17 DIAGNOSIS — N186 End stage renal disease: Secondary | ICD-10-CM | POA: Diagnosis not present

## 2018-03-17 DIAGNOSIS — D509 Iron deficiency anemia, unspecified: Secondary | ICD-10-CM | POA: Diagnosis not present

## 2018-03-17 DIAGNOSIS — Z6828 Body mass index (BMI) 28.0-28.9, adult: Secondary | ICD-10-CM | POA: Diagnosis not present

## 2018-03-17 DIAGNOSIS — M1712 Unilateral primary osteoarthritis, left knee: Secondary | ICD-10-CM | POA: Diagnosis not present

## 2018-03-17 DIAGNOSIS — M461 Sacroiliitis, not elsewhere classified: Secondary | ICD-10-CM | POA: Diagnosis not present

## 2018-03-17 DIAGNOSIS — M170 Bilateral primary osteoarthritis of knee: Secondary | ICD-10-CM | POA: Diagnosis not present

## 2018-03-17 DIAGNOSIS — N2581 Secondary hyperparathyroidism of renal origin: Secondary | ICD-10-CM | POA: Diagnosis not present

## 2018-03-19 DIAGNOSIS — D631 Anemia in chronic kidney disease: Secondary | ICD-10-CM | POA: Diagnosis not present

## 2018-03-19 DIAGNOSIS — N2581 Secondary hyperparathyroidism of renal origin: Secondary | ICD-10-CM | POA: Diagnosis not present

## 2018-03-19 DIAGNOSIS — N186 End stage renal disease: Secondary | ICD-10-CM | POA: Diagnosis not present

## 2018-03-19 DIAGNOSIS — D509 Iron deficiency anemia, unspecified: Secondary | ICD-10-CM | POA: Diagnosis not present

## 2018-03-19 DIAGNOSIS — Z992 Dependence on renal dialysis: Secondary | ICD-10-CM | POA: Diagnosis not present

## 2018-03-19 DIAGNOSIS — N183 Chronic kidney disease, stage 3 (moderate): Secondary | ICD-10-CM | POA: Diagnosis not present

## 2018-03-22 ENCOUNTER — Encounter: Payer: Medicare Other | Admitting: *Deleted

## 2018-03-22 DIAGNOSIS — M1711 Unilateral primary osteoarthritis, right knee: Secondary | ICD-10-CM | POA: Diagnosis not present

## 2018-03-22 DIAGNOSIS — M170 Bilateral primary osteoarthritis of knee: Secondary | ICD-10-CM | POA: Diagnosis not present

## 2018-03-22 DIAGNOSIS — N186 End stage renal disease: Secondary | ICD-10-CM | POA: Diagnosis not present

## 2018-03-22 DIAGNOSIS — D631 Anemia in chronic kidney disease: Secondary | ICD-10-CM | POA: Diagnosis not present

## 2018-03-22 DIAGNOSIS — N185 Chronic kidney disease, stage 5: Secondary | ICD-10-CM | POA: Diagnosis not present

## 2018-03-22 DIAGNOSIS — M461 Sacroiliitis, not elsewhere classified: Secondary | ICD-10-CM | POA: Diagnosis not present

## 2018-03-22 DIAGNOSIS — N2581 Secondary hyperparathyroidism of renal origin: Secondary | ICD-10-CM | POA: Diagnosis not present

## 2018-03-22 DIAGNOSIS — I10 Essential (primary) hypertension: Secondary | ICD-10-CM | POA: Diagnosis not present

## 2018-03-22 DIAGNOSIS — I4891 Unspecified atrial fibrillation: Secondary | ICD-10-CM | POA: Diagnosis not present

## 2018-03-22 DIAGNOSIS — D509 Iron deficiency anemia, unspecified: Secondary | ICD-10-CM | POA: Diagnosis not present

## 2018-03-22 DIAGNOSIS — Z6828 Body mass index (BMI) 28.0-28.9, adult: Secondary | ICD-10-CM | POA: Diagnosis not present

## 2018-03-22 DIAGNOSIS — Z992 Dependence on renal dialysis: Secondary | ICD-10-CM | POA: Diagnosis not present

## 2018-03-22 DIAGNOSIS — Z299 Encounter for prophylactic measures, unspecified: Secondary | ICD-10-CM | POA: Diagnosis not present

## 2018-03-23 ENCOUNTER — Telehealth: Payer: Self-pay

## 2018-03-23 NOTE — Telephone Encounter (Signed)
Left message for patient to remind of missed remote transmission.  

## 2018-03-24 DIAGNOSIS — D509 Iron deficiency anemia, unspecified: Secondary | ICD-10-CM | POA: Diagnosis not present

## 2018-03-24 DIAGNOSIS — Z992 Dependence on renal dialysis: Secondary | ICD-10-CM | POA: Diagnosis not present

## 2018-03-24 DIAGNOSIS — D631 Anemia in chronic kidney disease: Secondary | ICD-10-CM | POA: Diagnosis not present

## 2018-03-24 DIAGNOSIS — N2581 Secondary hyperparathyroidism of renal origin: Secondary | ICD-10-CM | POA: Diagnosis not present

## 2018-03-24 DIAGNOSIS — Z6828 Body mass index (BMI) 28.0-28.9, adult: Secondary | ICD-10-CM | POA: Diagnosis not present

## 2018-03-24 DIAGNOSIS — I10 Essential (primary) hypertension: Secondary | ICD-10-CM | POA: Diagnosis not present

## 2018-03-24 DIAGNOSIS — M1712 Unilateral primary osteoarthritis, left knee: Secondary | ICD-10-CM | POA: Diagnosis not present

## 2018-03-24 DIAGNOSIS — E039 Hypothyroidism, unspecified: Secondary | ICD-10-CM | POA: Diagnosis not present

## 2018-03-24 DIAGNOSIS — N186 End stage renal disease: Secondary | ICD-10-CM | POA: Diagnosis not present

## 2018-03-24 DIAGNOSIS — I770 Arteriovenous fistula, acquired: Secondary | ICD-10-CM | POA: Diagnosis not present

## 2018-03-24 DIAGNOSIS — Z299 Encounter for prophylactic measures, unspecified: Secondary | ICD-10-CM | POA: Diagnosis not present

## 2018-03-26 DIAGNOSIS — D509 Iron deficiency anemia, unspecified: Secondary | ICD-10-CM | POA: Diagnosis not present

## 2018-03-26 DIAGNOSIS — N186 End stage renal disease: Secondary | ICD-10-CM | POA: Diagnosis not present

## 2018-03-26 DIAGNOSIS — Z992 Dependence on renal dialysis: Secondary | ICD-10-CM | POA: Diagnosis not present

## 2018-03-26 DIAGNOSIS — N2581 Secondary hyperparathyroidism of renal origin: Secondary | ICD-10-CM | POA: Diagnosis not present

## 2018-03-26 DIAGNOSIS — D631 Anemia in chronic kidney disease: Secondary | ICD-10-CM | POA: Diagnosis not present

## 2018-03-29 DIAGNOSIS — D631 Anemia in chronic kidney disease: Secondary | ICD-10-CM | POA: Diagnosis not present

## 2018-03-29 DIAGNOSIS — N186 End stage renal disease: Secondary | ICD-10-CM | POA: Diagnosis not present

## 2018-03-29 DIAGNOSIS — N2581 Secondary hyperparathyroidism of renal origin: Secondary | ICD-10-CM | POA: Diagnosis not present

## 2018-03-29 DIAGNOSIS — Z992 Dependence on renal dialysis: Secondary | ICD-10-CM | POA: Diagnosis not present

## 2018-03-29 DIAGNOSIS — D509 Iron deficiency anemia, unspecified: Secondary | ICD-10-CM | POA: Diagnosis not present

## 2018-03-30 ENCOUNTER — Encounter: Payer: Self-pay | Admitting: Cardiology

## 2018-03-31 DIAGNOSIS — N186 End stage renal disease: Secondary | ICD-10-CM | POA: Diagnosis not present

## 2018-03-31 DIAGNOSIS — D631 Anemia in chronic kidney disease: Secondary | ICD-10-CM | POA: Diagnosis not present

## 2018-03-31 DIAGNOSIS — Z992 Dependence on renal dialysis: Secondary | ICD-10-CM | POA: Diagnosis not present

## 2018-03-31 DIAGNOSIS — N2581 Secondary hyperparathyroidism of renal origin: Secondary | ICD-10-CM | POA: Diagnosis not present

## 2018-03-31 DIAGNOSIS — D509 Iron deficiency anemia, unspecified: Secondary | ICD-10-CM | POA: Diagnosis not present

## 2018-04-02 DIAGNOSIS — N186 End stage renal disease: Secondary | ICD-10-CM | POA: Diagnosis not present

## 2018-04-02 DIAGNOSIS — D631 Anemia in chronic kidney disease: Secondary | ICD-10-CM | POA: Diagnosis not present

## 2018-04-02 DIAGNOSIS — D509 Iron deficiency anemia, unspecified: Secondary | ICD-10-CM | POA: Diagnosis not present

## 2018-04-02 DIAGNOSIS — N2581 Secondary hyperparathyroidism of renal origin: Secondary | ICD-10-CM | POA: Diagnosis not present

## 2018-04-02 DIAGNOSIS — Z992 Dependence on renal dialysis: Secondary | ICD-10-CM | POA: Diagnosis not present

## 2018-04-05 DIAGNOSIS — D631 Anemia in chronic kidney disease: Secondary | ICD-10-CM | POA: Diagnosis not present

## 2018-04-05 DIAGNOSIS — N186 End stage renal disease: Secondary | ICD-10-CM | POA: Diagnosis not present

## 2018-04-05 DIAGNOSIS — Z992 Dependence on renal dialysis: Secondary | ICD-10-CM | POA: Diagnosis not present

## 2018-04-05 DIAGNOSIS — N2581 Secondary hyperparathyroidism of renal origin: Secondary | ICD-10-CM | POA: Diagnosis not present

## 2018-04-05 DIAGNOSIS — D509 Iron deficiency anemia, unspecified: Secondary | ICD-10-CM | POA: Diagnosis not present

## 2018-04-07 ENCOUNTER — Ambulatory Visit (INDEPENDENT_AMBULATORY_CARE_PROVIDER_SITE_OTHER): Payer: Medicare Other | Admitting: *Deleted

## 2018-04-07 DIAGNOSIS — D631 Anemia in chronic kidney disease: Secondary | ICD-10-CM | POA: Diagnosis not present

## 2018-04-07 DIAGNOSIS — N186 End stage renal disease: Secondary | ICD-10-CM | POA: Diagnosis not present

## 2018-04-07 DIAGNOSIS — N2581 Secondary hyperparathyroidism of renal origin: Secondary | ICD-10-CM | POA: Diagnosis not present

## 2018-04-07 DIAGNOSIS — Z992 Dependence on renal dialysis: Secondary | ICD-10-CM | POA: Diagnosis not present

## 2018-04-07 DIAGNOSIS — I4819 Other persistent atrial fibrillation: Secondary | ICD-10-CM | POA: Diagnosis not present

## 2018-04-07 DIAGNOSIS — D509 Iron deficiency anemia, unspecified: Secondary | ICD-10-CM | POA: Diagnosis not present

## 2018-04-07 DIAGNOSIS — I495 Sick sinus syndrome: Secondary | ICD-10-CM

## 2018-04-08 ENCOUNTER — Other Ambulatory Visit: Payer: Self-pay

## 2018-04-08 LAB — CUP PACEART REMOTE DEVICE CHECK
Battery Remaining Longevity: 98 mo
Battery Voltage: 2.78 V
Brady Statistic AP VP Percent: 1 %
Brady Statistic AP VS Percent: 0 %
Brady Statistic AS VP Percent: 0 %
Implantable Lead Implant Date: 20080604
Implantable Lead Model: 4469
Implantable Lead Model: 4470
Implantable Lead Serial Number: 601713
Implantable Pulse Generator Implant Date: 20130212
Lead Channel Impedance Value: 436 Ohm
Lead Channel Pacing Threshold Amplitude: 0.625 V
Lead Channel Pacing Threshold Pulse Width: 0.4 ms
Lead Channel Setting Pacing Amplitude: 2 V
Lead Channel Setting Pacing Amplitude: 2.5 V
Lead Channel Setting Pacing Pulse Width: 0.4 ms
MDC IDC LEAD IMPLANT DT: 20080604
MDC IDC LEAD LOCATION: 753859
MDC IDC LEAD LOCATION: 753860
MDC IDC LEAD SERIAL: 498205
MDC IDC MSMT BATTERY IMPEDANCE: 382 Ohm
MDC IDC MSMT LEADCHNL RV IMPEDANCE VALUE: 443 Ohm
MDC IDC MSMT LEADCHNL RV PACING THRESHOLD AMPLITUDE: 0.5 V
MDC IDC MSMT LEADCHNL RV PACING THRESHOLD PULSEWIDTH: 0.4 ms
MDC IDC SESS DTM: 20200318213003
MDC IDC SET LEADCHNL RV SENSING SENSITIVITY: 2.8 mV
MDC IDC STAT BRADY AS VS PERCENT: 98 %

## 2018-04-09 DIAGNOSIS — Z992 Dependence on renal dialysis: Secondary | ICD-10-CM | POA: Diagnosis not present

## 2018-04-09 DIAGNOSIS — N2581 Secondary hyperparathyroidism of renal origin: Secondary | ICD-10-CM | POA: Diagnosis not present

## 2018-04-09 DIAGNOSIS — N186 End stage renal disease: Secondary | ICD-10-CM | POA: Diagnosis not present

## 2018-04-09 DIAGNOSIS — D509 Iron deficiency anemia, unspecified: Secondary | ICD-10-CM | POA: Diagnosis not present

## 2018-04-09 DIAGNOSIS — D631 Anemia in chronic kidney disease: Secondary | ICD-10-CM | POA: Diagnosis not present

## 2018-04-12 DIAGNOSIS — D631 Anemia in chronic kidney disease: Secondary | ICD-10-CM | POA: Diagnosis not present

## 2018-04-12 DIAGNOSIS — N186 End stage renal disease: Secondary | ICD-10-CM | POA: Diagnosis not present

## 2018-04-12 DIAGNOSIS — Z992 Dependence on renal dialysis: Secondary | ICD-10-CM | POA: Diagnosis not present

## 2018-04-12 DIAGNOSIS — N2581 Secondary hyperparathyroidism of renal origin: Secondary | ICD-10-CM | POA: Diagnosis not present

## 2018-04-12 DIAGNOSIS — D509 Iron deficiency anemia, unspecified: Secondary | ICD-10-CM | POA: Diagnosis not present

## 2018-04-14 DIAGNOSIS — D631 Anemia in chronic kidney disease: Secondary | ICD-10-CM | POA: Diagnosis not present

## 2018-04-14 DIAGNOSIS — N2581 Secondary hyperparathyroidism of renal origin: Secondary | ICD-10-CM | POA: Diagnosis not present

## 2018-04-14 DIAGNOSIS — D509 Iron deficiency anemia, unspecified: Secondary | ICD-10-CM | POA: Diagnosis not present

## 2018-04-14 DIAGNOSIS — N186 End stage renal disease: Secondary | ICD-10-CM | POA: Diagnosis not present

## 2018-04-14 DIAGNOSIS — Z992 Dependence on renal dialysis: Secondary | ICD-10-CM | POA: Diagnosis not present

## 2018-04-14 NOTE — Progress Notes (Signed)
Remote pacemaker transmission.   

## 2018-04-16 DIAGNOSIS — N186 End stage renal disease: Secondary | ICD-10-CM | POA: Diagnosis not present

## 2018-04-16 DIAGNOSIS — Z992 Dependence on renal dialysis: Secondary | ICD-10-CM | POA: Diagnosis not present

## 2018-04-16 DIAGNOSIS — D631 Anemia in chronic kidney disease: Secondary | ICD-10-CM | POA: Diagnosis not present

## 2018-04-16 DIAGNOSIS — N2581 Secondary hyperparathyroidism of renal origin: Secondary | ICD-10-CM | POA: Diagnosis not present

## 2018-04-16 DIAGNOSIS — D509 Iron deficiency anemia, unspecified: Secondary | ICD-10-CM | POA: Diagnosis not present

## 2018-04-18 DIAGNOSIS — Z79899 Other long term (current) drug therapy: Secondary | ICD-10-CM | POA: Diagnosis not present

## 2018-04-18 DIAGNOSIS — M1710 Unilateral primary osteoarthritis, unspecified knee: Secondary | ICD-10-CM | POA: Diagnosis not present

## 2018-04-18 DIAGNOSIS — M549 Dorsalgia, unspecified: Secondary | ICD-10-CM | POA: Diagnosis not present

## 2018-04-18 DIAGNOSIS — I10 Essential (primary) hypertension: Secondary | ICD-10-CM | POA: Diagnosis not present

## 2018-04-18 DIAGNOSIS — Z6828 Body mass index (BMI) 28.0-28.9, adult: Secondary | ICD-10-CM | POA: Diagnosis not present

## 2018-04-18 DIAGNOSIS — Z299 Encounter for prophylactic measures, unspecified: Secondary | ICD-10-CM | POA: Diagnosis not present

## 2018-04-19 DIAGNOSIS — N2581 Secondary hyperparathyroidism of renal origin: Secondary | ICD-10-CM | POA: Diagnosis not present

## 2018-04-19 DIAGNOSIS — N186 End stage renal disease: Secondary | ICD-10-CM | POA: Diagnosis not present

## 2018-04-19 DIAGNOSIS — D631 Anemia in chronic kidney disease: Secondary | ICD-10-CM | POA: Diagnosis not present

## 2018-04-19 DIAGNOSIS — D509 Iron deficiency anemia, unspecified: Secondary | ICD-10-CM | POA: Diagnosis not present

## 2018-04-19 DIAGNOSIS — Z992 Dependence on renal dialysis: Secondary | ICD-10-CM | POA: Diagnosis not present

## 2018-04-20 ENCOUNTER — Telehealth: Payer: Self-pay

## 2018-04-20 NOTE — Telephone Encounter (Signed)
Left message for pt regarding virtual visit on 04/22/18.

## 2018-04-20 NOTE — Telephone Encounter (Signed)
Follow up:   Patient calling concerning his appt. Patient daughter stated that would be fine. please call patient if any questions.

## 2018-04-21 DIAGNOSIS — D631 Anemia in chronic kidney disease: Secondary | ICD-10-CM | POA: Diagnosis not present

## 2018-04-21 DIAGNOSIS — N2581 Secondary hyperparathyroidism of renal origin: Secondary | ICD-10-CM | POA: Diagnosis not present

## 2018-04-21 DIAGNOSIS — D509 Iron deficiency anemia, unspecified: Secondary | ICD-10-CM | POA: Diagnosis not present

## 2018-04-21 DIAGNOSIS — Z992 Dependence on renal dialysis: Secondary | ICD-10-CM | POA: Diagnosis not present

## 2018-04-21 DIAGNOSIS — N186 End stage renal disease: Secondary | ICD-10-CM | POA: Diagnosis not present

## 2018-04-21 NOTE — Telephone Encounter (Signed)
Spoke with pts daughter regarding MyChart video visit. Pt daughter is was advise to check pulse and BP day of appt. Pt daughter concerns were address.

## 2018-04-22 ENCOUNTER — Telehealth (INDEPENDENT_AMBULATORY_CARE_PROVIDER_SITE_OTHER): Payer: Medicare Other | Admitting: Internal Medicine

## 2018-04-22 DIAGNOSIS — Z950 Presence of cardiac pacemaker: Secondary | ICD-10-CM | POA: Diagnosis not present

## 2018-04-22 DIAGNOSIS — I429 Cardiomyopathy, unspecified: Secondary | ICD-10-CM | POA: Diagnosis not present

## 2018-04-22 DIAGNOSIS — I495 Sick sinus syndrome: Secondary | ICD-10-CM

## 2018-04-22 DIAGNOSIS — I4819 Other persistent atrial fibrillation: Secondary | ICD-10-CM | POA: Diagnosis not present

## 2018-04-22 DIAGNOSIS — Z992 Dependence on renal dialysis: Secondary | ICD-10-CM | POA: Diagnosis not present

## 2018-04-22 DIAGNOSIS — N186 End stage renal disease: Secondary | ICD-10-CM

## 2018-04-22 NOTE — Progress Notes (Signed)
Electrophysiology TeleHealth Note   Due to national recommendations of social distancing due to COVID 19, an audio/video telehealth visit is felt to be most appropriate for this patient at this time.  See MyChart message from today for the patient's consent to telehealth for [facility].   Date:  04/22/2018   ID:  Dale Torres, DOB [birth year], MRN [id number]  Location: patient's home  Provider location: [address], [place]  Evaluation Performed: Follow-up visit  PCP:  Monico Blitz, MD  Cardiologist:  Rozann Lesches, MD  Electrophysiologist:  Dr Rayann Heman  Chief Complaint:  afib  History of Present Illness:    Dale Torres is a 85 y.o. male who presents via audio/video conferencing for a telehealth visit today.  Since last being seen in our clinic, the patient reports doing very well.  He is on dialysis. Today, he denies symptoms of palpitations, chest pain, shortness of breath,  lower extremity edema, dizziness, presyncope, or syncope.  The patient is otherwise without complaint today.  The patient denies symptoms of fevers, chills, cough, or new SOB worrisome for COVID 19.  Past Medical History:  Diagnosis Date  . Anemia    Dr. Sonny Dandy  . Arthritis   . Benign prostatic hypertrophy   . Carpal tunnel syndrome of left wrist   . Coronary atherosclerosis of native coronary artery    Nonobstructive 2009  . ESRD on hemodialysis ([place])   . Essential hypertension, benign   . GERD (gastroesophageal reflux disease)   . Headache   . History of pneumonia   . HOH (hard of hearing)    Bilateral  hearing aids  . Hypothyroidism   . Persistent atrial fibrillation    Declines coumadin  . Presence of permanent cardiac pacemaker   . Sick sinus syndrome [facility])    Status post pacemaker placement 2008  . Skin cancer, basal cell     Past Surgical History:  Procedure Laterality Date  . A/V FISTULAGRAM N/A 01/06/2017   Procedure: A/V FISTULAGRAM;  Surgeon: Waynetta Sandy, MD;  Location: [place] [facility];  Service: Cardiovascular;  Laterality: N/A;  . APPENDECTOMY    . AV FISTULA PLACEMENT Left 08/16/2014   Procedure: Left Arm creation of Brachiocephalic ARTERIOVENOUS (AV) FISTULA ;  Surgeon: Angelia Mould, MD;  Location: [place];  Service: Vascular;  Laterality: Left;  . AV FISTULA PLACEMENT Right 06/24/2016   Procedure: CREATION OF RIGHT RADIOCEPHALIC ARTERIOVENOUS (AV);  Surgeon: Rosetta Posner, MD;  Location: [place];  Service: Vascular;  Laterality: Right;  . CHOLECYSTECTOMY    . COLONOSCOPY W/ BIOPSIES AND POLYPECTOMY    . EYE SURGERY    . INSERTION OF DIALYSIS CATHETER Left 06/24/2016   Procedure: INSERTION OF DIALYSIS CATHETER - left Internal Jugular placement;  Surgeon: Rosetta Posner, MD;  Location: [place];  Service: Vascular;  Laterality: Left;  . IR FLUORO GUIDE CV LINE RIGHT  08/06/2016  . LAMINECTOMY    . LIGATION OF ARTERIOVENOUS  FISTULA Left 06/24/2016   Procedure: LIGATION OF left arm  ARTERIOVENOUS  FISTULA;  Surgeon: Rosetta Posner, MD;  Location: [place];  Service: Vascular;  Laterality: Left;  . PACEMAKER GENERATOR CHANGE  03/03/11   MDT Adaptal L implanted by Dr Rayann Heman  . PACEMAKER PLACEMENT  2008   Medtronic - Dr. Doreatha Lew  . PERIPHERAL VASCULAR CATHETERIZATION Left 03/06/2015   Procedure: Fistulagram;  Surgeon: Serafina Mitchell, MD;  Location: [place] [facility];  Service: Cardiovascular;  Laterality: Left;  . PERMANENT PACEMAKER GENERATOR CHANGE N/A 03/03/2011   Procedure: PERMANENT PACEMAKER GENERATOR CHANGE;  Surgeon: Thompson Grayer, MD;  Location: [facility] CATH LAB;  Service: Cardiovascular;  Laterality: N/A;  . Right rotator cuff repair    . THROMBECTOMY AND REVISION OF ARTERIOVENTOUS (AV) GORETEX  GRAFT Left 03/11/2015   Procedure: THROMBECTOMY AND REVISION OF ARTERIOVENTOUS (AV) GORETEX  GRAFT LEFT ARM;  Surgeon: Rosetta Posner, MD;  Location: [place];  Service: Vascular;  Laterality: Left;  . TONSILLECTOMY      Current Outpatient  Medications  Medication Sig Dispense Refill  . amiodarone (PACERONE) 200 MG tablet Take 200 mg by mouth daily.    Marland Kitchen aspirin EC 81 MG tablet Take 81 mg by mouth at bedtime.    Lorin Picket 1 GM 210 MG(Fe) tablet Place 1 tablet inside cheek 2 (two) times daily.    . carvedilol (COREG) 3.125 MG tablet Take 1 tablet (3.125 mg total) by mouth 2 (two) times daily. Except, do not take the morning dose on your dialysis days 180 tablet 1  . docusate sodium (COLACE) 100 MG capsule Take 100 mg by mouth daily.     . folic acid (FOLVITE) 1 MG tablet Take 1 mg by mouth daily.      Marland Kitchen gabapentin (NEURONTIN) 400 MG capsule Take 400 mg by mouth at bedtime.     Marland Kitchen HYDROcodone-acetaminophen (NORCO) 10-325 MG tablet Take 1 tablet by mouth every 6 (six) hours as needed for severe pain. (Patient taking differently: Take 1 tablet by mouth 3 (three) times daily. ) 15 tablet 0  . levothyroxine (SYNTHROID, LEVOTHROID) 125 MCG tablet Take 125 mcg by mouth daily at 2 PM. In the afternoon.    . Melatonin 5 MG TABS Take 5 mg by mouth at bedtime.    . midodrine (PROAMATINE) 10 MG tablet Take 10 mg by mouth 2 (two) times daily.     . naproxen sodium (ALEVE) 220 MG tablet Take 440 mg by mouth daily as needed (for pain.).    Marland Kitchen nitroGLYCERIN (NITROSTAT) 0.4 MG SL tablet Place 1 tablet (0.4 mg total) under the tongue every 5 (five) minutes as needed for chest pain (up to 3 doses. If no relief after 3rd dose, proceed to the ED for an evaluation or call 911). 25 tablet 3  . omeprazole (PRILOSEC) 20 MG capsule Take 20 mg by mouth at bedtime.     . tamsulosin (FLOMAX) 0.4 MG CAPS capsule Take 0.4 mg by mouth at bedtime.     No current facility-administered medications for this visit.     Allergies:   Oxycodone hcl   Social History:  The patient  reports that he quit smoking about 58 years ago. His smoking use included cigarettes. He started smoking about 70 years ago. He has a 9.60 pack-year smoking history. He quit smokeless tobacco use  about 38 years ago.  His smokeless tobacco use included chew. He reports current alcohol use. He reports that he does not use drugs.   Family History:  The patient's  family history includes Coronary artery disease in his father, mother, and sister; Heart disease in his father, mother, and sister.   ROS:  Please see the history of present illness.   All other systems are personally reviewed and negative.    Exam:    Vital Signs:  There were no vitals taken for this visit.  Well appearing, alert and conversant, regular work of breathing,  good skin color Eyes- anicteric, neuro-  grossly intact, skin- no apparent rash or lesions or cyanosis, mouth- oral mucosa is pink   Labs/Other Tests and Data Reviewed:    Recent Labs: No results found for requested labs within last 8760 hours.   Wt Readings from Last 3 Encounters:  03/09/18 165 lb (74.8 kg)  11/03/17 163 lb 6.4 oz (74.1 kg)  07/26/17 173 lb 6.4 oz (78.7 kg)     Other studies personally reviewed: Additional studies/ records that were reviewed today include: my prior office notes, Dr McDowell's notes  Review of the above records today demonstrates: as above Prior radiographs:  None pertinent   Last device remote is reviewed from [place] PDF dated 04/06/2018 which reveals normal device function, he is now persistently in [place]:    1.  Longstanding persistent afib Again declines anticoagulation Given advanced age and dialysis, I think his risks of bleeding with coumadin would be high.  2. Sinus bradycadria Normal pacemaker function No changes today Consider VVIR programming on return to the office if still in AF at that time. Would also consider stopping amiodarone if he remains in afib on return to see Dr Domenic Polite.  This may help with low BPs that he has from time to time.  3. Nonischemic CM Stable No changes Not a candidate for device upgrade given advanced age  34. COVID 19 screen The patient denies  symptoms of COVID 19 at this time.  The importance of social distancing was discussed today.  Follow-up:  Return to see me in a year Next remote: 6.2020  Current medicines are reviewed at length with the patient today.   The patient does not have concerns regarding his medicines.  The following changes were made today:  none  Labs/ tests ordered today include:  No orders of the defined types were placed in this encounter.    Patient Risk:  after full review of this patients clinical status, I feel that they are at moderate risk at this time.  Today, I have spent 25 minutes with the patient with telehealth technology discussing afib.    Army Fossa, MD  04/22/2018 11:04 AM     [facility] [address] [place] [address] [phone] (office) [phone] (fax)

## 2018-04-23 DIAGNOSIS — N2581 Secondary hyperparathyroidism of renal origin: Secondary | ICD-10-CM | POA: Diagnosis not present

## 2018-04-23 DIAGNOSIS — N186 End stage renal disease: Secondary | ICD-10-CM | POA: Diagnosis not present

## 2018-04-23 DIAGNOSIS — Z992 Dependence on renal dialysis: Secondary | ICD-10-CM | POA: Diagnosis not present

## 2018-04-23 DIAGNOSIS — D631 Anemia in chronic kidney disease: Secondary | ICD-10-CM | POA: Diagnosis not present

## 2018-04-23 DIAGNOSIS — D509 Iron deficiency anemia, unspecified: Secondary | ICD-10-CM | POA: Diagnosis not present

## 2018-04-26 DIAGNOSIS — D631 Anemia in chronic kidney disease: Secondary | ICD-10-CM | POA: Diagnosis not present

## 2018-04-26 DIAGNOSIS — N186 End stage renal disease: Secondary | ICD-10-CM | POA: Diagnosis not present

## 2018-04-26 DIAGNOSIS — D509 Iron deficiency anemia, unspecified: Secondary | ICD-10-CM | POA: Diagnosis not present

## 2018-04-26 DIAGNOSIS — Z992 Dependence on renal dialysis: Secondary | ICD-10-CM | POA: Diagnosis not present

## 2018-04-26 DIAGNOSIS — N2581 Secondary hyperparathyroidism of renal origin: Secondary | ICD-10-CM | POA: Diagnosis not present

## 2018-04-28 DIAGNOSIS — N2581 Secondary hyperparathyroidism of renal origin: Secondary | ICD-10-CM | POA: Diagnosis not present

## 2018-04-28 DIAGNOSIS — D509 Iron deficiency anemia, unspecified: Secondary | ICD-10-CM | POA: Diagnosis not present

## 2018-04-28 DIAGNOSIS — D631 Anemia in chronic kidney disease: Secondary | ICD-10-CM | POA: Diagnosis not present

## 2018-04-28 DIAGNOSIS — Z992 Dependence on renal dialysis: Secondary | ICD-10-CM | POA: Diagnosis not present

## 2018-04-28 DIAGNOSIS — N186 End stage renal disease: Secondary | ICD-10-CM | POA: Diagnosis not present

## 2018-04-30 DIAGNOSIS — Z992 Dependence on renal dialysis: Secondary | ICD-10-CM | POA: Diagnosis not present

## 2018-04-30 DIAGNOSIS — D631 Anemia in chronic kidney disease: Secondary | ICD-10-CM | POA: Diagnosis not present

## 2018-04-30 DIAGNOSIS — N2581 Secondary hyperparathyroidism of renal origin: Secondary | ICD-10-CM | POA: Diagnosis not present

## 2018-04-30 DIAGNOSIS — D509 Iron deficiency anemia, unspecified: Secondary | ICD-10-CM | POA: Diagnosis not present

## 2018-04-30 DIAGNOSIS — N186 End stage renal disease: Secondary | ICD-10-CM | POA: Diagnosis not present

## 2018-05-03 DIAGNOSIS — Z992 Dependence on renal dialysis: Secondary | ICD-10-CM | POA: Diagnosis not present

## 2018-05-03 DIAGNOSIS — N186 End stage renal disease: Secondary | ICD-10-CM | POA: Diagnosis not present

## 2018-05-03 DIAGNOSIS — D631 Anemia in chronic kidney disease: Secondary | ICD-10-CM | POA: Diagnosis not present

## 2018-05-03 DIAGNOSIS — N2581 Secondary hyperparathyroidism of renal origin: Secondary | ICD-10-CM | POA: Diagnosis not present

## 2018-05-03 DIAGNOSIS — D509 Iron deficiency anemia, unspecified: Secondary | ICD-10-CM | POA: Diagnosis not present

## 2018-05-05 DIAGNOSIS — D509 Iron deficiency anemia, unspecified: Secondary | ICD-10-CM | POA: Diagnosis not present

## 2018-05-05 DIAGNOSIS — D631 Anemia in chronic kidney disease: Secondary | ICD-10-CM | POA: Diagnosis not present

## 2018-05-05 DIAGNOSIS — Z992 Dependence on renal dialysis: Secondary | ICD-10-CM | POA: Diagnosis not present

## 2018-05-05 DIAGNOSIS — N186 End stage renal disease: Secondary | ICD-10-CM | POA: Diagnosis not present

## 2018-05-05 DIAGNOSIS — N2581 Secondary hyperparathyroidism of renal origin: Secondary | ICD-10-CM | POA: Diagnosis not present

## 2018-05-07 DIAGNOSIS — N2581 Secondary hyperparathyroidism of renal origin: Secondary | ICD-10-CM | POA: Diagnosis not present

## 2018-05-07 DIAGNOSIS — D509 Iron deficiency anemia, unspecified: Secondary | ICD-10-CM | POA: Diagnosis not present

## 2018-05-07 DIAGNOSIS — D631 Anemia in chronic kidney disease: Secondary | ICD-10-CM | POA: Diagnosis not present

## 2018-05-07 DIAGNOSIS — N186 End stage renal disease: Secondary | ICD-10-CM | POA: Diagnosis not present

## 2018-05-07 DIAGNOSIS — Z992 Dependence on renal dialysis: Secondary | ICD-10-CM | POA: Diagnosis not present

## 2018-05-10 DIAGNOSIS — N186 End stage renal disease: Secondary | ICD-10-CM | POA: Diagnosis not present

## 2018-05-10 DIAGNOSIS — D509 Iron deficiency anemia, unspecified: Secondary | ICD-10-CM | POA: Diagnosis not present

## 2018-05-10 DIAGNOSIS — D631 Anemia in chronic kidney disease: Secondary | ICD-10-CM | POA: Diagnosis not present

## 2018-05-10 DIAGNOSIS — Z992 Dependence on renal dialysis: Secondary | ICD-10-CM | POA: Diagnosis not present

## 2018-05-10 DIAGNOSIS — N2581 Secondary hyperparathyroidism of renal origin: Secondary | ICD-10-CM | POA: Diagnosis not present

## 2018-05-12 DIAGNOSIS — N2581 Secondary hyperparathyroidism of renal origin: Secondary | ICD-10-CM | POA: Diagnosis not present

## 2018-05-12 DIAGNOSIS — D631 Anemia in chronic kidney disease: Secondary | ICD-10-CM | POA: Diagnosis not present

## 2018-05-12 DIAGNOSIS — D509 Iron deficiency anemia, unspecified: Secondary | ICD-10-CM | POA: Diagnosis not present

## 2018-05-12 DIAGNOSIS — N186 End stage renal disease: Secondary | ICD-10-CM | POA: Diagnosis not present

## 2018-05-12 DIAGNOSIS — Z992 Dependence on renal dialysis: Secondary | ICD-10-CM | POA: Diagnosis not present

## 2018-05-14 DIAGNOSIS — D631 Anemia in chronic kidney disease: Secondary | ICD-10-CM | POA: Diagnosis not present

## 2018-05-14 DIAGNOSIS — N2581 Secondary hyperparathyroidism of renal origin: Secondary | ICD-10-CM | POA: Diagnosis not present

## 2018-05-14 DIAGNOSIS — Z992 Dependence on renal dialysis: Secondary | ICD-10-CM | POA: Diagnosis not present

## 2018-05-14 DIAGNOSIS — D509 Iron deficiency anemia, unspecified: Secondary | ICD-10-CM | POA: Diagnosis not present

## 2018-05-14 DIAGNOSIS — N186 End stage renal disease: Secondary | ICD-10-CM | POA: Diagnosis not present

## 2018-05-17 DIAGNOSIS — D631 Anemia in chronic kidney disease: Secondary | ICD-10-CM | POA: Diagnosis not present

## 2018-05-17 DIAGNOSIS — Z992 Dependence on renal dialysis: Secondary | ICD-10-CM | POA: Diagnosis not present

## 2018-05-17 DIAGNOSIS — N186 End stage renal disease: Secondary | ICD-10-CM | POA: Diagnosis not present

## 2018-05-17 DIAGNOSIS — N2581 Secondary hyperparathyroidism of renal origin: Secondary | ICD-10-CM | POA: Diagnosis not present

## 2018-05-17 DIAGNOSIS — D509 Iron deficiency anemia, unspecified: Secondary | ICD-10-CM | POA: Diagnosis not present

## 2018-05-19 DIAGNOSIS — N2581 Secondary hyperparathyroidism of renal origin: Secondary | ICD-10-CM | POA: Diagnosis not present

## 2018-05-19 DIAGNOSIS — D631 Anemia in chronic kidney disease: Secondary | ICD-10-CM | POA: Diagnosis not present

## 2018-05-19 DIAGNOSIS — N186 End stage renal disease: Secondary | ICD-10-CM | POA: Diagnosis not present

## 2018-05-19 DIAGNOSIS — D509 Iron deficiency anemia, unspecified: Secondary | ICD-10-CM | POA: Diagnosis not present

## 2018-05-19 DIAGNOSIS — Z992 Dependence on renal dialysis: Secondary | ICD-10-CM | POA: Diagnosis not present

## 2018-05-21 DIAGNOSIS — Z992 Dependence on renal dialysis: Secondary | ICD-10-CM | POA: Diagnosis not present

## 2018-05-21 DIAGNOSIS — D509 Iron deficiency anemia, unspecified: Secondary | ICD-10-CM | POA: Diagnosis not present

## 2018-05-21 DIAGNOSIS — N2581 Secondary hyperparathyroidism of renal origin: Secondary | ICD-10-CM | POA: Diagnosis not present

## 2018-05-21 DIAGNOSIS — D631 Anemia in chronic kidney disease: Secondary | ICD-10-CM | POA: Diagnosis not present

## 2018-05-21 DIAGNOSIS — N186 End stage renal disease: Secondary | ICD-10-CM | POA: Diagnosis not present

## 2018-05-24 DIAGNOSIS — D509 Iron deficiency anemia, unspecified: Secondary | ICD-10-CM | POA: Diagnosis not present

## 2018-05-24 DIAGNOSIS — E039 Hypothyroidism, unspecified: Secondary | ICD-10-CM | POA: Diagnosis not present

## 2018-05-24 DIAGNOSIS — Z299 Encounter for prophylactic measures, unspecified: Secondary | ICD-10-CM | POA: Diagnosis not present

## 2018-05-24 DIAGNOSIS — D631 Anemia in chronic kidney disease: Secondary | ICD-10-CM | POA: Diagnosis not present

## 2018-05-24 DIAGNOSIS — I4891 Unspecified atrial fibrillation: Secondary | ICD-10-CM | POA: Diagnosis not present

## 2018-05-24 DIAGNOSIS — Z992 Dependence on renal dialysis: Secondary | ICD-10-CM | POA: Diagnosis not present

## 2018-05-24 DIAGNOSIS — Z6828 Body mass index (BMI) 28.0-28.9, adult: Secondary | ICD-10-CM | POA: Diagnosis not present

## 2018-05-24 DIAGNOSIS — I770 Arteriovenous fistula, acquired: Secondary | ICD-10-CM | POA: Diagnosis not present

## 2018-05-24 DIAGNOSIS — I10 Essential (primary) hypertension: Secondary | ICD-10-CM | POA: Diagnosis not present

## 2018-05-24 DIAGNOSIS — N2581 Secondary hyperparathyroidism of renal origin: Secondary | ICD-10-CM | POA: Diagnosis not present

## 2018-05-24 DIAGNOSIS — M549 Dorsalgia, unspecified: Secondary | ICD-10-CM | POA: Diagnosis not present

## 2018-05-24 DIAGNOSIS — N186 End stage renal disease: Secondary | ICD-10-CM | POA: Diagnosis not present

## 2018-05-26 DIAGNOSIS — D509 Iron deficiency anemia, unspecified: Secondary | ICD-10-CM | POA: Diagnosis not present

## 2018-05-26 DIAGNOSIS — N2581 Secondary hyperparathyroidism of renal origin: Secondary | ICD-10-CM | POA: Diagnosis not present

## 2018-05-26 DIAGNOSIS — N186 End stage renal disease: Secondary | ICD-10-CM | POA: Diagnosis not present

## 2018-05-26 DIAGNOSIS — Z992 Dependence on renal dialysis: Secondary | ICD-10-CM | POA: Diagnosis not present

## 2018-05-26 DIAGNOSIS — D631 Anemia in chronic kidney disease: Secondary | ICD-10-CM | POA: Diagnosis not present

## 2018-05-28 DIAGNOSIS — D509 Iron deficiency anemia, unspecified: Secondary | ICD-10-CM | POA: Diagnosis not present

## 2018-05-28 DIAGNOSIS — Z992 Dependence on renal dialysis: Secondary | ICD-10-CM | POA: Diagnosis not present

## 2018-05-28 DIAGNOSIS — N186 End stage renal disease: Secondary | ICD-10-CM | POA: Diagnosis not present

## 2018-05-28 DIAGNOSIS — N2581 Secondary hyperparathyroidism of renal origin: Secondary | ICD-10-CM | POA: Diagnosis not present

## 2018-05-28 DIAGNOSIS — D631 Anemia in chronic kidney disease: Secondary | ICD-10-CM | POA: Diagnosis not present

## 2018-05-31 DIAGNOSIS — N186 End stage renal disease: Secondary | ICD-10-CM | POA: Diagnosis not present

## 2018-05-31 DIAGNOSIS — N2581 Secondary hyperparathyroidism of renal origin: Secondary | ICD-10-CM | POA: Diagnosis not present

## 2018-05-31 DIAGNOSIS — D509 Iron deficiency anemia, unspecified: Secondary | ICD-10-CM | POA: Diagnosis not present

## 2018-05-31 DIAGNOSIS — D631 Anemia in chronic kidney disease: Secondary | ICD-10-CM | POA: Diagnosis not present

## 2018-05-31 DIAGNOSIS — Z992 Dependence on renal dialysis: Secondary | ICD-10-CM | POA: Diagnosis not present

## 2018-06-02 DIAGNOSIS — D631 Anemia in chronic kidney disease: Secondary | ICD-10-CM | POA: Diagnosis not present

## 2018-06-02 DIAGNOSIS — N186 End stage renal disease: Secondary | ICD-10-CM | POA: Diagnosis not present

## 2018-06-02 DIAGNOSIS — N2581 Secondary hyperparathyroidism of renal origin: Secondary | ICD-10-CM | POA: Diagnosis not present

## 2018-06-02 DIAGNOSIS — D509 Iron deficiency anemia, unspecified: Secondary | ICD-10-CM | POA: Diagnosis not present

## 2018-06-02 DIAGNOSIS — Z992 Dependence on renal dialysis: Secondary | ICD-10-CM | POA: Diagnosis not present

## 2018-06-04 DIAGNOSIS — D509 Iron deficiency anemia, unspecified: Secondary | ICD-10-CM | POA: Diagnosis not present

## 2018-06-04 DIAGNOSIS — N186 End stage renal disease: Secondary | ICD-10-CM | POA: Diagnosis not present

## 2018-06-04 DIAGNOSIS — D631 Anemia in chronic kidney disease: Secondary | ICD-10-CM | POA: Diagnosis not present

## 2018-06-04 DIAGNOSIS — N2581 Secondary hyperparathyroidism of renal origin: Secondary | ICD-10-CM | POA: Diagnosis not present

## 2018-06-04 DIAGNOSIS — Z992 Dependence on renal dialysis: Secondary | ICD-10-CM | POA: Diagnosis not present

## 2018-06-07 DIAGNOSIS — D631 Anemia in chronic kidney disease: Secondary | ICD-10-CM | POA: Diagnosis not present

## 2018-06-07 DIAGNOSIS — N186 End stage renal disease: Secondary | ICD-10-CM | POA: Diagnosis not present

## 2018-06-07 DIAGNOSIS — Z992 Dependence on renal dialysis: Secondary | ICD-10-CM | POA: Diagnosis not present

## 2018-06-07 DIAGNOSIS — N2581 Secondary hyperparathyroidism of renal origin: Secondary | ICD-10-CM | POA: Diagnosis not present

## 2018-06-07 DIAGNOSIS — D509 Iron deficiency anemia, unspecified: Secondary | ICD-10-CM | POA: Diagnosis not present

## 2018-06-09 DIAGNOSIS — D631 Anemia in chronic kidney disease: Secondary | ICD-10-CM | POA: Diagnosis not present

## 2018-06-09 DIAGNOSIS — D509 Iron deficiency anemia, unspecified: Secondary | ICD-10-CM | POA: Diagnosis not present

## 2018-06-09 DIAGNOSIS — N186 End stage renal disease: Secondary | ICD-10-CM | POA: Diagnosis not present

## 2018-06-09 DIAGNOSIS — Z992 Dependence on renal dialysis: Secondary | ICD-10-CM | POA: Diagnosis not present

## 2018-06-09 DIAGNOSIS — N2581 Secondary hyperparathyroidism of renal origin: Secondary | ICD-10-CM | POA: Diagnosis not present

## 2018-06-11 DIAGNOSIS — N2581 Secondary hyperparathyroidism of renal origin: Secondary | ICD-10-CM | POA: Diagnosis not present

## 2018-06-11 DIAGNOSIS — Z992 Dependence on renal dialysis: Secondary | ICD-10-CM | POA: Diagnosis not present

## 2018-06-11 DIAGNOSIS — D509 Iron deficiency anemia, unspecified: Secondary | ICD-10-CM | POA: Diagnosis not present

## 2018-06-11 DIAGNOSIS — D631 Anemia in chronic kidney disease: Secondary | ICD-10-CM | POA: Diagnosis not present

## 2018-06-11 DIAGNOSIS — N186 End stage renal disease: Secondary | ICD-10-CM | POA: Diagnosis not present

## 2018-06-14 DIAGNOSIS — N2581 Secondary hyperparathyroidism of renal origin: Secondary | ICD-10-CM | POA: Diagnosis not present

## 2018-06-14 DIAGNOSIS — Z992 Dependence on renal dialysis: Secondary | ICD-10-CM | POA: Diagnosis not present

## 2018-06-14 DIAGNOSIS — D509 Iron deficiency anemia, unspecified: Secondary | ICD-10-CM | POA: Diagnosis not present

## 2018-06-14 DIAGNOSIS — N186 End stage renal disease: Secondary | ICD-10-CM | POA: Diagnosis not present

## 2018-06-14 DIAGNOSIS — D631 Anemia in chronic kidney disease: Secondary | ICD-10-CM | POA: Diagnosis not present

## 2018-06-16 DIAGNOSIS — D631 Anemia in chronic kidney disease: Secondary | ICD-10-CM | POA: Diagnosis not present

## 2018-06-16 DIAGNOSIS — D509 Iron deficiency anemia, unspecified: Secondary | ICD-10-CM | POA: Diagnosis not present

## 2018-06-16 DIAGNOSIS — N2581 Secondary hyperparathyroidism of renal origin: Secondary | ICD-10-CM | POA: Diagnosis not present

## 2018-06-16 DIAGNOSIS — Z992 Dependence on renal dialysis: Secondary | ICD-10-CM | POA: Diagnosis not present

## 2018-06-16 DIAGNOSIS — N186 End stage renal disease: Secondary | ICD-10-CM | POA: Diagnosis not present

## 2018-06-18 DIAGNOSIS — N2581 Secondary hyperparathyroidism of renal origin: Secondary | ICD-10-CM | POA: Diagnosis not present

## 2018-06-18 DIAGNOSIS — N186 End stage renal disease: Secondary | ICD-10-CM | POA: Diagnosis not present

## 2018-06-18 DIAGNOSIS — D631 Anemia in chronic kidney disease: Secondary | ICD-10-CM | POA: Diagnosis not present

## 2018-06-18 DIAGNOSIS — Z992 Dependence on renal dialysis: Secondary | ICD-10-CM | POA: Diagnosis not present

## 2018-06-18 DIAGNOSIS — D509 Iron deficiency anemia, unspecified: Secondary | ICD-10-CM | POA: Diagnosis not present

## 2018-06-19 DIAGNOSIS — N186 End stage renal disease: Secondary | ICD-10-CM | POA: Diagnosis not present

## 2018-06-19 DIAGNOSIS — Z992 Dependence on renal dialysis: Secondary | ICD-10-CM | POA: Diagnosis not present

## 2018-06-21 DIAGNOSIS — Z992 Dependence on renal dialysis: Secondary | ICD-10-CM | POA: Diagnosis not present

## 2018-06-21 DIAGNOSIS — D509 Iron deficiency anemia, unspecified: Secondary | ICD-10-CM | POA: Diagnosis not present

## 2018-06-21 DIAGNOSIS — N2581 Secondary hyperparathyroidism of renal origin: Secondary | ICD-10-CM | POA: Diagnosis not present

## 2018-06-21 DIAGNOSIS — D631 Anemia in chronic kidney disease: Secondary | ICD-10-CM | POA: Diagnosis not present

## 2018-06-21 DIAGNOSIS — N186 End stage renal disease: Secondary | ICD-10-CM | POA: Diagnosis not present

## 2018-06-23 DIAGNOSIS — N2581 Secondary hyperparathyroidism of renal origin: Secondary | ICD-10-CM | POA: Diagnosis not present

## 2018-06-23 DIAGNOSIS — Z299 Encounter for prophylactic measures, unspecified: Secondary | ICD-10-CM | POA: Diagnosis not present

## 2018-06-23 DIAGNOSIS — I770 Arteriovenous fistula, acquired: Secondary | ICD-10-CM | POA: Diagnosis not present

## 2018-06-23 DIAGNOSIS — E7800 Pure hypercholesterolemia, unspecified: Secondary | ICD-10-CM | POA: Diagnosis not present

## 2018-06-23 DIAGNOSIS — Z992 Dependence on renal dialysis: Secondary | ICD-10-CM | POA: Diagnosis not present

## 2018-06-23 DIAGNOSIS — D509 Iron deficiency anemia, unspecified: Secondary | ICD-10-CM | POA: Diagnosis not present

## 2018-06-23 DIAGNOSIS — I10 Essential (primary) hypertension: Secondary | ICD-10-CM | POA: Diagnosis not present

## 2018-06-23 DIAGNOSIS — Z6828 Body mass index (BMI) 28.0-28.9, adult: Secondary | ICD-10-CM | POA: Diagnosis not present

## 2018-06-23 DIAGNOSIS — M549 Dorsalgia, unspecified: Secondary | ICD-10-CM | POA: Diagnosis not present

## 2018-06-23 DIAGNOSIS — Z79899 Other long term (current) drug therapy: Secondary | ICD-10-CM | POA: Diagnosis not present

## 2018-06-23 DIAGNOSIS — N186 End stage renal disease: Secondary | ICD-10-CM | POA: Diagnosis not present

## 2018-06-23 DIAGNOSIS — D631 Anemia in chronic kidney disease: Secondary | ICD-10-CM | POA: Diagnosis not present

## 2018-06-25 DIAGNOSIS — N2581 Secondary hyperparathyroidism of renal origin: Secondary | ICD-10-CM | POA: Diagnosis not present

## 2018-06-25 DIAGNOSIS — D509 Iron deficiency anemia, unspecified: Secondary | ICD-10-CM | POA: Diagnosis not present

## 2018-06-25 DIAGNOSIS — D631 Anemia in chronic kidney disease: Secondary | ICD-10-CM | POA: Diagnosis not present

## 2018-06-25 DIAGNOSIS — Z992 Dependence on renal dialysis: Secondary | ICD-10-CM | POA: Diagnosis not present

## 2018-06-25 DIAGNOSIS — N186 End stage renal disease: Secondary | ICD-10-CM | POA: Diagnosis not present

## 2018-06-28 DIAGNOSIS — N186 End stage renal disease: Secondary | ICD-10-CM | POA: Diagnosis not present

## 2018-06-28 DIAGNOSIS — N2581 Secondary hyperparathyroidism of renal origin: Secondary | ICD-10-CM | POA: Diagnosis not present

## 2018-06-28 DIAGNOSIS — D509 Iron deficiency anemia, unspecified: Secondary | ICD-10-CM | POA: Diagnosis not present

## 2018-06-28 DIAGNOSIS — Z992 Dependence on renal dialysis: Secondary | ICD-10-CM | POA: Diagnosis not present

## 2018-06-28 DIAGNOSIS — D631 Anemia in chronic kidney disease: Secondary | ICD-10-CM | POA: Diagnosis not present

## 2018-06-30 DIAGNOSIS — D509 Iron deficiency anemia, unspecified: Secondary | ICD-10-CM | POA: Diagnosis not present

## 2018-06-30 DIAGNOSIS — N2581 Secondary hyperparathyroidism of renal origin: Secondary | ICD-10-CM | POA: Diagnosis not present

## 2018-06-30 DIAGNOSIS — Z992 Dependence on renal dialysis: Secondary | ICD-10-CM | POA: Diagnosis not present

## 2018-06-30 DIAGNOSIS — N186 End stage renal disease: Secondary | ICD-10-CM | POA: Diagnosis not present

## 2018-06-30 DIAGNOSIS — D631 Anemia in chronic kidney disease: Secondary | ICD-10-CM | POA: Diagnosis not present

## 2018-07-02 DIAGNOSIS — D631 Anemia in chronic kidney disease: Secondary | ICD-10-CM | POA: Diagnosis not present

## 2018-07-02 DIAGNOSIS — D509 Iron deficiency anemia, unspecified: Secondary | ICD-10-CM | POA: Diagnosis not present

## 2018-07-02 DIAGNOSIS — N186 End stage renal disease: Secondary | ICD-10-CM | POA: Diagnosis not present

## 2018-07-02 DIAGNOSIS — N2581 Secondary hyperparathyroidism of renal origin: Secondary | ICD-10-CM | POA: Diagnosis not present

## 2018-07-02 DIAGNOSIS — Z992 Dependence on renal dialysis: Secondary | ICD-10-CM | POA: Diagnosis not present

## 2018-07-05 DIAGNOSIS — D631 Anemia in chronic kidney disease: Secondary | ICD-10-CM | POA: Diagnosis not present

## 2018-07-05 DIAGNOSIS — N186 End stage renal disease: Secondary | ICD-10-CM | POA: Diagnosis not present

## 2018-07-05 DIAGNOSIS — D509 Iron deficiency anemia, unspecified: Secondary | ICD-10-CM | POA: Diagnosis not present

## 2018-07-05 DIAGNOSIS — Z992 Dependence on renal dialysis: Secondary | ICD-10-CM | POA: Diagnosis not present

## 2018-07-05 DIAGNOSIS — N2581 Secondary hyperparathyroidism of renal origin: Secondary | ICD-10-CM | POA: Diagnosis not present

## 2018-07-07 ENCOUNTER — Encounter: Payer: Medicare Other | Admitting: *Deleted

## 2018-07-07 DIAGNOSIS — D509 Iron deficiency anemia, unspecified: Secondary | ICD-10-CM | POA: Diagnosis not present

## 2018-07-07 DIAGNOSIS — D631 Anemia in chronic kidney disease: Secondary | ICD-10-CM | POA: Diagnosis not present

## 2018-07-07 DIAGNOSIS — N2581 Secondary hyperparathyroidism of renal origin: Secondary | ICD-10-CM | POA: Diagnosis not present

## 2018-07-07 DIAGNOSIS — N186 End stage renal disease: Secondary | ICD-10-CM | POA: Diagnosis not present

## 2018-07-07 DIAGNOSIS — Z992 Dependence on renal dialysis: Secondary | ICD-10-CM | POA: Diagnosis not present

## 2018-07-08 ENCOUNTER — Telehealth: Payer: Self-pay

## 2018-07-08 NOTE — Telephone Encounter (Signed)
Left message for patient to remind of missed remote transmission.  

## 2018-07-09 DIAGNOSIS — N2581 Secondary hyperparathyroidism of renal origin: Secondary | ICD-10-CM | POA: Diagnosis not present

## 2018-07-09 DIAGNOSIS — Z992 Dependence on renal dialysis: Secondary | ICD-10-CM | POA: Diagnosis not present

## 2018-07-09 DIAGNOSIS — N186 End stage renal disease: Secondary | ICD-10-CM | POA: Diagnosis not present

## 2018-07-09 DIAGNOSIS — D509 Iron deficiency anemia, unspecified: Secondary | ICD-10-CM | POA: Diagnosis not present

## 2018-07-09 DIAGNOSIS — D631 Anemia in chronic kidney disease: Secondary | ICD-10-CM | POA: Diagnosis not present

## 2018-07-11 ENCOUNTER — Other Ambulatory Visit: Payer: Self-pay | Admitting: Cardiology

## 2018-07-12 DIAGNOSIS — Z992 Dependence on renal dialysis: Secondary | ICD-10-CM | POA: Diagnosis not present

## 2018-07-12 DIAGNOSIS — N2581 Secondary hyperparathyroidism of renal origin: Secondary | ICD-10-CM | POA: Diagnosis not present

## 2018-07-12 DIAGNOSIS — D631 Anemia in chronic kidney disease: Secondary | ICD-10-CM | POA: Diagnosis not present

## 2018-07-12 DIAGNOSIS — N186 End stage renal disease: Secondary | ICD-10-CM | POA: Diagnosis not present

## 2018-07-12 DIAGNOSIS — D509 Iron deficiency anemia, unspecified: Secondary | ICD-10-CM | POA: Diagnosis not present

## 2018-07-14 DIAGNOSIS — D631 Anemia in chronic kidney disease: Secondary | ICD-10-CM | POA: Diagnosis not present

## 2018-07-14 DIAGNOSIS — D509 Iron deficiency anemia, unspecified: Secondary | ICD-10-CM | POA: Diagnosis not present

## 2018-07-14 DIAGNOSIS — N186 End stage renal disease: Secondary | ICD-10-CM | POA: Diagnosis not present

## 2018-07-14 DIAGNOSIS — Z992 Dependence on renal dialysis: Secondary | ICD-10-CM | POA: Diagnosis not present

## 2018-07-14 DIAGNOSIS — N2581 Secondary hyperparathyroidism of renal origin: Secondary | ICD-10-CM | POA: Diagnosis not present

## 2018-07-15 ENCOUNTER — Ambulatory Visit (INDEPENDENT_AMBULATORY_CARE_PROVIDER_SITE_OTHER): Payer: Medicare Other | Admitting: *Deleted

## 2018-07-15 DIAGNOSIS — I495 Sick sinus syndrome: Secondary | ICD-10-CM

## 2018-07-16 DIAGNOSIS — D631 Anemia in chronic kidney disease: Secondary | ICD-10-CM | POA: Diagnosis not present

## 2018-07-16 DIAGNOSIS — N186 End stage renal disease: Secondary | ICD-10-CM | POA: Diagnosis not present

## 2018-07-16 DIAGNOSIS — N2581 Secondary hyperparathyroidism of renal origin: Secondary | ICD-10-CM | POA: Diagnosis not present

## 2018-07-16 DIAGNOSIS — D509 Iron deficiency anemia, unspecified: Secondary | ICD-10-CM | POA: Diagnosis not present

## 2018-07-16 DIAGNOSIS — Z992 Dependence on renal dialysis: Secondary | ICD-10-CM | POA: Diagnosis not present

## 2018-07-16 LAB — CUP PACEART REMOTE DEVICE CHECK
Battery Impedance: 456 Ohm
Battery Remaining Longevity: 93 mo
Battery Voltage: 2.78 V
Brady Statistic AP VP Percent: 1 %
Brady Statistic AP VS Percent: 0 %
Brady Statistic AS VP Percent: 0 %
Brady Statistic AS VS Percent: 98 %
Date Time Interrogation Session: 20200627003215
Implantable Lead Implant Date: 20080604
Implantable Lead Implant Date: 20080604
Implantable Lead Location: 753859
Implantable Lead Location: 753860
Implantable Lead Model: 4469
Implantable Lead Model: 4470
Implantable Lead Serial Number: 498205
Implantable Lead Serial Number: 601713
Implantable Pulse Generator Implant Date: 20130212
Lead Channel Impedance Value: 424 Ohm
Lead Channel Impedance Value: 449 Ohm
Lead Channel Pacing Threshold Amplitude: 0.5 V
Lead Channel Pacing Threshold Amplitude: 0.625 V
Lead Channel Pacing Threshold Pulse Width: 0.4 ms
Lead Channel Pacing Threshold Pulse Width: 0.4 ms
Lead Channel Setting Pacing Amplitude: 2 V
Lead Channel Setting Pacing Amplitude: 2.5 V
Lead Channel Setting Pacing Pulse Width: 0.4 ms
Lead Channel Setting Sensing Sensitivity: 2.8 mV

## 2018-07-19 DIAGNOSIS — N2581 Secondary hyperparathyroidism of renal origin: Secondary | ICD-10-CM | POA: Diagnosis not present

## 2018-07-19 DIAGNOSIS — N186 End stage renal disease: Secondary | ICD-10-CM | POA: Diagnosis not present

## 2018-07-19 DIAGNOSIS — D631 Anemia in chronic kidney disease: Secondary | ICD-10-CM | POA: Diagnosis not present

## 2018-07-19 DIAGNOSIS — Z992 Dependence on renal dialysis: Secondary | ICD-10-CM | POA: Diagnosis not present

## 2018-07-19 DIAGNOSIS — D509 Iron deficiency anemia, unspecified: Secondary | ICD-10-CM | POA: Diagnosis not present

## 2018-07-21 ENCOUNTER — Encounter: Payer: Self-pay | Admitting: Cardiology

## 2018-07-21 DIAGNOSIS — N186 End stage renal disease: Secondary | ICD-10-CM | POA: Diagnosis not present

## 2018-07-21 DIAGNOSIS — N2581 Secondary hyperparathyroidism of renal origin: Secondary | ICD-10-CM | POA: Diagnosis not present

## 2018-07-21 DIAGNOSIS — D631 Anemia in chronic kidney disease: Secondary | ICD-10-CM | POA: Diagnosis not present

## 2018-07-21 DIAGNOSIS — D509 Iron deficiency anemia, unspecified: Secondary | ICD-10-CM | POA: Diagnosis not present

## 2018-07-21 DIAGNOSIS — Z992 Dependence on renal dialysis: Secondary | ICD-10-CM | POA: Diagnosis not present

## 2018-07-21 NOTE — Progress Notes (Signed)
Remote pacemaker transmission.   

## 2018-07-23 DIAGNOSIS — D509 Iron deficiency anemia, unspecified: Secondary | ICD-10-CM | POA: Diagnosis not present

## 2018-07-23 DIAGNOSIS — D631 Anemia in chronic kidney disease: Secondary | ICD-10-CM | POA: Diagnosis not present

## 2018-07-23 DIAGNOSIS — N186 End stage renal disease: Secondary | ICD-10-CM | POA: Diagnosis not present

## 2018-07-23 DIAGNOSIS — N2581 Secondary hyperparathyroidism of renal origin: Secondary | ICD-10-CM | POA: Diagnosis not present

## 2018-07-23 DIAGNOSIS — Z992 Dependence on renal dialysis: Secondary | ICD-10-CM | POA: Diagnosis not present

## 2018-07-26 DIAGNOSIS — Z992 Dependence on renal dialysis: Secondary | ICD-10-CM | POA: Diagnosis not present

## 2018-07-26 DIAGNOSIS — N186 End stage renal disease: Secondary | ICD-10-CM | POA: Diagnosis not present

## 2018-07-26 DIAGNOSIS — D509 Iron deficiency anemia, unspecified: Secondary | ICD-10-CM | POA: Diagnosis not present

## 2018-07-26 DIAGNOSIS — D631 Anemia in chronic kidney disease: Secondary | ICD-10-CM | POA: Diagnosis not present

## 2018-07-26 DIAGNOSIS — N2581 Secondary hyperparathyroidism of renal origin: Secondary | ICD-10-CM | POA: Diagnosis not present

## 2018-07-28 DIAGNOSIS — I10 Essential (primary) hypertension: Secondary | ICD-10-CM | POA: Diagnosis not present

## 2018-07-28 DIAGNOSIS — N185 Chronic kidney disease, stage 5: Secondary | ICD-10-CM | POA: Diagnosis not present

## 2018-07-28 DIAGNOSIS — D631 Anemia in chronic kidney disease: Secondary | ICD-10-CM | POA: Diagnosis not present

## 2018-07-28 DIAGNOSIS — E039 Hypothyroidism, unspecified: Secondary | ICD-10-CM | POA: Diagnosis not present

## 2018-07-28 DIAGNOSIS — Z6825 Body mass index (BMI) 25.0-25.9, adult: Secondary | ICD-10-CM | POA: Diagnosis not present

## 2018-07-28 DIAGNOSIS — I4891 Unspecified atrial fibrillation: Secondary | ICD-10-CM | POA: Diagnosis not present

## 2018-07-28 DIAGNOSIS — Z992 Dependence on renal dialysis: Secondary | ICD-10-CM | POA: Diagnosis not present

## 2018-07-28 DIAGNOSIS — Z299 Encounter for prophylactic measures, unspecified: Secondary | ICD-10-CM | POA: Diagnosis not present

## 2018-07-28 DIAGNOSIS — N2581 Secondary hyperparathyroidism of renal origin: Secondary | ICD-10-CM | POA: Diagnosis not present

## 2018-07-28 DIAGNOSIS — N186 End stage renal disease: Secondary | ICD-10-CM | POA: Diagnosis not present

## 2018-07-28 DIAGNOSIS — D509 Iron deficiency anemia, unspecified: Secondary | ICD-10-CM | POA: Diagnosis not present

## 2018-07-30 DIAGNOSIS — D631 Anemia in chronic kidney disease: Secondary | ICD-10-CM | POA: Diagnosis not present

## 2018-07-30 DIAGNOSIS — Z992 Dependence on renal dialysis: Secondary | ICD-10-CM | POA: Diagnosis not present

## 2018-07-30 DIAGNOSIS — D509 Iron deficiency anemia, unspecified: Secondary | ICD-10-CM | POA: Diagnosis not present

## 2018-07-30 DIAGNOSIS — N2581 Secondary hyperparathyroidism of renal origin: Secondary | ICD-10-CM | POA: Diagnosis not present

## 2018-07-30 DIAGNOSIS — N186 End stage renal disease: Secondary | ICD-10-CM | POA: Diagnosis not present

## 2018-08-02 DIAGNOSIS — Z992 Dependence on renal dialysis: Secondary | ICD-10-CM | POA: Diagnosis not present

## 2018-08-02 DIAGNOSIS — D631 Anemia in chronic kidney disease: Secondary | ICD-10-CM | POA: Diagnosis not present

## 2018-08-02 DIAGNOSIS — N186 End stage renal disease: Secondary | ICD-10-CM | POA: Diagnosis not present

## 2018-08-02 DIAGNOSIS — D509 Iron deficiency anemia, unspecified: Secondary | ICD-10-CM | POA: Diagnosis not present

## 2018-08-02 DIAGNOSIS — N2581 Secondary hyperparathyroidism of renal origin: Secondary | ICD-10-CM | POA: Diagnosis not present

## 2018-08-04 DIAGNOSIS — N186 End stage renal disease: Secondary | ICD-10-CM | POA: Diagnosis not present

## 2018-08-04 DIAGNOSIS — Z992 Dependence on renal dialysis: Secondary | ICD-10-CM | POA: Diagnosis not present

## 2018-08-04 DIAGNOSIS — D631 Anemia in chronic kidney disease: Secondary | ICD-10-CM | POA: Diagnosis not present

## 2018-08-04 DIAGNOSIS — N2581 Secondary hyperparathyroidism of renal origin: Secondary | ICD-10-CM | POA: Diagnosis not present

## 2018-08-04 DIAGNOSIS — D509 Iron deficiency anemia, unspecified: Secondary | ICD-10-CM | POA: Diagnosis not present

## 2018-08-06 DIAGNOSIS — D509 Iron deficiency anemia, unspecified: Secondary | ICD-10-CM | POA: Diagnosis not present

## 2018-08-06 DIAGNOSIS — D631 Anemia in chronic kidney disease: Secondary | ICD-10-CM | POA: Diagnosis not present

## 2018-08-06 DIAGNOSIS — N186 End stage renal disease: Secondary | ICD-10-CM | POA: Diagnosis not present

## 2018-08-06 DIAGNOSIS — Z992 Dependence on renal dialysis: Secondary | ICD-10-CM | POA: Diagnosis not present

## 2018-08-06 DIAGNOSIS — N2581 Secondary hyperparathyroidism of renal origin: Secondary | ICD-10-CM | POA: Diagnosis not present

## 2018-08-09 DIAGNOSIS — Z992 Dependence on renal dialysis: Secondary | ICD-10-CM | POA: Diagnosis not present

## 2018-08-09 DIAGNOSIS — D509 Iron deficiency anemia, unspecified: Secondary | ICD-10-CM | POA: Diagnosis not present

## 2018-08-09 DIAGNOSIS — N2581 Secondary hyperparathyroidism of renal origin: Secondary | ICD-10-CM | POA: Diagnosis not present

## 2018-08-09 DIAGNOSIS — N186 End stage renal disease: Secondary | ICD-10-CM | POA: Diagnosis not present

## 2018-08-09 DIAGNOSIS — D631 Anemia in chronic kidney disease: Secondary | ICD-10-CM | POA: Diagnosis not present

## 2018-08-11 DIAGNOSIS — Z992 Dependence on renal dialysis: Secondary | ICD-10-CM | POA: Diagnosis not present

## 2018-08-11 DIAGNOSIS — N2581 Secondary hyperparathyroidism of renal origin: Secondary | ICD-10-CM | POA: Diagnosis not present

## 2018-08-11 DIAGNOSIS — D509 Iron deficiency anemia, unspecified: Secondary | ICD-10-CM | POA: Diagnosis not present

## 2018-08-11 DIAGNOSIS — D631 Anemia in chronic kidney disease: Secondary | ICD-10-CM | POA: Diagnosis not present

## 2018-08-11 DIAGNOSIS — N186 End stage renal disease: Secondary | ICD-10-CM | POA: Diagnosis not present

## 2018-08-13 DIAGNOSIS — D509 Iron deficiency anemia, unspecified: Secondary | ICD-10-CM | POA: Diagnosis not present

## 2018-08-13 DIAGNOSIS — N2581 Secondary hyperparathyroidism of renal origin: Secondary | ICD-10-CM | POA: Diagnosis not present

## 2018-08-13 DIAGNOSIS — D631 Anemia in chronic kidney disease: Secondary | ICD-10-CM | POA: Diagnosis not present

## 2018-08-13 DIAGNOSIS — N186 End stage renal disease: Secondary | ICD-10-CM | POA: Diagnosis not present

## 2018-08-13 DIAGNOSIS — Z992 Dependence on renal dialysis: Secondary | ICD-10-CM | POA: Diagnosis not present

## 2018-08-16 DIAGNOSIS — N186 End stage renal disease: Secondary | ICD-10-CM | POA: Diagnosis not present

## 2018-08-16 DIAGNOSIS — Z992 Dependence on renal dialysis: Secondary | ICD-10-CM | POA: Diagnosis not present

## 2018-08-16 DIAGNOSIS — D509 Iron deficiency anemia, unspecified: Secondary | ICD-10-CM | POA: Diagnosis not present

## 2018-08-16 DIAGNOSIS — N2581 Secondary hyperparathyroidism of renal origin: Secondary | ICD-10-CM | POA: Diagnosis not present

## 2018-08-16 DIAGNOSIS — D631 Anemia in chronic kidney disease: Secondary | ICD-10-CM | POA: Diagnosis not present

## 2018-08-18 DIAGNOSIS — N186 End stage renal disease: Secondary | ICD-10-CM | POA: Diagnosis not present

## 2018-08-18 DIAGNOSIS — N2581 Secondary hyperparathyroidism of renal origin: Secondary | ICD-10-CM | POA: Diagnosis not present

## 2018-08-18 DIAGNOSIS — D631 Anemia in chronic kidney disease: Secondary | ICD-10-CM | POA: Diagnosis not present

## 2018-08-18 DIAGNOSIS — Z992 Dependence on renal dialysis: Secondary | ICD-10-CM | POA: Diagnosis not present

## 2018-08-18 DIAGNOSIS — D509 Iron deficiency anemia, unspecified: Secondary | ICD-10-CM | POA: Diagnosis not present

## 2018-08-19 DIAGNOSIS — Z992 Dependence on renal dialysis: Secondary | ICD-10-CM | POA: Diagnosis not present

## 2018-08-19 DIAGNOSIS — N186 End stage renal disease: Secondary | ICD-10-CM | POA: Diagnosis not present

## 2018-08-20 DIAGNOSIS — N186 End stage renal disease: Secondary | ICD-10-CM | POA: Diagnosis not present

## 2018-08-20 DIAGNOSIS — D509 Iron deficiency anemia, unspecified: Secondary | ICD-10-CM | POA: Diagnosis not present

## 2018-08-20 DIAGNOSIS — D631 Anemia in chronic kidney disease: Secondary | ICD-10-CM | POA: Diagnosis not present

## 2018-08-20 DIAGNOSIS — Z992 Dependence on renal dialysis: Secondary | ICD-10-CM | POA: Diagnosis not present

## 2018-08-20 DIAGNOSIS — N2581 Secondary hyperparathyroidism of renal origin: Secondary | ICD-10-CM | POA: Diagnosis not present

## 2018-08-23 DIAGNOSIS — D631 Anemia in chronic kidney disease: Secondary | ICD-10-CM | POA: Diagnosis not present

## 2018-08-23 DIAGNOSIS — D509 Iron deficiency anemia, unspecified: Secondary | ICD-10-CM | POA: Diagnosis not present

## 2018-08-23 DIAGNOSIS — Z992 Dependence on renal dialysis: Secondary | ICD-10-CM | POA: Diagnosis not present

## 2018-08-23 DIAGNOSIS — N186 End stage renal disease: Secondary | ICD-10-CM | POA: Diagnosis not present

## 2018-08-23 DIAGNOSIS — N2581 Secondary hyperparathyroidism of renal origin: Secondary | ICD-10-CM | POA: Diagnosis not present

## 2018-08-25 DIAGNOSIS — Z992 Dependence on renal dialysis: Secondary | ICD-10-CM | POA: Diagnosis not present

## 2018-08-25 DIAGNOSIS — D631 Anemia in chronic kidney disease: Secondary | ICD-10-CM | POA: Diagnosis not present

## 2018-08-25 DIAGNOSIS — N185 Chronic kidney disease, stage 5: Secondary | ICD-10-CM | POA: Diagnosis not present

## 2018-08-25 DIAGNOSIS — M549 Dorsalgia, unspecified: Secondary | ICD-10-CM | POA: Diagnosis not present

## 2018-08-25 DIAGNOSIS — N186 End stage renal disease: Secondary | ICD-10-CM | POA: Diagnosis not present

## 2018-08-25 DIAGNOSIS — Z6825 Body mass index (BMI) 25.0-25.9, adult: Secondary | ICD-10-CM | POA: Diagnosis not present

## 2018-08-25 DIAGNOSIS — I10 Essential (primary) hypertension: Secondary | ICD-10-CM | POA: Diagnosis not present

## 2018-08-25 DIAGNOSIS — Z299 Encounter for prophylactic measures, unspecified: Secondary | ICD-10-CM | POA: Diagnosis not present

## 2018-08-25 DIAGNOSIS — D509 Iron deficiency anemia, unspecified: Secondary | ICD-10-CM | POA: Diagnosis not present

## 2018-08-25 DIAGNOSIS — N2581 Secondary hyperparathyroidism of renal origin: Secondary | ICD-10-CM | POA: Diagnosis not present

## 2018-08-25 DIAGNOSIS — I4891 Unspecified atrial fibrillation: Secondary | ICD-10-CM | POA: Diagnosis not present

## 2018-08-27 DIAGNOSIS — D631 Anemia in chronic kidney disease: Secondary | ICD-10-CM | POA: Diagnosis not present

## 2018-08-27 DIAGNOSIS — N186 End stage renal disease: Secondary | ICD-10-CM | POA: Diagnosis not present

## 2018-08-27 DIAGNOSIS — D509 Iron deficiency anemia, unspecified: Secondary | ICD-10-CM | POA: Diagnosis not present

## 2018-08-27 DIAGNOSIS — N2581 Secondary hyperparathyroidism of renal origin: Secondary | ICD-10-CM | POA: Diagnosis not present

## 2018-08-27 DIAGNOSIS — Z992 Dependence on renal dialysis: Secondary | ICD-10-CM | POA: Diagnosis not present

## 2018-08-30 DIAGNOSIS — N186 End stage renal disease: Secondary | ICD-10-CM | POA: Diagnosis not present

## 2018-08-30 DIAGNOSIS — N2581 Secondary hyperparathyroidism of renal origin: Secondary | ICD-10-CM | POA: Diagnosis not present

## 2018-08-30 DIAGNOSIS — D631 Anemia in chronic kidney disease: Secondary | ICD-10-CM | POA: Diagnosis not present

## 2018-08-30 DIAGNOSIS — Z992 Dependence on renal dialysis: Secondary | ICD-10-CM | POA: Diagnosis not present

## 2018-08-30 DIAGNOSIS — D509 Iron deficiency anemia, unspecified: Secondary | ICD-10-CM | POA: Diagnosis not present

## 2018-09-01 DIAGNOSIS — N2581 Secondary hyperparathyroidism of renal origin: Secondary | ICD-10-CM | POA: Diagnosis not present

## 2018-09-01 DIAGNOSIS — D509 Iron deficiency anemia, unspecified: Secondary | ICD-10-CM | POA: Diagnosis not present

## 2018-09-01 DIAGNOSIS — N186 End stage renal disease: Secondary | ICD-10-CM | POA: Diagnosis not present

## 2018-09-01 DIAGNOSIS — Z992 Dependence on renal dialysis: Secondary | ICD-10-CM | POA: Diagnosis not present

## 2018-09-01 DIAGNOSIS — D631 Anemia in chronic kidney disease: Secondary | ICD-10-CM | POA: Diagnosis not present

## 2018-09-03 DIAGNOSIS — D631 Anemia in chronic kidney disease: Secondary | ICD-10-CM | POA: Diagnosis not present

## 2018-09-03 DIAGNOSIS — Z992 Dependence on renal dialysis: Secondary | ICD-10-CM | POA: Diagnosis not present

## 2018-09-03 DIAGNOSIS — D509 Iron deficiency anemia, unspecified: Secondary | ICD-10-CM | POA: Diagnosis not present

## 2018-09-03 DIAGNOSIS — N2581 Secondary hyperparathyroidism of renal origin: Secondary | ICD-10-CM | POA: Diagnosis not present

## 2018-09-03 DIAGNOSIS — N186 End stage renal disease: Secondary | ICD-10-CM | POA: Diagnosis not present

## 2018-09-06 DIAGNOSIS — Z992 Dependence on renal dialysis: Secondary | ICD-10-CM | POA: Diagnosis not present

## 2018-09-06 DIAGNOSIS — N2581 Secondary hyperparathyroidism of renal origin: Secondary | ICD-10-CM | POA: Diagnosis not present

## 2018-09-06 DIAGNOSIS — D631 Anemia in chronic kidney disease: Secondary | ICD-10-CM | POA: Diagnosis not present

## 2018-09-06 DIAGNOSIS — D509 Iron deficiency anemia, unspecified: Secondary | ICD-10-CM | POA: Diagnosis not present

## 2018-09-06 DIAGNOSIS — N186 End stage renal disease: Secondary | ICD-10-CM | POA: Diagnosis not present

## 2018-09-08 DIAGNOSIS — N186 End stage renal disease: Secondary | ICD-10-CM | POA: Diagnosis not present

## 2018-09-08 DIAGNOSIS — Z992 Dependence on renal dialysis: Secondary | ICD-10-CM | POA: Diagnosis not present

## 2018-09-08 DIAGNOSIS — D631 Anemia in chronic kidney disease: Secondary | ICD-10-CM | POA: Diagnosis not present

## 2018-09-08 DIAGNOSIS — N2581 Secondary hyperparathyroidism of renal origin: Secondary | ICD-10-CM | POA: Diagnosis not present

## 2018-09-08 DIAGNOSIS — D509 Iron deficiency anemia, unspecified: Secondary | ICD-10-CM | POA: Diagnosis not present

## 2018-09-10 DIAGNOSIS — Z992 Dependence on renal dialysis: Secondary | ICD-10-CM | POA: Diagnosis not present

## 2018-09-10 DIAGNOSIS — N186 End stage renal disease: Secondary | ICD-10-CM | POA: Diagnosis not present

## 2018-09-10 DIAGNOSIS — N2581 Secondary hyperparathyroidism of renal origin: Secondary | ICD-10-CM | POA: Diagnosis not present

## 2018-09-10 DIAGNOSIS — D509 Iron deficiency anemia, unspecified: Secondary | ICD-10-CM | POA: Diagnosis not present

## 2018-09-10 DIAGNOSIS — D631 Anemia in chronic kidney disease: Secondary | ICD-10-CM | POA: Diagnosis not present

## 2018-09-10 IMAGING — DX DG CHEST 1V PORT
1 series · 1 of 1 positions shown · non-contrast
Comparison: 05/21/2016 .

CLINICAL DATA: Dialysis catheter placement.

EXAM:
PORTABLE CHEST 1 VIEW

[chest ap]
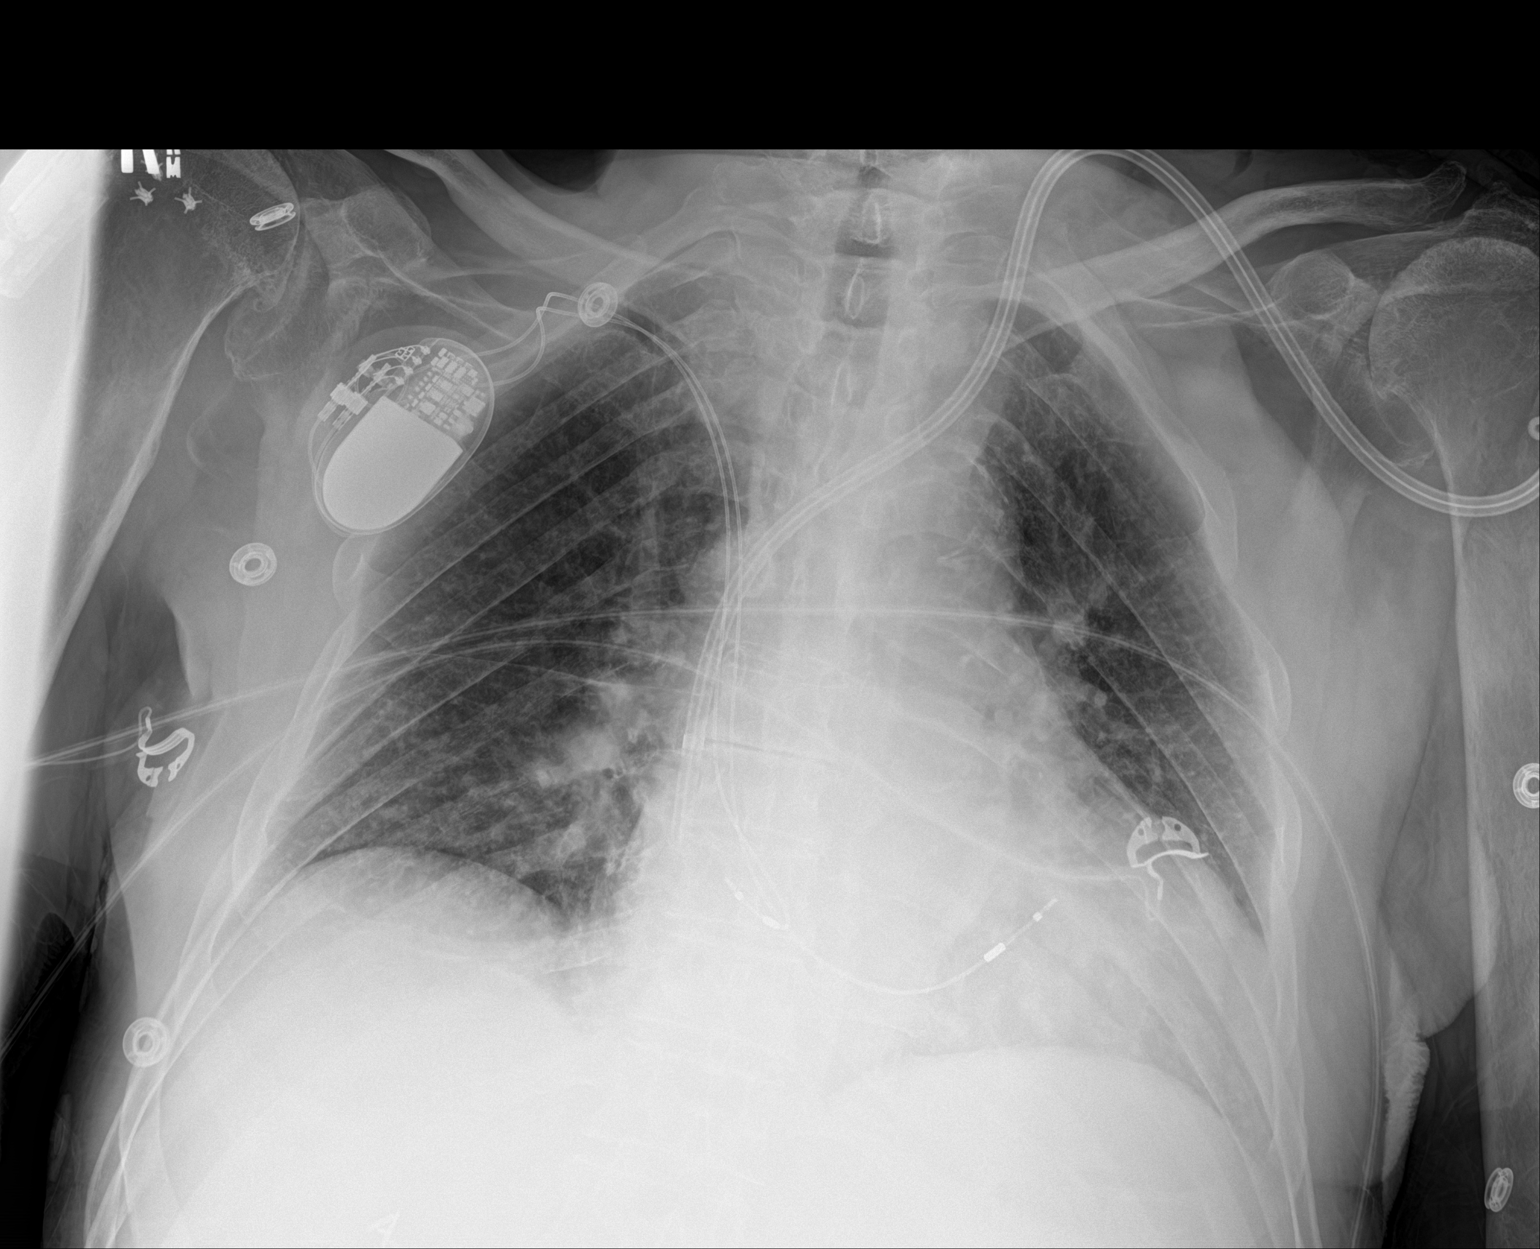

[1 of 1 positions shown; findings below may reference images not displayed]

FINDINGS: Left IJ dual-lumen catheter with tip projected over right atrium.
Cardiac pacer no with lead tips in right atrium right ventricle.
Cardiomegaly with pulmonary vascular prominence. No focal
infiltrate. No pleural effusion pneumothorax. Postsurgical changes
right shoulder.
IMPRESSION: 1. Interim placement of left IJ dual-lumen catheter with tip
projected over the right atrium. No pneumothorax.

2. Cardiac pacer in stable position. Stable cardiomegaly with mild
pulmonary venous congestion.

## 2018-09-13 DIAGNOSIS — N186 End stage renal disease: Secondary | ICD-10-CM | POA: Diagnosis not present

## 2018-09-13 DIAGNOSIS — Z992 Dependence on renal dialysis: Secondary | ICD-10-CM | POA: Diagnosis not present

## 2018-09-13 DIAGNOSIS — D631 Anemia in chronic kidney disease: Secondary | ICD-10-CM | POA: Diagnosis not present

## 2018-09-13 DIAGNOSIS — D509 Iron deficiency anemia, unspecified: Secondary | ICD-10-CM | POA: Diagnosis not present

## 2018-09-13 DIAGNOSIS — N2581 Secondary hyperparathyroidism of renal origin: Secondary | ICD-10-CM | POA: Diagnosis not present

## 2018-09-15 DIAGNOSIS — N2581 Secondary hyperparathyroidism of renal origin: Secondary | ICD-10-CM | POA: Diagnosis not present

## 2018-09-15 DIAGNOSIS — Z992 Dependence on renal dialysis: Secondary | ICD-10-CM | POA: Diagnosis not present

## 2018-09-15 DIAGNOSIS — D509 Iron deficiency anemia, unspecified: Secondary | ICD-10-CM | POA: Diagnosis not present

## 2018-09-15 DIAGNOSIS — N186 End stage renal disease: Secondary | ICD-10-CM | POA: Diagnosis not present

## 2018-09-15 DIAGNOSIS — D631 Anemia in chronic kidney disease: Secondary | ICD-10-CM | POA: Diagnosis not present

## 2018-09-17 DIAGNOSIS — N2581 Secondary hyperparathyroidism of renal origin: Secondary | ICD-10-CM | POA: Diagnosis not present

## 2018-09-17 DIAGNOSIS — D631 Anemia in chronic kidney disease: Secondary | ICD-10-CM | POA: Diagnosis not present

## 2018-09-17 DIAGNOSIS — D509 Iron deficiency anemia, unspecified: Secondary | ICD-10-CM | POA: Diagnosis not present

## 2018-09-17 DIAGNOSIS — N186 End stage renal disease: Secondary | ICD-10-CM | POA: Diagnosis not present

## 2018-09-17 DIAGNOSIS — Z992 Dependence on renal dialysis: Secondary | ICD-10-CM | POA: Diagnosis not present

## 2018-09-19 DIAGNOSIS — N186 End stage renal disease: Secondary | ICD-10-CM | POA: Diagnosis not present

## 2018-09-19 DIAGNOSIS — Z992 Dependence on renal dialysis: Secondary | ICD-10-CM | POA: Diagnosis not present

## 2018-09-20 DIAGNOSIS — N186 End stage renal disease: Secondary | ICD-10-CM | POA: Diagnosis not present

## 2018-09-20 DIAGNOSIS — N2581 Secondary hyperparathyroidism of renal origin: Secondary | ICD-10-CM | POA: Diagnosis not present

## 2018-09-20 DIAGNOSIS — Z23 Encounter for immunization: Secondary | ICD-10-CM | POA: Diagnosis not present

## 2018-09-20 DIAGNOSIS — D509 Iron deficiency anemia, unspecified: Secondary | ICD-10-CM | POA: Diagnosis not present

## 2018-09-20 DIAGNOSIS — Z992 Dependence on renal dialysis: Secondary | ICD-10-CM | POA: Diagnosis not present

## 2018-09-20 DIAGNOSIS — D631 Anemia in chronic kidney disease: Secondary | ICD-10-CM | POA: Diagnosis not present

## 2018-09-22 DIAGNOSIS — N2581 Secondary hyperparathyroidism of renal origin: Secondary | ICD-10-CM | POA: Diagnosis not present

## 2018-09-22 DIAGNOSIS — D631 Anemia in chronic kidney disease: Secondary | ICD-10-CM | POA: Diagnosis not present

## 2018-09-22 DIAGNOSIS — N186 End stage renal disease: Secondary | ICD-10-CM | POA: Diagnosis not present

## 2018-09-22 DIAGNOSIS — D509 Iron deficiency anemia, unspecified: Secondary | ICD-10-CM | POA: Diagnosis not present

## 2018-09-22 DIAGNOSIS — Z992 Dependence on renal dialysis: Secondary | ICD-10-CM | POA: Diagnosis not present

## 2018-09-22 DIAGNOSIS — Z23 Encounter for immunization: Secondary | ICD-10-CM | POA: Diagnosis not present

## 2018-09-24 DIAGNOSIS — D509 Iron deficiency anemia, unspecified: Secondary | ICD-10-CM | POA: Diagnosis not present

## 2018-09-24 DIAGNOSIS — N2581 Secondary hyperparathyroidism of renal origin: Secondary | ICD-10-CM | POA: Diagnosis not present

## 2018-09-24 DIAGNOSIS — Z992 Dependence on renal dialysis: Secondary | ICD-10-CM | POA: Diagnosis not present

## 2018-09-24 DIAGNOSIS — D631 Anemia in chronic kidney disease: Secondary | ICD-10-CM | POA: Diagnosis not present

## 2018-09-24 DIAGNOSIS — N186 End stage renal disease: Secondary | ICD-10-CM | POA: Diagnosis not present

## 2018-09-24 DIAGNOSIS — Z23 Encounter for immunization: Secondary | ICD-10-CM | POA: Diagnosis not present

## 2018-09-27 DIAGNOSIS — N186 End stage renal disease: Secondary | ICD-10-CM | POA: Diagnosis not present

## 2018-09-27 DIAGNOSIS — Z992 Dependence on renal dialysis: Secondary | ICD-10-CM | POA: Diagnosis not present

## 2018-09-27 DIAGNOSIS — D509 Iron deficiency anemia, unspecified: Secondary | ICD-10-CM | POA: Diagnosis not present

## 2018-09-27 DIAGNOSIS — N2581 Secondary hyperparathyroidism of renal origin: Secondary | ICD-10-CM | POA: Diagnosis not present

## 2018-09-27 DIAGNOSIS — Z23 Encounter for immunization: Secondary | ICD-10-CM | POA: Diagnosis not present

## 2018-09-27 DIAGNOSIS — D631 Anemia in chronic kidney disease: Secondary | ICD-10-CM | POA: Diagnosis not present

## 2018-09-29 DIAGNOSIS — N186 End stage renal disease: Secondary | ICD-10-CM | POA: Diagnosis not present

## 2018-09-29 DIAGNOSIS — D509 Iron deficiency anemia, unspecified: Secondary | ICD-10-CM | POA: Diagnosis not present

## 2018-09-29 DIAGNOSIS — Z992 Dependence on renal dialysis: Secondary | ICD-10-CM | POA: Diagnosis not present

## 2018-09-29 DIAGNOSIS — Z23 Encounter for immunization: Secondary | ICD-10-CM | POA: Diagnosis not present

## 2018-09-29 DIAGNOSIS — N2581 Secondary hyperparathyroidism of renal origin: Secondary | ICD-10-CM | POA: Diagnosis not present

## 2018-09-29 DIAGNOSIS — D631 Anemia in chronic kidney disease: Secondary | ICD-10-CM | POA: Diagnosis not present

## 2018-10-01 DIAGNOSIS — N2581 Secondary hyperparathyroidism of renal origin: Secondary | ICD-10-CM | POA: Diagnosis not present

## 2018-10-01 DIAGNOSIS — N186 End stage renal disease: Secondary | ICD-10-CM | POA: Diagnosis not present

## 2018-10-01 DIAGNOSIS — D631 Anemia in chronic kidney disease: Secondary | ICD-10-CM | POA: Diagnosis not present

## 2018-10-01 DIAGNOSIS — Z23 Encounter for immunization: Secondary | ICD-10-CM | POA: Diagnosis not present

## 2018-10-01 DIAGNOSIS — D509 Iron deficiency anemia, unspecified: Secondary | ICD-10-CM | POA: Diagnosis not present

## 2018-10-01 DIAGNOSIS — Z992 Dependence on renal dialysis: Secondary | ICD-10-CM | POA: Diagnosis not present

## 2018-10-04 DIAGNOSIS — N186 End stage renal disease: Secondary | ICD-10-CM | POA: Diagnosis not present

## 2018-10-04 DIAGNOSIS — Z23 Encounter for immunization: Secondary | ICD-10-CM | POA: Diagnosis not present

## 2018-10-04 DIAGNOSIS — N2581 Secondary hyperparathyroidism of renal origin: Secondary | ICD-10-CM | POA: Diagnosis not present

## 2018-10-04 DIAGNOSIS — Z992 Dependence on renal dialysis: Secondary | ICD-10-CM | POA: Diagnosis not present

## 2018-10-04 DIAGNOSIS — D631 Anemia in chronic kidney disease: Secondary | ICD-10-CM | POA: Diagnosis not present

## 2018-10-04 DIAGNOSIS — D509 Iron deficiency anemia, unspecified: Secondary | ICD-10-CM | POA: Diagnosis not present

## 2018-10-06 DIAGNOSIS — N2581 Secondary hyperparathyroidism of renal origin: Secondary | ICD-10-CM | POA: Diagnosis not present

## 2018-10-06 DIAGNOSIS — D631 Anemia in chronic kidney disease: Secondary | ICD-10-CM | POA: Diagnosis not present

## 2018-10-06 DIAGNOSIS — Z992 Dependence on renal dialysis: Secondary | ICD-10-CM | POA: Diagnosis not present

## 2018-10-06 DIAGNOSIS — N186 End stage renal disease: Secondary | ICD-10-CM | POA: Diagnosis not present

## 2018-10-06 DIAGNOSIS — D509 Iron deficiency anemia, unspecified: Secondary | ICD-10-CM | POA: Diagnosis not present

## 2018-10-06 DIAGNOSIS — Z23 Encounter for immunization: Secondary | ICD-10-CM | POA: Diagnosis not present

## 2018-10-08 DIAGNOSIS — D631 Anemia in chronic kidney disease: Secondary | ICD-10-CM | POA: Diagnosis not present

## 2018-10-08 DIAGNOSIS — N186 End stage renal disease: Secondary | ICD-10-CM | POA: Diagnosis not present

## 2018-10-08 DIAGNOSIS — N2581 Secondary hyperparathyroidism of renal origin: Secondary | ICD-10-CM | POA: Diagnosis not present

## 2018-10-08 DIAGNOSIS — Z23 Encounter for immunization: Secondary | ICD-10-CM | POA: Diagnosis not present

## 2018-10-08 DIAGNOSIS — D509 Iron deficiency anemia, unspecified: Secondary | ICD-10-CM | POA: Diagnosis not present

## 2018-10-08 DIAGNOSIS — Z992 Dependence on renal dialysis: Secondary | ICD-10-CM | POA: Diagnosis not present

## 2018-10-11 DIAGNOSIS — N2581 Secondary hyperparathyroidism of renal origin: Secondary | ICD-10-CM | POA: Diagnosis not present

## 2018-10-11 DIAGNOSIS — Z7189 Other specified counseling: Secondary | ICD-10-CM | POA: Diagnosis not present

## 2018-10-11 DIAGNOSIS — D509 Iron deficiency anemia, unspecified: Secondary | ICD-10-CM | POA: Diagnosis not present

## 2018-10-11 DIAGNOSIS — Z299 Encounter for prophylactic measures, unspecified: Secondary | ICD-10-CM | POA: Diagnosis not present

## 2018-10-11 DIAGNOSIS — R5383 Other fatigue: Secondary | ICD-10-CM | POA: Diagnosis not present

## 2018-10-11 DIAGNOSIS — N186 End stage renal disease: Secondary | ICD-10-CM | POA: Diagnosis not present

## 2018-10-11 DIAGNOSIS — E7800 Pure hypercholesterolemia, unspecified: Secondary | ICD-10-CM | POA: Diagnosis not present

## 2018-10-11 DIAGNOSIS — D631 Anemia in chronic kidney disease: Secondary | ICD-10-CM | POA: Diagnosis not present

## 2018-10-11 DIAGNOSIS — Z1339 Encounter for screening examination for other mental health and behavioral disorders: Secondary | ICD-10-CM | POA: Diagnosis not present

## 2018-10-11 DIAGNOSIS — Z1331 Encounter for screening for depression: Secondary | ICD-10-CM | POA: Diagnosis not present

## 2018-10-11 DIAGNOSIS — Z992 Dependence on renal dialysis: Secondary | ICD-10-CM | POA: Diagnosis not present

## 2018-10-11 DIAGNOSIS — Z23 Encounter for immunization: Secondary | ICD-10-CM | POA: Diagnosis not present

## 2018-10-11 DIAGNOSIS — Z0000 Encounter for general adult medical examination without abnormal findings: Secondary | ICD-10-CM | POA: Diagnosis not present

## 2018-10-11 DIAGNOSIS — I10 Essential (primary) hypertension: Secondary | ICD-10-CM | POA: Diagnosis not present

## 2018-10-11 DIAGNOSIS — E039 Hypothyroidism, unspecified: Secondary | ICD-10-CM | POA: Diagnosis not present

## 2018-10-12 DIAGNOSIS — E7800 Pure hypercholesterolemia, unspecified: Secondary | ICD-10-CM | POA: Diagnosis not present

## 2018-10-12 DIAGNOSIS — Z125 Encounter for screening for malignant neoplasm of prostate: Secondary | ICD-10-CM | POA: Diagnosis not present

## 2018-10-12 DIAGNOSIS — R5383 Other fatigue: Secondary | ICD-10-CM | POA: Diagnosis not present

## 2018-10-12 DIAGNOSIS — E039 Hypothyroidism, unspecified: Secondary | ICD-10-CM | POA: Diagnosis not present

## 2018-10-12 DIAGNOSIS — Z79899 Other long term (current) drug therapy: Secondary | ICD-10-CM | POA: Diagnosis not present

## 2018-10-13 DIAGNOSIS — N2581 Secondary hyperparathyroidism of renal origin: Secondary | ICD-10-CM | POA: Diagnosis not present

## 2018-10-13 DIAGNOSIS — Z992 Dependence on renal dialysis: Secondary | ICD-10-CM | POA: Diagnosis not present

## 2018-10-13 DIAGNOSIS — D631 Anemia in chronic kidney disease: Secondary | ICD-10-CM | POA: Diagnosis not present

## 2018-10-13 DIAGNOSIS — D509 Iron deficiency anemia, unspecified: Secondary | ICD-10-CM | POA: Diagnosis not present

## 2018-10-13 DIAGNOSIS — Z23 Encounter for immunization: Secondary | ICD-10-CM | POA: Diagnosis not present

## 2018-10-13 DIAGNOSIS — N186 End stage renal disease: Secondary | ICD-10-CM | POA: Diagnosis not present

## 2018-10-14 ENCOUNTER — Ambulatory Visit (INDEPENDENT_AMBULATORY_CARE_PROVIDER_SITE_OTHER): Payer: Medicare Other | Admitting: *Deleted

## 2018-10-14 DIAGNOSIS — I495 Sick sinus syndrome: Secondary | ICD-10-CM

## 2018-10-14 LAB — CUP PACEART REMOTE DEVICE CHECK
Battery Impedance: 483 Ohm
Battery Remaining Longevity: 90 mo
Battery Voltage: 2.79 V
Brady Statistic AP VP Percent: 1 %
Brady Statistic AP VS Percent: 0 %
Brady Statistic AS VP Percent: 0 %
Brady Statistic AS VS Percent: 98 %
Date Time Interrogation Session: 20200925143626
Implantable Lead Implant Date: 20080604
Implantable Lead Implant Date: 20080604
Implantable Lead Location: 753859
Implantable Lead Location: 753860
Implantable Lead Model: 4469
Implantable Lead Model: 4470
Implantable Lead Serial Number: 498205
Implantable Lead Serial Number: 601713
Implantable Pulse Generator Implant Date: 20130212
Lead Channel Impedance Value: 448 Ohm
Lead Channel Impedance Value: 485 Ohm
Lead Channel Pacing Threshold Amplitude: 0.5 V
Lead Channel Pacing Threshold Amplitude: 0.625 V
Lead Channel Pacing Threshold Pulse Width: 0.4 ms
Lead Channel Pacing Threshold Pulse Width: 0.4 ms
Lead Channel Setting Pacing Amplitude: 2 V
Lead Channel Setting Pacing Amplitude: 2.5 V
Lead Channel Setting Pacing Pulse Width: 0.4 ms
Lead Channel Setting Sensing Sensitivity: 4 mV

## 2018-10-15 DIAGNOSIS — Z992 Dependence on renal dialysis: Secondary | ICD-10-CM | POA: Diagnosis not present

## 2018-10-15 DIAGNOSIS — D509 Iron deficiency anemia, unspecified: Secondary | ICD-10-CM | POA: Diagnosis not present

## 2018-10-15 DIAGNOSIS — D631 Anemia in chronic kidney disease: Secondary | ICD-10-CM | POA: Diagnosis not present

## 2018-10-15 DIAGNOSIS — N2581 Secondary hyperparathyroidism of renal origin: Secondary | ICD-10-CM | POA: Diagnosis not present

## 2018-10-15 DIAGNOSIS — Z23 Encounter for immunization: Secondary | ICD-10-CM | POA: Diagnosis not present

## 2018-10-15 DIAGNOSIS — N186 End stage renal disease: Secondary | ICD-10-CM | POA: Diagnosis not present

## 2018-10-18 ENCOUNTER — Encounter: Payer: Self-pay | Admitting: Cardiology

## 2018-10-18 DIAGNOSIS — Z23 Encounter for immunization: Secondary | ICD-10-CM | POA: Diagnosis not present

## 2018-10-18 DIAGNOSIS — Z992 Dependence on renal dialysis: Secondary | ICD-10-CM | POA: Diagnosis not present

## 2018-10-18 DIAGNOSIS — D509 Iron deficiency anemia, unspecified: Secondary | ICD-10-CM | POA: Diagnosis not present

## 2018-10-18 DIAGNOSIS — N2581 Secondary hyperparathyroidism of renal origin: Secondary | ICD-10-CM | POA: Diagnosis not present

## 2018-10-18 DIAGNOSIS — D631 Anemia in chronic kidney disease: Secondary | ICD-10-CM | POA: Diagnosis not present

## 2018-10-18 DIAGNOSIS — N186 End stage renal disease: Secondary | ICD-10-CM | POA: Diagnosis not present

## 2018-10-18 NOTE — Progress Notes (Signed)
Remote pacemaker transmission.   

## 2018-10-19 DIAGNOSIS — N186 End stage renal disease: Secondary | ICD-10-CM | POA: Diagnosis not present

## 2018-10-19 DIAGNOSIS — Z992 Dependence on renal dialysis: Secondary | ICD-10-CM | POA: Diagnosis not present

## 2018-10-20 DIAGNOSIS — D631 Anemia in chronic kidney disease: Secondary | ICD-10-CM | POA: Diagnosis not present

## 2018-10-20 DIAGNOSIS — Z992 Dependence on renal dialysis: Secondary | ICD-10-CM | POA: Diagnosis not present

## 2018-10-20 DIAGNOSIS — N2581 Secondary hyperparathyroidism of renal origin: Secondary | ICD-10-CM | POA: Diagnosis not present

## 2018-10-20 DIAGNOSIS — D509 Iron deficiency anemia, unspecified: Secondary | ICD-10-CM | POA: Diagnosis not present

## 2018-10-20 DIAGNOSIS — N186 End stage renal disease: Secondary | ICD-10-CM | POA: Diagnosis not present

## 2018-10-22 DIAGNOSIS — D509 Iron deficiency anemia, unspecified: Secondary | ICD-10-CM | POA: Diagnosis not present

## 2018-10-22 DIAGNOSIS — Z992 Dependence on renal dialysis: Secondary | ICD-10-CM | POA: Diagnosis not present

## 2018-10-22 DIAGNOSIS — D631 Anemia in chronic kidney disease: Secondary | ICD-10-CM | POA: Diagnosis not present

## 2018-10-22 DIAGNOSIS — N2581 Secondary hyperparathyroidism of renal origin: Secondary | ICD-10-CM | POA: Diagnosis not present

## 2018-10-22 DIAGNOSIS — N186 End stage renal disease: Secondary | ICD-10-CM | POA: Diagnosis not present

## 2018-10-23 IMAGING — XA IR FLUORO GUIDE CV LINE*R*
1 series · 2 of 2 positions shown · non-contrast
Comparison: none

INDICATION: Poorly functional left jugular dialysis catheterization

[Series 300: dsa body · 2 of 2 slices shown]
[im 1/2]
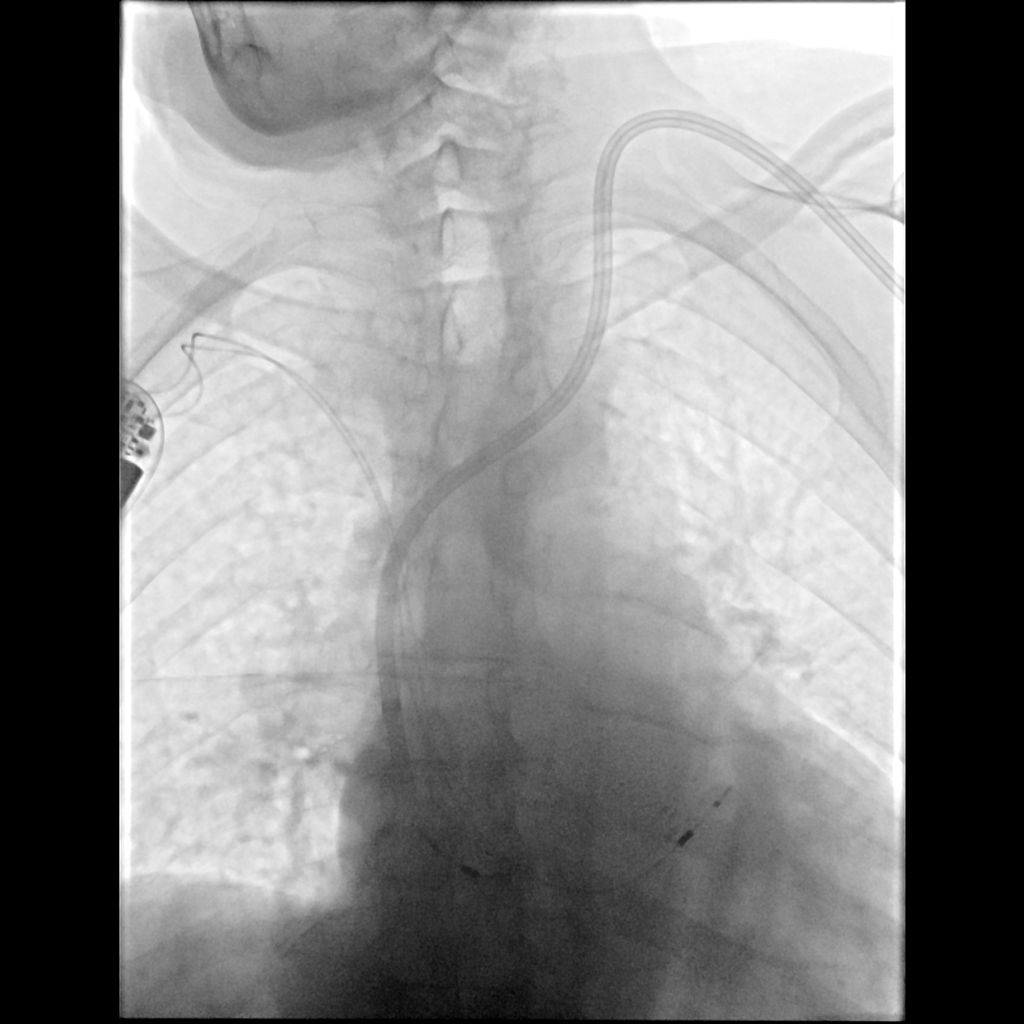
[im 2/2]
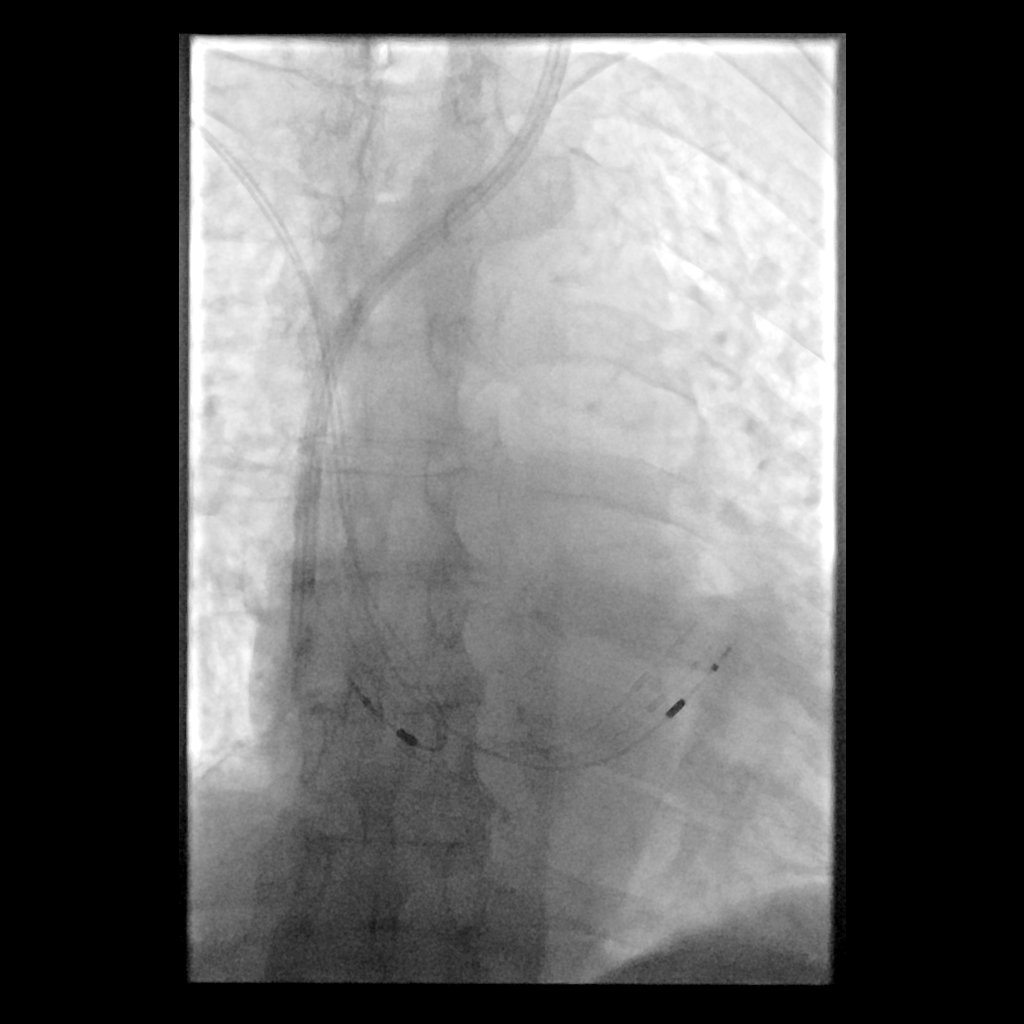

[2 of 2 positions shown; findings below may reference images not displayed]

EXAM:
LEFT JUGULAR TUNNELED DIALYSIS CATHETER EXCHANGE

MEDICATIONS:
Ancef 2 g; The antibiotic was administered within an appropriate
time interval prior to skin puncture.

ANESTHESIA/SEDATION:
None

FLUOROSCOPY TIME:  Fluoroscopy Time:  minutes 24 seconds (2 mGy).

COMPLICATIONS:
None immediate.

PROCEDURE:
Informed written consent was obtained from the patient after a
thorough discussion of the procedural risks, benefits and
alternatives. All questions were addressed. Maximal Sterile Barrier
Technique was utilized including caps, mask, sterile gowns, sterile
gloves, sterile drape, hand hygiene and skin antiseptic. A timeout
was performed prior to the initiation of the procedure.

The left neck was prepped and draped in a sterile fashion. 1%
lidocaine was utilized for local anesthesia. The cuff of the
existing catheter was freed from the subcutaneous tissue utilizing
blunt dissection. The catheter was exchanged over a stiff glidewire
for a new 28 French tunneled dialysis catheter. It was flushed and
sewn in place within 0 Prolene pursestring knot.
FINDINGS: The tip of the dialysis catheter is at the cavoatrial junction.
IMPRESSION: Successful left jugular tunneled dialysis catheter exchange.

## 2018-10-25 DIAGNOSIS — D631 Anemia in chronic kidney disease: Secondary | ICD-10-CM | POA: Diagnosis not present

## 2018-10-25 DIAGNOSIS — D509 Iron deficiency anemia, unspecified: Secondary | ICD-10-CM | POA: Diagnosis not present

## 2018-10-25 DIAGNOSIS — N2581 Secondary hyperparathyroidism of renal origin: Secondary | ICD-10-CM | POA: Diagnosis not present

## 2018-10-25 DIAGNOSIS — Z992 Dependence on renal dialysis: Secondary | ICD-10-CM | POA: Diagnosis not present

## 2018-10-25 DIAGNOSIS — N186 End stage renal disease: Secondary | ICD-10-CM | POA: Diagnosis not present

## 2018-10-27 DIAGNOSIS — D509 Iron deficiency anemia, unspecified: Secondary | ICD-10-CM | POA: Diagnosis not present

## 2018-10-27 DIAGNOSIS — N186 End stage renal disease: Secondary | ICD-10-CM | POA: Diagnosis not present

## 2018-10-27 DIAGNOSIS — D631 Anemia in chronic kidney disease: Secondary | ICD-10-CM | POA: Diagnosis not present

## 2018-10-27 DIAGNOSIS — Z992 Dependence on renal dialysis: Secondary | ICD-10-CM | POA: Diagnosis not present

## 2018-10-27 DIAGNOSIS — N2581 Secondary hyperparathyroidism of renal origin: Secondary | ICD-10-CM | POA: Diagnosis not present

## 2018-10-28 ENCOUNTER — Encounter: Payer: Self-pay | Admitting: Cardiology

## 2018-10-28 ENCOUNTER — Telehealth (INDEPENDENT_AMBULATORY_CARE_PROVIDER_SITE_OTHER): Payer: Medicare Other | Admitting: Cardiology

## 2018-10-28 DIAGNOSIS — I4821 Permanent atrial fibrillation: Secondary | ICD-10-CM

## 2018-10-28 DIAGNOSIS — Z950 Presence of cardiac pacemaker: Secondary | ICD-10-CM | POA: Diagnosis not present

## 2018-10-28 DIAGNOSIS — I495 Sick sinus syndrome: Secondary | ICD-10-CM

## 2018-10-28 DIAGNOSIS — N186 End stage renal disease: Secondary | ICD-10-CM | POA: Diagnosis not present

## 2018-10-28 DIAGNOSIS — I953 Hypotension of hemodialysis: Secondary | ICD-10-CM | POA: Diagnosis not present

## 2018-10-28 MED ORDER — CARVEDILOL 3.125 MG PO TABS: ORAL_TABLET | ORAL | 3 refills | Status: DC

## 2018-10-28 NOTE — Patient Instructions (Addendum)

## 2018-10-28 NOTE — Progress Notes (Signed)
Virtual Visit via Telephone Note   This visit type was conducted due to national recommendations for restrictions regarding the COVID-19 Pandemic (e.g. social distancing) in an effort to limit this patient's exposure and mitigate transmission in our community.  Due to his co-morbid illnesses, this patient is at least at moderate risk for complications without adequate follow up.  This format is felt to be most appropriate for this patient at this time.  The patient did not have access to video technology/had technical difficulties with video requiring transitioning to audio format only (telephone).  All issues noted in this document were discussed and addressed.  No physical exam could be performed with this format.  Please refer to the patient's chart for his  consent to telehealth for [facility]   Date:  10/28/2018   ID:  Dale Torres, DOB 13-Nov-1925, MRN [id number]  Patient Location: Home Provider Location: Office  PCP:  Monico Blitz, MD  Cardiologist:  Rozann Lesches, MD Electrophysiologist:  None   Evaluation Performed:  Follow-Up Visit  Chief Complaint:  Cardiac follow-up  History of Present Illness:    Dale Torres is a 85 y.o. male last seen in February.  We spoke by phone today, I also talked with his daughter.  He does not report any major change in terms of symptoms.  No palpitations or chest pain.  He continues to have issues with relatively low blood pressures, this has impacted his hemodialysis sessions to some degree.  He is on Proamatine.  He follows with Dr. Rayann Heman in the device clinic, Medtronic pacemaker in place.  Most recent device check showed persistent atrial fibrillation.  He has declined anticoagulation.  Dr. Rayann Heman mentioned considering stopping amiodarone.  The patient does not have symptoms concerning for COVID-19 infection (fever, chills, cough, or new shortness of breath).    Past Medical History:  Diagnosis Date  . Anemia    Dr. Sonny Dandy  .  Arthritis   . Benign prostatic hypertrophy   . Carpal tunnel syndrome of left wrist   . Coronary atherosclerosis of native coronary artery    Nonobstructive 2009  . ESRD on hemodialysis ([place])   . Essential hypertension, benign   . GERD (gastroesophageal reflux disease)   . Headache   . History of pneumonia   . HOH (hard of hearing)    Bilateral  hearing aids  . Hypothyroidism   . Persistent atrial fibrillation ([facility])    Declines coumadin  . Presence of permanent cardiac pacemaker   . Sick sinus syndrome [facility])    Status post pacemaker placement 2008  . Skin cancer, basal cell    Past Surgical History:  Procedure Laterality Date  . A/V FISTULAGRAM N/A 01/06/2017   Procedure: A/V FISTULAGRAM;  Surgeon: Waynetta Sandy, MD;  Location: [place] [facility];  Service: Cardiovascular;  Laterality: N/A;  . APPENDECTOMY    . AV FISTULA PLACEMENT Left 08/16/2014   Procedure: Left Arm creation of Brachiocephalic ARTERIOVENOUS (AV) FISTULA ;  Surgeon: Angelia Mould, MD;  Location: [place];  Service: Vascular;  Laterality: Left;  . AV FISTULA PLACEMENT Right 06/24/2016   Procedure: CREATION OF RIGHT RADIOCEPHALIC ARTERIOVENOUS (AV);  Surgeon: Rosetta Posner, MD;  Location: [place];  Service: Vascular;  Laterality: Right;  . CHOLECYSTECTOMY    . COLONOSCOPY W/ BIOPSIES AND POLYPECTOMY    . EYE SURGERY    . INSERTION OF DIALYSIS CATHETER Left 06/24/2016   Procedure: INSERTION OF DIALYSIS CATHETER - left Internal Jugular placement;  Surgeon: Rosetta Posner, MD;  Location: [place];  Service: Vascular;  Laterality: Left;  . IR FLUORO GUIDE CV LINE RIGHT  08/06/2016  . LAMINECTOMY    . LIGATION OF ARTERIOVENOUS  FISTULA Left 06/24/2016   Procedure: LIGATION OF left arm  ARTERIOVENOUS  FISTULA;  Surgeon: Rosetta Posner, MD;  Location: [place];  Service: Vascular;  Laterality: Left;  . PACEMAKER GENERATOR CHANGE  03/03/11   MDT Adaptal L implanted by Dr Rayann Heman  . PACEMAKER PLACEMENT  2008   Medtronic  - Dr. Doreatha Lew  . PERIPHERAL VASCULAR CATHETERIZATION Left 03/06/2015   Procedure: Fistulagram;  Surgeon: Serafina Mitchell, MD;  Location: [place] [facility];  Service: Cardiovascular;  Laterality: Left;  . PERMANENT PACEMAKER GENERATOR CHANGE N/A 03/03/2011   Procedure: PERMANENT PACEMAKER GENERATOR CHANGE;  Surgeon: Thompson Grayer, MD;  Location: [facility] CATH LAB;  Service: Cardiovascular;  Laterality: N/A;  . Right rotator cuff repair    . THROMBECTOMY AND REVISION OF ARTERIOVENTOUS (AV) GORETEX  GRAFT Left 03/11/2015   Procedure: THROMBECTOMY AND REVISION OF ARTERIOVENTOUS (AV) GORETEX  GRAFT LEFT ARM;  Surgeon: Rosetta Posner, MD;  Location: [place];  Service: Vascular;  Laterality: Left;  . TONSILLECTOMY       Current Meds  Medication Sig  . amiodarone (PACERONE) 200 MG tablet Take 200 mg by mouth daily.  Marland Kitchen aspirin EC 81 MG tablet Take 81 mg by mouth at bedtime.  . docusate sodium (COLACE) 100 MG capsule Take 100 mg by mouth daily.   . ferric citrate (AURYXIA) 1 GM 210 MG(Fe) tablet Take 1 tablet by mouth 3 (three) times daily with meals.  . folic acid (FOLVITE) 1 MG tablet Take 1 mg by mouth daily.    Marland Kitchen gabapentin (NEURONTIN) 400 MG capsule Take 400 mg by mouth at bedtime.   Marland Kitchen HYDROcodone-acetaminophen (NORCO) 10-325 MG tablet Take 1 tablet by mouth every 6 (six) hours as needed for severe pain. (Patient taking differently: Take 1 tablet by mouth 3 (three) times daily. )  . levothyroxine (SYNTHROID, LEVOTHROID) 125 MCG tablet Take 125 mcg by mouth daily at 2 PM. In the afternoon.  . Melatonin 5 MG TABS Take 5 mg by mouth at bedtime.  . midodrine (PROAMATINE) 10 MG tablet Take 10 mg by mouth 2 (two) times daily.   . naproxen sodium (ALEVE) 220 MG tablet Take 440 mg by mouth daily as needed (for pain.).  Marland Kitchen nitroGLYCERIN (NITROSTAT) 0.4 MG SL tablet Place 1 tablet (0.4 mg total) under the tongue every 5 (five) minutes as needed for chest pain (up to 3 doses. If no relief after 3rd dose, proceed to the ED  for an evaluation or call 911).  Marland Kitchen omeprazole (PRILOSEC) 20 MG capsule Take 20 mg by mouth at bedtime.   . tamsulosin (FLOMAX) 0.4 MG CAPS capsule Take 0.4 mg by mouth at bedtime.  . [DISCONTINUED] carvedilol (COREG) 3.125 MG tablet TAKE ONE TABLET BY MOUTH TWICE DAILY. EXCEPT, DO NOT TAKE THE MORNING DOSE ON YOUR DIALYSIS DAYS.     Allergies:   Oxycodone hcl   Social History   Tobacco Use  . Smoking status: Former Smoker    Packs/day: 0.80    Years: 12.00    Pack years: 9.60    Types: Cigarettes    Start date: 01/20/1948    Quit date: 01/20/1960    Years since quitting: 58.8  . Smokeless tobacco: Former Systems developer    Types: Chew    Quit date: 01/20/1980  .  Tobacco comment: chewed tobacco x's 10 years while playing golf only,used a pack per week  Substance Use Topics  . Alcohol use: Yes    Alcohol/week: 0.0 standard drinks    Comment: Occasional glass of wine  . Drug use: No     Family Hx: The patient's family history includes Coronary artery disease in his father, mother, and sister; Heart disease in his father, mother, and sister.  ROS:   Please see the history of present illness.    Hearing and memory loss. All other systems reviewed and are negative.   Prior CV studies:   The following studies were reviewed today:  Echocardiogram 06/06/2017 [facility]): LVEF 25 to 30%, MAC with moderate mitral regurgitation, moderate left atrial enlargement, right ventricular dilatation with decreased contraction, mild to moderate tricuspid regurgitation, mild right atrial enlargement.  Labs/Other Tests and Data Reviewed:    EKG:  An ECG dated 03/09/2018 was personally reviewed today and demonstrated:  Atrial fibrillation with left bundle branch block.  Recent Labs:  01/06/2017: BUN 31; Creatinine, Ser 4.30; Hemoglobin 9.9; Potassium 4.6; Sodium 135  Wt Readings from Last 3 Encounters:  10/28/18 163 lb 14.4 oz (74.3 kg)  03/09/18 165 lb (74.8 kg)  11/03/17 163 lb 6.4 oz  (74.1 kg)     Objective:    Vital Signs:  BP (!) 100/59   Pulse (!) 59   Ht _0  (1.753 m)   Wt 163 lb 14.4 oz (74.3 kg)   BMI 24.20 kg/m    Patient spoke in full sentences, not short of breath. No audible wheezing or coughing. Speech pattern normal.  ASSESSMENT & PLAN:    1.  Persistent atrial fibrillation.  Plan will be to manage as permanent atrial fibrillation with heart rate control.  He has declined anticoagulation consistently.  In light of his relatively low blood pressure, particularly affecting hemodialysis sessions, I do not think that we can stop his amiodarone as this is at least providing some heart rate control without much effect on blood pressure in oral form.  He has been able to take low-dose Coreg but does hold his morning dose for hemodialysis.  I suspect that if we went to Coreg alone we will run into difficulty with adequate heart rate control.  2.  Medtronic pacemaker in place.  Keep follow-up with Dr. Rayann Heman.  3.  End-stage renal disease on hemodialysis.  4.  Secondary cardiomyopathy with LVEF 25 to 30%.  This is being managed conservatively.  He is on limited medical regimen due to low blood pressures.  Fluid status managed by hemodialysis.  COVID-19 Education: The signs and symptoms of COVID-19 were discussed with the patient and how to seek care for testing (follow up with PCP or arrange E-visit).  The importance of social distancing was discussed today.  Time:   Today, I have spent 8 minutes with the patient with telehealth technology discussing the above problems.     Medication Adjustments/Labs and Tests Ordered: Current medicines are reviewed at length with the patient today.  Concerns regarding medicines are outlined above.   Tests Ordered: No orders of the defined types were placed in this encounter.   Medication Changes: Meds ordered this encounter  Medications  . carvedilol (COREG) 3.125 MG tablet    Sig: TAKE ONE TABLET BY MOUTH TWICE  DAILY. EXCEPT, DO NOT TAKE THE MORNING DOSE ON YOUR DIALYSIS DAYS.    Dispense:  180 tablet    Refill:  3  Follow Up:  Either In Person or Virtual Visit 6 months in the [place] office.  Signed, Rozann Lesches, MD  10/28/2018 9:22 AM    [facility]

## 2018-10-29 DIAGNOSIS — Z992 Dependence on renal dialysis: Secondary | ICD-10-CM | POA: Diagnosis not present

## 2018-10-29 DIAGNOSIS — D509 Iron deficiency anemia, unspecified: Secondary | ICD-10-CM | POA: Diagnosis not present

## 2018-10-29 DIAGNOSIS — N186 End stage renal disease: Secondary | ICD-10-CM | POA: Diagnosis not present

## 2018-10-29 DIAGNOSIS — D631 Anemia in chronic kidney disease: Secondary | ICD-10-CM | POA: Diagnosis not present

## 2018-10-29 DIAGNOSIS — N2581 Secondary hyperparathyroidism of renal origin: Secondary | ICD-10-CM | POA: Diagnosis not present

## 2018-11-01 DIAGNOSIS — D509 Iron deficiency anemia, unspecified: Secondary | ICD-10-CM | POA: Diagnosis not present

## 2018-11-01 DIAGNOSIS — D631 Anemia in chronic kidney disease: Secondary | ICD-10-CM | POA: Diagnosis not present

## 2018-11-01 DIAGNOSIS — N2581 Secondary hyperparathyroidism of renal origin: Secondary | ICD-10-CM | POA: Diagnosis not present

## 2018-11-01 DIAGNOSIS — N186 End stage renal disease: Secondary | ICD-10-CM | POA: Diagnosis not present

## 2018-11-01 DIAGNOSIS — Z992 Dependence on renal dialysis: Secondary | ICD-10-CM | POA: Diagnosis not present

## 2018-11-03 DIAGNOSIS — D509 Iron deficiency anemia, unspecified: Secondary | ICD-10-CM | POA: Diagnosis not present

## 2018-11-03 DIAGNOSIS — Z992 Dependence on renal dialysis: Secondary | ICD-10-CM | POA: Diagnosis not present

## 2018-11-03 DIAGNOSIS — N2581 Secondary hyperparathyroidism of renal origin: Secondary | ICD-10-CM | POA: Diagnosis not present

## 2018-11-03 DIAGNOSIS — D631 Anemia in chronic kidney disease: Secondary | ICD-10-CM | POA: Diagnosis not present

## 2018-11-03 DIAGNOSIS — N186 End stage renal disease: Secondary | ICD-10-CM | POA: Diagnosis not present

## 2018-11-05 DIAGNOSIS — Z992 Dependence on renal dialysis: Secondary | ICD-10-CM | POA: Diagnosis not present

## 2018-11-05 DIAGNOSIS — N2581 Secondary hyperparathyroidism of renal origin: Secondary | ICD-10-CM | POA: Diagnosis not present

## 2018-11-05 DIAGNOSIS — D509 Iron deficiency anemia, unspecified: Secondary | ICD-10-CM | POA: Diagnosis not present

## 2018-11-05 DIAGNOSIS — N186 End stage renal disease: Secondary | ICD-10-CM | POA: Diagnosis not present

## 2018-11-05 DIAGNOSIS — D631 Anemia in chronic kidney disease: Secondary | ICD-10-CM | POA: Diagnosis not present

## 2018-11-08 DIAGNOSIS — N186 End stage renal disease: Secondary | ICD-10-CM | POA: Diagnosis not present

## 2018-11-08 DIAGNOSIS — N2581 Secondary hyperparathyroidism of renal origin: Secondary | ICD-10-CM | POA: Diagnosis not present

## 2018-11-08 DIAGNOSIS — D509 Iron deficiency anemia, unspecified: Secondary | ICD-10-CM | POA: Diagnosis not present

## 2018-11-08 DIAGNOSIS — D631 Anemia in chronic kidney disease: Secondary | ICD-10-CM | POA: Diagnosis not present

## 2018-11-08 DIAGNOSIS — Z992 Dependence on renal dialysis: Secondary | ICD-10-CM | POA: Diagnosis not present

## 2018-11-10 DIAGNOSIS — D509 Iron deficiency anemia, unspecified: Secondary | ICD-10-CM | POA: Diagnosis not present

## 2018-11-10 DIAGNOSIS — N2581 Secondary hyperparathyroidism of renal origin: Secondary | ICD-10-CM | POA: Diagnosis not present

## 2018-11-10 DIAGNOSIS — D631 Anemia in chronic kidney disease: Secondary | ICD-10-CM | POA: Diagnosis not present

## 2018-11-10 DIAGNOSIS — Z992 Dependence on renal dialysis: Secondary | ICD-10-CM | POA: Diagnosis not present

## 2018-11-10 DIAGNOSIS — N186 End stage renal disease: Secondary | ICD-10-CM | POA: Diagnosis not present

## 2018-11-12 DIAGNOSIS — D631 Anemia in chronic kidney disease: Secondary | ICD-10-CM | POA: Diagnosis not present

## 2018-11-12 DIAGNOSIS — N2581 Secondary hyperparathyroidism of renal origin: Secondary | ICD-10-CM | POA: Diagnosis not present

## 2018-11-12 DIAGNOSIS — D509 Iron deficiency anemia, unspecified: Secondary | ICD-10-CM | POA: Diagnosis not present

## 2018-11-12 DIAGNOSIS — Z992 Dependence on renal dialysis: Secondary | ICD-10-CM | POA: Diagnosis not present

## 2018-11-12 DIAGNOSIS — N186 End stage renal disease: Secondary | ICD-10-CM | POA: Diagnosis not present

## 2018-11-15 DIAGNOSIS — N2581 Secondary hyperparathyroidism of renal origin: Secondary | ICD-10-CM | POA: Diagnosis not present

## 2018-11-15 DIAGNOSIS — D509 Iron deficiency anemia, unspecified: Secondary | ICD-10-CM | POA: Diagnosis not present

## 2018-11-15 DIAGNOSIS — Z992 Dependence on renal dialysis: Secondary | ICD-10-CM | POA: Diagnosis not present

## 2018-11-15 DIAGNOSIS — N186 End stage renal disease: Secondary | ICD-10-CM | POA: Diagnosis not present

## 2018-11-15 DIAGNOSIS — D631 Anemia in chronic kidney disease: Secondary | ICD-10-CM | POA: Diagnosis not present

## 2018-11-17 DIAGNOSIS — D631 Anemia in chronic kidney disease: Secondary | ICD-10-CM | POA: Diagnosis not present

## 2018-11-17 DIAGNOSIS — N186 End stage renal disease: Secondary | ICD-10-CM | POA: Diagnosis not present

## 2018-11-17 DIAGNOSIS — Z992 Dependence on renal dialysis: Secondary | ICD-10-CM | POA: Diagnosis not present

## 2018-11-17 DIAGNOSIS — D509 Iron deficiency anemia, unspecified: Secondary | ICD-10-CM | POA: Diagnosis not present

## 2018-11-17 DIAGNOSIS — N2581 Secondary hyperparathyroidism of renal origin: Secondary | ICD-10-CM | POA: Diagnosis not present

## 2018-11-18 DIAGNOSIS — I4891 Unspecified atrial fibrillation: Secondary | ICD-10-CM | POA: Diagnosis not present

## 2018-11-18 DIAGNOSIS — Z6825 Body mass index (BMI) 25.0-25.9, adult: Secondary | ICD-10-CM | POA: Diagnosis not present

## 2018-11-18 DIAGNOSIS — M549 Dorsalgia, unspecified: Secondary | ICD-10-CM | POA: Diagnosis not present

## 2018-11-18 DIAGNOSIS — I10 Essential (primary) hypertension: Secondary | ICD-10-CM | POA: Diagnosis not present

## 2018-11-18 DIAGNOSIS — Z992 Dependence on renal dialysis: Secondary | ICD-10-CM | POA: Diagnosis not present

## 2018-11-18 DIAGNOSIS — Z299 Encounter for prophylactic measures, unspecified: Secondary | ICD-10-CM | POA: Diagnosis not present

## 2018-11-19 DIAGNOSIS — N186 End stage renal disease: Secondary | ICD-10-CM | POA: Diagnosis not present

## 2018-11-19 DIAGNOSIS — N2581 Secondary hyperparathyroidism of renal origin: Secondary | ICD-10-CM | POA: Diagnosis not present

## 2018-11-19 DIAGNOSIS — D509 Iron deficiency anemia, unspecified: Secondary | ICD-10-CM | POA: Diagnosis not present

## 2018-11-19 DIAGNOSIS — Z992 Dependence on renal dialysis: Secondary | ICD-10-CM | POA: Diagnosis not present

## 2018-11-19 DIAGNOSIS — D631 Anemia in chronic kidney disease: Secondary | ICD-10-CM | POA: Diagnosis not present

## 2018-11-22 DIAGNOSIS — D509 Iron deficiency anemia, unspecified: Secondary | ICD-10-CM | POA: Diagnosis not present

## 2018-11-22 DIAGNOSIS — D631 Anemia in chronic kidney disease: Secondary | ICD-10-CM | POA: Diagnosis not present

## 2018-11-22 DIAGNOSIS — N2581 Secondary hyperparathyroidism of renal origin: Secondary | ICD-10-CM | POA: Diagnosis not present

## 2018-11-22 DIAGNOSIS — N186 End stage renal disease: Secondary | ICD-10-CM | POA: Diagnosis not present

## 2018-11-22 DIAGNOSIS — Z992 Dependence on renal dialysis: Secondary | ICD-10-CM | POA: Diagnosis not present

## 2018-11-24 DIAGNOSIS — N186 End stage renal disease: Secondary | ICD-10-CM | POA: Diagnosis not present

## 2018-11-24 DIAGNOSIS — D509 Iron deficiency anemia, unspecified: Secondary | ICD-10-CM | POA: Diagnosis not present

## 2018-11-24 DIAGNOSIS — D631 Anemia in chronic kidney disease: Secondary | ICD-10-CM | POA: Diagnosis not present

## 2018-11-24 DIAGNOSIS — Z992 Dependence on renal dialysis: Secondary | ICD-10-CM | POA: Diagnosis not present

## 2018-11-24 DIAGNOSIS — N2581 Secondary hyperparathyroidism of renal origin: Secondary | ICD-10-CM | POA: Diagnosis not present

## 2018-11-26 DIAGNOSIS — D631 Anemia in chronic kidney disease: Secondary | ICD-10-CM | POA: Diagnosis not present

## 2018-11-26 DIAGNOSIS — D509 Iron deficiency anemia, unspecified: Secondary | ICD-10-CM | POA: Diagnosis not present

## 2018-11-26 DIAGNOSIS — N2581 Secondary hyperparathyroidism of renal origin: Secondary | ICD-10-CM | POA: Diagnosis not present

## 2018-11-26 DIAGNOSIS — N186 End stage renal disease: Secondary | ICD-10-CM | POA: Diagnosis not present

## 2018-11-26 DIAGNOSIS — Z992 Dependence on renal dialysis: Secondary | ICD-10-CM | POA: Diagnosis not present

## 2018-11-29 DIAGNOSIS — N2581 Secondary hyperparathyroidism of renal origin: Secondary | ICD-10-CM | POA: Diagnosis not present

## 2018-11-29 DIAGNOSIS — D631 Anemia in chronic kidney disease: Secondary | ICD-10-CM | POA: Diagnosis not present

## 2018-11-29 DIAGNOSIS — Z992 Dependence on renal dialysis: Secondary | ICD-10-CM | POA: Diagnosis not present

## 2018-11-29 DIAGNOSIS — D509 Iron deficiency anemia, unspecified: Secondary | ICD-10-CM | POA: Diagnosis not present

## 2018-11-29 DIAGNOSIS — N186 End stage renal disease: Secondary | ICD-10-CM | POA: Diagnosis not present

## 2018-12-01 DIAGNOSIS — D631 Anemia in chronic kidney disease: Secondary | ICD-10-CM | POA: Diagnosis not present

## 2018-12-01 DIAGNOSIS — Z992 Dependence on renal dialysis: Secondary | ICD-10-CM | POA: Diagnosis not present

## 2018-12-01 DIAGNOSIS — N186 End stage renal disease: Secondary | ICD-10-CM | POA: Diagnosis not present

## 2018-12-01 DIAGNOSIS — N2581 Secondary hyperparathyroidism of renal origin: Secondary | ICD-10-CM | POA: Diagnosis not present

## 2018-12-01 DIAGNOSIS — D509 Iron deficiency anemia, unspecified: Secondary | ICD-10-CM | POA: Diagnosis not present

## 2018-12-03 DIAGNOSIS — N2581 Secondary hyperparathyroidism of renal origin: Secondary | ICD-10-CM | POA: Diagnosis not present

## 2018-12-03 DIAGNOSIS — D509 Iron deficiency anemia, unspecified: Secondary | ICD-10-CM | POA: Diagnosis not present

## 2018-12-03 DIAGNOSIS — N186 End stage renal disease: Secondary | ICD-10-CM | POA: Diagnosis not present

## 2018-12-03 DIAGNOSIS — D631 Anemia in chronic kidney disease: Secondary | ICD-10-CM | POA: Diagnosis not present

## 2018-12-03 DIAGNOSIS — Z992 Dependence on renal dialysis: Secondary | ICD-10-CM | POA: Diagnosis not present

## 2018-12-06 DIAGNOSIS — Z992 Dependence on renal dialysis: Secondary | ICD-10-CM | POA: Diagnosis not present

## 2018-12-06 DIAGNOSIS — D631 Anemia in chronic kidney disease: Secondary | ICD-10-CM | POA: Diagnosis not present

## 2018-12-06 DIAGNOSIS — D509 Iron deficiency anemia, unspecified: Secondary | ICD-10-CM | POA: Diagnosis not present

## 2018-12-06 DIAGNOSIS — N2581 Secondary hyperparathyroidism of renal origin: Secondary | ICD-10-CM | POA: Diagnosis not present

## 2018-12-06 DIAGNOSIS — N186 End stage renal disease: Secondary | ICD-10-CM | POA: Diagnosis not present

## 2018-12-08 DIAGNOSIS — N2581 Secondary hyperparathyroidism of renal origin: Secondary | ICD-10-CM | POA: Diagnosis not present

## 2018-12-08 DIAGNOSIS — Z992 Dependence on renal dialysis: Secondary | ICD-10-CM | POA: Diagnosis not present

## 2018-12-08 DIAGNOSIS — D509 Iron deficiency anemia, unspecified: Secondary | ICD-10-CM | POA: Diagnosis not present

## 2018-12-08 DIAGNOSIS — N186 End stage renal disease: Secondary | ICD-10-CM | POA: Diagnosis not present

## 2018-12-08 DIAGNOSIS — D631 Anemia in chronic kidney disease: Secondary | ICD-10-CM | POA: Diagnosis not present

## 2018-12-10 DIAGNOSIS — D509 Iron deficiency anemia, unspecified: Secondary | ICD-10-CM | POA: Diagnosis not present

## 2018-12-10 DIAGNOSIS — N2581 Secondary hyperparathyroidism of renal origin: Secondary | ICD-10-CM | POA: Diagnosis not present

## 2018-12-10 DIAGNOSIS — Z992 Dependence on renal dialysis: Secondary | ICD-10-CM | POA: Diagnosis not present

## 2018-12-10 DIAGNOSIS — D631 Anemia in chronic kidney disease: Secondary | ICD-10-CM | POA: Diagnosis not present

## 2018-12-10 DIAGNOSIS — N186 End stage renal disease: Secondary | ICD-10-CM | POA: Diagnosis not present

## 2018-12-12 DIAGNOSIS — I10 Essential (primary) hypertension: Secondary | ICD-10-CM | POA: Diagnosis not present

## 2018-12-12 DIAGNOSIS — Z299 Encounter for prophylactic measures, unspecified: Secondary | ICD-10-CM | POA: Diagnosis not present

## 2018-12-12 DIAGNOSIS — Z6825 Body mass index (BMI) 25.0-25.9, adult: Secondary | ICD-10-CM | POA: Diagnosis not present

## 2018-12-12 DIAGNOSIS — H6123 Impacted cerumen, bilateral: Secondary | ICD-10-CM | POA: Diagnosis not present

## 2018-12-13 DIAGNOSIS — Z992 Dependence on renal dialysis: Secondary | ICD-10-CM | POA: Diagnosis not present

## 2018-12-13 DIAGNOSIS — N2581 Secondary hyperparathyroidism of renal origin: Secondary | ICD-10-CM | POA: Diagnosis not present

## 2018-12-13 DIAGNOSIS — D631 Anemia in chronic kidney disease: Secondary | ICD-10-CM | POA: Diagnosis not present

## 2018-12-13 DIAGNOSIS — D509 Iron deficiency anemia, unspecified: Secondary | ICD-10-CM | POA: Diagnosis not present

## 2018-12-13 DIAGNOSIS — N186 End stage renal disease: Secondary | ICD-10-CM | POA: Diagnosis not present

## 2018-12-15 DIAGNOSIS — D509 Iron deficiency anemia, unspecified: Secondary | ICD-10-CM | POA: Diagnosis not present

## 2018-12-15 DIAGNOSIS — D631 Anemia in chronic kidney disease: Secondary | ICD-10-CM | POA: Diagnosis not present

## 2018-12-15 DIAGNOSIS — N186 End stage renal disease: Secondary | ICD-10-CM | POA: Diagnosis not present

## 2018-12-15 DIAGNOSIS — Z992 Dependence on renal dialysis: Secondary | ICD-10-CM | POA: Diagnosis not present

## 2018-12-15 DIAGNOSIS — N2581 Secondary hyperparathyroidism of renal origin: Secondary | ICD-10-CM | POA: Diagnosis not present

## 2018-12-17 DIAGNOSIS — D631 Anemia in chronic kidney disease: Secondary | ICD-10-CM | POA: Diagnosis not present

## 2018-12-17 DIAGNOSIS — D509 Iron deficiency anemia, unspecified: Secondary | ICD-10-CM | POA: Diagnosis not present

## 2018-12-17 DIAGNOSIS — N2581 Secondary hyperparathyroidism of renal origin: Secondary | ICD-10-CM | POA: Diagnosis not present

## 2018-12-17 DIAGNOSIS — Z992 Dependence on renal dialysis: Secondary | ICD-10-CM | POA: Diagnosis not present

## 2018-12-17 DIAGNOSIS — N186 End stage renal disease: Secondary | ICD-10-CM | POA: Diagnosis not present

## 2018-12-19 DIAGNOSIS — N186 End stage renal disease: Secondary | ICD-10-CM | POA: Diagnosis not present

## 2018-12-19 DIAGNOSIS — Z992 Dependence on renal dialysis: Secondary | ICD-10-CM | POA: Diagnosis not present

## 2018-12-21 DIAGNOSIS — I4891 Unspecified atrial fibrillation: Secondary | ICD-10-CM | POA: Diagnosis not present

## 2018-12-21 DIAGNOSIS — Z992 Dependence on renal dialysis: Secondary | ICD-10-CM | POA: Diagnosis not present

## 2018-12-21 DIAGNOSIS — Z299 Encounter for prophylactic measures, unspecified: Secondary | ICD-10-CM | POA: Diagnosis not present

## 2018-12-21 DIAGNOSIS — M549 Dorsalgia, unspecified: Secondary | ICD-10-CM | POA: Diagnosis not present

## 2018-12-21 DIAGNOSIS — Z6825 Body mass index (BMI) 25.0-25.9, adult: Secondary | ICD-10-CM | POA: Diagnosis not present

## 2018-12-21 DIAGNOSIS — I770 Arteriovenous fistula, acquired: Secondary | ICD-10-CM | POA: Diagnosis not present

## 2018-12-30 DIAGNOSIS — E8779 Other fluid overload: Secondary | ICD-10-CM | POA: Diagnosis not present

## 2018-12-30 DIAGNOSIS — E8770 Fluid overload, unspecified: Secondary | ICD-10-CM | POA: Diagnosis not present

## 2018-12-30 DIAGNOSIS — Z992 Dependence on renal dialysis: Secondary | ICD-10-CM | POA: Diagnosis not present

## 2018-12-30 DIAGNOSIS — N186 End stage renal disease: Secondary | ICD-10-CM | POA: Diagnosis not present

## 2019-01-16 ENCOUNTER — Ambulatory Visit (INDEPENDENT_AMBULATORY_CARE_PROVIDER_SITE_OTHER): Payer: Medicare Other | Admitting: *Deleted

## 2019-01-16 DIAGNOSIS — I495 Sick sinus syndrome: Secondary | ICD-10-CM

## 2019-01-18 LAB — CUP PACEART REMOTE DEVICE CHECK
Battery Impedance: 583 Ohm
Battery Remaining Longevity: 81 mo
Battery Voltage: 2.78 V
Brady Statistic AP VP Percent: 1 %
Brady Statistic AP VS Percent: 0 %
Brady Statistic AS VP Percent: 0 %
Brady Statistic AS VS Percent: 98 %
Date Time Interrogation Session: 20201229220447
Implantable Lead Implant Date: 20080604
Implantable Lead Implant Date: 20080604
Implantable Lead Location: 753859
Implantable Lead Location: 753860
Implantable Lead Model: 4469
Implantable Lead Model: 4470
Implantable Lead Serial Number: 498205
Implantable Lead Serial Number: 601713
Implantable Pulse Generator Implant Date: 20130212
Lead Channel Impedance Value: 424 Ohm
Lead Channel Impedance Value: 478 Ohm
Lead Channel Pacing Threshold Amplitude: 0.625 V
Lead Channel Pacing Threshold Amplitude: 0.625 V
Lead Channel Pacing Threshold Pulse Width: 0.4 ms
Lead Channel Pacing Threshold Pulse Width: 0.4 ms
Lead Channel Setting Pacing Amplitude: 2 V
Lead Channel Setting Pacing Amplitude: 2.5 V
Lead Channel Setting Pacing Pulse Width: 0.4 ms
Lead Channel Setting Sensing Sensitivity: 4 mV

## 2019-01-21 DIAGNOSIS — D509 Iron deficiency anemia, unspecified: Secondary | ICD-10-CM | POA: Diagnosis not present

## 2019-01-21 DIAGNOSIS — N2581 Secondary hyperparathyroidism of renal origin: Secondary | ICD-10-CM | POA: Diagnosis not present

## 2019-01-21 DIAGNOSIS — N186 End stage renal disease: Secondary | ICD-10-CM | POA: Diagnosis not present

## 2019-01-21 DIAGNOSIS — Z992 Dependence on renal dialysis: Secondary | ICD-10-CM | POA: Diagnosis not present

## 2019-01-21 DIAGNOSIS — D631 Anemia in chronic kidney disease: Secondary | ICD-10-CM | POA: Diagnosis not present

## 2019-01-24 DIAGNOSIS — N186 End stage renal disease: Secondary | ICD-10-CM | POA: Diagnosis not present

## 2019-01-24 DIAGNOSIS — Z992 Dependence on renal dialysis: Secondary | ICD-10-CM | POA: Diagnosis not present

## 2019-01-24 DIAGNOSIS — D509 Iron deficiency anemia, unspecified: Secondary | ICD-10-CM | POA: Diagnosis not present

## 2019-01-24 DIAGNOSIS — N2581 Secondary hyperparathyroidism of renal origin: Secondary | ICD-10-CM | POA: Diagnosis not present

## 2019-01-24 DIAGNOSIS — D631 Anemia in chronic kidney disease: Secondary | ICD-10-CM | POA: Diagnosis not present

## 2019-01-26 DIAGNOSIS — Z992 Dependence on renal dialysis: Secondary | ICD-10-CM | POA: Diagnosis not present

## 2019-01-26 DIAGNOSIS — D631 Anemia in chronic kidney disease: Secondary | ICD-10-CM | POA: Diagnosis not present

## 2019-01-26 DIAGNOSIS — N2581 Secondary hyperparathyroidism of renal origin: Secondary | ICD-10-CM | POA: Diagnosis not present

## 2019-01-26 DIAGNOSIS — D509 Iron deficiency anemia, unspecified: Secondary | ICD-10-CM | POA: Diagnosis not present

## 2019-01-26 DIAGNOSIS — N186 End stage renal disease: Secondary | ICD-10-CM | POA: Diagnosis not present

## 2019-01-28 DIAGNOSIS — N186 End stage renal disease: Secondary | ICD-10-CM | POA: Diagnosis not present

## 2019-01-28 DIAGNOSIS — Z992 Dependence on renal dialysis: Secondary | ICD-10-CM | POA: Diagnosis not present

## 2019-01-28 DIAGNOSIS — N2581 Secondary hyperparathyroidism of renal origin: Secondary | ICD-10-CM | POA: Diagnosis not present

## 2019-01-28 DIAGNOSIS — D509 Iron deficiency anemia, unspecified: Secondary | ICD-10-CM | POA: Diagnosis not present

## 2019-01-28 DIAGNOSIS — D631 Anemia in chronic kidney disease: Secondary | ICD-10-CM | POA: Diagnosis not present

## 2019-01-30 DIAGNOSIS — U071 COVID-19: Secondary | ICD-10-CM | POA: Diagnosis not present

## 2019-01-30 DIAGNOSIS — Z299 Encounter for prophylactic measures, unspecified: Secondary | ICD-10-CM | POA: Diagnosis not present

## 2019-01-30 DIAGNOSIS — Z6825 Body mass index (BMI) 25.0-25.9, adult: Secondary | ICD-10-CM | POA: Diagnosis not present

## 2019-01-30 DIAGNOSIS — J209 Acute bronchitis, unspecified: Secondary | ICD-10-CM | POA: Diagnosis not present

## 2019-01-30 DIAGNOSIS — I4891 Unspecified atrial fibrillation: Secondary | ICD-10-CM | POA: Diagnosis not present

## 2019-01-30 DIAGNOSIS — I10 Essential (primary) hypertension: Secondary | ICD-10-CM | POA: Diagnosis not present

## 2019-01-30 DIAGNOSIS — R531 Weakness: Secondary | ICD-10-CM | POA: Diagnosis not present

## 2019-01-30 DIAGNOSIS — Z789 Other specified health status: Secondary | ICD-10-CM | POA: Diagnosis not present

## 2019-01-31 DIAGNOSIS — D509 Iron deficiency anemia, unspecified: Secondary | ICD-10-CM | POA: Diagnosis not present

## 2019-01-31 DIAGNOSIS — N2581 Secondary hyperparathyroidism of renal origin: Secondary | ICD-10-CM | POA: Diagnosis not present

## 2019-01-31 DIAGNOSIS — Z992 Dependence on renal dialysis: Secondary | ICD-10-CM | POA: Diagnosis not present

## 2019-01-31 DIAGNOSIS — N186 End stage renal disease: Secondary | ICD-10-CM | POA: Diagnosis not present

## 2019-01-31 DIAGNOSIS — D631 Anemia in chronic kidney disease: Secondary | ICD-10-CM | POA: Diagnosis not present

## 2019-02-02 DIAGNOSIS — N2581 Secondary hyperparathyroidism of renal origin: Secondary | ICD-10-CM | POA: Diagnosis not present

## 2019-02-02 DIAGNOSIS — N186 End stage renal disease: Secondary | ICD-10-CM | POA: Diagnosis not present

## 2019-02-02 DIAGNOSIS — D509 Iron deficiency anemia, unspecified: Secondary | ICD-10-CM | POA: Diagnosis not present

## 2019-02-02 DIAGNOSIS — Z992 Dependence on renal dialysis: Secondary | ICD-10-CM | POA: Diagnosis not present

## 2019-02-02 DIAGNOSIS — D631 Anemia in chronic kidney disease: Secondary | ICD-10-CM | POA: Diagnosis not present

## 2019-02-04 DIAGNOSIS — D631 Anemia in chronic kidney disease: Secondary | ICD-10-CM | POA: Diagnosis not present

## 2019-02-04 DIAGNOSIS — Z992 Dependence on renal dialysis: Secondary | ICD-10-CM | POA: Diagnosis not present

## 2019-02-04 DIAGNOSIS — N2581 Secondary hyperparathyroidism of renal origin: Secondary | ICD-10-CM | POA: Diagnosis not present

## 2019-02-04 DIAGNOSIS — N186 End stage renal disease: Secondary | ICD-10-CM | POA: Diagnosis not present

## 2019-02-04 DIAGNOSIS — D509 Iron deficiency anemia, unspecified: Secondary | ICD-10-CM | POA: Diagnosis not present

## 2019-02-07 DIAGNOSIS — D631 Anemia in chronic kidney disease: Secondary | ICD-10-CM | POA: Diagnosis not present

## 2019-02-07 DIAGNOSIS — D509 Iron deficiency anemia, unspecified: Secondary | ICD-10-CM | POA: Diagnosis not present

## 2019-02-07 DIAGNOSIS — N2581 Secondary hyperparathyroidism of renal origin: Secondary | ICD-10-CM | POA: Diagnosis not present

## 2019-02-07 DIAGNOSIS — N186 End stage renal disease: Secondary | ICD-10-CM | POA: Diagnosis not present

## 2019-02-07 DIAGNOSIS — Z992 Dependence on renal dialysis: Secondary | ICD-10-CM | POA: Diagnosis not present

## 2019-02-08 DIAGNOSIS — M549 Dorsalgia, unspecified: Secondary | ICD-10-CM | POA: Diagnosis not present

## 2019-02-08 DIAGNOSIS — Z299 Encounter for prophylactic measures, unspecified: Secondary | ICD-10-CM | POA: Diagnosis not present

## 2019-02-08 DIAGNOSIS — Z789 Other specified health status: Secondary | ICD-10-CM | POA: Diagnosis not present

## 2019-02-08 DIAGNOSIS — U071 COVID-19: Secondary | ICD-10-CM | POA: Diagnosis not present

## 2019-02-08 DIAGNOSIS — I10 Essential (primary) hypertension: Secondary | ICD-10-CM | POA: Diagnosis not present

## 2019-02-08 DIAGNOSIS — Z6825 Body mass index (BMI) 25.0-25.9, adult: Secondary | ICD-10-CM | POA: Diagnosis not present

## 2019-02-08 DIAGNOSIS — L89322 Pressure ulcer of left buttock, stage 2: Secondary | ICD-10-CM | POA: Diagnosis not present

## 2019-02-09 DIAGNOSIS — Z992 Dependence on renal dialysis: Secondary | ICD-10-CM | POA: Diagnosis not present

## 2019-02-09 DIAGNOSIS — D509 Iron deficiency anemia, unspecified: Secondary | ICD-10-CM | POA: Diagnosis not present

## 2019-02-09 DIAGNOSIS — N2581 Secondary hyperparathyroidism of renal origin: Secondary | ICD-10-CM | POA: Diagnosis not present

## 2019-02-09 DIAGNOSIS — D631 Anemia in chronic kidney disease: Secondary | ICD-10-CM | POA: Diagnosis not present

## 2019-02-09 DIAGNOSIS — N186 End stage renal disease: Secondary | ICD-10-CM | POA: Diagnosis not present

## 2019-02-11 DIAGNOSIS — N186 End stage renal disease: Secondary | ICD-10-CM | POA: Diagnosis not present

## 2019-02-11 DIAGNOSIS — N2581 Secondary hyperparathyroidism of renal origin: Secondary | ICD-10-CM | POA: Diagnosis not present

## 2019-02-11 DIAGNOSIS — Z992 Dependence on renal dialysis: Secondary | ICD-10-CM | POA: Diagnosis not present

## 2019-02-11 DIAGNOSIS — D509 Iron deficiency anemia, unspecified: Secondary | ICD-10-CM | POA: Diagnosis not present

## 2019-02-11 DIAGNOSIS — D631 Anemia in chronic kidney disease: Secondary | ICD-10-CM | POA: Diagnosis not present

## 2019-02-14 DIAGNOSIS — D509 Iron deficiency anemia, unspecified: Secondary | ICD-10-CM | POA: Diagnosis not present

## 2019-02-14 DIAGNOSIS — N186 End stage renal disease: Secondary | ICD-10-CM | POA: Diagnosis not present

## 2019-02-14 DIAGNOSIS — D631 Anemia in chronic kidney disease: Secondary | ICD-10-CM | POA: Diagnosis not present

## 2019-02-14 DIAGNOSIS — N2581 Secondary hyperparathyroidism of renal origin: Secondary | ICD-10-CM | POA: Diagnosis not present

## 2019-02-14 DIAGNOSIS — Z992 Dependence on renal dialysis: Secondary | ICD-10-CM | POA: Diagnosis not present

## 2019-02-16 DIAGNOSIS — Z992 Dependence on renal dialysis: Secondary | ICD-10-CM | POA: Diagnosis not present

## 2019-02-16 DIAGNOSIS — D509 Iron deficiency anemia, unspecified: Secondary | ICD-10-CM | POA: Diagnosis not present

## 2019-02-16 DIAGNOSIS — N186 End stage renal disease: Secondary | ICD-10-CM | POA: Diagnosis not present

## 2019-02-16 DIAGNOSIS — N2581 Secondary hyperparathyroidism of renal origin: Secondary | ICD-10-CM | POA: Diagnosis not present

## 2019-02-16 DIAGNOSIS — D631 Anemia in chronic kidney disease: Secondary | ICD-10-CM | POA: Diagnosis not present

## 2019-02-18 DIAGNOSIS — D631 Anemia in chronic kidney disease: Secondary | ICD-10-CM | POA: Diagnosis not present

## 2019-02-18 DIAGNOSIS — N186 End stage renal disease: Secondary | ICD-10-CM | POA: Diagnosis not present

## 2019-02-18 DIAGNOSIS — Z992 Dependence on renal dialysis: Secondary | ICD-10-CM | POA: Diagnosis not present

## 2019-02-18 DIAGNOSIS — D509 Iron deficiency anemia, unspecified: Secondary | ICD-10-CM | POA: Diagnosis not present

## 2019-02-18 DIAGNOSIS — N2581 Secondary hyperparathyroidism of renal origin: Secondary | ICD-10-CM | POA: Diagnosis not present

## 2019-02-19 DIAGNOSIS — N186 End stage renal disease: Secondary | ICD-10-CM | POA: Diagnosis not present

## 2019-02-19 DIAGNOSIS — Z992 Dependence on renal dialysis: Secondary | ICD-10-CM | POA: Diagnosis not present

## 2019-02-21 DIAGNOSIS — N2581 Secondary hyperparathyroidism of renal origin: Secondary | ICD-10-CM | POA: Diagnosis not present

## 2019-02-21 DIAGNOSIS — D509 Iron deficiency anemia, unspecified: Secondary | ICD-10-CM | POA: Diagnosis not present

## 2019-02-21 DIAGNOSIS — Z992 Dependence on renal dialysis: Secondary | ICD-10-CM | POA: Diagnosis not present

## 2019-02-21 DIAGNOSIS — D631 Anemia in chronic kidney disease: Secondary | ICD-10-CM | POA: Diagnosis not present

## 2019-02-21 DIAGNOSIS — N186 End stage renal disease: Secondary | ICD-10-CM | POA: Diagnosis not present

## 2019-02-23 DIAGNOSIS — N186 End stage renal disease: Secondary | ICD-10-CM | POA: Diagnosis not present

## 2019-02-23 DIAGNOSIS — D509 Iron deficiency anemia, unspecified: Secondary | ICD-10-CM | POA: Diagnosis not present

## 2019-02-23 DIAGNOSIS — N2581 Secondary hyperparathyroidism of renal origin: Secondary | ICD-10-CM | POA: Diagnosis not present

## 2019-02-23 DIAGNOSIS — D631 Anemia in chronic kidney disease: Secondary | ICD-10-CM | POA: Diagnosis not present

## 2019-02-23 DIAGNOSIS — Z992 Dependence on renal dialysis: Secondary | ICD-10-CM | POA: Diagnosis not present

## 2019-02-25 DIAGNOSIS — D509 Iron deficiency anemia, unspecified: Secondary | ICD-10-CM | POA: Diagnosis not present

## 2019-02-25 DIAGNOSIS — Z992 Dependence on renal dialysis: Secondary | ICD-10-CM | POA: Diagnosis not present

## 2019-02-25 DIAGNOSIS — D631 Anemia in chronic kidney disease: Secondary | ICD-10-CM | POA: Diagnosis not present

## 2019-02-25 DIAGNOSIS — N2581 Secondary hyperparathyroidism of renal origin: Secondary | ICD-10-CM | POA: Diagnosis not present

## 2019-02-25 DIAGNOSIS — N186 End stage renal disease: Secondary | ICD-10-CM | POA: Diagnosis not present

## 2019-02-28 DIAGNOSIS — N2581 Secondary hyperparathyroidism of renal origin: Secondary | ICD-10-CM | POA: Diagnosis not present

## 2019-02-28 DIAGNOSIS — Z992 Dependence on renal dialysis: Secondary | ICD-10-CM | POA: Diagnosis not present

## 2019-02-28 DIAGNOSIS — D509 Iron deficiency anemia, unspecified: Secondary | ICD-10-CM | POA: Diagnosis not present

## 2019-02-28 DIAGNOSIS — N186 End stage renal disease: Secondary | ICD-10-CM | POA: Diagnosis not present

## 2019-02-28 DIAGNOSIS — D631 Anemia in chronic kidney disease: Secondary | ICD-10-CM | POA: Diagnosis not present

## 2019-03-02 DIAGNOSIS — N186 End stage renal disease: Secondary | ICD-10-CM | POA: Diagnosis not present

## 2019-03-02 DIAGNOSIS — N2581 Secondary hyperparathyroidism of renal origin: Secondary | ICD-10-CM | POA: Diagnosis not present

## 2019-03-02 DIAGNOSIS — D631 Anemia in chronic kidney disease: Secondary | ICD-10-CM | POA: Diagnosis not present

## 2019-03-02 DIAGNOSIS — Z992 Dependence on renal dialysis: Secondary | ICD-10-CM | POA: Diagnosis not present

## 2019-03-02 DIAGNOSIS — D509 Iron deficiency anemia, unspecified: Secondary | ICD-10-CM | POA: Diagnosis not present

## 2019-03-04 DIAGNOSIS — D631 Anemia in chronic kidney disease: Secondary | ICD-10-CM | POA: Diagnosis not present

## 2019-03-04 DIAGNOSIS — Z992 Dependence on renal dialysis: Secondary | ICD-10-CM | POA: Diagnosis not present

## 2019-03-04 DIAGNOSIS — N186 End stage renal disease: Secondary | ICD-10-CM | POA: Diagnosis not present

## 2019-03-04 DIAGNOSIS — N2581 Secondary hyperparathyroidism of renal origin: Secondary | ICD-10-CM | POA: Diagnosis not present

## 2019-03-04 DIAGNOSIS — D509 Iron deficiency anemia, unspecified: Secondary | ICD-10-CM | POA: Diagnosis not present

## 2019-03-07 DIAGNOSIS — D509 Iron deficiency anemia, unspecified: Secondary | ICD-10-CM | POA: Diagnosis not present

## 2019-03-07 DIAGNOSIS — D631 Anemia in chronic kidney disease: Secondary | ICD-10-CM | POA: Diagnosis not present

## 2019-03-07 DIAGNOSIS — N186 End stage renal disease: Secondary | ICD-10-CM | POA: Diagnosis not present

## 2019-03-07 DIAGNOSIS — Z992 Dependence on renal dialysis: Secondary | ICD-10-CM | POA: Diagnosis not present

## 2019-03-07 DIAGNOSIS — N2581 Secondary hyperparathyroidism of renal origin: Secondary | ICD-10-CM | POA: Diagnosis not present

## 2019-03-08 DIAGNOSIS — D509 Iron deficiency anemia, unspecified: Secondary | ICD-10-CM | POA: Diagnosis not present

## 2019-03-08 DIAGNOSIS — Z992 Dependence on renal dialysis: Secondary | ICD-10-CM | POA: Diagnosis not present

## 2019-03-08 DIAGNOSIS — N2581 Secondary hyperparathyroidism of renal origin: Secondary | ICD-10-CM | POA: Diagnosis not present

## 2019-03-08 DIAGNOSIS — D631 Anemia in chronic kidney disease: Secondary | ICD-10-CM | POA: Diagnosis not present

## 2019-03-08 DIAGNOSIS — N186 End stage renal disease: Secondary | ICD-10-CM | POA: Diagnosis not present

## 2019-03-11 DIAGNOSIS — D509 Iron deficiency anemia, unspecified: Secondary | ICD-10-CM | POA: Diagnosis not present

## 2019-03-11 DIAGNOSIS — Z992 Dependence on renal dialysis: Secondary | ICD-10-CM | POA: Diagnosis not present

## 2019-03-11 DIAGNOSIS — N186 End stage renal disease: Secondary | ICD-10-CM | POA: Diagnosis not present

## 2019-03-11 DIAGNOSIS — D631 Anemia in chronic kidney disease: Secondary | ICD-10-CM | POA: Diagnosis not present

## 2019-03-11 DIAGNOSIS — N2581 Secondary hyperparathyroidism of renal origin: Secondary | ICD-10-CM | POA: Diagnosis not present

## 2019-03-14 DIAGNOSIS — D631 Anemia in chronic kidney disease: Secondary | ICD-10-CM | POA: Diagnosis not present

## 2019-03-14 DIAGNOSIS — N2581 Secondary hyperparathyroidism of renal origin: Secondary | ICD-10-CM | POA: Diagnosis not present

## 2019-03-14 DIAGNOSIS — Z992 Dependence on renal dialysis: Secondary | ICD-10-CM | POA: Diagnosis not present

## 2019-03-14 DIAGNOSIS — D509 Iron deficiency anemia, unspecified: Secondary | ICD-10-CM | POA: Diagnosis not present

## 2019-03-14 DIAGNOSIS — N186 End stage renal disease: Secondary | ICD-10-CM | POA: Diagnosis not present

## 2019-03-15 DIAGNOSIS — M549 Dorsalgia, unspecified: Secondary | ICD-10-CM | POA: Diagnosis not present

## 2019-03-15 DIAGNOSIS — K219 Gastro-esophageal reflux disease without esophagitis: Secondary | ICD-10-CM | POA: Diagnosis not present

## 2019-03-15 DIAGNOSIS — Z789 Other specified health status: Secondary | ICD-10-CM | POA: Diagnosis not present

## 2019-03-15 DIAGNOSIS — L89322 Pressure ulcer of left buttock, stage 2: Secondary | ICD-10-CM | POA: Diagnosis not present

## 2019-03-15 DIAGNOSIS — I10 Essential (primary) hypertension: Secondary | ICD-10-CM | POA: Diagnosis not present

## 2019-03-15 DIAGNOSIS — Z6825 Body mass index (BMI) 25.0-25.9, adult: Secondary | ICD-10-CM | POA: Diagnosis not present

## 2019-03-15 DIAGNOSIS — Z299 Encounter for prophylactic measures, unspecified: Secondary | ICD-10-CM | POA: Diagnosis not present

## 2019-03-16 DIAGNOSIS — N2581 Secondary hyperparathyroidism of renal origin: Secondary | ICD-10-CM | POA: Diagnosis not present

## 2019-03-16 DIAGNOSIS — D631 Anemia in chronic kidney disease: Secondary | ICD-10-CM | POA: Diagnosis not present

## 2019-03-16 DIAGNOSIS — N186 End stage renal disease: Secondary | ICD-10-CM | POA: Diagnosis not present

## 2019-03-16 DIAGNOSIS — D509 Iron deficiency anemia, unspecified: Secondary | ICD-10-CM | POA: Diagnosis not present

## 2019-03-16 DIAGNOSIS — Z992 Dependence on renal dialysis: Secondary | ICD-10-CM | POA: Diagnosis not present

## 2019-03-18 DIAGNOSIS — N2581 Secondary hyperparathyroidism of renal origin: Secondary | ICD-10-CM | POA: Diagnosis not present

## 2019-03-18 DIAGNOSIS — D509 Iron deficiency anemia, unspecified: Secondary | ICD-10-CM | POA: Diagnosis not present

## 2019-03-18 DIAGNOSIS — N186 End stage renal disease: Secondary | ICD-10-CM | POA: Diagnosis not present

## 2019-03-18 DIAGNOSIS — D631 Anemia in chronic kidney disease: Secondary | ICD-10-CM | POA: Diagnosis not present

## 2019-03-18 DIAGNOSIS — Z992 Dependence on renal dialysis: Secondary | ICD-10-CM | POA: Diagnosis not present

## 2019-03-19 DIAGNOSIS — N186 End stage renal disease: Secondary | ICD-10-CM | POA: Diagnosis not present

## 2019-03-19 DIAGNOSIS — Z992 Dependence on renal dialysis: Secondary | ICD-10-CM | POA: Diagnosis not present

## 2019-03-21 DIAGNOSIS — Z992 Dependence on renal dialysis: Secondary | ICD-10-CM | POA: Diagnosis not present

## 2019-03-21 DIAGNOSIS — N2581 Secondary hyperparathyroidism of renal origin: Secondary | ICD-10-CM | POA: Diagnosis not present

## 2019-03-21 DIAGNOSIS — N186 End stage renal disease: Secondary | ICD-10-CM | POA: Diagnosis not present

## 2019-03-21 DIAGNOSIS — D509 Iron deficiency anemia, unspecified: Secondary | ICD-10-CM | POA: Diagnosis not present

## 2019-03-21 DIAGNOSIS — D631 Anemia in chronic kidney disease: Secondary | ICD-10-CM | POA: Diagnosis not present

## 2019-03-23 DIAGNOSIS — Z992 Dependence on renal dialysis: Secondary | ICD-10-CM | POA: Diagnosis not present

## 2019-03-23 DIAGNOSIS — N2581 Secondary hyperparathyroidism of renal origin: Secondary | ICD-10-CM | POA: Diagnosis not present

## 2019-03-23 DIAGNOSIS — D509 Iron deficiency anemia, unspecified: Secondary | ICD-10-CM | POA: Diagnosis not present

## 2019-03-23 DIAGNOSIS — D631 Anemia in chronic kidney disease: Secondary | ICD-10-CM | POA: Diagnosis not present

## 2019-03-23 DIAGNOSIS — N186 End stage renal disease: Secondary | ICD-10-CM | POA: Diagnosis not present

## 2019-03-25 DIAGNOSIS — N186 End stage renal disease: Secondary | ICD-10-CM | POA: Diagnosis not present

## 2019-03-25 DIAGNOSIS — Z992 Dependence on renal dialysis: Secondary | ICD-10-CM | POA: Diagnosis not present

## 2019-03-25 DIAGNOSIS — D631 Anemia in chronic kidney disease: Secondary | ICD-10-CM | POA: Diagnosis not present

## 2019-03-25 DIAGNOSIS — N2581 Secondary hyperparathyroidism of renal origin: Secondary | ICD-10-CM | POA: Diagnosis not present

## 2019-03-25 DIAGNOSIS — D509 Iron deficiency anemia, unspecified: Secondary | ICD-10-CM | POA: Diagnosis not present

## 2019-03-28 DIAGNOSIS — Z992 Dependence on renal dialysis: Secondary | ICD-10-CM | POA: Diagnosis not present

## 2019-03-28 DIAGNOSIS — D631 Anemia in chronic kidney disease: Secondary | ICD-10-CM | POA: Diagnosis not present

## 2019-03-28 DIAGNOSIS — N186 End stage renal disease: Secondary | ICD-10-CM | POA: Diagnosis not present

## 2019-03-28 DIAGNOSIS — D509 Iron deficiency anemia, unspecified: Secondary | ICD-10-CM | POA: Diagnosis not present

## 2019-03-28 DIAGNOSIS — N2581 Secondary hyperparathyroidism of renal origin: Secondary | ICD-10-CM | POA: Diagnosis not present

## 2019-03-30 DIAGNOSIS — D631 Anemia in chronic kidney disease: Secondary | ICD-10-CM | POA: Diagnosis not present

## 2019-03-30 DIAGNOSIS — D509 Iron deficiency anemia, unspecified: Secondary | ICD-10-CM | POA: Diagnosis not present

## 2019-03-30 DIAGNOSIS — N2581 Secondary hyperparathyroidism of renal origin: Secondary | ICD-10-CM | POA: Diagnosis not present

## 2019-03-30 DIAGNOSIS — Z992 Dependence on renal dialysis: Secondary | ICD-10-CM | POA: Diagnosis not present

## 2019-03-30 DIAGNOSIS — N186 End stage renal disease: Secondary | ICD-10-CM | POA: Diagnosis not present

## 2019-04-01 DIAGNOSIS — D509 Iron deficiency anemia, unspecified: Secondary | ICD-10-CM | POA: Diagnosis not present

## 2019-04-01 DIAGNOSIS — N186 End stage renal disease: Secondary | ICD-10-CM | POA: Diagnosis not present

## 2019-04-01 DIAGNOSIS — Z992 Dependence on renal dialysis: Secondary | ICD-10-CM | POA: Diagnosis not present

## 2019-04-01 DIAGNOSIS — D631 Anemia in chronic kidney disease: Secondary | ICD-10-CM | POA: Diagnosis not present

## 2019-04-01 DIAGNOSIS — N2581 Secondary hyperparathyroidism of renal origin: Secondary | ICD-10-CM | POA: Diagnosis not present

## 2019-04-04 DIAGNOSIS — D631 Anemia in chronic kidney disease: Secondary | ICD-10-CM | POA: Diagnosis not present

## 2019-04-04 DIAGNOSIS — D509 Iron deficiency anemia, unspecified: Secondary | ICD-10-CM | POA: Diagnosis not present

## 2019-04-04 DIAGNOSIS — N2581 Secondary hyperparathyroidism of renal origin: Secondary | ICD-10-CM | POA: Diagnosis not present

## 2019-04-04 DIAGNOSIS — N186 End stage renal disease: Secondary | ICD-10-CM | POA: Diagnosis not present

## 2019-04-04 DIAGNOSIS — Z992 Dependence on renal dialysis: Secondary | ICD-10-CM | POA: Diagnosis not present

## 2019-04-06 DIAGNOSIS — N2581 Secondary hyperparathyroidism of renal origin: Secondary | ICD-10-CM | POA: Diagnosis not present

## 2019-04-06 DIAGNOSIS — D509 Iron deficiency anemia, unspecified: Secondary | ICD-10-CM | POA: Diagnosis not present

## 2019-04-06 DIAGNOSIS — D631 Anemia in chronic kidney disease: Secondary | ICD-10-CM | POA: Diagnosis not present

## 2019-04-06 DIAGNOSIS — Z992 Dependence on renal dialysis: Secondary | ICD-10-CM | POA: Diagnosis not present

## 2019-04-06 DIAGNOSIS — N186 End stage renal disease: Secondary | ICD-10-CM | POA: Diagnosis not present

## 2019-04-08 DIAGNOSIS — D509 Iron deficiency anemia, unspecified: Secondary | ICD-10-CM | POA: Diagnosis not present

## 2019-04-08 DIAGNOSIS — D631 Anemia in chronic kidney disease: Secondary | ICD-10-CM | POA: Diagnosis not present

## 2019-04-08 DIAGNOSIS — N2581 Secondary hyperparathyroidism of renal origin: Secondary | ICD-10-CM | POA: Diagnosis not present

## 2019-04-08 DIAGNOSIS — N186 End stage renal disease: Secondary | ICD-10-CM | POA: Diagnosis not present

## 2019-04-08 DIAGNOSIS — Z992 Dependence on renal dialysis: Secondary | ICD-10-CM | POA: Diagnosis not present

## 2019-04-11 DIAGNOSIS — D631 Anemia in chronic kidney disease: Secondary | ICD-10-CM | POA: Diagnosis not present

## 2019-04-11 DIAGNOSIS — D509 Iron deficiency anemia, unspecified: Secondary | ICD-10-CM | POA: Diagnosis not present

## 2019-04-11 DIAGNOSIS — Z992 Dependence on renal dialysis: Secondary | ICD-10-CM | POA: Diagnosis not present

## 2019-04-11 DIAGNOSIS — N186 End stage renal disease: Secondary | ICD-10-CM | POA: Diagnosis not present

## 2019-04-11 DIAGNOSIS — N2581 Secondary hyperparathyroidism of renal origin: Secondary | ICD-10-CM | POA: Diagnosis not present

## 2019-04-12 DIAGNOSIS — Z299 Encounter for prophylactic measures, unspecified: Secondary | ICD-10-CM | POA: Diagnosis not present

## 2019-04-12 DIAGNOSIS — I4891 Unspecified atrial fibrillation: Secondary | ICD-10-CM | POA: Diagnosis not present

## 2019-04-12 DIAGNOSIS — I770 Arteriovenous fistula, acquired: Secondary | ICD-10-CM | POA: Diagnosis not present

## 2019-04-12 DIAGNOSIS — N186 End stage renal disease: Secondary | ICD-10-CM | POA: Diagnosis not present

## 2019-04-12 DIAGNOSIS — M549 Dorsalgia, unspecified: Secondary | ICD-10-CM | POA: Diagnosis not present

## 2019-04-13 DIAGNOSIS — D509 Iron deficiency anemia, unspecified: Secondary | ICD-10-CM | POA: Diagnosis not present

## 2019-04-13 DIAGNOSIS — N2581 Secondary hyperparathyroidism of renal origin: Secondary | ICD-10-CM | POA: Diagnosis not present

## 2019-04-13 DIAGNOSIS — N186 End stage renal disease: Secondary | ICD-10-CM | POA: Diagnosis not present

## 2019-04-13 DIAGNOSIS — D631 Anemia in chronic kidney disease: Secondary | ICD-10-CM | POA: Diagnosis not present

## 2019-04-13 DIAGNOSIS — Z992 Dependence on renal dialysis: Secondary | ICD-10-CM | POA: Diagnosis not present

## 2019-04-15 DIAGNOSIS — Z992 Dependence on renal dialysis: Secondary | ICD-10-CM | POA: Diagnosis not present

## 2019-04-15 DIAGNOSIS — D509 Iron deficiency anemia, unspecified: Secondary | ICD-10-CM | POA: Diagnosis not present

## 2019-04-15 DIAGNOSIS — N186 End stage renal disease: Secondary | ICD-10-CM | POA: Diagnosis not present

## 2019-04-15 DIAGNOSIS — N2581 Secondary hyperparathyroidism of renal origin: Secondary | ICD-10-CM | POA: Diagnosis not present

## 2019-04-15 DIAGNOSIS — D631 Anemia in chronic kidney disease: Secondary | ICD-10-CM | POA: Diagnosis not present

## 2019-04-18 DIAGNOSIS — N186 End stage renal disease: Secondary | ICD-10-CM | POA: Diagnosis not present

## 2019-04-18 DIAGNOSIS — D631 Anemia in chronic kidney disease: Secondary | ICD-10-CM | POA: Diagnosis not present

## 2019-04-18 DIAGNOSIS — N2581 Secondary hyperparathyroidism of renal origin: Secondary | ICD-10-CM | POA: Diagnosis not present

## 2019-04-18 DIAGNOSIS — D509 Iron deficiency anemia, unspecified: Secondary | ICD-10-CM | POA: Diagnosis not present

## 2019-04-18 DIAGNOSIS — Z992 Dependence on renal dialysis: Secondary | ICD-10-CM | POA: Diagnosis not present

## 2019-04-19 DIAGNOSIS — Z992 Dependence on renal dialysis: Secondary | ICD-10-CM | POA: Diagnosis not present

## 2019-04-19 DIAGNOSIS — N186 End stage renal disease: Secondary | ICD-10-CM | POA: Diagnosis not present

## 2019-04-20 DIAGNOSIS — N186 End stage renal disease: Secondary | ICD-10-CM | POA: Diagnosis not present

## 2019-04-20 DIAGNOSIS — Z992 Dependence on renal dialysis: Secondary | ICD-10-CM | POA: Diagnosis not present

## 2019-04-20 DIAGNOSIS — N2581 Secondary hyperparathyroidism of renal origin: Secondary | ICD-10-CM | POA: Diagnosis not present

## 2019-04-20 DIAGNOSIS — D631 Anemia in chronic kidney disease: Secondary | ICD-10-CM | POA: Diagnosis not present

## 2019-04-20 DIAGNOSIS — D509 Iron deficiency anemia, unspecified: Secondary | ICD-10-CM | POA: Diagnosis not present

## 2019-04-21 ENCOUNTER — Encounter: Payer: Medicare Other | Admitting: Internal Medicine

## 2019-04-22 DIAGNOSIS — N186 End stage renal disease: Secondary | ICD-10-CM | POA: Diagnosis not present

## 2019-04-22 DIAGNOSIS — Z992 Dependence on renal dialysis: Secondary | ICD-10-CM | POA: Diagnosis not present

## 2019-04-22 DIAGNOSIS — N2581 Secondary hyperparathyroidism of renal origin: Secondary | ICD-10-CM | POA: Diagnosis not present

## 2019-04-22 DIAGNOSIS — D509 Iron deficiency anemia, unspecified: Secondary | ICD-10-CM | POA: Diagnosis not present

## 2019-04-22 DIAGNOSIS — D631 Anemia in chronic kidney disease: Secondary | ICD-10-CM | POA: Diagnosis not present

## 2019-04-25 DIAGNOSIS — Z992 Dependence on renal dialysis: Secondary | ICD-10-CM | POA: Diagnosis not present

## 2019-04-25 DIAGNOSIS — D631 Anemia in chronic kidney disease: Secondary | ICD-10-CM | POA: Diagnosis not present

## 2019-04-25 DIAGNOSIS — N186 End stage renal disease: Secondary | ICD-10-CM | POA: Diagnosis not present

## 2019-04-25 DIAGNOSIS — N2581 Secondary hyperparathyroidism of renal origin: Secondary | ICD-10-CM | POA: Diagnosis not present

## 2019-04-25 DIAGNOSIS — D509 Iron deficiency anemia, unspecified: Secondary | ICD-10-CM | POA: Diagnosis not present

## 2019-04-27 DIAGNOSIS — D631 Anemia in chronic kidney disease: Secondary | ICD-10-CM | POA: Diagnosis not present

## 2019-04-27 DIAGNOSIS — N2581 Secondary hyperparathyroidism of renal origin: Secondary | ICD-10-CM | POA: Diagnosis not present

## 2019-04-27 DIAGNOSIS — N186 End stage renal disease: Secondary | ICD-10-CM | POA: Diagnosis not present

## 2019-04-27 DIAGNOSIS — D509 Iron deficiency anemia, unspecified: Secondary | ICD-10-CM | POA: Diagnosis not present

## 2019-04-27 DIAGNOSIS — Z992 Dependence on renal dialysis: Secondary | ICD-10-CM | POA: Diagnosis not present

## 2019-04-29 DIAGNOSIS — D631 Anemia in chronic kidney disease: Secondary | ICD-10-CM | POA: Diagnosis not present

## 2019-04-29 DIAGNOSIS — N186 End stage renal disease: Secondary | ICD-10-CM | POA: Diagnosis not present

## 2019-04-29 DIAGNOSIS — N2581 Secondary hyperparathyroidism of renal origin: Secondary | ICD-10-CM | POA: Diagnosis not present

## 2019-04-29 DIAGNOSIS — Z992 Dependence on renal dialysis: Secondary | ICD-10-CM | POA: Diagnosis not present

## 2019-04-29 DIAGNOSIS — D509 Iron deficiency anemia, unspecified: Secondary | ICD-10-CM | POA: Diagnosis not present

## 2019-05-02 DIAGNOSIS — Z992 Dependence on renal dialysis: Secondary | ICD-10-CM | POA: Diagnosis not present

## 2019-05-02 DIAGNOSIS — N186 End stage renal disease: Secondary | ICD-10-CM | POA: Diagnosis not present

## 2019-05-02 DIAGNOSIS — D509 Iron deficiency anemia, unspecified: Secondary | ICD-10-CM | POA: Diagnosis not present

## 2019-05-02 DIAGNOSIS — D631 Anemia in chronic kidney disease: Secondary | ICD-10-CM | POA: Diagnosis not present

## 2019-05-02 DIAGNOSIS — N2581 Secondary hyperparathyroidism of renal origin: Secondary | ICD-10-CM | POA: Diagnosis not present

## 2019-05-04 DIAGNOSIS — D631 Anemia in chronic kidney disease: Secondary | ICD-10-CM | POA: Diagnosis not present

## 2019-05-04 DIAGNOSIS — N186 End stage renal disease: Secondary | ICD-10-CM | POA: Diagnosis not present

## 2019-05-04 DIAGNOSIS — N2581 Secondary hyperparathyroidism of renal origin: Secondary | ICD-10-CM | POA: Diagnosis not present

## 2019-05-04 DIAGNOSIS — Z992 Dependence on renal dialysis: Secondary | ICD-10-CM | POA: Diagnosis not present

## 2019-05-04 DIAGNOSIS — D509 Iron deficiency anemia, unspecified: Secondary | ICD-10-CM | POA: Diagnosis not present

## 2019-05-05 ENCOUNTER — Ambulatory Visit (INDEPENDENT_AMBULATORY_CARE_PROVIDER_SITE_OTHER): Payer: Medicare Other | Admitting: *Deleted

## 2019-05-05 DIAGNOSIS — I495 Sick sinus syndrome: Secondary | ICD-10-CM

## 2019-05-06 DIAGNOSIS — D631 Anemia in chronic kidney disease: Secondary | ICD-10-CM | POA: Diagnosis not present

## 2019-05-06 DIAGNOSIS — D509 Iron deficiency anemia, unspecified: Secondary | ICD-10-CM | POA: Diagnosis not present

## 2019-05-06 DIAGNOSIS — N2581 Secondary hyperparathyroidism of renal origin: Secondary | ICD-10-CM | POA: Diagnosis not present

## 2019-05-06 DIAGNOSIS — N186 End stage renal disease: Secondary | ICD-10-CM | POA: Diagnosis not present

## 2019-05-06 DIAGNOSIS — Z992 Dependence on renal dialysis: Secondary | ICD-10-CM | POA: Diagnosis not present

## 2019-05-07 LAB — CUP PACEART REMOTE DEVICE CHECK
Battery Impedance: 659 Ohm
Battery Remaining Longevity: 76 mo
Battery Voltage: 2.78 V
Brady Statistic AP VP Percent: 2 %
Brady Statistic AP VS Percent: 0 %
Brady Statistic AS VP Percent: 0 %
Brady Statistic AS VS Percent: 98 %
Date Time Interrogation Session: 20210417123339
Implantable Lead Implant Date: 20080604
Implantable Lead Implant Date: 20080604
Implantable Lead Location: 753859
Implantable Lead Location: 753860
Implantable Lead Model: 4469
Implantable Lead Model: 4470
Implantable Lead Serial Number: 498205
Implantable Lead Serial Number: 601713
Implantable Pulse Generator Implant Date: 20130212
Lead Channel Impedance Value: 436 Ohm
Lead Channel Impedance Value: 487 Ohm
Lead Channel Pacing Threshold Amplitude: 0.625 V
Lead Channel Pacing Threshold Amplitude: 0.625 V
Lead Channel Pacing Threshold Pulse Width: 0.4 ms
Lead Channel Pacing Threshold Pulse Width: 0.4 ms
Lead Channel Setting Pacing Amplitude: 2 V
Lead Channel Setting Pacing Amplitude: 2.5 V
Lead Channel Setting Pacing Pulse Width: 0.4 ms
Lead Channel Setting Sensing Sensitivity: 4 mV

## 2019-05-08 NOTE — Progress Notes (Signed)
PPM Remote  

## 2019-05-09 DIAGNOSIS — N2581 Secondary hyperparathyroidism of renal origin: Secondary | ICD-10-CM | POA: Diagnosis not present

## 2019-05-09 DIAGNOSIS — D509 Iron deficiency anemia, unspecified: Secondary | ICD-10-CM | POA: Diagnosis not present

## 2019-05-09 DIAGNOSIS — D631 Anemia in chronic kidney disease: Secondary | ICD-10-CM | POA: Diagnosis not present

## 2019-05-09 DIAGNOSIS — N186 End stage renal disease: Secondary | ICD-10-CM | POA: Diagnosis not present

## 2019-05-09 DIAGNOSIS — Z992 Dependence on renal dialysis: Secondary | ICD-10-CM | POA: Diagnosis not present

## 2019-05-11 DIAGNOSIS — N186 End stage renal disease: Secondary | ICD-10-CM | POA: Diagnosis not present

## 2019-05-11 DIAGNOSIS — Z992 Dependence on renal dialysis: Secondary | ICD-10-CM | POA: Diagnosis not present

## 2019-05-11 DIAGNOSIS — D631 Anemia in chronic kidney disease: Secondary | ICD-10-CM | POA: Diagnosis not present

## 2019-05-11 DIAGNOSIS — N2581 Secondary hyperparathyroidism of renal origin: Secondary | ICD-10-CM | POA: Diagnosis not present

## 2019-05-11 DIAGNOSIS — D509 Iron deficiency anemia, unspecified: Secondary | ICD-10-CM | POA: Diagnosis not present

## 2019-05-13 DIAGNOSIS — Z992 Dependence on renal dialysis: Secondary | ICD-10-CM | POA: Diagnosis not present

## 2019-05-13 DIAGNOSIS — D631 Anemia in chronic kidney disease: Secondary | ICD-10-CM | POA: Diagnosis not present

## 2019-05-13 DIAGNOSIS — N186 End stage renal disease: Secondary | ICD-10-CM | POA: Diagnosis not present

## 2019-05-13 DIAGNOSIS — N2581 Secondary hyperparathyroidism of renal origin: Secondary | ICD-10-CM | POA: Diagnosis not present

## 2019-05-13 DIAGNOSIS — D509 Iron deficiency anemia, unspecified: Secondary | ICD-10-CM | POA: Diagnosis not present

## 2019-05-16 DIAGNOSIS — N186 End stage renal disease: Secondary | ICD-10-CM | POA: Diagnosis not present

## 2019-05-16 DIAGNOSIS — N2581 Secondary hyperparathyroidism of renal origin: Secondary | ICD-10-CM | POA: Diagnosis not present

## 2019-05-16 DIAGNOSIS — D509 Iron deficiency anemia, unspecified: Secondary | ICD-10-CM | POA: Diagnosis not present

## 2019-05-16 DIAGNOSIS — D631 Anemia in chronic kidney disease: Secondary | ICD-10-CM | POA: Diagnosis not present

## 2019-05-16 DIAGNOSIS — Z992 Dependence on renal dialysis: Secondary | ICD-10-CM | POA: Diagnosis not present

## 2019-05-18 DIAGNOSIS — K5903 Drug induced constipation: Secondary | ICD-10-CM | POA: Diagnosis not present

## 2019-05-18 DIAGNOSIS — T402X5A Adverse effect of other opioids, initial encounter: Secondary | ICD-10-CM | POA: Diagnosis not present

## 2019-05-18 DIAGNOSIS — M549 Dorsalgia, unspecified: Secondary | ICD-10-CM | POA: Diagnosis not present

## 2019-05-18 DIAGNOSIS — I4891 Unspecified atrial fibrillation: Secondary | ICD-10-CM | POA: Diagnosis not present

## 2019-05-18 DIAGNOSIS — Z299 Encounter for prophylactic measures, unspecified: Secondary | ICD-10-CM | POA: Diagnosis not present

## 2019-05-19 DIAGNOSIS — K219 Gastro-esophageal reflux disease without esophagitis: Secondary | ICD-10-CM | POA: Diagnosis not present

## 2019-05-19 DIAGNOSIS — Z8249 Family history of ischemic heart disease and other diseases of the circulatory system: Secondary | ICD-10-CM | POA: Diagnosis not present

## 2019-05-19 DIAGNOSIS — Z7982 Long term (current) use of aspirin: Secondary | ICD-10-CM | POA: Diagnosis not present

## 2019-05-19 DIAGNOSIS — S3993XA Unspecified injury of pelvis, initial encounter: Secondary | ICD-10-CM | POA: Diagnosis not present

## 2019-05-19 DIAGNOSIS — Z950 Presence of cardiac pacemaker: Secondary | ICD-10-CM | POA: Diagnosis not present

## 2019-05-19 DIAGNOSIS — N186 End stage renal disease: Secondary | ICD-10-CM | POA: Diagnosis not present

## 2019-05-19 DIAGNOSIS — W01198A Fall on same level from slipping, tripping and stumbling with subsequent striking against other object, initial encounter: Secondary | ICD-10-CM | POA: Diagnosis not present

## 2019-05-19 DIAGNOSIS — Z992 Dependence on renal dialysis: Secondary | ICD-10-CM | POA: Diagnosis not present

## 2019-05-19 DIAGNOSIS — I10 Essential (primary) hypertension: Secondary | ICD-10-CM | POA: Diagnosis not present

## 2019-05-19 DIAGNOSIS — E039 Hypothyroidism, unspecified: Secondary | ICD-10-CM | POA: Diagnosis not present

## 2019-05-19 DIAGNOSIS — D631 Anemia in chronic kidney disease: Secondary | ICD-10-CM | POA: Diagnosis not present

## 2019-05-19 DIAGNOSIS — S0101XA Laceration without foreign body of scalp, initial encounter: Secondary | ICD-10-CM | POA: Diagnosis not present

## 2019-05-19 DIAGNOSIS — I4891 Unspecified atrial fibrillation: Secondary | ICD-10-CM | POA: Diagnosis not present

## 2019-05-19 DIAGNOSIS — R5381 Other malaise: Secondary | ICD-10-CM | POA: Diagnosis not present

## 2019-05-19 DIAGNOSIS — W19XXXA Unspecified fall, initial encounter: Secondary | ICD-10-CM | POA: Diagnosis not present

## 2019-05-19 DIAGNOSIS — S0990XA Unspecified injury of head, initial encounter: Secondary | ICD-10-CM | POA: Diagnosis not present

## 2019-05-19 DIAGNOSIS — D509 Iron deficiency anemia, unspecified: Secondary | ICD-10-CM | POA: Diagnosis not present

## 2019-05-19 DIAGNOSIS — Z79899 Other long term (current) drug therapy: Secondary | ICD-10-CM | POA: Diagnosis not present

## 2019-05-19 DIAGNOSIS — R69 Illness, unspecified: Secondary | ICD-10-CM | POA: Diagnosis not present

## 2019-05-19 DIAGNOSIS — M1990 Unspecified osteoarthritis, unspecified site: Secondary | ICD-10-CM | POA: Diagnosis not present

## 2019-05-19 DIAGNOSIS — Z885 Allergy status to narcotic agent status: Secondary | ICD-10-CM | POA: Diagnosis not present

## 2019-05-19 DIAGNOSIS — N2581 Secondary hyperparathyroidism of renal origin: Secondary | ICD-10-CM | POA: Diagnosis not present

## 2019-05-19 DIAGNOSIS — Z23 Encounter for immunization: Secondary | ICD-10-CM | POA: Diagnosis not present

## 2019-05-19 DIAGNOSIS — Z9049 Acquired absence of other specified parts of digestive tract: Secondary | ICD-10-CM | POA: Diagnosis not present

## 2019-05-20 DIAGNOSIS — D509 Iron deficiency anemia, unspecified: Secondary | ICD-10-CM | POA: Diagnosis not present

## 2019-05-20 DIAGNOSIS — N186 End stage renal disease: Secondary | ICD-10-CM | POA: Diagnosis not present

## 2019-05-20 DIAGNOSIS — Z992 Dependence on renal dialysis: Secondary | ICD-10-CM | POA: Diagnosis not present

## 2019-05-20 DIAGNOSIS — D631 Anemia in chronic kidney disease: Secondary | ICD-10-CM | POA: Diagnosis not present

## 2019-05-20 DIAGNOSIS — N2581 Secondary hyperparathyroidism of renal origin: Secondary | ICD-10-CM | POA: Diagnosis not present

## 2019-05-23 DIAGNOSIS — D631 Anemia in chronic kidney disease: Secondary | ICD-10-CM | POA: Diagnosis not present

## 2019-05-23 DIAGNOSIS — Z992 Dependence on renal dialysis: Secondary | ICD-10-CM | POA: Diagnosis not present

## 2019-05-23 DIAGNOSIS — N186 End stage renal disease: Secondary | ICD-10-CM | POA: Diagnosis not present

## 2019-05-23 DIAGNOSIS — N2581 Secondary hyperparathyroidism of renal origin: Secondary | ICD-10-CM | POA: Diagnosis not present

## 2019-05-23 DIAGNOSIS — D509 Iron deficiency anemia, unspecified: Secondary | ICD-10-CM | POA: Diagnosis not present

## 2019-05-25 DIAGNOSIS — N2581 Secondary hyperparathyroidism of renal origin: Secondary | ICD-10-CM | POA: Diagnosis not present

## 2019-05-25 DIAGNOSIS — D509 Iron deficiency anemia, unspecified: Secondary | ICD-10-CM | POA: Diagnosis not present

## 2019-05-25 DIAGNOSIS — D631 Anemia in chronic kidney disease: Secondary | ICD-10-CM | POA: Diagnosis not present

## 2019-05-25 DIAGNOSIS — N186 End stage renal disease: Secondary | ICD-10-CM | POA: Diagnosis not present

## 2019-05-25 DIAGNOSIS — Z992 Dependence on renal dialysis: Secondary | ICD-10-CM | POA: Diagnosis not present

## 2019-05-26 ENCOUNTER — Telehealth: Payer: Medicare Other | Admitting: Internal Medicine

## 2019-05-27 DIAGNOSIS — N186 End stage renal disease: Secondary | ICD-10-CM | POA: Diagnosis not present

## 2019-05-27 DIAGNOSIS — N2581 Secondary hyperparathyroidism of renal origin: Secondary | ICD-10-CM | POA: Diagnosis not present

## 2019-05-27 DIAGNOSIS — D509 Iron deficiency anemia, unspecified: Secondary | ICD-10-CM | POA: Diagnosis not present

## 2019-05-27 DIAGNOSIS — D631 Anemia in chronic kidney disease: Secondary | ICD-10-CM | POA: Diagnosis not present

## 2019-05-27 DIAGNOSIS — Z992 Dependence on renal dialysis: Secondary | ICD-10-CM | POA: Diagnosis not present

## 2019-05-29 ENCOUNTER — Other Ambulatory Visit: Payer: Self-pay

## 2019-05-29 ENCOUNTER — Telehealth (INDEPENDENT_AMBULATORY_CARE_PROVIDER_SITE_OTHER): Payer: Medicare Other | Admitting: Internal Medicine

## 2019-05-29 DIAGNOSIS — I495 Sick sinus syndrome: Secondary | ICD-10-CM | POA: Diagnosis not present

## 2019-05-29 DIAGNOSIS — I10 Essential (primary) hypertension: Secondary | ICD-10-CM | POA: Diagnosis not present

## 2019-05-29 DIAGNOSIS — I429 Cardiomyopathy, unspecified: Secondary | ICD-10-CM

## 2019-05-29 DIAGNOSIS — N186 End stage renal disease: Secondary | ICD-10-CM | POA: Diagnosis not present

## 2019-05-29 DIAGNOSIS — I4811 Longstanding persistent atrial fibrillation: Secondary | ICD-10-CM | POA: Diagnosis not present

## 2019-05-29 DIAGNOSIS — E039 Hypothyroidism, unspecified: Secondary | ICD-10-CM | POA: Diagnosis not present

## 2019-05-29 DIAGNOSIS — Z299 Encounter for prophylactic measures, unspecified: Secondary | ICD-10-CM | POA: Diagnosis not present

## 2019-05-29 DIAGNOSIS — I770 Arteriovenous fistula, acquired: Secondary | ICD-10-CM | POA: Diagnosis not present

## 2019-05-29 DIAGNOSIS — I4891 Unspecified atrial fibrillation: Secondary | ICD-10-CM | POA: Diagnosis not present

## 2019-05-29 NOTE — Progress Notes (Signed)
Electrophysiology TeleHealth Note  Due to national recommendations of social distancing due to [place] 19, an audio telehealth visit is felt to be most appropriate for this patient at this time.  Verbal consent was obtained by me for the telehealth visit today.  The patient does not have capability for a virtual visit.  A phone visit is therefore required today.   Date:  05/29/2019   ID:  Dale Torres, DOB 16-Feb-1925, MRN [id number]  Location: patient's home  Provider location:  [place]  Evaluation Performed: Follow-up visit  PCP:  Monico Blitz, MD   Electrophysiologist:  Dr Rayann Heman  Chief Complaint:  Pacemaker follow up  History of Present Illness:    Dale Torres is a 86 y.o. male who presents via telehealth conferencing today.  Since last being seen in our clinic, the patient reports doing very well.  The visit is done with the help of his son today who helps with history. Today, he denies symptoms of palpitations, chest pain, shortness of breath,  lower extremity edema, dizziness, presyncope, or syncope.  The patient is otherwise without complaint today.     Past Medical History:  Diagnosis Date  . Anemia    Dr. Sonny Dandy  . Arthritis   . Benign prostatic hypertrophy   . Carpal tunnel syndrome of left wrist   . Coronary atherosclerosis of native coronary artery    Nonobstructive 2009  . ESRD on hemodialysis ([place])   . Essential hypertension, benign   . GERD (gastroesophageal reflux disease)   . Headache   . History of pneumonia   . HOH (hard of hearing)    Bilateral  hearing aids  . Hypothyroidism   . Persistent atrial fibrillation (HCC)    Declines coumadin  . Presence of permanent cardiac pacemaker   . Sick sinus syndrome [facility])    Status post pacemaker placement 2008  . Skin cancer, basal cell     Past Surgical History:  Procedure Laterality Date  . A/V FISTULAGRAM N/A 01/06/2017   Procedure: A/V FISTULAGRAM;  Surgeon: Waynetta Sandy, MD;   Location: [place] [facility];  Service: Cardiovascular;  Laterality: N/A;  . APPENDECTOMY    . AV FISTULA PLACEMENT Left 08/16/2014   Procedure: Left Arm creation of Brachiocephalic ARTERIOVENOUS (AV) FISTULA ;  Surgeon: Angelia Mould, MD;  Location: [place];  Service: Vascular;  Laterality: Left;  . AV FISTULA PLACEMENT Right 06/24/2016   Procedure: CREATION OF RIGHT RADIOCEPHALIC ARTERIOVENOUS (AV);  Surgeon: Rosetta Posner, MD;  Location: [employer] [facility];  Service: Vascular;  Laterality: Right;  . CHOLECYSTECTOMY    . COLONOSCOPY W/ BIOPSIES AND POLYPECTOMY    . EYE SURGERY    . INSERTION OF DIALYSIS CATHETER Left 06/24/2016   Procedure: INSERTION OF DIALYSIS CATHETER - left Internal Jugular placement;  Surgeon: Rosetta Posner, MD;  Location: [place];  Service: Vascular;  Laterality: Left;  . IR FLUORO GUIDE CV LINE RIGHT  08/06/2016  . LAMINECTOMY    . LIGATION OF ARTERIOVENOUS  FISTULA Left 06/24/2016   Procedure: LIGATION OF left arm  ARTERIOVENOUS  FISTULA;  Surgeon: Rosetta Posner, MD;  Location: [place];  Service: Vascular;  Laterality: Left;  . PACEMAKER GENERATOR CHANGE  03/03/11   MDT Adaptal L implanted by Dr Rayann Heman  . PACEMAKER PLACEMENT  2008   Medtronic - Dr. Doreatha Lew  . PERIPHERAL VASCULAR CATHETERIZATION Left 03/06/2015   Procedure: Fistulagram;  Surgeon: Serafina Mitchell, MD;  Location: [place] [facility]  [facility];  Service: Cardiovascular;  Laterality: Left;  . PERMANENT PACEMAKER GENERATOR CHANGE N/A 03/03/2011   Procedure: PERMANENT PACEMAKER GENERATOR CHANGE;  Surgeon: Thompson Grayer, MD;  Location: [facility] CATH LAB;  Service: Cardiovascular;  Laterality: N/A;  . Right rotator cuff repair    . THROMBECTOMY AND REVISION OF ARTERIOVENTOUS (AV) GORETEX  GRAFT Left 03/11/2015   Procedure: THROMBECTOMY AND REVISION OF ARTERIOVENTOUS (AV) GORETEX  GRAFT LEFT ARM;  Surgeon: Rosetta Posner, MD;  Location: [place];  Service: Vascular;  Laterality: Left;  . TONSILLECTOMY      Current Outpatient Medications  Medication  Sig Dispense Refill  . amiodarone (PACERONE) 200 MG tablet Take 200 mg by mouth daily.    Marland Kitchen aspirin EC 81 MG tablet Take 81 mg by mouth at bedtime.    . carvedilol (COREG) 3.125 MG tablet TAKE ONE TABLET BY MOUTH TWICE DAILY. EXCEPT, DO NOT TAKE THE MORNING DOSE ON YOUR DIALYSIS DAYS. 180 tablet 3  . docusate sodium (COLACE) 100 MG capsule Take 100 mg by mouth daily.     . ferric citrate (AURYXIA) 1 GM 210 MG(Fe) tablet Take 1 tablet by mouth 3 (three) times daily with meals.    . folic acid (FOLVITE) 1 MG tablet Take 1 mg by mouth daily.      Marland Kitchen gabapentin (NEURONTIN) 400 MG capsule Take 400 mg by mouth at bedtime.     Marland Kitchen HYDROcodone-acetaminophen (NORCO) 10-325 MG tablet Take 1 tablet by mouth every 6 (six) hours as needed for severe pain. (Patient taking differently: Take 1 tablet by mouth 3 (three) times daily. ) 15 tablet 0  . levothyroxine (SYNTHROID, LEVOTHROID) 125 MCG tablet Take 125 mcg by mouth daily at 2 PM. In the afternoon.    . Melatonin 5 MG TABS Take 5 mg by mouth at bedtime.    . midodrine (PROAMATINE) 10 MG tablet Take 10 mg by mouth 2 (two) times daily.     . naproxen sodium (ALEVE) 220 MG tablet Take 440 mg by mouth daily as needed (for pain.).    Marland Kitchen nitroGLYCERIN (NITROSTAT) 0.4 MG SL tablet Place 1 tablet (0.4 mg total) under the tongue every 5 (five) minutes as needed for chest pain (up to 3 doses. If no relief after 3rd dose, proceed to the ED for an evaluation or call 911). 25 tablet 3  . omeprazole (PRILOSEC) 20 MG capsule Take 20 mg by mouth at bedtime.     . tamsulosin (FLOMAX) 0.4 MG CAPS capsule Take 0.4 mg by mouth at bedtime.     No current facility-administered medications for this visit.    Allergies:   Oxycodone hcl   Social History:  The patient  reports that he quit smoking about 59 years ago. His smoking use included cigarettes. He started smoking about 71 years ago. He has a 9.60 pack-year smoking history. He quit smokeless tobacco use about 39 years ago.   His smokeless tobacco use included chew. He reports current alcohol use. He reports that he does not use drugs.   ROS:  Please see the history of present illness.   All other systems are personally reviewed and negative.    Exam:    Vital Signs:  There were no vitals taken for this visit.  Well sounding, alert and conversant   Labs/Other Tests and Data Reviewed:    Recent Labs: No results found for requested labs within last 8760 hours.   Wt Readings from Last 3 Encounters:  10/28/18 163 lb 14.4 oz (  74.3 kg)  03/09/18 165 lb (74.8 kg)  11/03/17 163 lb 6.4 oz (74.1 kg)     Last device remote is reviewed from [place] PDF which reveals normal device function    ASSESSMENT & PLAN:    1.  Longstanding persistent atrial fibrillation Declines anticoagulation With advanced age and HD, risks of bleeding are high He has had recent falls requiring stitches as well  2.  Sinus bradycardia See recent [employer] report Consider reprogramming to VVIR at next in office visit Continue amiodarone for rate control - he is due for amiodarone labs - will check with Dr Manuella Ghazi to see if they can send over recent labwork.  Dr McDowell's recent note reviewed   3.  NICM Stable No change required today   Risks, benefits and potential toxicities for medications prescribed and/or refilled reviewed with patient today.   Follow-up:  Remotes, with me in a year   Patient Risk:  after full review of this patients clinical status, I feel that they are at moderate risk at this time.  Today, I have spent 15 minutes with the patient with telehealth technology discussing arrhythmia management .    Army Fossa, MD  05/29/2019 12:51 PM     [place] [address] [place] [address] [phone] (office) [phone] (fax)

## 2019-05-30 DIAGNOSIS — Z992 Dependence on renal dialysis: Secondary | ICD-10-CM | POA: Diagnosis not present

## 2019-05-30 DIAGNOSIS — D509 Iron deficiency anemia, unspecified: Secondary | ICD-10-CM | POA: Diagnosis not present

## 2019-05-30 DIAGNOSIS — D631 Anemia in chronic kidney disease: Secondary | ICD-10-CM | POA: Diagnosis not present

## 2019-05-30 DIAGNOSIS — N2581 Secondary hyperparathyroidism of renal origin: Secondary | ICD-10-CM | POA: Diagnosis not present

## 2019-05-30 DIAGNOSIS — N186 End stage renal disease: Secondary | ICD-10-CM | POA: Diagnosis not present

## 2019-06-01 DIAGNOSIS — N2581 Secondary hyperparathyroidism of renal origin: Secondary | ICD-10-CM | POA: Diagnosis not present

## 2019-06-01 DIAGNOSIS — D509 Iron deficiency anemia, unspecified: Secondary | ICD-10-CM | POA: Diagnosis not present

## 2019-06-01 DIAGNOSIS — N186 End stage renal disease: Secondary | ICD-10-CM | POA: Diagnosis not present

## 2019-06-01 DIAGNOSIS — Z992 Dependence on renal dialysis: Secondary | ICD-10-CM | POA: Diagnosis not present

## 2019-06-01 DIAGNOSIS — D631 Anemia in chronic kidney disease: Secondary | ICD-10-CM | POA: Diagnosis not present

## 2019-06-02 ENCOUNTER — Encounter: Payer: Self-pay | Admitting: *Deleted

## 2019-06-03 DIAGNOSIS — D631 Anemia in chronic kidney disease: Secondary | ICD-10-CM | POA: Diagnosis not present

## 2019-06-03 DIAGNOSIS — D509 Iron deficiency anemia, unspecified: Secondary | ICD-10-CM | POA: Diagnosis not present

## 2019-06-03 DIAGNOSIS — Z992 Dependence on renal dialysis: Secondary | ICD-10-CM | POA: Diagnosis not present

## 2019-06-03 DIAGNOSIS — N2581 Secondary hyperparathyroidism of renal origin: Secondary | ICD-10-CM | POA: Diagnosis not present

## 2019-06-03 DIAGNOSIS — N186 End stage renal disease: Secondary | ICD-10-CM | POA: Diagnosis not present

## 2019-06-06 DIAGNOSIS — N186 End stage renal disease: Secondary | ICD-10-CM | POA: Diagnosis not present

## 2019-06-06 DIAGNOSIS — D509 Iron deficiency anemia, unspecified: Secondary | ICD-10-CM | POA: Diagnosis not present

## 2019-06-06 DIAGNOSIS — Z992 Dependence on renal dialysis: Secondary | ICD-10-CM | POA: Diagnosis not present

## 2019-06-06 DIAGNOSIS — N2581 Secondary hyperparathyroidism of renal origin: Secondary | ICD-10-CM | POA: Diagnosis not present

## 2019-06-06 DIAGNOSIS — D631 Anemia in chronic kidney disease: Secondary | ICD-10-CM | POA: Diagnosis not present

## 2019-06-08 DIAGNOSIS — D509 Iron deficiency anemia, unspecified: Secondary | ICD-10-CM | POA: Diagnosis not present

## 2019-06-08 DIAGNOSIS — D631 Anemia in chronic kidney disease: Secondary | ICD-10-CM | POA: Diagnosis not present

## 2019-06-08 DIAGNOSIS — N2581 Secondary hyperparathyroidism of renal origin: Secondary | ICD-10-CM | POA: Diagnosis not present

## 2019-06-08 DIAGNOSIS — N186 End stage renal disease: Secondary | ICD-10-CM | POA: Diagnosis not present

## 2019-06-08 DIAGNOSIS — Z992 Dependence on renal dialysis: Secondary | ICD-10-CM | POA: Diagnosis not present

## 2019-06-10 DIAGNOSIS — D631 Anemia in chronic kidney disease: Secondary | ICD-10-CM | POA: Diagnosis not present

## 2019-06-10 DIAGNOSIS — N186 End stage renal disease: Secondary | ICD-10-CM | POA: Diagnosis not present

## 2019-06-10 DIAGNOSIS — Z992 Dependence on renal dialysis: Secondary | ICD-10-CM | POA: Diagnosis not present

## 2019-06-10 DIAGNOSIS — N2581 Secondary hyperparathyroidism of renal origin: Secondary | ICD-10-CM | POA: Diagnosis not present

## 2019-06-10 DIAGNOSIS — D509 Iron deficiency anemia, unspecified: Secondary | ICD-10-CM | POA: Diagnosis not present

## 2019-06-13 DIAGNOSIS — Z992 Dependence on renal dialysis: Secondary | ICD-10-CM | POA: Diagnosis not present

## 2019-06-13 DIAGNOSIS — D631 Anemia in chronic kidney disease: Secondary | ICD-10-CM | POA: Diagnosis not present

## 2019-06-13 DIAGNOSIS — N2581 Secondary hyperparathyroidism of renal origin: Secondary | ICD-10-CM | POA: Diagnosis not present

## 2019-06-13 DIAGNOSIS — D509 Iron deficiency anemia, unspecified: Secondary | ICD-10-CM | POA: Diagnosis not present

## 2019-06-13 DIAGNOSIS — N186 End stage renal disease: Secondary | ICD-10-CM | POA: Diagnosis not present

## 2019-06-15 DIAGNOSIS — D509 Iron deficiency anemia, unspecified: Secondary | ICD-10-CM | POA: Diagnosis not present

## 2019-06-15 DIAGNOSIS — N2581 Secondary hyperparathyroidism of renal origin: Secondary | ICD-10-CM | POA: Diagnosis not present

## 2019-06-15 DIAGNOSIS — N186 End stage renal disease: Secondary | ICD-10-CM | POA: Diagnosis not present

## 2019-06-15 DIAGNOSIS — D631 Anemia in chronic kidney disease: Secondary | ICD-10-CM | POA: Diagnosis not present

## 2019-06-15 DIAGNOSIS — Z992 Dependence on renal dialysis: Secondary | ICD-10-CM | POA: Diagnosis not present

## 2019-06-16 DIAGNOSIS — M549 Dorsalgia, unspecified: Secondary | ICD-10-CM | POA: Diagnosis not present

## 2019-06-16 DIAGNOSIS — I770 Arteriovenous fistula, acquired: Secondary | ICD-10-CM | POA: Diagnosis not present

## 2019-06-16 DIAGNOSIS — N186 End stage renal disease: Secondary | ICD-10-CM | POA: Diagnosis not present

## 2019-06-16 DIAGNOSIS — Z299 Encounter for prophylactic measures, unspecified: Secondary | ICD-10-CM | POA: Diagnosis not present

## 2019-06-17 DIAGNOSIS — N186 End stage renal disease: Secondary | ICD-10-CM | POA: Diagnosis not present

## 2019-06-17 DIAGNOSIS — D631 Anemia in chronic kidney disease: Secondary | ICD-10-CM | POA: Diagnosis not present

## 2019-06-17 DIAGNOSIS — D509 Iron deficiency anemia, unspecified: Secondary | ICD-10-CM | POA: Diagnosis not present

## 2019-06-17 DIAGNOSIS — N2581 Secondary hyperparathyroidism of renal origin: Secondary | ICD-10-CM | POA: Diagnosis not present

## 2019-06-17 DIAGNOSIS — Z992 Dependence on renal dialysis: Secondary | ICD-10-CM | POA: Diagnosis not present

## 2019-06-19 DIAGNOSIS — Z992 Dependence on renal dialysis: Secondary | ICD-10-CM | POA: Diagnosis not present

## 2019-06-19 DIAGNOSIS — N186 End stage renal disease: Secondary | ICD-10-CM | POA: Diagnosis not present

## 2019-06-20 DIAGNOSIS — Z992 Dependence on renal dialysis: Secondary | ICD-10-CM | POA: Diagnosis not present

## 2019-06-20 DIAGNOSIS — N186 End stage renal disease: Secondary | ICD-10-CM | POA: Diagnosis not present

## 2019-06-20 DIAGNOSIS — D509 Iron deficiency anemia, unspecified: Secondary | ICD-10-CM | POA: Diagnosis not present

## 2019-06-20 DIAGNOSIS — D631 Anemia in chronic kidney disease: Secondary | ICD-10-CM | POA: Diagnosis not present

## 2019-06-20 DIAGNOSIS — N2581 Secondary hyperparathyroidism of renal origin: Secondary | ICD-10-CM | POA: Diagnosis not present

## 2019-06-22 DIAGNOSIS — N186 End stage renal disease: Secondary | ICD-10-CM | POA: Diagnosis not present

## 2019-06-22 DIAGNOSIS — Z992 Dependence on renal dialysis: Secondary | ICD-10-CM | POA: Diagnosis not present

## 2019-06-22 DIAGNOSIS — D631 Anemia in chronic kidney disease: Secondary | ICD-10-CM | POA: Diagnosis not present

## 2019-06-22 DIAGNOSIS — N2581 Secondary hyperparathyroidism of renal origin: Secondary | ICD-10-CM | POA: Diagnosis not present

## 2019-06-22 DIAGNOSIS — D509 Iron deficiency anemia, unspecified: Secondary | ICD-10-CM | POA: Diagnosis not present

## 2019-06-24 DIAGNOSIS — N186 End stage renal disease: Secondary | ICD-10-CM | POA: Diagnosis not present

## 2019-06-24 DIAGNOSIS — Z992 Dependence on renal dialysis: Secondary | ICD-10-CM | POA: Diagnosis not present

## 2019-06-24 DIAGNOSIS — N2581 Secondary hyperparathyroidism of renal origin: Secondary | ICD-10-CM | POA: Diagnosis not present

## 2019-06-24 DIAGNOSIS — D509 Iron deficiency anemia, unspecified: Secondary | ICD-10-CM | POA: Diagnosis not present

## 2019-06-24 DIAGNOSIS — D631 Anemia in chronic kidney disease: Secondary | ICD-10-CM | POA: Diagnosis not present

## 2019-06-27 DIAGNOSIS — N186 End stage renal disease: Secondary | ICD-10-CM | POA: Diagnosis not present

## 2019-06-27 DIAGNOSIS — N2581 Secondary hyperparathyroidism of renal origin: Secondary | ICD-10-CM | POA: Diagnosis not present

## 2019-06-27 DIAGNOSIS — D631 Anemia in chronic kidney disease: Secondary | ICD-10-CM | POA: Diagnosis not present

## 2019-06-27 DIAGNOSIS — D509 Iron deficiency anemia, unspecified: Secondary | ICD-10-CM | POA: Diagnosis not present

## 2019-06-27 DIAGNOSIS — Z992 Dependence on renal dialysis: Secondary | ICD-10-CM | POA: Diagnosis not present

## 2019-06-29 DIAGNOSIS — Z992 Dependence on renal dialysis: Secondary | ICD-10-CM | POA: Diagnosis not present

## 2019-06-29 DIAGNOSIS — N186 End stage renal disease: Secondary | ICD-10-CM | POA: Diagnosis not present

## 2019-06-29 DIAGNOSIS — D509 Iron deficiency anemia, unspecified: Secondary | ICD-10-CM | POA: Diagnosis not present

## 2019-06-29 DIAGNOSIS — D631 Anemia in chronic kidney disease: Secondary | ICD-10-CM | POA: Diagnosis not present

## 2019-06-29 DIAGNOSIS — N2581 Secondary hyperparathyroidism of renal origin: Secondary | ICD-10-CM | POA: Diagnosis not present

## 2019-07-01 DIAGNOSIS — N2581 Secondary hyperparathyroidism of renal origin: Secondary | ICD-10-CM | POA: Diagnosis not present

## 2019-07-01 DIAGNOSIS — Z992 Dependence on renal dialysis: Secondary | ICD-10-CM | POA: Diagnosis not present

## 2019-07-01 DIAGNOSIS — D509 Iron deficiency anemia, unspecified: Secondary | ICD-10-CM | POA: Diagnosis not present

## 2019-07-01 DIAGNOSIS — D631 Anemia in chronic kidney disease: Secondary | ICD-10-CM | POA: Diagnosis not present

## 2019-07-01 DIAGNOSIS — N186 End stage renal disease: Secondary | ICD-10-CM | POA: Diagnosis not present

## 2019-07-04 DIAGNOSIS — D509 Iron deficiency anemia, unspecified: Secondary | ICD-10-CM | POA: Diagnosis not present

## 2019-07-04 DIAGNOSIS — D631 Anemia in chronic kidney disease: Secondary | ICD-10-CM | POA: Diagnosis not present

## 2019-07-04 DIAGNOSIS — N186 End stage renal disease: Secondary | ICD-10-CM | POA: Diagnosis not present

## 2019-07-04 DIAGNOSIS — Z992 Dependence on renal dialysis: Secondary | ICD-10-CM | POA: Diagnosis not present

## 2019-07-04 DIAGNOSIS — N2581 Secondary hyperparathyroidism of renal origin: Secondary | ICD-10-CM | POA: Diagnosis not present

## 2019-07-06 DIAGNOSIS — D509 Iron deficiency anemia, unspecified: Secondary | ICD-10-CM | POA: Diagnosis not present

## 2019-07-06 DIAGNOSIS — D631 Anemia in chronic kidney disease: Secondary | ICD-10-CM | POA: Diagnosis not present

## 2019-07-06 DIAGNOSIS — N2581 Secondary hyperparathyroidism of renal origin: Secondary | ICD-10-CM | POA: Diagnosis not present

## 2019-07-06 DIAGNOSIS — Z992 Dependence on renal dialysis: Secondary | ICD-10-CM | POA: Diagnosis not present

## 2019-07-06 DIAGNOSIS — N186 End stage renal disease: Secondary | ICD-10-CM | POA: Diagnosis not present

## 2019-07-08 DIAGNOSIS — N2581 Secondary hyperparathyroidism of renal origin: Secondary | ICD-10-CM | POA: Diagnosis not present

## 2019-07-08 DIAGNOSIS — D509 Iron deficiency anemia, unspecified: Secondary | ICD-10-CM | POA: Diagnosis not present

## 2019-07-08 DIAGNOSIS — N186 End stage renal disease: Secondary | ICD-10-CM | POA: Diagnosis not present

## 2019-07-08 DIAGNOSIS — Z992 Dependence on renal dialysis: Secondary | ICD-10-CM | POA: Diagnosis not present

## 2019-07-08 DIAGNOSIS — D631 Anemia in chronic kidney disease: Secondary | ICD-10-CM | POA: Diagnosis not present

## 2019-07-11 DIAGNOSIS — N186 End stage renal disease: Secondary | ICD-10-CM | POA: Diagnosis not present

## 2019-07-11 DIAGNOSIS — D631 Anemia in chronic kidney disease: Secondary | ICD-10-CM | POA: Diagnosis not present

## 2019-07-11 DIAGNOSIS — D509 Iron deficiency anemia, unspecified: Secondary | ICD-10-CM | POA: Diagnosis not present

## 2019-07-11 DIAGNOSIS — Z992 Dependence on renal dialysis: Secondary | ICD-10-CM | POA: Diagnosis not present

## 2019-07-11 DIAGNOSIS — N2581 Secondary hyperparathyroidism of renal origin: Secondary | ICD-10-CM | POA: Diagnosis not present

## 2019-07-12 ENCOUNTER — Other Ambulatory Visit: Payer: Self-pay

## 2019-07-12 ENCOUNTER — Ambulatory Visit (INDEPENDENT_AMBULATORY_CARE_PROVIDER_SITE_OTHER): Payer: Medicare Other | Admitting: Cardiology

## 2019-07-12 ENCOUNTER — Encounter: Payer: Self-pay | Admitting: Cardiology

## 2019-07-12 VITALS — BP 114/58 | HR 74 | Ht 68.0 in | Wt 169.6 lb

## 2019-07-12 DIAGNOSIS — Z992 Dependence on renal dialysis: Secondary | ICD-10-CM

## 2019-07-12 DIAGNOSIS — N186 End stage renal disease: Secondary | ICD-10-CM

## 2019-07-12 DIAGNOSIS — I429 Cardiomyopathy, unspecified: Secondary | ICD-10-CM

## 2019-07-12 DIAGNOSIS — I4821 Permanent atrial fibrillation: Secondary | ICD-10-CM

## 2019-07-12 NOTE — Patient Instructions (Signed)

## 2019-07-12 NOTE — Progress Notes (Signed)
Cardiology Office Note  Date: 07/12/2019   ID: Dale Torres, Dale Torres 03-28-1925, MRN [id number]  PCP:  Monico Blitz, MD  Cardiologist:  Rozann Lesches, MD Electrophysiologist:  None   Chief Complaint  Patient presents with  . Cardiac follow-up    History of Present Illness: Dale Torres is a 86 y.o. male last assessed via telehealth encounter in October 2020.  He is here today for a routine visit.  Overall no major change, states that he feels a little bit "slower," but still functional with ADLs, using a rolling walker.  He continues to tolerate hemodialysis.  He does not report any chest pain or palpitations.  He sees Dr. Rayann Heman, Medtronic pacemaker in place.  Last device check in April indicated normal function.  I reviewed his medications which are stable from a cardiac perspective and outlined below.  Blood pressure is low normal today as is typically the case.  His lab work in September of last year showed normal TSH and LFTs.  Past Medical History:  Diagnosis Date  . Anemia    Dr. Sonny Dandy  . Arthritis   . Benign prostatic hypertrophy   . Carpal tunnel syndrome of left wrist   . Coronary atherosclerosis of native coronary artery    Nonobstructive 2009  . ESRD on hemodialysis ([place])   . Essential hypertension   . GERD (gastroesophageal reflux disease)   . Headache   . History of pneumonia   . HOH (hard of hearing)    Bilateral  hearing aids  . Hypothyroidism   . Persistent atrial fibrillation (HCC)    Declines coumadin  . Presence of permanent cardiac pacemaker   . Sick sinus syndrome [facility])    Status post pacemaker placement 2008  . Skin cancer, basal cell     Past Surgical History:  Procedure Laterality Date  . A/V FISTULAGRAM N/A 01/06/2017   Procedure: A/V FISTULAGRAM;  Surgeon: Waynetta Sandy, MD;  Location: [place] [facility];  Service: Cardiovascular;  Laterality: N/A;  . APPENDECTOMY    . AV FISTULA PLACEMENT Left 08/16/2014   Procedure: Left  Arm creation of Brachiocephalic ARTERIOVENOUS (AV) FISTULA ;  Surgeon: Angelia Mould, MD;  Location: [place];  Service: Vascular;  Laterality: Left;  . AV FISTULA PLACEMENT Right 06/24/2016   Procedure: CREATION OF RIGHT RADIOCEPHALIC ARTERIOVENOUS (AV);  Surgeon: Rosetta Posner, MD;  Location: [place];  Service: Vascular;  Laterality: Right;  . CHOLECYSTECTOMY    . COLONOSCOPY W/ BIOPSIES AND POLYPECTOMY    . EYE SURGERY    . INSERTION OF DIALYSIS CATHETER Left 06/24/2016   Procedure: INSERTION OF DIALYSIS CATHETER - left Internal Jugular placement;  Surgeon: Rosetta Posner, MD;  Location: [place];  Service: Vascular;  Laterality: Left;  . IR FLUORO GUIDE CV LINE RIGHT  08/06/2016  . LAMINECTOMY    . LIGATION OF ARTERIOVENOUS  FISTULA Left 06/24/2016   Procedure: LIGATION OF left arm  ARTERIOVENOUS  FISTULA;  Surgeon: Rosetta Posner, MD;  Location: [place];  Service: Vascular;  Laterality: Left;  . PACEMAKER GENERATOR CHANGE  03/03/11   MDT Adaptal L implanted by Dr Rayann Heman  . PACEMAKER PLACEMENT  2008   [employer] - Dr. Doreatha Lew  . PERIPHERAL VASCULAR CATHETERIZATION Left 03/06/2015   Procedure: Fistulagram;  Surgeon: Serafina Mitchell, MD;  Location: [place] [facility];  Service: Cardiovascular;  Laterality: Left;  . PERMANENT PACEMAKER GENERATOR CHANGE N/A 03/03/2011   Procedure: PERMANENT PACEMAKER GENERATOR CHANGE;  Surgeon:  Thompson Grayer, MD;  Location: [facility] CATH LAB;  Service: Cardiovascular;  Laterality: N/A;  . Right rotator cuff repair    . THROMBECTOMY AND REVISION OF ARTERIOVENTOUS (AV) GORETEX  GRAFT Left 03/11/2015   Procedure: THROMBECTOMY AND REVISION OF ARTERIOVENTOUS (AV) GORETEX  GRAFT LEFT ARM;  Surgeon: Rosetta Posner, MD;  Location: [place];  Service: Vascular;  Laterality: Left;  . TONSILLECTOMY      Current Outpatient Medications  Medication Sig Dispense Refill  . amiodarone (PACERONE) 200 MG tablet Take 200 mg by mouth daily.    Marland Kitchen aspirin EC 81 MG tablet Take 81 mg by mouth at bedtime.     . carvedilol (COREG) 3.125 MG tablet TAKE ONE TABLET BY MOUTH TWICE DAILY. EXCEPT, DO NOT TAKE THE MORNING DOSE ON YOUR DIALYSIS DAYS. 180 tablet 3  . docusate sodium (COLACE) 100 MG capsule Take 100 mg by mouth daily.     . ferric citrate (AURYXIA) 1 GM 210 MG(Fe) tablet Take 1 tablet by mouth 3 (three) times daily with meals.    . folic acid (FOLVITE) 1 MG tablet Take 1 mg by mouth daily.      Marland Kitchen gabapentin (NEURONTIN) 400 MG capsule Take 400 mg by mouth at bedtime.     Marland Kitchen HYDROcodone-acetaminophen (NORCO) 10-325 MG tablet Take 1 tablet by mouth every 6 (six) hours as needed for severe pain. (Patient taking differently: Take 1 tablet by mouth 3 (three) times daily. ) 15 tablet 0  . levothyroxine (SYNTHROID, LEVOTHROID) 125 MCG tablet Take 125 mcg by mouth daily at 2 PM. In the afternoon.    . Melatonin 5 MG TABS Take 5 mg by mouth at bedtime.    . midodrine (PROAMATINE) 10 MG tablet Take 10 mg by mouth 2 (two) times daily.     . naproxen sodium (ALEVE) 220 MG tablet Take 440 mg by mouth daily as needed (for pain.).    Marland Kitchen nitroGLYCERIN (NITROSTAT) 0.4 MG SL tablet Place 1 tablet (0.4 mg total) under the tongue every 5 (five) minutes as needed for chest pain (up to 3 doses. If no relief after 3rd dose, proceed to the ED for an evaluation or call 911). 25 tablet 3  . omeprazole (PRILOSEC) 20 MG capsule Take 20 mg by mouth at bedtime.     . tamsulosin (FLOMAX) 0.4 MG CAPS capsule Take 0.4 mg by mouth at bedtime.     No current facility-administered medications for this visit.   Allergies:  Oxycodone hcl   ROS:   Hearing loss.  Physical Exam: VS:  BP (!) 114/58   Pulse 74   Ht _0  (1.727 m)   Wt 169 lb 9.6 oz (76.9 kg)   BMI 25.79 kg/m , BMI Body mass index is 25.79 kg/m.  Wt Readings from Last 3 Encounters:  07/12/19 169 lb 9.6 oz (76.9 kg)  10/28/18 163 lb 14.4 oz (74.3 kg)  03/09/18 165 lb (74.8 kg)    General: Elderly male, using a rolling walker.  Patient appears comfortable at  rest. HEENT: Conjunctiva and lids normal, wearing a mask. Neck: Supple, no elevated JVP or carotid bruits, no thyromegaly. Lungs: Clear to auscultation, nonlabored breathing at rest. Cardiac: Regular rate and rhythm, no S3, soft systolic murmur, no pericardial rub. Extremities: No pitting edema, distal pulses 2+.  ECG:  An ECG dated 03/09/2018 was personally reviewed today and demonstrated:  Atrial fibrillation with left bundle branch block.  Recent Labwork:  September 2020: Hemoglobin 12.1, platelets 152, TSH 1.34,  cholesterol 166, triglycerides 45, HDL 60, LDL 97, BUN 33, creatinine 5.18, potassium 4.7, AST 15, ALT 9  Other Studies Reviewed Today:  Echocardiogram 06/06/2017 [facility]): LVEF 25 to 30%, MAC with moderate mitral regurgitation, moderate left atrial enlargement, right ventricular dilatation with decreased contraction, mild to moderate tricuspid regurgitation, mild right atrial enlargement.  Assessment and Plan:  1.  Persistent atrial fibrillation.  He is on aspirin and has consistently declined anticoagulation.  We have left him on amiodarone as a rate control strategy along with low-dose Coreg.  He runs a low blood pressure at baseline, further exacerbated with ESRD and requiring ProAmatine.  ECG shows ventricular pacing today.  TSH and LFTs were normal at last check.  2.  Medtronic pacemaker in place with follow-up by Dr. Rayann Heman.  3.  Secondary cardiomyopathy with LVEF 25 to 30%.  Continue conservative management.  Medication Adjustments/Labs and Tests Ordered: Current medicines are reviewed at length with the patient today.  Concerns regarding medicines are outlined above.   Tests Ordered: Orders Placed This Encounter  Procedures  . EKG 12-Lead    Medication Changes: No orders of the defined types were placed in this encounter.   Disposition:  Follow up 6 months in the [place] office.  Signed, Satira Sark, MD, [facility] 07/12/2019 2:24 PM      [place] at [place], [place], [place] [address] Phone: [phone]; Fax: [phone]

## 2019-07-13 DIAGNOSIS — D509 Iron deficiency anemia, unspecified: Secondary | ICD-10-CM | POA: Diagnosis not present

## 2019-07-13 DIAGNOSIS — N2581 Secondary hyperparathyroidism of renal origin: Secondary | ICD-10-CM | POA: Diagnosis not present

## 2019-07-13 DIAGNOSIS — D631 Anemia in chronic kidney disease: Secondary | ICD-10-CM | POA: Diagnosis not present

## 2019-07-13 DIAGNOSIS — N186 End stage renal disease: Secondary | ICD-10-CM | POA: Diagnosis not present

## 2019-07-13 DIAGNOSIS — Z992 Dependence on renal dialysis: Secondary | ICD-10-CM | POA: Diagnosis not present

## 2019-07-14 ENCOUNTER — Ambulatory Visit: Payer: Medicare Other | Admitting: Cardiology

## 2019-07-15 DIAGNOSIS — N2581 Secondary hyperparathyroidism of renal origin: Secondary | ICD-10-CM | POA: Diagnosis not present

## 2019-07-15 DIAGNOSIS — D631 Anemia in chronic kidney disease: Secondary | ICD-10-CM | POA: Diagnosis not present

## 2019-07-15 DIAGNOSIS — Z992 Dependence on renal dialysis: Secondary | ICD-10-CM | POA: Diagnosis not present

## 2019-07-15 DIAGNOSIS — D509 Iron deficiency anemia, unspecified: Secondary | ICD-10-CM | POA: Diagnosis not present

## 2019-07-15 DIAGNOSIS — N186 End stage renal disease: Secondary | ICD-10-CM | POA: Diagnosis not present

## 2019-07-18 DIAGNOSIS — N2581 Secondary hyperparathyroidism of renal origin: Secondary | ICD-10-CM | POA: Diagnosis not present

## 2019-07-18 DIAGNOSIS — N186 End stage renal disease: Secondary | ICD-10-CM | POA: Diagnosis not present

## 2019-07-18 DIAGNOSIS — D509 Iron deficiency anemia, unspecified: Secondary | ICD-10-CM | POA: Diagnosis not present

## 2019-07-18 DIAGNOSIS — Z992 Dependence on renal dialysis: Secondary | ICD-10-CM | POA: Diagnosis not present

## 2019-07-18 DIAGNOSIS — D631 Anemia in chronic kidney disease: Secondary | ICD-10-CM | POA: Diagnosis not present

## 2019-07-20 DIAGNOSIS — N2581 Secondary hyperparathyroidism of renal origin: Secondary | ICD-10-CM | POA: Diagnosis not present

## 2019-07-20 DIAGNOSIS — D631 Anemia in chronic kidney disease: Secondary | ICD-10-CM | POA: Diagnosis not present

## 2019-07-20 DIAGNOSIS — N186 End stage renal disease: Secondary | ICD-10-CM | POA: Diagnosis not present

## 2019-07-20 DIAGNOSIS — Z992 Dependence on renal dialysis: Secondary | ICD-10-CM | POA: Diagnosis not present

## 2019-07-20 DIAGNOSIS — D509 Iron deficiency anemia, unspecified: Secondary | ICD-10-CM | POA: Diagnosis not present

## 2019-07-22 DIAGNOSIS — N2581 Secondary hyperparathyroidism of renal origin: Secondary | ICD-10-CM | POA: Diagnosis not present

## 2019-07-22 DIAGNOSIS — D631 Anemia in chronic kidney disease: Secondary | ICD-10-CM | POA: Diagnosis not present

## 2019-07-22 DIAGNOSIS — N186 End stage renal disease: Secondary | ICD-10-CM | POA: Diagnosis not present

## 2019-07-22 DIAGNOSIS — D509 Iron deficiency anemia, unspecified: Secondary | ICD-10-CM | POA: Diagnosis not present

## 2019-07-22 DIAGNOSIS — Z992 Dependence on renal dialysis: Secondary | ICD-10-CM | POA: Diagnosis not present

## 2019-07-25 DIAGNOSIS — D631 Anemia in chronic kidney disease: Secondary | ICD-10-CM | POA: Diagnosis not present

## 2019-07-25 DIAGNOSIS — N2581 Secondary hyperparathyroidism of renal origin: Secondary | ICD-10-CM | POA: Diagnosis not present

## 2019-07-25 DIAGNOSIS — D509 Iron deficiency anemia, unspecified: Secondary | ICD-10-CM | POA: Diagnosis not present

## 2019-07-25 DIAGNOSIS — Z992 Dependence on renal dialysis: Secondary | ICD-10-CM | POA: Diagnosis not present

## 2019-07-25 DIAGNOSIS — N186 End stage renal disease: Secondary | ICD-10-CM | POA: Diagnosis not present

## 2019-07-27 DIAGNOSIS — N2581 Secondary hyperparathyroidism of renal origin: Secondary | ICD-10-CM | POA: Diagnosis not present

## 2019-07-27 DIAGNOSIS — Z992 Dependence on renal dialysis: Secondary | ICD-10-CM | POA: Diagnosis not present

## 2019-07-27 DIAGNOSIS — D509 Iron deficiency anemia, unspecified: Secondary | ICD-10-CM | POA: Diagnosis not present

## 2019-07-27 DIAGNOSIS — N186 End stage renal disease: Secondary | ICD-10-CM | POA: Diagnosis not present

## 2019-07-27 DIAGNOSIS — D631 Anemia in chronic kidney disease: Secondary | ICD-10-CM | POA: Diagnosis not present

## 2019-07-29 DIAGNOSIS — D509 Iron deficiency anemia, unspecified: Secondary | ICD-10-CM | POA: Diagnosis not present

## 2019-07-29 DIAGNOSIS — N186 End stage renal disease: Secondary | ICD-10-CM | POA: Diagnosis not present

## 2019-07-29 DIAGNOSIS — D631 Anemia in chronic kidney disease: Secondary | ICD-10-CM | POA: Diagnosis not present

## 2019-07-29 DIAGNOSIS — Z992 Dependence on renal dialysis: Secondary | ICD-10-CM | POA: Diagnosis not present

## 2019-07-29 DIAGNOSIS — N2581 Secondary hyperparathyroidism of renal origin: Secondary | ICD-10-CM | POA: Diagnosis not present

## 2019-08-01 DIAGNOSIS — D509 Iron deficiency anemia, unspecified: Secondary | ICD-10-CM | POA: Diagnosis not present

## 2019-08-01 DIAGNOSIS — D631 Anemia in chronic kidney disease: Secondary | ICD-10-CM | POA: Diagnosis not present

## 2019-08-01 DIAGNOSIS — N2581 Secondary hyperparathyroidism of renal origin: Secondary | ICD-10-CM | POA: Diagnosis not present

## 2019-08-01 DIAGNOSIS — N186 End stage renal disease: Secondary | ICD-10-CM | POA: Diagnosis not present

## 2019-08-01 DIAGNOSIS — Z992 Dependence on renal dialysis: Secondary | ICD-10-CM | POA: Diagnosis not present

## 2019-08-03 DIAGNOSIS — Z992 Dependence on renal dialysis: Secondary | ICD-10-CM | POA: Diagnosis not present

## 2019-08-03 DIAGNOSIS — D631 Anemia in chronic kidney disease: Secondary | ICD-10-CM | POA: Diagnosis not present

## 2019-08-03 DIAGNOSIS — N2581 Secondary hyperparathyroidism of renal origin: Secondary | ICD-10-CM | POA: Diagnosis not present

## 2019-08-03 DIAGNOSIS — N186 End stage renal disease: Secondary | ICD-10-CM | POA: Diagnosis not present

## 2019-08-03 DIAGNOSIS — D509 Iron deficiency anemia, unspecified: Secondary | ICD-10-CM | POA: Diagnosis not present

## 2019-08-05 DIAGNOSIS — D631 Anemia in chronic kidney disease: Secondary | ICD-10-CM | POA: Diagnosis not present

## 2019-08-05 DIAGNOSIS — N2581 Secondary hyperparathyroidism of renal origin: Secondary | ICD-10-CM | POA: Diagnosis not present

## 2019-08-05 DIAGNOSIS — N186 End stage renal disease: Secondary | ICD-10-CM | POA: Diagnosis not present

## 2019-08-05 DIAGNOSIS — Z992 Dependence on renal dialysis: Secondary | ICD-10-CM | POA: Diagnosis not present

## 2019-08-05 DIAGNOSIS — D509 Iron deficiency anemia, unspecified: Secondary | ICD-10-CM | POA: Diagnosis not present

## 2019-08-08 DIAGNOSIS — D631 Anemia in chronic kidney disease: Secondary | ICD-10-CM | POA: Diagnosis not present

## 2019-08-08 DIAGNOSIS — N2581 Secondary hyperparathyroidism of renal origin: Secondary | ICD-10-CM | POA: Diagnosis not present

## 2019-08-08 DIAGNOSIS — Z992 Dependence on renal dialysis: Secondary | ICD-10-CM | POA: Diagnosis not present

## 2019-08-08 DIAGNOSIS — D509 Iron deficiency anemia, unspecified: Secondary | ICD-10-CM | POA: Diagnosis not present

## 2019-08-08 DIAGNOSIS — N186 End stage renal disease: Secondary | ICD-10-CM | POA: Diagnosis not present

## 2019-08-10 DIAGNOSIS — D631 Anemia in chronic kidney disease: Secondary | ICD-10-CM | POA: Diagnosis not present

## 2019-08-10 DIAGNOSIS — Z992 Dependence on renal dialysis: Secondary | ICD-10-CM | POA: Diagnosis not present

## 2019-08-10 DIAGNOSIS — D509 Iron deficiency anemia, unspecified: Secondary | ICD-10-CM | POA: Diagnosis not present

## 2019-08-10 DIAGNOSIS — N2581 Secondary hyperparathyroidism of renal origin: Secondary | ICD-10-CM | POA: Diagnosis not present

## 2019-08-10 DIAGNOSIS — N186 End stage renal disease: Secondary | ICD-10-CM | POA: Diagnosis not present

## 2019-08-11 DIAGNOSIS — Z992 Dependence on renal dialysis: Secondary | ICD-10-CM | POA: Diagnosis not present

## 2019-08-11 DIAGNOSIS — Z299 Encounter for prophylactic measures, unspecified: Secondary | ICD-10-CM | POA: Diagnosis not present

## 2019-08-11 DIAGNOSIS — Z789 Other specified health status: Secondary | ICD-10-CM | POA: Diagnosis not present

## 2019-08-11 DIAGNOSIS — M549 Dorsalgia, unspecified: Secondary | ICD-10-CM | POA: Diagnosis not present

## 2019-08-11 DIAGNOSIS — N186 End stage renal disease: Secondary | ICD-10-CM | POA: Diagnosis not present

## 2019-08-11 DIAGNOSIS — I4891 Unspecified atrial fibrillation: Secondary | ICD-10-CM | POA: Diagnosis not present

## 2019-08-12 DIAGNOSIS — D631 Anemia in chronic kidney disease: Secondary | ICD-10-CM | POA: Diagnosis not present

## 2019-08-12 DIAGNOSIS — Z992 Dependence on renal dialysis: Secondary | ICD-10-CM | POA: Diagnosis not present

## 2019-08-12 DIAGNOSIS — N186 End stage renal disease: Secondary | ICD-10-CM | POA: Diagnosis not present

## 2019-08-12 DIAGNOSIS — D509 Iron deficiency anemia, unspecified: Secondary | ICD-10-CM | POA: Diagnosis not present

## 2019-08-12 DIAGNOSIS — N2581 Secondary hyperparathyroidism of renal origin: Secondary | ICD-10-CM | POA: Diagnosis not present

## 2019-08-15 DIAGNOSIS — N2581 Secondary hyperparathyroidism of renal origin: Secondary | ICD-10-CM | POA: Diagnosis not present

## 2019-08-15 DIAGNOSIS — D631 Anemia in chronic kidney disease: Secondary | ICD-10-CM | POA: Diagnosis not present

## 2019-08-15 DIAGNOSIS — D509 Iron deficiency anemia, unspecified: Secondary | ICD-10-CM | POA: Diagnosis not present

## 2019-08-15 DIAGNOSIS — N186 End stage renal disease: Secondary | ICD-10-CM | POA: Diagnosis not present

## 2019-08-15 DIAGNOSIS — Z992 Dependence on renal dialysis: Secondary | ICD-10-CM | POA: Diagnosis not present

## 2019-08-17 DIAGNOSIS — D509 Iron deficiency anemia, unspecified: Secondary | ICD-10-CM | POA: Diagnosis not present

## 2019-08-17 DIAGNOSIS — Z992 Dependence on renal dialysis: Secondary | ICD-10-CM | POA: Diagnosis not present

## 2019-08-17 DIAGNOSIS — D631 Anemia in chronic kidney disease: Secondary | ICD-10-CM | POA: Diagnosis not present

## 2019-08-17 DIAGNOSIS — N2581 Secondary hyperparathyroidism of renal origin: Secondary | ICD-10-CM | POA: Diagnosis not present

## 2019-08-17 DIAGNOSIS — N186 End stage renal disease: Secondary | ICD-10-CM | POA: Diagnosis not present

## 2019-08-18 DIAGNOSIS — K219 Gastro-esophageal reflux disease without esophagitis: Secondary | ICD-10-CM | POA: Diagnosis not present

## 2019-08-18 DIAGNOSIS — N185 Chronic kidney disease, stage 5: Secondary | ICD-10-CM | POA: Diagnosis not present

## 2019-08-18 DIAGNOSIS — I129 Hypertensive chronic kidney disease with stage 1 through stage 4 chronic kidney disease, or unspecified chronic kidney disease: Secondary | ICD-10-CM | POA: Diagnosis not present

## 2019-08-18 DIAGNOSIS — E785 Hyperlipidemia, unspecified: Secondary | ICD-10-CM | POA: Diagnosis not present

## 2019-08-19 DIAGNOSIS — D631 Anemia in chronic kidney disease: Secondary | ICD-10-CM | POA: Diagnosis not present

## 2019-08-19 DIAGNOSIS — N186 End stage renal disease: Secondary | ICD-10-CM | POA: Diagnosis not present

## 2019-08-19 DIAGNOSIS — Z992 Dependence on renal dialysis: Secondary | ICD-10-CM | POA: Diagnosis not present

## 2019-08-19 DIAGNOSIS — N2581 Secondary hyperparathyroidism of renal origin: Secondary | ICD-10-CM | POA: Diagnosis not present

## 2019-08-19 DIAGNOSIS — D509 Iron deficiency anemia, unspecified: Secondary | ICD-10-CM | POA: Diagnosis not present

## 2019-08-22 DIAGNOSIS — Z992 Dependence on renal dialysis: Secondary | ICD-10-CM | POA: Diagnosis not present

## 2019-08-22 DIAGNOSIS — D631 Anemia in chronic kidney disease: Secondary | ICD-10-CM | POA: Diagnosis not present

## 2019-08-22 DIAGNOSIS — D509 Iron deficiency anemia, unspecified: Secondary | ICD-10-CM | POA: Diagnosis not present

## 2019-08-22 DIAGNOSIS — N2581 Secondary hyperparathyroidism of renal origin: Secondary | ICD-10-CM | POA: Diagnosis not present

## 2019-08-22 DIAGNOSIS — N186 End stage renal disease: Secondary | ICD-10-CM | POA: Diagnosis not present

## 2019-08-24 DIAGNOSIS — Z992 Dependence on renal dialysis: Secondary | ICD-10-CM | POA: Diagnosis not present

## 2019-08-24 DIAGNOSIS — N2581 Secondary hyperparathyroidism of renal origin: Secondary | ICD-10-CM | POA: Diagnosis not present

## 2019-08-24 DIAGNOSIS — D631 Anemia in chronic kidney disease: Secondary | ICD-10-CM | POA: Diagnosis not present

## 2019-08-24 DIAGNOSIS — N186 End stage renal disease: Secondary | ICD-10-CM | POA: Diagnosis not present

## 2019-08-24 DIAGNOSIS — D509 Iron deficiency anemia, unspecified: Secondary | ICD-10-CM | POA: Diagnosis not present

## 2019-08-26 DIAGNOSIS — D631 Anemia in chronic kidney disease: Secondary | ICD-10-CM | POA: Diagnosis not present

## 2019-08-26 DIAGNOSIS — Z992 Dependence on renal dialysis: Secondary | ICD-10-CM | POA: Diagnosis not present

## 2019-08-26 DIAGNOSIS — N186 End stage renal disease: Secondary | ICD-10-CM | POA: Diagnosis not present

## 2019-08-26 DIAGNOSIS — D509 Iron deficiency anemia, unspecified: Secondary | ICD-10-CM | POA: Diagnosis not present

## 2019-08-26 DIAGNOSIS — N2581 Secondary hyperparathyroidism of renal origin: Secondary | ICD-10-CM | POA: Diagnosis not present

## 2019-08-29 DIAGNOSIS — Z992 Dependence on renal dialysis: Secondary | ICD-10-CM | POA: Diagnosis not present

## 2019-08-29 DIAGNOSIS — D631 Anemia in chronic kidney disease: Secondary | ICD-10-CM | POA: Diagnosis not present

## 2019-08-29 DIAGNOSIS — D509 Iron deficiency anemia, unspecified: Secondary | ICD-10-CM | POA: Diagnosis not present

## 2019-08-29 DIAGNOSIS — N2581 Secondary hyperparathyroidism of renal origin: Secondary | ICD-10-CM | POA: Diagnosis not present

## 2019-08-29 DIAGNOSIS — N186 End stage renal disease: Secondary | ICD-10-CM | POA: Diagnosis not present

## 2019-08-31 DIAGNOSIS — D631 Anemia in chronic kidney disease: Secondary | ICD-10-CM | POA: Diagnosis not present

## 2019-08-31 DIAGNOSIS — N2581 Secondary hyperparathyroidism of renal origin: Secondary | ICD-10-CM | POA: Diagnosis not present

## 2019-08-31 DIAGNOSIS — D509 Iron deficiency anemia, unspecified: Secondary | ICD-10-CM | POA: Diagnosis not present

## 2019-08-31 DIAGNOSIS — Z992 Dependence on renal dialysis: Secondary | ICD-10-CM | POA: Diagnosis not present

## 2019-08-31 DIAGNOSIS — N186 End stage renal disease: Secondary | ICD-10-CM | POA: Diagnosis not present

## 2019-09-02 DIAGNOSIS — N2581 Secondary hyperparathyroidism of renal origin: Secondary | ICD-10-CM | POA: Diagnosis not present

## 2019-09-02 DIAGNOSIS — D631 Anemia in chronic kidney disease: Secondary | ICD-10-CM | POA: Diagnosis not present

## 2019-09-02 DIAGNOSIS — N186 End stage renal disease: Secondary | ICD-10-CM | POA: Diagnosis not present

## 2019-09-02 DIAGNOSIS — Z992 Dependence on renal dialysis: Secondary | ICD-10-CM | POA: Diagnosis not present

## 2019-09-02 DIAGNOSIS — D509 Iron deficiency anemia, unspecified: Secondary | ICD-10-CM | POA: Diagnosis not present

## 2019-09-05 DIAGNOSIS — D509 Iron deficiency anemia, unspecified: Secondary | ICD-10-CM | POA: Diagnosis not present

## 2019-09-05 DIAGNOSIS — Z992 Dependence on renal dialysis: Secondary | ICD-10-CM | POA: Diagnosis not present

## 2019-09-05 DIAGNOSIS — N2581 Secondary hyperparathyroidism of renal origin: Secondary | ICD-10-CM | POA: Diagnosis not present

## 2019-09-05 DIAGNOSIS — N186 End stage renal disease: Secondary | ICD-10-CM | POA: Diagnosis not present

## 2019-09-05 DIAGNOSIS — D631 Anemia in chronic kidney disease: Secondary | ICD-10-CM | POA: Diagnosis not present

## 2019-09-07 DIAGNOSIS — N2581 Secondary hyperparathyroidism of renal origin: Secondary | ICD-10-CM | POA: Diagnosis not present

## 2019-09-07 DIAGNOSIS — D631 Anemia in chronic kidney disease: Secondary | ICD-10-CM | POA: Diagnosis not present

## 2019-09-07 DIAGNOSIS — Z992 Dependence on renal dialysis: Secondary | ICD-10-CM | POA: Diagnosis not present

## 2019-09-07 DIAGNOSIS — D509 Iron deficiency anemia, unspecified: Secondary | ICD-10-CM | POA: Diagnosis not present

## 2019-09-07 DIAGNOSIS — N186 End stage renal disease: Secondary | ICD-10-CM | POA: Diagnosis not present

## 2019-09-08 DIAGNOSIS — Z299 Encounter for prophylactic measures, unspecified: Secondary | ICD-10-CM | POA: Diagnosis not present

## 2019-09-08 DIAGNOSIS — I770 Arteriovenous fistula, acquired: Secondary | ICD-10-CM | POA: Diagnosis not present

## 2019-09-08 DIAGNOSIS — N185 Chronic kidney disease, stage 5: Secondary | ICD-10-CM | POA: Diagnosis not present

## 2019-09-08 DIAGNOSIS — M549 Dorsalgia, unspecified: Secondary | ICD-10-CM | POA: Diagnosis not present

## 2019-09-08 DIAGNOSIS — Z992 Dependence on renal dialysis: Secondary | ICD-10-CM | POA: Diagnosis not present

## 2019-09-09 DIAGNOSIS — D631 Anemia in chronic kidney disease: Secondary | ICD-10-CM | POA: Diagnosis not present

## 2019-09-09 DIAGNOSIS — D509 Iron deficiency anemia, unspecified: Secondary | ICD-10-CM | POA: Diagnosis not present

## 2019-09-09 DIAGNOSIS — N2581 Secondary hyperparathyroidism of renal origin: Secondary | ICD-10-CM | POA: Diagnosis not present

## 2019-09-09 DIAGNOSIS — N186 End stage renal disease: Secondary | ICD-10-CM | POA: Diagnosis not present

## 2019-09-09 DIAGNOSIS — Z992 Dependence on renal dialysis: Secondary | ICD-10-CM | POA: Diagnosis not present

## 2019-09-12 DIAGNOSIS — D631 Anemia in chronic kidney disease: Secondary | ICD-10-CM | POA: Diagnosis not present

## 2019-09-12 DIAGNOSIS — N186 End stage renal disease: Secondary | ICD-10-CM | POA: Diagnosis not present

## 2019-09-12 DIAGNOSIS — Z992 Dependence on renal dialysis: Secondary | ICD-10-CM | POA: Diagnosis not present

## 2019-09-12 DIAGNOSIS — D509 Iron deficiency anemia, unspecified: Secondary | ICD-10-CM | POA: Diagnosis not present

## 2019-09-12 DIAGNOSIS — N2581 Secondary hyperparathyroidism of renal origin: Secondary | ICD-10-CM | POA: Diagnosis not present

## 2019-09-14 ENCOUNTER — Other Ambulatory Visit: Payer: Self-pay | Admitting: Cardiology

## 2019-09-14 DIAGNOSIS — D631 Anemia in chronic kidney disease: Secondary | ICD-10-CM | POA: Diagnosis not present

## 2019-09-14 DIAGNOSIS — D509 Iron deficiency anemia, unspecified: Secondary | ICD-10-CM | POA: Diagnosis not present

## 2019-09-14 DIAGNOSIS — N2581 Secondary hyperparathyroidism of renal origin: Secondary | ICD-10-CM | POA: Diagnosis not present

## 2019-09-14 DIAGNOSIS — N186 End stage renal disease: Secondary | ICD-10-CM | POA: Diagnosis not present

## 2019-09-14 DIAGNOSIS — Z992 Dependence on renal dialysis: Secondary | ICD-10-CM | POA: Diagnosis not present

## 2019-09-16 DIAGNOSIS — N2581 Secondary hyperparathyroidism of renal origin: Secondary | ICD-10-CM | POA: Diagnosis not present

## 2019-09-16 DIAGNOSIS — D631 Anemia in chronic kidney disease: Secondary | ICD-10-CM | POA: Diagnosis not present

## 2019-09-16 DIAGNOSIS — N186 End stage renal disease: Secondary | ICD-10-CM | POA: Diagnosis not present

## 2019-09-16 DIAGNOSIS — Z992 Dependence on renal dialysis: Secondary | ICD-10-CM | POA: Diagnosis not present

## 2019-09-19 DIAGNOSIS — D509 Iron deficiency anemia, unspecified: Secondary | ICD-10-CM | POA: Diagnosis not present

## 2019-09-19 DIAGNOSIS — N186 End stage renal disease: Secondary | ICD-10-CM | POA: Diagnosis not present

## 2019-09-19 DIAGNOSIS — Z992 Dependence on renal dialysis: Secondary | ICD-10-CM | POA: Diagnosis not present

## 2019-09-19 DIAGNOSIS — N2581 Secondary hyperparathyroidism of renal origin: Secondary | ICD-10-CM | POA: Diagnosis not present

## 2019-09-19 DIAGNOSIS — D631 Anemia in chronic kidney disease: Secondary | ICD-10-CM | POA: Diagnosis not present

## 2019-09-21 DIAGNOSIS — D509 Iron deficiency anemia, unspecified: Secondary | ICD-10-CM | POA: Diagnosis not present

## 2019-09-21 DIAGNOSIS — N186 End stage renal disease: Secondary | ICD-10-CM | POA: Diagnosis not present

## 2019-09-21 DIAGNOSIS — N2581 Secondary hyperparathyroidism of renal origin: Secondary | ICD-10-CM | POA: Diagnosis not present

## 2019-09-21 DIAGNOSIS — D631 Anemia in chronic kidney disease: Secondary | ICD-10-CM | POA: Diagnosis not present

## 2019-09-21 DIAGNOSIS — Z992 Dependence on renal dialysis: Secondary | ICD-10-CM | POA: Diagnosis not present

## 2019-09-23 DIAGNOSIS — Z992 Dependence on renal dialysis: Secondary | ICD-10-CM | POA: Diagnosis not present

## 2019-09-23 DIAGNOSIS — D631 Anemia in chronic kidney disease: Secondary | ICD-10-CM | POA: Diagnosis not present

## 2019-09-23 DIAGNOSIS — D509 Iron deficiency anemia, unspecified: Secondary | ICD-10-CM | POA: Diagnosis not present

## 2019-09-23 DIAGNOSIS — N186 End stage renal disease: Secondary | ICD-10-CM | POA: Diagnosis not present

## 2019-09-23 DIAGNOSIS — N2581 Secondary hyperparathyroidism of renal origin: Secondary | ICD-10-CM | POA: Diagnosis not present

## 2019-09-26 DIAGNOSIS — N2581 Secondary hyperparathyroidism of renal origin: Secondary | ICD-10-CM | POA: Diagnosis not present

## 2019-09-26 DIAGNOSIS — D509 Iron deficiency anemia, unspecified: Secondary | ICD-10-CM | POA: Diagnosis not present

## 2019-09-26 DIAGNOSIS — Z992 Dependence on renal dialysis: Secondary | ICD-10-CM | POA: Diagnosis not present

## 2019-09-26 DIAGNOSIS — N186 End stage renal disease: Secondary | ICD-10-CM | POA: Diagnosis not present

## 2019-09-26 DIAGNOSIS — D631 Anemia in chronic kidney disease: Secondary | ICD-10-CM | POA: Diagnosis not present

## 2019-09-28 DIAGNOSIS — N2581 Secondary hyperparathyroidism of renal origin: Secondary | ICD-10-CM | POA: Diagnosis not present

## 2019-09-28 DIAGNOSIS — D631 Anemia in chronic kidney disease: Secondary | ICD-10-CM | POA: Diagnosis not present

## 2019-09-28 DIAGNOSIS — Z992 Dependence on renal dialysis: Secondary | ICD-10-CM | POA: Diagnosis not present

## 2019-09-28 DIAGNOSIS — N186 End stage renal disease: Secondary | ICD-10-CM | POA: Diagnosis not present

## 2019-09-28 DIAGNOSIS — D509 Iron deficiency anemia, unspecified: Secondary | ICD-10-CM | POA: Diagnosis not present

## 2019-09-30 DIAGNOSIS — D631 Anemia in chronic kidney disease: Secondary | ICD-10-CM | POA: Diagnosis not present

## 2019-09-30 DIAGNOSIS — Z992 Dependence on renal dialysis: Secondary | ICD-10-CM | POA: Diagnosis not present

## 2019-09-30 DIAGNOSIS — N2581 Secondary hyperparathyroidism of renal origin: Secondary | ICD-10-CM | POA: Diagnosis not present

## 2019-09-30 DIAGNOSIS — D509 Iron deficiency anemia, unspecified: Secondary | ICD-10-CM | POA: Diagnosis not present

## 2019-09-30 DIAGNOSIS — N186 End stage renal disease: Secondary | ICD-10-CM | POA: Diagnosis not present

## 2019-10-03 DIAGNOSIS — D509 Iron deficiency anemia, unspecified: Secondary | ICD-10-CM | POA: Diagnosis not present

## 2019-10-03 DIAGNOSIS — D631 Anemia in chronic kidney disease: Secondary | ICD-10-CM | POA: Diagnosis not present

## 2019-10-03 DIAGNOSIS — Z992 Dependence on renal dialysis: Secondary | ICD-10-CM | POA: Diagnosis not present

## 2019-10-03 DIAGNOSIS — N2581 Secondary hyperparathyroidism of renal origin: Secondary | ICD-10-CM | POA: Diagnosis not present

## 2019-10-03 DIAGNOSIS — N186 End stage renal disease: Secondary | ICD-10-CM | POA: Diagnosis not present

## 2019-10-05 DIAGNOSIS — N2581 Secondary hyperparathyroidism of renal origin: Secondary | ICD-10-CM | POA: Diagnosis not present

## 2019-10-05 DIAGNOSIS — Z992 Dependence on renal dialysis: Secondary | ICD-10-CM | POA: Diagnosis not present

## 2019-10-05 DIAGNOSIS — N186 End stage renal disease: Secondary | ICD-10-CM | POA: Diagnosis not present

## 2019-10-05 DIAGNOSIS — D509 Iron deficiency anemia, unspecified: Secondary | ICD-10-CM | POA: Diagnosis not present

## 2019-10-05 DIAGNOSIS — D631 Anemia in chronic kidney disease: Secondary | ICD-10-CM | POA: Diagnosis not present

## 2019-10-07 DIAGNOSIS — N2581 Secondary hyperparathyroidism of renal origin: Secondary | ICD-10-CM | POA: Diagnosis not present

## 2019-10-07 DIAGNOSIS — D509 Iron deficiency anemia, unspecified: Secondary | ICD-10-CM | POA: Diagnosis not present

## 2019-10-07 DIAGNOSIS — N186 End stage renal disease: Secondary | ICD-10-CM | POA: Diagnosis not present

## 2019-10-07 DIAGNOSIS — Z992 Dependence on renal dialysis: Secondary | ICD-10-CM | POA: Diagnosis not present

## 2019-10-07 DIAGNOSIS — D631 Anemia in chronic kidney disease: Secondary | ICD-10-CM | POA: Diagnosis not present

## 2019-10-10 DIAGNOSIS — D631 Anemia in chronic kidney disease: Secondary | ICD-10-CM | POA: Diagnosis not present

## 2019-10-10 DIAGNOSIS — N186 End stage renal disease: Secondary | ICD-10-CM | POA: Diagnosis not present

## 2019-10-10 DIAGNOSIS — D509 Iron deficiency anemia, unspecified: Secondary | ICD-10-CM | POA: Diagnosis not present

## 2019-10-10 DIAGNOSIS — N2581 Secondary hyperparathyroidism of renal origin: Secondary | ICD-10-CM | POA: Diagnosis not present

## 2019-10-10 DIAGNOSIS — Z992 Dependence on renal dialysis: Secondary | ICD-10-CM | POA: Diagnosis not present

## 2019-10-12 DIAGNOSIS — N186 End stage renal disease: Secondary | ICD-10-CM | POA: Diagnosis not present

## 2019-10-12 DIAGNOSIS — D631 Anemia in chronic kidney disease: Secondary | ICD-10-CM | POA: Diagnosis not present

## 2019-10-12 DIAGNOSIS — N2581 Secondary hyperparathyroidism of renal origin: Secondary | ICD-10-CM | POA: Diagnosis not present

## 2019-10-12 DIAGNOSIS — Z992 Dependence on renal dialysis: Secondary | ICD-10-CM | POA: Diagnosis not present

## 2019-10-12 DIAGNOSIS — D509 Iron deficiency anemia, unspecified: Secondary | ICD-10-CM | POA: Diagnosis not present

## 2019-10-14 DIAGNOSIS — Z992 Dependence on renal dialysis: Secondary | ICD-10-CM | POA: Diagnosis not present

## 2019-10-14 DIAGNOSIS — N2581 Secondary hyperparathyroidism of renal origin: Secondary | ICD-10-CM | POA: Diagnosis not present

## 2019-10-14 DIAGNOSIS — D631 Anemia in chronic kidney disease: Secondary | ICD-10-CM | POA: Diagnosis not present

## 2019-10-14 DIAGNOSIS — N186 End stage renal disease: Secondary | ICD-10-CM | POA: Diagnosis not present

## 2019-10-14 DIAGNOSIS — D509 Iron deficiency anemia, unspecified: Secondary | ICD-10-CM | POA: Diagnosis not present

## 2019-10-16 DIAGNOSIS — Z0000 Encounter for general adult medical examination without abnormal findings: Secondary | ICD-10-CM | POA: Diagnosis not present

## 2019-10-16 DIAGNOSIS — E039 Hypothyroidism, unspecified: Secondary | ICD-10-CM | POA: Diagnosis not present

## 2019-10-16 DIAGNOSIS — Z79899 Other long term (current) drug therapy: Secondary | ICD-10-CM | POA: Diagnosis not present

## 2019-10-16 DIAGNOSIS — Z7189 Other specified counseling: Secondary | ICD-10-CM | POA: Diagnosis not present

## 2019-10-16 DIAGNOSIS — Z6825 Body mass index (BMI) 25.0-25.9, adult: Secondary | ICD-10-CM | POA: Diagnosis not present

## 2019-10-16 DIAGNOSIS — I10 Essential (primary) hypertension: Secondary | ICD-10-CM | POA: Diagnosis not present

## 2019-10-16 DIAGNOSIS — Z1339 Encounter for screening examination for other mental health and behavioral disorders: Secondary | ICD-10-CM | POA: Diagnosis not present

## 2019-10-16 DIAGNOSIS — Z1331 Encounter for screening for depression: Secondary | ICD-10-CM | POA: Diagnosis not present

## 2019-10-16 DIAGNOSIS — E7800 Pure hypercholesterolemia, unspecified: Secondary | ICD-10-CM | POA: Diagnosis not present

## 2019-10-16 DIAGNOSIS — R5383 Other fatigue: Secondary | ICD-10-CM | POA: Diagnosis not present

## 2019-10-16 DIAGNOSIS — Z299 Encounter for prophylactic measures, unspecified: Secondary | ICD-10-CM | POA: Diagnosis not present

## 2019-10-16 DIAGNOSIS — Z125 Encounter for screening for malignant neoplasm of prostate: Secondary | ICD-10-CM | POA: Diagnosis not present

## 2019-10-17 DIAGNOSIS — Z992 Dependence on renal dialysis: Secondary | ICD-10-CM | POA: Diagnosis not present

## 2019-10-17 DIAGNOSIS — D509 Iron deficiency anemia, unspecified: Secondary | ICD-10-CM | POA: Diagnosis not present

## 2019-10-17 DIAGNOSIS — N186 End stage renal disease: Secondary | ICD-10-CM | POA: Diagnosis not present

## 2019-10-17 DIAGNOSIS — N2581 Secondary hyperparathyroidism of renal origin: Secondary | ICD-10-CM | POA: Diagnosis not present

## 2019-10-17 DIAGNOSIS — D631 Anemia in chronic kidney disease: Secondary | ICD-10-CM | POA: Diagnosis not present

## 2019-10-19 DIAGNOSIS — K219 Gastro-esophageal reflux disease without esophagitis: Secondary | ICD-10-CM | POA: Diagnosis not present

## 2019-10-19 DIAGNOSIS — Z992 Dependence on renal dialysis: Secondary | ICD-10-CM | POA: Diagnosis not present

## 2019-10-19 DIAGNOSIS — N2581 Secondary hyperparathyroidism of renal origin: Secondary | ICD-10-CM | POA: Diagnosis not present

## 2019-10-19 DIAGNOSIS — N186 End stage renal disease: Secondary | ICD-10-CM | POA: Diagnosis not present

## 2019-10-19 DIAGNOSIS — E785 Hyperlipidemia, unspecified: Secondary | ICD-10-CM | POA: Diagnosis not present

## 2019-10-19 DIAGNOSIS — D631 Anemia in chronic kidney disease: Secondary | ICD-10-CM | POA: Diagnosis not present

## 2019-10-19 DIAGNOSIS — D509 Iron deficiency anemia, unspecified: Secondary | ICD-10-CM | POA: Diagnosis not present

## 2019-10-19 DIAGNOSIS — I129 Hypertensive chronic kidney disease with stage 1 through stage 4 chronic kidney disease, or unspecified chronic kidney disease: Secondary | ICD-10-CM | POA: Diagnosis not present

## 2019-10-19 DIAGNOSIS — N185 Chronic kidney disease, stage 5: Secondary | ICD-10-CM | POA: Diagnosis not present

## 2019-10-21 DIAGNOSIS — N186 End stage renal disease: Secondary | ICD-10-CM | POA: Diagnosis not present

## 2019-10-21 DIAGNOSIS — D631 Anemia in chronic kidney disease: Secondary | ICD-10-CM | POA: Diagnosis not present

## 2019-10-21 DIAGNOSIS — D509 Iron deficiency anemia, unspecified: Secondary | ICD-10-CM | POA: Diagnosis not present

## 2019-10-21 DIAGNOSIS — Z23 Encounter for immunization: Secondary | ICD-10-CM | POA: Diagnosis not present

## 2019-10-21 DIAGNOSIS — N2581 Secondary hyperparathyroidism of renal origin: Secondary | ICD-10-CM | POA: Diagnosis not present

## 2019-10-21 DIAGNOSIS — Z992 Dependence on renal dialysis: Secondary | ICD-10-CM | POA: Diagnosis not present

## 2019-10-24 DIAGNOSIS — Z23 Encounter for immunization: Secondary | ICD-10-CM | POA: Diagnosis not present

## 2019-10-24 DIAGNOSIS — D631 Anemia in chronic kidney disease: Secondary | ICD-10-CM | POA: Diagnosis not present

## 2019-10-24 DIAGNOSIS — N186 End stage renal disease: Secondary | ICD-10-CM | POA: Diagnosis not present

## 2019-10-24 DIAGNOSIS — D509 Iron deficiency anemia, unspecified: Secondary | ICD-10-CM | POA: Diagnosis not present

## 2019-10-24 DIAGNOSIS — N2581 Secondary hyperparathyroidism of renal origin: Secondary | ICD-10-CM | POA: Diagnosis not present

## 2019-10-24 DIAGNOSIS — Z992 Dependence on renal dialysis: Secondary | ICD-10-CM | POA: Diagnosis not present

## 2019-10-26 DIAGNOSIS — N2581 Secondary hyperparathyroidism of renal origin: Secondary | ICD-10-CM | POA: Diagnosis not present

## 2019-10-26 DIAGNOSIS — N186 End stage renal disease: Secondary | ICD-10-CM | POA: Diagnosis not present

## 2019-10-26 DIAGNOSIS — Z23 Encounter for immunization: Secondary | ICD-10-CM | POA: Diagnosis not present

## 2019-10-26 DIAGNOSIS — D631 Anemia in chronic kidney disease: Secondary | ICD-10-CM | POA: Diagnosis not present

## 2019-10-26 DIAGNOSIS — D509 Iron deficiency anemia, unspecified: Secondary | ICD-10-CM | POA: Diagnosis not present

## 2019-10-26 DIAGNOSIS — Z992 Dependence on renal dialysis: Secondary | ICD-10-CM | POA: Diagnosis not present

## 2019-10-28 DIAGNOSIS — D509 Iron deficiency anemia, unspecified: Secondary | ICD-10-CM | POA: Diagnosis not present

## 2019-10-28 DIAGNOSIS — N2581 Secondary hyperparathyroidism of renal origin: Secondary | ICD-10-CM | POA: Diagnosis not present

## 2019-10-28 DIAGNOSIS — Z992 Dependence on renal dialysis: Secondary | ICD-10-CM | POA: Diagnosis not present

## 2019-10-28 DIAGNOSIS — Z23 Encounter for immunization: Secondary | ICD-10-CM | POA: Diagnosis not present

## 2019-10-28 DIAGNOSIS — D631 Anemia in chronic kidney disease: Secondary | ICD-10-CM | POA: Diagnosis not present

## 2019-10-28 DIAGNOSIS — N186 End stage renal disease: Secondary | ICD-10-CM | POA: Diagnosis not present

## 2019-10-31 DIAGNOSIS — Z992 Dependence on renal dialysis: Secondary | ICD-10-CM | POA: Diagnosis not present

## 2019-10-31 DIAGNOSIS — Z23 Encounter for immunization: Secondary | ICD-10-CM | POA: Diagnosis not present

## 2019-10-31 DIAGNOSIS — N186 End stage renal disease: Secondary | ICD-10-CM | POA: Diagnosis not present

## 2019-10-31 DIAGNOSIS — N2581 Secondary hyperparathyroidism of renal origin: Secondary | ICD-10-CM | POA: Diagnosis not present

## 2019-10-31 DIAGNOSIS — D631 Anemia in chronic kidney disease: Secondary | ICD-10-CM | POA: Diagnosis not present

## 2019-10-31 DIAGNOSIS — D509 Iron deficiency anemia, unspecified: Secondary | ICD-10-CM | POA: Diagnosis not present

## 2019-11-02 DIAGNOSIS — Z992 Dependence on renal dialysis: Secondary | ICD-10-CM | POA: Diagnosis not present

## 2019-11-02 DIAGNOSIS — Z23 Encounter for immunization: Secondary | ICD-10-CM | POA: Diagnosis not present

## 2019-11-02 DIAGNOSIS — D509 Iron deficiency anemia, unspecified: Secondary | ICD-10-CM | POA: Diagnosis not present

## 2019-11-02 DIAGNOSIS — D631 Anemia in chronic kidney disease: Secondary | ICD-10-CM | POA: Diagnosis not present

## 2019-11-02 DIAGNOSIS — N186 End stage renal disease: Secondary | ICD-10-CM | POA: Diagnosis not present

## 2019-11-02 DIAGNOSIS — N2581 Secondary hyperparathyroidism of renal origin: Secondary | ICD-10-CM | POA: Diagnosis not present

## 2019-11-03 ENCOUNTER — Ambulatory Visit (INDEPENDENT_AMBULATORY_CARE_PROVIDER_SITE_OTHER): Payer: Medicare Other

## 2019-11-03 DIAGNOSIS — I480 Paroxysmal atrial fibrillation: Secondary | ICD-10-CM | POA: Diagnosis not present

## 2019-11-04 DIAGNOSIS — N186 End stage renal disease: Secondary | ICD-10-CM | POA: Diagnosis not present

## 2019-11-04 DIAGNOSIS — Z992 Dependence on renal dialysis: Secondary | ICD-10-CM | POA: Diagnosis not present

## 2019-11-04 DIAGNOSIS — D631 Anemia in chronic kidney disease: Secondary | ICD-10-CM | POA: Diagnosis not present

## 2019-11-04 DIAGNOSIS — Z23 Encounter for immunization: Secondary | ICD-10-CM | POA: Diagnosis not present

## 2019-11-04 DIAGNOSIS — N2581 Secondary hyperparathyroidism of renal origin: Secondary | ICD-10-CM | POA: Diagnosis not present

## 2019-11-04 DIAGNOSIS — D509 Iron deficiency anemia, unspecified: Secondary | ICD-10-CM | POA: Diagnosis not present

## 2019-11-07 DIAGNOSIS — Z992 Dependence on renal dialysis: Secondary | ICD-10-CM | POA: Diagnosis not present

## 2019-11-07 DIAGNOSIS — Z23 Encounter for immunization: Secondary | ICD-10-CM | POA: Diagnosis not present

## 2019-11-07 DIAGNOSIS — D509 Iron deficiency anemia, unspecified: Secondary | ICD-10-CM | POA: Diagnosis not present

## 2019-11-07 DIAGNOSIS — N186 End stage renal disease: Secondary | ICD-10-CM | POA: Diagnosis not present

## 2019-11-07 DIAGNOSIS — Z20822 Contact with and (suspected) exposure to covid-19: Secondary | ICD-10-CM | POA: Diagnosis not present

## 2019-11-07 DIAGNOSIS — N2581 Secondary hyperparathyroidism of renal origin: Secondary | ICD-10-CM | POA: Diagnosis not present

## 2019-11-07 DIAGNOSIS — D631 Anemia in chronic kidney disease: Secondary | ICD-10-CM | POA: Diagnosis not present

## 2019-11-07 LAB — CUP PACEART REMOTE DEVICE CHECK
Battery Impedance: 947 Ohm
Battery Remaining Longevity: 62 mo
Battery Voltage: 2.78 V
Brady Statistic AP VP Percent: 2 %
Brady Statistic AP VS Percent: 0 %
Brady Statistic AS VP Percent: 1 %
Brady Statistic AS VS Percent: 97 %
Date Time Interrogation Session: 20211018200703
Implantable Lead Implant Date: 20080604
Implantable Lead Implant Date: 20080604
Implantable Lead Location: 753859
Implantable Lead Location: 753860
Implantable Lead Model: 4469
Implantable Lead Model: 4470
Implantable Lead Serial Number: 498205
Implantable Lead Serial Number: 601713
Implantable Pulse Generator Implant Date: 20130212
Lead Channel Impedance Value: 435 Ohm
Lead Channel Impedance Value: 485 Ohm
Lead Channel Pacing Threshold Amplitude: 0.625 V
Lead Channel Pacing Threshold Amplitude: 0.625 V
Lead Channel Pacing Threshold Pulse Width: 0.4 ms
Lead Channel Pacing Threshold Pulse Width: 0.4 ms
Lead Channel Setting Pacing Amplitude: 2 V
Lead Channel Setting Pacing Amplitude: 2.5 V
Lead Channel Setting Pacing Pulse Width: 0.4 ms
Lead Channel Setting Sensing Sensitivity: 2.8 mV

## 2019-11-08 NOTE — Progress Notes (Signed)
Remote pacemaker transmission.   

## 2019-11-09 DIAGNOSIS — Z992 Dependence on renal dialysis: Secondary | ICD-10-CM | POA: Diagnosis not present

## 2019-11-09 DIAGNOSIS — D631 Anemia in chronic kidney disease: Secondary | ICD-10-CM | POA: Diagnosis not present

## 2019-11-09 DIAGNOSIS — Z23 Encounter for immunization: Secondary | ICD-10-CM | POA: Diagnosis not present

## 2019-11-09 DIAGNOSIS — D509 Iron deficiency anemia, unspecified: Secondary | ICD-10-CM | POA: Diagnosis not present

## 2019-11-09 DIAGNOSIS — N186 End stage renal disease: Secondary | ICD-10-CM | POA: Diagnosis not present

## 2019-11-09 DIAGNOSIS — N2581 Secondary hyperparathyroidism of renal origin: Secondary | ICD-10-CM | POA: Diagnosis not present

## 2019-11-11 DIAGNOSIS — D509 Iron deficiency anemia, unspecified: Secondary | ICD-10-CM | POA: Diagnosis not present

## 2019-11-11 DIAGNOSIS — D631 Anemia in chronic kidney disease: Secondary | ICD-10-CM | POA: Diagnosis not present

## 2019-11-11 DIAGNOSIS — N186 End stage renal disease: Secondary | ICD-10-CM | POA: Diagnosis not present

## 2019-11-11 DIAGNOSIS — Z992 Dependence on renal dialysis: Secondary | ICD-10-CM | POA: Diagnosis not present

## 2019-11-11 DIAGNOSIS — N2581 Secondary hyperparathyroidism of renal origin: Secondary | ICD-10-CM | POA: Diagnosis not present

## 2019-11-11 DIAGNOSIS — Z23 Encounter for immunization: Secondary | ICD-10-CM | POA: Diagnosis not present

## 2019-11-14 DIAGNOSIS — N186 End stage renal disease: Secondary | ICD-10-CM | POA: Diagnosis not present

## 2019-11-14 DIAGNOSIS — Z992 Dependence on renal dialysis: Secondary | ICD-10-CM | POA: Diagnosis not present

## 2019-11-14 DIAGNOSIS — N2581 Secondary hyperparathyroidism of renal origin: Secondary | ICD-10-CM | POA: Diagnosis not present

## 2019-11-15 DIAGNOSIS — M170 Bilateral primary osteoarthritis of knee: Secondary | ICD-10-CM | POA: Diagnosis not present

## 2019-11-15 DIAGNOSIS — N185 Chronic kidney disease, stage 5: Secondary | ICD-10-CM | POA: Diagnosis not present

## 2019-11-15 DIAGNOSIS — Z299 Encounter for prophylactic measures, unspecified: Secondary | ICD-10-CM | POA: Diagnosis not present

## 2019-11-15 DIAGNOSIS — M549 Dorsalgia, unspecified: Secondary | ICD-10-CM | POA: Diagnosis not present

## 2019-11-15 DIAGNOSIS — M2550 Pain in unspecified joint: Secondary | ICD-10-CM | POA: Diagnosis not present

## 2019-11-15 DIAGNOSIS — Z79899 Other long term (current) drug therapy: Secondary | ICD-10-CM | POA: Diagnosis not present

## 2019-11-16 DIAGNOSIS — N186 End stage renal disease: Secondary | ICD-10-CM | POA: Diagnosis not present

## 2019-11-16 DIAGNOSIS — Z992 Dependence on renal dialysis: Secondary | ICD-10-CM | POA: Diagnosis not present

## 2019-11-16 DIAGNOSIS — N2581 Secondary hyperparathyroidism of renal origin: Secondary | ICD-10-CM | POA: Diagnosis not present

## 2019-11-17 DIAGNOSIS — N185 Chronic kidney disease, stage 5: Secondary | ICD-10-CM | POA: Diagnosis not present

## 2019-11-17 DIAGNOSIS — K219 Gastro-esophageal reflux disease without esophagitis: Secondary | ICD-10-CM | POA: Diagnosis not present

## 2019-11-17 DIAGNOSIS — E785 Hyperlipidemia, unspecified: Secondary | ICD-10-CM | POA: Diagnosis not present

## 2019-11-17 DIAGNOSIS — I129 Hypertensive chronic kidney disease with stage 1 through stage 4 chronic kidney disease, or unspecified chronic kidney disease: Secondary | ICD-10-CM | POA: Diagnosis not present

## 2019-11-18 DIAGNOSIS — N2581 Secondary hyperparathyroidism of renal origin: Secondary | ICD-10-CM | POA: Diagnosis not present

## 2019-11-18 DIAGNOSIS — N186 End stage renal disease: Secondary | ICD-10-CM | POA: Diagnosis not present

## 2019-11-18 DIAGNOSIS — Z992 Dependence on renal dialysis: Secondary | ICD-10-CM | POA: Diagnosis not present

## 2019-11-19 DIAGNOSIS — Z992 Dependence on renal dialysis: Secondary | ICD-10-CM | POA: Diagnosis not present

## 2019-11-19 DIAGNOSIS — N186 End stage renal disease: Secondary | ICD-10-CM | POA: Diagnosis not present

## 2019-11-21 DIAGNOSIS — Z992 Dependence on renal dialysis: Secondary | ICD-10-CM | POA: Diagnosis not present

## 2019-11-21 DIAGNOSIS — D509 Iron deficiency anemia, unspecified: Secondary | ICD-10-CM | POA: Diagnosis not present

## 2019-11-21 DIAGNOSIS — N2581 Secondary hyperparathyroidism of renal origin: Secondary | ICD-10-CM | POA: Diagnosis not present

## 2019-11-21 DIAGNOSIS — N186 End stage renal disease: Secondary | ICD-10-CM | POA: Diagnosis not present

## 2019-11-21 DIAGNOSIS — D631 Anemia in chronic kidney disease: Secondary | ICD-10-CM | POA: Diagnosis not present

## 2019-11-23 DIAGNOSIS — D509 Iron deficiency anemia, unspecified: Secondary | ICD-10-CM | POA: Diagnosis not present

## 2019-11-23 DIAGNOSIS — Z992 Dependence on renal dialysis: Secondary | ICD-10-CM | POA: Diagnosis not present

## 2019-11-23 DIAGNOSIS — D631 Anemia in chronic kidney disease: Secondary | ICD-10-CM | POA: Diagnosis not present

## 2019-11-23 DIAGNOSIS — N186 End stage renal disease: Secondary | ICD-10-CM | POA: Diagnosis not present

## 2019-11-23 DIAGNOSIS — N2581 Secondary hyperparathyroidism of renal origin: Secondary | ICD-10-CM | POA: Diagnosis not present

## 2019-11-25 DIAGNOSIS — D631 Anemia in chronic kidney disease: Secondary | ICD-10-CM | POA: Diagnosis not present

## 2019-11-25 DIAGNOSIS — N186 End stage renal disease: Secondary | ICD-10-CM | POA: Diagnosis not present

## 2019-11-25 DIAGNOSIS — N2581 Secondary hyperparathyroidism of renal origin: Secondary | ICD-10-CM | POA: Diagnosis not present

## 2019-11-25 DIAGNOSIS — D509 Iron deficiency anemia, unspecified: Secondary | ICD-10-CM | POA: Diagnosis not present

## 2019-11-25 DIAGNOSIS — Z992 Dependence on renal dialysis: Secondary | ICD-10-CM | POA: Diagnosis not present

## 2019-11-28 DIAGNOSIS — N186 End stage renal disease: Secondary | ICD-10-CM | POA: Diagnosis not present

## 2019-11-28 DIAGNOSIS — N2581 Secondary hyperparathyroidism of renal origin: Secondary | ICD-10-CM | POA: Diagnosis not present

## 2019-11-28 DIAGNOSIS — D509 Iron deficiency anemia, unspecified: Secondary | ICD-10-CM | POA: Diagnosis not present

## 2019-11-28 DIAGNOSIS — Z992 Dependence on renal dialysis: Secondary | ICD-10-CM | POA: Diagnosis not present

## 2019-11-28 DIAGNOSIS — D631 Anemia in chronic kidney disease: Secondary | ICD-10-CM | POA: Diagnosis not present

## 2019-11-30 DIAGNOSIS — D509 Iron deficiency anemia, unspecified: Secondary | ICD-10-CM | POA: Diagnosis not present

## 2019-11-30 DIAGNOSIS — N186 End stage renal disease: Secondary | ICD-10-CM | POA: Diagnosis not present

## 2019-11-30 DIAGNOSIS — N2581 Secondary hyperparathyroidism of renal origin: Secondary | ICD-10-CM | POA: Diagnosis not present

## 2019-11-30 DIAGNOSIS — Z992 Dependence on renal dialysis: Secondary | ICD-10-CM | POA: Diagnosis not present

## 2019-11-30 DIAGNOSIS — D631 Anemia in chronic kidney disease: Secondary | ICD-10-CM | POA: Diagnosis not present

## 2019-12-02 DIAGNOSIS — Z992 Dependence on renal dialysis: Secondary | ICD-10-CM | POA: Diagnosis not present

## 2019-12-02 DIAGNOSIS — D509 Iron deficiency anemia, unspecified: Secondary | ICD-10-CM | POA: Diagnosis not present

## 2019-12-02 DIAGNOSIS — D631 Anemia in chronic kidney disease: Secondary | ICD-10-CM | POA: Diagnosis not present

## 2019-12-02 DIAGNOSIS — N186 End stage renal disease: Secondary | ICD-10-CM | POA: Diagnosis not present

## 2019-12-02 DIAGNOSIS — N2581 Secondary hyperparathyroidism of renal origin: Secondary | ICD-10-CM | POA: Diagnosis not present

## 2019-12-05 DIAGNOSIS — D509 Iron deficiency anemia, unspecified: Secondary | ICD-10-CM | POA: Diagnosis not present

## 2019-12-05 DIAGNOSIS — N2581 Secondary hyperparathyroidism of renal origin: Secondary | ICD-10-CM | POA: Diagnosis not present

## 2019-12-05 DIAGNOSIS — N186 End stage renal disease: Secondary | ICD-10-CM | POA: Diagnosis not present

## 2019-12-05 DIAGNOSIS — Z992 Dependence on renal dialysis: Secondary | ICD-10-CM | POA: Diagnosis not present

## 2019-12-05 DIAGNOSIS — D631 Anemia in chronic kidney disease: Secondary | ICD-10-CM | POA: Diagnosis not present

## 2019-12-07 DIAGNOSIS — Z992 Dependence on renal dialysis: Secondary | ICD-10-CM | POA: Diagnosis not present

## 2019-12-07 DIAGNOSIS — D509 Iron deficiency anemia, unspecified: Secondary | ICD-10-CM | POA: Diagnosis not present

## 2019-12-07 DIAGNOSIS — N2581 Secondary hyperparathyroidism of renal origin: Secondary | ICD-10-CM | POA: Diagnosis not present

## 2019-12-07 DIAGNOSIS — D631 Anemia in chronic kidney disease: Secondary | ICD-10-CM | POA: Diagnosis not present

## 2019-12-07 DIAGNOSIS — N186 End stage renal disease: Secondary | ICD-10-CM | POA: Diagnosis not present

## 2019-12-09 DIAGNOSIS — D631 Anemia in chronic kidney disease: Secondary | ICD-10-CM | POA: Diagnosis not present

## 2019-12-09 DIAGNOSIS — Z992 Dependence on renal dialysis: Secondary | ICD-10-CM | POA: Diagnosis not present

## 2019-12-09 DIAGNOSIS — D509 Iron deficiency anemia, unspecified: Secondary | ICD-10-CM | POA: Diagnosis not present

## 2019-12-09 DIAGNOSIS — N186 End stage renal disease: Secondary | ICD-10-CM | POA: Diagnosis not present

## 2019-12-09 DIAGNOSIS — N2581 Secondary hyperparathyroidism of renal origin: Secondary | ICD-10-CM | POA: Diagnosis not present

## 2019-12-12 DIAGNOSIS — D509 Iron deficiency anemia, unspecified: Secondary | ICD-10-CM | POA: Diagnosis not present

## 2019-12-12 DIAGNOSIS — D631 Anemia in chronic kidney disease: Secondary | ICD-10-CM | POA: Diagnosis not present

## 2019-12-12 DIAGNOSIS — N2581 Secondary hyperparathyroidism of renal origin: Secondary | ICD-10-CM | POA: Diagnosis not present

## 2019-12-12 DIAGNOSIS — N186 End stage renal disease: Secondary | ICD-10-CM | POA: Diagnosis not present

## 2019-12-12 DIAGNOSIS — Z992 Dependence on renal dialysis: Secondary | ICD-10-CM | POA: Diagnosis not present

## 2019-12-13 DIAGNOSIS — Z992 Dependence on renal dialysis: Secondary | ICD-10-CM | POA: Diagnosis not present

## 2019-12-13 DIAGNOSIS — M2550 Pain in unspecified joint: Secondary | ICD-10-CM | POA: Diagnosis not present

## 2019-12-13 DIAGNOSIS — N186 End stage renal disease: Secondary | ICD-10-CM | POA: Diagnosis not present

## 2019-12-13 DIAGNOSIS — Z299 Encounter for prophylactic measures, unspecified: Secondary | ICD-10-CM | POA: Diagnosis not present

## 2019-12-13 DIAGNOSIS — Z79899 Other long term (current) drug therapy: Secondary | ICD-10-CM | POA: Diagnosis not present

## 2019-12-14 DIAGNOSIS — N2581 Secondary hyperparathyroidism of renal origin: Secondary | ICD-10-CM | POA: Diagnosis not present

## 2019-12-14 DIAGNOSIS — D631 Anemia in chronic kidney disease: Secondary | ICD-10-CM | POA: Diagnosis not present

## 2019-12-14 DIAGNOSIS — D509 Iron deficiency anemia, unspecified: Secondary | ICD-10-CM | POA: Diagnosis not present

## 2019-12-14 DIAGNOSIS — N186 End stage renal disease: Secondary | ICD-10-CM | POA: Diagnosis not present

## 2019-12-14 DIAGNOSIS — Z992 Dependence on renal dialysis: Secondary | ICD-10-CM | POA: Diagnosis not present

## 2019-12-16 DIAGNOSIS — D631 Anemia in chronic kidney disease: Secondary | ICD-10-CM | POA: Diagnosis not present

## 2019-12-16 DIAGNOSIS — Z992 Dependence on renal dialysis: Secondary | ICD-10-CM | POA: Diagnosis not present

## 2019-12-16 DIAGNOSIS — N186 End stage renal disease: Secondary | ICD-10-CM | POA: Diagnosis not present

## 2019-12-16 DIAGNOSIS — N2581 Secondary hyperparathyroidism of renal origin: Secondary | ICD-10-CM | POA: Diagnosis not present

## 2019-12-16 DIAGNOSIS — D509 Iron deficiency anemia, unspecified: Secondary | ICD-10-CM | POA: Diagnosis not present

## 2019-12-19 DIAGNOSIS — Z992 Dependence on renal dialysis: Secondary | ICD-10-CM | POA: Diagnosis not present

## 2019-12-19 DIAGNOSIS — D631 Anemia in chronic kidney disease: Secondary | ICD-10-CM | POA: Diagnosis not present

## 2019-12-19 DIAGNOSIS — N2581 Secondary hyperparathyroidism of renal origin: Secondary | ICD-10-CM | POA: Diagnosis not present

## 2019-12-19 DIAGNOSIS — N186 End stage renal disease: Secondary | ICD-10-CM | POA: Diagnosis not present

## 2019-12-19 DIAGNOSIS — D509 Iron deficiency anemia, unspecified: Secondary | ICD-10-CM | POA: Diagnosis not present

## 2019-12-21 DIAGNOSIS — D509 Iron deficiency anemia, unspecified: Secondary | ICD-10-CM | POA: Diagnosis not present

## 2019-12-21 DIAGNOSIS — Z992 Dependence on renal dialysis: Secondary | ICD-10-CM | POA: Diagnosis not present

## 2019-12-21 DIAGNOSIS — N2581 Secondary hyperparathyroidism of renal origin: Secondary | ICD-10-CM | POA: Diagnosis not present

## 2019-12-21 DIAGNOSIS — N186 End stage renal disease: Secondary | ICD-10-CM | POA: Diagnosis not present

## 2019-12-21 DIAGNOSIS — D631 Anemia in chronic kidney disease: Secondary | ICD-10-CM | POA: Diagnosis not present

## 2019-12-23 DIAGNOSIS — D631 Anemia in chronic kidney disease: Secondary | ICD-10-CM | POA: Diagnosis not present

## 2019-12-23 DIAGNOSIS — N186 End stage renal disease: Secondary | ICD-10-CM | POA: Diagnosis not present

## 2019-12-23 DIAGNOSIS — N2581 Secondary hyperparathyroidism of renal origin: Secondary | ICD-10-CM | POA: Diagnosis not present

## 2019-12-23 DIAGNOSIS — D509 Iron deficiency anemia, unspecified: Secondary | ICD-10-CM | POA: Diagnosis not present

## 2019-12-23 DIAGNOSIS — Z992 Dependence on renal dialysis: Secondary | ICD-10-CM | POA: Diagnosis not present

## 2019-12-26 DIAGNOSIS — Z992 Dependence on renal dialysis: Secondary | ICD-10-CM | POA: Diagnosis not present

## 2019-12-26 DIAGNOSIS — D631 Anemia in chronic kidney disease: Secondary | ICD-10-CM | POA: Diagnosis not present

## 2019-12-26 DIAGNOSIS — N2581 Secondary hyperparathyroidism of renal origin: Secondary | ICD-10-CM | POA: Diagnosis not present

## 2019-12-26 DIAGNOSIS — D509 Iron deficiency anemia, unspecified: Secondary | ICD-10-CM | POA: Diagnosis not present

## 2019-12-26 DIAGNOSIS — N186 End stage renal disease: Secondary | ICD-10-CM | POA: Diagnosis not present

## 2019-12-28 DIAGNOSIS — Z992 Dependence on renal dialysis: Secondary | ICD-10-CM | POA: Diagnosis not present

## 2019-12-28 DIAGNOSIS — N2581 Secondary hyperparathyroidism of renal origin: Secondary | ICD-10-CM | POA: Diagnosis not present

## 2019-12-28 DIAGNOSIS — D509 Iron deficiency anemia, unspecified: Secondary | ICD-10-CM | POA: Diagnosis not present

## 2019-12-28 DIAGNOSIS — D631 Anemia in chronic kidney disease: Secondary | ICD-10-CM | POA: Diagnosis not present

## 2019-12-28 DIAGNOSIS — N186 End stage renal disease: Secondary | ICD-10-CM | POA: Diagnosis not present

## 2019-12-30 DIAGNOSIS — N186 End stage renal disease: Secondary | ICD-10-CM | POA: Diagnosis not present

## 2019-12-30 DIAGNOSIS — N2581 Secondary hyperparathyroidism of renal origin: Secondary | ICD-10-CM | POA: Diagnosis not present

## 2019-12-30 DIAGNOSIS — Z992 Dependence on renal dialysis: Secondary | ICD-10-CM | POA: Diagnosis not present

## 2019-12-30 DIAGNOSIS — D631 Anemia in chronic kidney disease: Secondary | ICD-10-CM | POA: Diagnosis not present

## 2019-12-30 DIAGNOSIS — D509 Iron deficiency anemia, unspecified: Secondary | ICD-10-CM | POA: Diagnosis not present

## 2020-01-02 DIAGNOSIS — D509 Iron deficiency anemia, unspecified: Secondary | ICD-10-CM | POA: Diagnosis not present

## 2020-01-02 DIAGNOSIS — N2581 Secondary hyperparathyroidism of renal origin: Secondary | ICD-10-CM | POA: Diagnosis not present

## 2020-01-02 DIAGNOSIS — N186 End stage renal disease: Secondary | ICD-10-CM | POA: Diagnosis not present

## 2020-01-02 DIAGNOSIS — D631 Anemia in chronic kidney disease: Secondary | ICD-10-CM | POA: Diagnosis not present

## 2020-01-02 DIAGNOSIS — Z992 Dependence on renal dialysis: Secondary | ICD-10-CM | POA: Diagnosis not present

## 2020-01-04 DIAGNOSIS — N2581 Secondary hyperparathyroidism of renal origin: Secondary | ICD-10-CM | POA: Diagnosis not present

## 2020-01-04 DIAGNOSIS — D509 Iron deficiency anemia, unspecified: Secondary | ICD-10-CM | POA: Diagnosis not present

## 2020-01-04 DIAGNOSIS — D631 Anemia in chronic kidney disease: Secondary | ICD-10-CM | POA: Diagnosis not present

## 2020-01-04 DIAGNOSIS — Z992 Dependence on renal dialysis: Secondary | ICD-10-CM | POA: Diagnosis not present

## 2020-01-04 DIAGNOSIS — N186 End stage renal disease: Secondary | ICD-10-CM | POA: Diagnosis not present

## 2020-01-06 DIAGNOSIS — Z992 Dependence on renal dialysis: Secondary | ICD-10-CM | POA: Diagnosis not present

## 2020-01-06 DIAGNOSIS — D631 Anemia in chronic kidney disease: Secondary | ICD-10-CM | POA: Diagnosis not present

## 2020-01-06 DIAGNOSIS — N2581 Secondary hyperparathyroidism of renal origin: Secondary | ICD-10-CM | POA: Diagnosis not present

## 2020-01-06 DIAGNOSIS — N186 End stage renal disease: Secondary | ICD-10-CM | POA: Diagnosis not present

## 2020-01-06 DIAGNOSIS — D509 Iron deficiency anemia, unspecified: Secondary | ICD-10-CM | POA: Diagnosis not present

## 2020-01-09 DIAGNOSIS — N2581 Secondary hyperparathyroidism of renal origin: Secondary | ICD-10-CM | POA: Diagnosis not present

## 2020-01-09 DIAGNOSIS — D509 Iron deficiency anemia, unspecified: Secondary | ICD-10-CM | POA: Diagnosis not present

## 2020-01-09 DIAGNOSIS — Z992 Dependence on renal dialysis: Secondary | ICD-10-CM | POA: Diagnosis not present

## 2020-01-09 DIAGNOSIS — N186 End stage renal disease: Secondary | ICD-10-CM | POA: Diagnosis not present

## 2020-01-09 DIAGNOSIS — D631 Anemia in chronic kidney disease: Secondary | ICD-10-CM | POA: Diagnosis not present

## 2020-01-10 DIAGNOSIS — M25569 Pain in unspecified knee: Secondary | ICD-10-CM | POA: Diagnosis not present

## 2020-01-10 DIAGNOSIS — Z299 Encounter for prophylactic measures, unspecified: Secondary | ICD-10-CM | POA: Diagnosis not present

## 2020-01-10 DIAGNOSIS — I770 Arteriovenous fistula, acquired: Secondary | ICD-10-CM | POA: Diagnosis not present

## 2020-01-10 DIAGNOSIS — Z79899 Other long term (current) drug therapy: Secondary | ICD-10-CM | POA: Diagnosis not present

## 2020-01-10 DIAGNOSIS — Z992 Dependence on renal dialysis: Secondary | ICD-10-CM | POA: Diagnosis not present

## 2020-01-11 DIAGNOSIS — N2581 Secondary hyperparathyroidism of renal origin: Secondary | ICD-10-CM | POA: Diagnosis not present

## 2020-01-11 DIAGNOSIS — Z992 Dependence on renal dialysis: Secondary | ICD-10-CM | POA: Diagnosis not present

## 2020-01-11 DIAGNOSIS — D631 Anemia in chronic kidney disease: Secondary | ICD-10-CM | POA: Diagnosis not present

## 2020-01-11 DIAGNOSIS — N186 End stage renal disease: Secondary | ICD-10-CM | POA: Diagnosis not present

## 2020-01-11 DIAGNOSIS — D509 Iron deficiency anemia, unspecified: Secondary | ICD-10-CM | POA: Diagnosis not present

## 2020-01-14 DIAGNOSIS — D509 Iron deficiency anemia, unspecified: Secondary | ICD-10-CM | POA: Diagnosis not present

## 2020-01-14 DIAGNOSIS — D631 Anemia in chronic kidney disease: Secondary | ICD-10-CM | POA: Diagnosis not present

## 2020-01-14 DIAGNOSIS — N2581 Secondary hyperparathyroidism of renal origin: Secondary | ICD-10-CM | POA: Diagnosis not present

## 2020-01-14 DIAGNOSIS — Z992 Dependence on renal dialysis: Secondary | ICD-10-CM | POA: Diagnosis not present

## 2020-01-14 DIAGNOSIS — N186 End stage renal disease: Secondary | ICD-10-CM | POA: Diagnosis not present

## 2020-01-16 DIAGNOSIS — D509 Iron deficiency anemia, unspecified: Secondary | ICD-10-CM | POA: Diagnosis not present

## 2020-01-16 DIAGNOSIS — N2581 Secondary hyperparathyroidism of renal origin: Secondary | ICD-10-CM | POA: Diagnosis not present

## 2020-01-16 DIAGNOSIS — Z992 Dependence on renal dialysis: Secondary | ICD-10-CM | POA: Diagnosis not present

## 2020-01-16 DIAGNOSIS — D631 Anemia in chronic kidney disease: Secondary | ICD-10-CM | POA: Diagnosis not present

## 2020-01-16 DIAGNOSIS — N186 End stage renal disease: Secondary | ICD-10-CM | POA: Diagnosis not present

## 2020-01-18 DIAGNOSIS — D509 Iron deficiency anemia, unspecified: Secondary | ICD-10-CM | POA: Diagnosis not present

## 2020-01-18 DIAGNOSIS — N186 End stage renal disease: Secondary | ICD-10-CM | POA: Diagnosis not present

## 2020-01-18 DIAGNOSIS — N2581 Secondary hyperparathyroidism of renal origin: Secondary | ICD-10-CM | POA: Diagnosis not present

## 2020-01-18 DIAGNOSIS — Z992 Dependence on renal dialysis: Secondary | ICD-10-CM | POA: Diagnosis not present

## 2020-01-18 DIAGNOSIS — D631 Anemia in chronic kidney disease: Secondary | ICD-10-CM | POA: Diagnosis not present

## 2020-01-19 DIAGNOSIS — N186 End stage renal disease: Secondary | ICD-10-CM | POA: Diagnosis not present

## 2020-01-19 DIAGNOSIS — Z992 Dependence on renal dialysis: Secondary | ICD-10-CM | POA: Diagnosis not present

## 2020-01-19 DIAGNOSIS — E785 Hyperlipidemia, unspecified: Secondary | ICD-10-CM | POA: Diagnosis not present

## 2020-01-19 DIAGNOSIS — N185 Chronic kidney disease, stage 5: Secondary | ICD-10-CM | POA: Diagnosis not present

## 2020-01-19 DIAGNOSIS — I129 Hypertensive chronic kidney disease with stage 1 through stage 4 chronic kidney disease, or unspecified chronic kidney disease: Secondary | ICD-10-CM | POA: Diagnosis not present

## 2020-01-20 DIAGNOSIS — D509 Iron deficiency anemia, unspecified: Secondary | ICD-10-CM | POA: Diagnosis not present

## 2020-01-20 DIAGNOSIS — N2581 Secondary hyperparathyroidism of renal origin: Secondary | ICD-10-CM | POA: Diagnosis not present

## 2020-01-20 DIAGNOSIS — Z992 Dependence on renal dialysis: Secondary | ICD-10-CM | POA: Diagnosis not present

## 2020-01-20 DIAGNOSIS — D631 Anemia in chronic kidney disease: Secondary | ICD-10-CM | POA: Diagnosis not present

## 2020-01-20 DIAGNOSIS — E559 Vitamin D deficiency, unspecified: Secondary | ICD-10-CM | POA: Diagnosis not present

## 2020-01-20 DIAGNOSIS — N186 End stage renal disease: Secondary | ICD-10-CM | POA: Diagnosis not present

## 2020-01-23 DIAGNOSIS — D631 Anemia in chronic kidney disease: Secondary | ICD-10-CM | POA: Diagnosis not present

## 2020-01-23 DIAGNOSIS — Z992 Dependence on renal dialysis: Secondary | ICD-10-CM | POA: Diagnosis not present

## 2020-01-23 DIAGNOSIS — D509 Iron deficiency anemia, unspecified: Secondary | ICD-10-CM | POA: Diagnosis not present

## 2020-01-23 DIAGNOSIS — N186 End stage renal disease: Secondary | ICD-10-CM | POA: Diagnosis not present

## 2020-01-23 DIAGNOSIS — E559 Vitamin D deficiency, unspecified: Secondary | ICD-10-CM | POA: Diagnosis not present

## 2020-01-23 DIAGNOSIS — N2581 Secondary hyperparathyroidism of renal origin: Secondary | ICD-10-CM | POA: Diagnosis not present

## 2020-01-25 DIAGNOSIS — N186 End stage renal disease: Secondary | ICD-10-CM | POA: Diagnosis not present

## 2020-01-25 DIAGNOSIS — Z992 Dependence on renal dialysis: Secondary | ICD-10-CM | POA: Diagnosis not present

## 2020-01-25 DIAGNOSIS — D631 Anemia in chronic kidney disease: Secondary | ICD-10-CM | POA: Diagnosis not present

## 2020-01-25 DIAGNOSIS — D509 Iron deficiency anemia, unspecified: Secondary | ICD-10-CM | POA: Diagnosis not present

## 2020-01-25 DIAGNOSIS — N2581 Secondary hyperparathyroidism of renal origin: Secondary | ICD-10-CM | POA: Diagnosis not present

## 2020-01-25 DIAGNOSIS — E559 Vitamin D deficiency, unspecified: Secondary | ICD-10-CM | POA: Diagnosis not present

## 2020-01-27 DIAGNOSIS — E559 Vitamin D deficiency, unspecified: Secondary | ICD-10-CM | POA: Diagnosis not present

## 2020-01-27 DIAGNOSIS — N2581 Secondary hyperparathyroidism of renal origin: Secondary | ICD-10-CM | POA: Diagnosis not present

## 2020-01-27 DIAGNOSIS — D631 Anemia in chronic kidney disease: Secondary | ICD-10-CM | POA: Diagnosis not present

## 2020-01-27 DIAGNOSIS — D509 Iron deficiency anemia, unspecified: Secondary | ICD-10-CM | POA: Diagnosis not present

## 2020-01-27 DIAGNOSIS — Z992 Dependence on renal dialysis: Secondary | ICD-10-CM | POA: Diagnosis not present

## 2020-01-27 DIAGNOSIS — N186 End stage renal disease: Secondary | ICD-10-CM | POA: Diagnosis not present

## 2020-01-30 DIAGNOSIS — D509 Iron deficiency anemia, unspecified: Secondary | ICD-10-CM | POA: Diagnosis not present

## 2020-01-30 DIAGNOSIS — E559 Vitamin D deficiency, unspecified: Secondary | ICD-10-CM | POA: Diagnosis not present

## 2020-01-30 DIAGNOSIS — Z992 Dependence on renal dialysis: Secondary | ICD-10-CM | POA: Diagnosis not present

## 2020-01-30 DIAGNOSIS — D631 Anemia in chronic kidney disease: Secondary | ICD-10-CM | POA: Diagnosis not present

## 2020-01-30 DIAGNOSIS — N186 End stage renal disease: Secondary | ICD-10-CM | POA: Diagnosis not present

## 2020-01-30 DIAGNOSIS — N2581 Secondary hyperparathyroidism of renal origin: Secondary | ICD-10-CM | POA: Diagnosis not present

## 2020-02-01 DIAGNOSIS — N186 End stage renal disease: Secondary | ICD-10-CM | POA: Diagnosis not present

## 2020-02-01 DIAGNOSIS — Z992 Dependence on renal dialysis: Secondary | ICD-10-CM | POA: Diagnosis not present

## 2020-02-01 DIAGNOSIS — D631 Anemia in chronic kidney disease: Secondary | ICD-10-CM | POA: Diagnosis not present

## 2020-02-01 DIAGNOSIS — D509 Iron deficiency anemia, unspecified: Secondary | ICD-10-CM | POA: Diagnosis not present

## 2020-02-01 DIAGNOSIS — N2581 Secondary hyperparathyroidism of renal origin: Secondary | ICD-10-CM | POA: Diagnosis not present

## 2020-02-01 DIAGNOSIS — E559 Vitamin D deficiency, unspecified: Secondary | ICD-10-CM | POA: Diagnosis not present

## 2020-02-03 DIAGNOSIS — E559 Vitamin D deficiency, unspecified: Secondary | ICD-10-CM | POA: Diagnosis not present

## 2020-02-03 DIAGNOSIS — N2581 Secondary hyperparathyroidism of renal origin: Secondary | ICD-10-CM | POA: Diagnosis not present

## 2020-02-03 DIAGNOSIS — Z992 Dependence on renal dialysis: Secondary | ICD-10-CM | POA: Diagnosis not present

## 2020-02-03 DIAGNOSIS — D509 Iron deficiency anemia, unspecified: Secondary | ICD-10-CM | POA: Diagnosis not present

## 2020-02-03 DIAGNOSIS — D631 Anemia in chronic kidney disease: Secondary | ICD-10-CM | POA: Diagnosis not present

## 2020-02-03 DIAGNOSIS — N186 End stage renal disease: Secondary | ICD-10-CM | POA: Diagnosis not present

## 2020-02-06 DIAGNOSIS — D631 Anemia in chronic kidney disease: Secondary | ICD-10-CM | POA: Diagnosis not present

## 2020-02-06 DIAGNOSIS — N186 End stage renal disease: Secondary | ICD-10-CM | POA: Diagnosis not present

## 2020-02-06 DIAGNOSIS — N2581 Secondary hyperparathyroidism of renal origin: Secondary | ICD-10-CM | POA: Diagnosis not present

## 2020-02-06 DIAGNOSIS — Z992 Dependence on renal dialysis: Secondary | ICD-10-CM | POA: Diagnosis not present

## 2020-02-06 DIAGNOSIS — E559 Vitamin D deficiency, unspecified: Secondary | ICD-10-CM | POA: Diagnosis not present

## 2020-02-06 DIAGNOSIS — D509 Iron deficiency anemia, unspecified: Secondary | ICD-10-CM | POA: Diagnosis not present

## 2020-02-08 DIAGNOSIS — N186 End stage renal disease: Secondary | ICD-10-CM | POA: Diagnosis not present

## 2020-02-08 DIAGNOSIS — D509 Iron deficiency anemia, unspecified: Secondary | ICD-10-CM | POA: Diagnosis not present

## 2020-02-08 DIAGNOSIS — Z992 Dependence on renal dialysis: Secondary | ICD-10-CM | POA: Diagnosis not present

## 2020-02-08 DIAGNOSIS — E559 Vitamin D deficiency, unspecified: Secondary | ICD-10-CM | POA: Diagnosis not present

## 2020-02-08 DIAGNOSIS — N2581 Secondary hyperparathyroidism of renal origin: Secondary | ICD-10-CM | POA: Diagnosis not present

## 2020-02-08 DIAGNOSIS — D631 Anemia in chronic kidney disease: Secondary | ICD-10-CM | POA: Diagnosis not present

## 2020-02-09 NOTE — Progress Notes (Signed)
Cardiology Office Note  Date: 02/12/2020   ID: Dale, Torres 10-04-1925, MRN [id number]  PCP:  Monico Blitz, MD  Cardiologist:  Rozann Lesches, MD Electrophysiologist:  None   Chief Complaint  Patient presents with  . Cardiac follow-up    History of Present Illness: Dale Torres is a 87 y.o. male last seen in June 2021.  He is here today for follow-up with a family member.  States that he has been doing about the same, no major health changes since last visit.  He continues with dialysis T/TH/SAT.  He does not report any sense of palpitations or chest pain.  I personally reviewed his ECG today which shows ventricular pacing with underlying atrial fibrillation.  He has consistently declined anticoagulation.  He follows with Dr. Rayann Heman, Medtronic pacemaker in place.  Device interrogation in October 2021 revealed normal function with underlying permanent atrial fibrillation.  I reviewed his medications which are outlined below.   Past Medical History:  Diagnosis Date  . Anemia    Dr. Sonny Dandy  . Arthritis   . Benign prostatic hypertrophy   . Carpal tunnel syndrome of left wrist   . Coronary atherosclerosis of native coronary artery    Nonobstructive 2009  . ESRD on hemodialysis ([place])   . Essential hypertension   . GERD (gastroesophageal reflux disease)   . Headache   . History of pneumonia   . HOH (hard of hearing)    Bilateral  hearing aids  . Hypothyroidism   . Persistent atrial fibrillation (HCC)    Declines coumadin  . Presence of permanent cardiac pacemaker   . Sick sinus syndrome [facility])    Status post pacemaker placement 2008  . Skin cancer, basal cell     Past Surgical History:  Procedure Laterality Date  . A/V FISTULAGRAM N/A 01/06/2017   Procedure: A/V FISTULAGRAM;  Surgeon: Waynetta Sandy, MD;  Location: [place] [facility];  Service: Cardiovascular;  Laterality: N/A;  . APPENDECTOMY    . AV FISTULA PLACEMENT Left 08/16/2014   Procedure:  Left Arm creation of Brachiocephalic ARTERIOVENOUS (AV) FISTULA ;  Surgeon: Angelia Mould, MD;  Location: [place];  Service: Vascular;  Laterality: Left;  . AV FISTULA PLACEMENT Right 06/24/2016   Procedure: CREATION OF RIGHT RADIOCEPHALIC ARTERIOVENOUS (AV);  Surgeon: Rosetta Posner, MD;  Location: [place];  Service: Vascular;  Laterality: Right;  . CHOLECYSTECTOMY    . COLONOSCOPY W/ BIOPSIES AND POLYPECTOMY    . EYE SURGERY    . INSERTION OF DIALYSIS CATHETER Left 06/24/2016   Procedure: INSERTION OF DIALYSIS CATHETER - left Internal Jugular placement;  Surgeon: Rosetta Posner, MD;  Location: [place];  Service: Vascular;  Laterality: Left;  . IR FLUORO GUIDE CV LINE RIGHT  08/06/2016  . LAMINECTOMY    . LIGATION OF ARTERIOVENOUS  FISTULA Left 06/24/2016   Procedure: LIGATION OF left arm  ARTERIOVENOUS  FISTULA;  Surgeon: Rosetta Posner, MD;  Location: [place];  Service: Vascular;  Laterality: Left;  . PACEMAKER GENERATOR CHANGE  03/03/11   MDT Adaptal L implanted by Dr Rayann Heman  . PACEMAKER PLACEMENT  2008   Medtronic - Dr. Doreatha Lew  . PERIPHERAL VASCULAR CATHETERIZATION Left 03/06/2015   Procedure: Fistulagram;  Surgeon: Serafina Mitchell, MD;  Location: [place] CV LAB;  Service: Cardiovascular;  Laterality: Left;  . PERMANENT PACEMAKER GENERATOR CHANGE N/A 03/03/2011   Procedure: PERMANENT PACEMAKER GENERATOR CHANGE;  Surgeon: Thompson Grayer, MD;  Location: [facility] CATH LAB;  Service: Cardiovascular;  Laterality: N/A;  . Right rotator cuff repair    . THROMBECTOMY AND REVISION OF ARTERIOVENTOUS (AV) GORETEX  GRAFT Left 03/11/2015   Procedure: THROMBECTOMY AND REVISION OF ARTERIOVENTOUS (AV) GORETEX  GRAFT LEFT ARM;  Surgeon: Rosetta Posner, MD;  Location: [place];  Service: Vascular;  Laterality: Left;  . TONSILLECTOMY      Current Outpatient Medications  Medication Sig Dispense Refill  . amiodarone (PACERONE) 200 MG tablet Take 200 mg by mouth daily.    Marland Kitchen aspirin EC 81 MG tablet Take 81 mg by mouth at  bedtime.    . carvedilol (COREG) 3.125 MG tablet TAKE 1 TABLET TWICE A DAY, EXCEPT DO NOT TAKE THE MORNING DOSE ON YOUR DIALYSIS DAYS 180 tablet 2  . docusate sodium (COLACE) 100 MG capsule Take 100 mg by mouth daily.    . ferric citrate (AURYXIA) 1 GM 210 MG(Fe) tablet Take 1 tablet by mouth 3 (three) times daily with meals.    . folic acid (FOLVITE) 1 MG tablet Take 1 mg by mouth daily.    Marland Kitchen gabapentin (NEURONTIN) 400 MG capsule Take 400 mg by mouth at bedtime.    Marland Kitchen HYDROcodone-acetaminophen (NORCO) 10-325 MG tablet Take 1 tablet by mouth every 6 (six) hours as needed for severe pain. (Patient taking differently: Take 1 tablet by mouth 3 (three) times daily.) 15 tablet 0  . levothyroxine (SYNTHROID, LEVOTHROID) 125 MCG tablet Take 125 mcg by mouth daily at 2 PM. In the afternoon.    . Melatonin 5 MG TABS Take 5 mg by mouth at bedtime.    . midodrine (PROAMATINE) 10 MG tablet Take 10 mg by mouth 2 (two) times daily.     . naproxen sodium (ALEVE) 220 MG tablet Take 440 mg by mouth daily as needed (for pain.).    Marland Kitchen nitroGLYCERIN (NITROSTAT) 0.4 MG SL tablet Place 1 tablet (0.4 mg total) under the tongue every 5 (five) minutes as needed for chest pain (up to 3 doses. If no relief after 3rd dose, proceed to the ED for an evaluation or call 911). 25 tablet 3  . omeprazole (PRILOSEC) 20 MG capsule Take 20 mg by mouth at bedtime.    . tamsulosin (FLOMAX) 0.4 MG CAPS capsule Take 0.4 mg by mouth at bedtime.     No current facility-administered medications for this visit.   Allergies:  Oxycodone hcl   ROS: Hearing loss.  Physical Exam: VS:  BP (!) 90/54   Pulse 76   Ht _0  (1.753 m)   Wt 176 lb (79.8 kg)   BMI 25.99 kg/m , BMI Body mass index is 25.99 kg/m.  Wt Readings from Last 3 Encounters:  02/12/20 176 lb (79.8 kg)  07/12/19 169 lb 9.6 oz (76.9 kg)  10/28/18 163 lb 14.4 oz (74.3 kg)    General: Elderly male, using a walker. HEENT: Conjunctiva and lids normal, wearing a mask. Neck:  Supple, no elevated JVP or carotid bruits, no thyromegaly. Lungs: Clear to auscultation, nonlabored breathing at rest. Cardiac: Regular rate and rhythm, no S3, soft systolic murmur. Extremities: No pitting edema.  ECG:  An ECG dated 07/12/2019 was personally reviewed today and demonstrated:  Ventricular paced rhythm with lead motion artifact.  Recent Labwork:  September 2020: Hemoglobin 12.1, platelets 152, TSH 1.34, cholesterol 166, triglycerides 45, HDL 60, LDL 97, BUN 33, creatinine 5.18, potassium 4.7, AST 15, ALT 9  Other Studies Reviewed Today:  Echocardiogram 06/06/2017 [facility]): LVEF 25 to  30%, MAC with moderate mitral regurgitation, moderate left atrial enlargement, right ventricular dilatation with decreased contraction, mild to moderate tricuspid regurgitation, mild right atrial enlargement.  Assessment and Plan:  1.  Persistent atrial fibrillation.  CHA2DS2-VASc score is 4.  He has declined anticoagulation over time.  Continue Coreg and amiodarone, mainly for rate control, up titration of AV nodal blockers has been limited due to low blood pressure at baseline.  Check TSH and LFTs.  2.  Medtronic pacemaker in place.  Keep follow-up with Dr. Rayann Heman.  3.  History of cardiomyopathy with LVEF 25 to 30% range.  This has been managed conservatively without further testing.  Low blood pressure limits titration of medical therapy.  He is on low-dose Coreg.  Fluid status managed via hemodialysis.  4.  ESRD on hemodialysis.  Medication Adjustments/Labs and Tests Ordered: Current medicines are reviewed at length with the patient today.  Concerns regarding medicines are outlined above.   Tests Ordered: Orders Placed This Encounter  Procedures  . Hepatic function panel  . TSH  . EKG 12-Lead    Medication Changes: No orders of the defined types were placed in this encounter.   Disposition:  Follow up 6 months in the [place] office.  Signed, Satira Sark,  MD, [facility] 02/12/2020 8:49 AM    [place] at [place], [place], [place] [address] Phone: [phone]; Fax: [phone]

## 2020-02-10 DIAGNOSIS — N2581 Secondary hyperparathyroidism of renal origin: Secondary | ICD-10-CM | POA: Diagnosis not present

## 2020-02-10 DIAGNOSIS — Z992 Dependence on renal dialysis: Secondary | ICD-10-CM | POA: Diagnosis not present

## 2020-02-10 DIAGNOSIS — N186 End stage renal disease: Secondary | ICD-10-CM | POA: Diagnosis not present

## 2020-02-10 DIAGNOSIS — E559 Vitamin D deficiency, unspecified: Secondary | ICD-10-CM | POA: Diagnosis not present

## 2020-02-10 DIAGNOSIS — D631 Anemia in chronic kidney disease: Secondary | ICD-10-CM | POA: Diagnosis not present

## 2020-02-10 DIAGNOSIS — D509 Iron deficiency anemia, unspecified: Secondary | ICD-10-CM | POA: Diagnosis not present

## 2020-02-12 ENCOUNTER — Other Ambulatory Visit: Payer: Self-pay

## 2020-02-12 ENCOUNTER — Encounter: Payer: Self-pay | Admitting: Cardiology

## 2020-02-12 ENCOUNTER — Ambulatory Visit (INDEPENDENT_AMBULATORY_CARE_PROVIDER_SITE_OTHER): Payer: Medicare Other | Admitting: Cardiology

## 2020-02-12 VITALS — BP 90/54 | HR 76 | Ht 69.0 in | Wt 176.0 lb

## 2020-02-12 DIAGNOSIS — Z992 Dependence on renal dialysis: Secondary | ICD-10-CM

## 2020-02-12 DIAGNOSIS — I4821 Permanent atrial fibrillation: Secondary | ICD-10-CM

## 2020-02-12 DIAGNOSIS — N186 End stage renal disease: Secondary | ICD-10-CM | POA: Diagnosis not present

## 2020-02-12 DIAGNOSIS — I429 Cardiomyopathy, unspecified: Secondary | ICD-10-CM | POA: Diagnosis not present

## 2020-02-12 DIAGNOSIS — Z79899 Other long term (current) drug therapy: Secondary | ICD-10-CM | POA: Diagnosis not present

## 2020-02-12 NOTE — Patient Instructions (Signed)
Medication Instructions:   Your physician recommends that you continue on your current medications as directed. Please refer to the Current Medication list given to you today.  Labwork:  Your physician recommends that you return for lab work in: as soon as possible to check your LFT's and TSH. This may be done at [facility] or [facility].  Testing/Procedures:  None  Follow-Up:  Your physician recommends that you schedule a follow-up appointment in: 6 months.   Any Other Special Instructions Will Be Listed Below (If Applicable).  If you need a refill on your cardiac medications before your next appointment, please call your pharmacy.

## 2020-02-13 DIAGNOSIS — Z992 Dependence on renal dialysis: Secondary | ICD-10-CM | POA: Diagnosis not present

## 2020-02-13 DIAGNOSIS — N186 End stage renal disease: Secondary | ICD-10-CM | POA: Diagnosis not present

## 2020-02-13 DIAGNOSIS — E559 Vitamin D deficiency, unspecified: Secondary | ICD-10-CM | POA: Diagnosis not present

## 2020-02-13 DIAGNOSIS — D509 Iron deficiency anemia, unspecified: Secondary | ICD-10-CM | POA: Diagnosis not present

## 2020-02-13 DIAGNOSIS — D631 Anemia in chronic kidney disease: Secondary | ICD-10-CM | POA: Diagnosis not present

## 2020-02-13 DIAGNOSIS — N2581 Secondary hyperparathyroidism of renal origin: Secondary | ICD-10-CM | POA: Diagnosis not present

## 2020-02-15 DIAGNOSIS — D509 Iron deficiency anemia, unspecified: Secondary | ICD-10-CM | POA: Diagnosis not present

## 2020-02-15 DIAGNOSIS — N186 End stage renal disease: Secondary | ICD-10-CM | POA: Diagnosis not present

## 2020-02-15 DIAGNOSIS — Z992 Dependence on renal dialysis: Secondary | ICD-10-CM | POA: Diagnosis not present

## 2020-02-15 DIAGNOSIS — N2581 Secondary hyperparathyroidism of renal origin: Secondary | ICD-10-CM | POA: Diagnosis not present

## 2020-02-15 DIAGNOSIS — D631 Anemia in chronic kidney disease: Secondary | ICD-10-CM | POA: Diagnosis not present

## 2020-02-15 DIAGNOSIS — E559 Vitamin D deficiency, unspecified: Secondary | ICD-10-CM | POA: Diagnosis not present

## 2020-02-17 DIAGNOSIS — Z992 Dependence on renal dialysis: Secondary | ICD-10-CM | POA: Diagnosis not present

## 2020-02-17 DIAGNOSIS — D631 Anemia in chronic kidney disease: Secondary | ICD-10-CM | POA: Diagnosis not present

## 2020-02-17 DIAGNOSIS — N186 End stage renal disease: Secondary | ICD-10-CM | POA: Diagnosis not present

## 2020-02-17 DIAGNOSIS — D509 Iron deficiency anemia, unspecified: Secondary | ICD-10-CM | POA: Diagnosis not present

## 2020-02-17 DIAGNOSIS — E559 Vitamin D deficiency, unspecified: Secondary | ICD-10-CM | POA: Diagnosis not present

## 2020-02-17 DIAGNOSIS — N2581 Secondary hyperparathyroidism of renal origin: Secondary | ICD-10-CM | POA: Diagnosis not present

## 2020-02-19 DIAGNOSIS — Z992 Dependence on renal dialysis: Secondary | ICD-10-CM | POA: Diagnosis not present

## 2020-02-19 DIAGNOSIS — I129 Hypertensive chronic kidney disease with stage 1 through stage 4 chronic kidney disease, or unspecified chronic kidney disease: Secondary | ICD-10-CM | POA: Diagnosis not present

## 2020-02-19 DIAGNOSIS — E785 Hyperlipidemia, unspecified: Secondary | ICD-10-CM | POA: Diagnosis not present

## 2020-02-19 DIAGNOSIS — N186 End stage renal disease: Secondary | ICD-10-CM | POA: Diagnosis not present

## 2020-02-19 DIAGNOSIS — N185 Chronic kidney disease, stage 5: Secondary | ICD-10-CM | POA: Diagnosis not present

## 2020-02-20 DIAGNOSIS — D631 Anemia in chronic kidney disease: Secondary | ICD-10-CM | POA: Diagnosis not present

## 2020-02-20 DIAGNOSIS — N2581 Secondary hyperparathyroidism of renal origin: Secondary | ICD-10-CM | POA: Diagnosis not present

## 2020-02-20 DIAGNOSIS — D509 Iron deficiency anemia, unspecified: Secondary | ICD-10-CM | POA: Diagnosis not present

## 2020-02-20 DIAGNOSIS — Z992 Dependence on renal dialysis: Secondary | ICD-10-CM | POA: Diagnosis not present

## 2020-02-20 DIAGNOSIS — N186 End stage renal disease: Secondary | ICD-10-CM | POA: Diagnosis not present

## 2020-02-22 DIAGNOSIS — D509 Iron deficiency anemia, unspecified: Secondary | ICD-10-CM | POA: Diagnosis not present

## 2020-02-22 DIAGNOSIS — N186 End stage renal disease: Secondary | ICD-10-CM | POA: Diagnosis not present

## 2020-02-22 DIAGNOSIS — D631 Anemia in chronic kidney disease: Secondary | ICD-10-CM | POA: Diagnosis not present

## 2020-02-22 DIAGNOSIS — Z992 Dependence on renal dialysis: Secondary | ICD-10-CM | POA: Diagnosis not present

## 2020-02-22 DIAGNOSIS — N2581 Secondary hyperparathyroidism of renal origin: Secondary | ICD-10-CM | POA: Diagnosis not present

## 2020-02-23 DIAGNOSIS — Z6825 Body mass index (BMI) 25.0-25.9, adult: Secondary | ICD-10-CM | POA: Diagnosis not present

## 2020-02-23 DIAGNOSIS — I4891 Unspecified atrial fibrillation: Secondary | ICD-10-CM | POA: Diagnosis not present

## 2020-02-23 DIAGNOSIS — I770 Arteriovenous fistula, acquired: Secondary | ICD-10-CM | POA: Diagnosis not present

## 2020-02-23 DIAGNOSIS — M25569 Pain in unspecified knee: Secondary | ICD-10-CM | POA: Diagnosis not present

## 2020-02-23 DIAGNOSIS — Z789 Other specified health status: Secondary | ICD-10-CM | POA: Diagnosis not present

## 2020-02-23 DIAGNOSIS — Z8739 Personal history of other diseases of the musculoskeletal system and connective tissue: Secondary | ICD-10-CM | POA: Diagnosis not present

## 2020-02-23 DIAGNOSIS — Z299 Encounter for prophylactic measures, unspecified: Secondary | ICD-10-CM | POA: Diagnosis not present

## 2020-02-23 DIAGNOSIS — Z79899 Other long term (current) drug therapy: Secondary | ICD-10-CM | POA: Diagnosis not present

## 2020-02-24 DIAGNOSIS — Z992 Dependence on renal dialysis: Secondary | ICD-10-CM | POA: Diagnosis not present

## 2020-02-24 DIAGNOSIS — N186 End stage renal disease: Secondary | ICD-10-CM | POA: Diagnosis not present

## 2020-02-24 DIAGNOSIS — D509 Iron deficiency anemia, unspecified: Secondary | ICD-10-CM | POA: Diagnosis not present

## 2020-02-24 DIAGNOSIS — D631 Anemia in chronic kidney disease: Secondary | ICD-10-CM | POA: Diagnosis not present

## 2020-02-24 DIAGNOSIS — N2581 Secondary hyperparathyroidism of renal origin: Secondary | ICD-10-CM | POA: Diagnosis not present

## 2020-02-27 DIAGNOSIS — D509 Iron deficiency anemia, unspecified: Secondary | ICD-10-CM | POA: Diagnosis not present

## 2020-02-27 DIAGNOSIS — Z992 Dependence on renal dialysis: Secondary | ICD-10-CM | POA: Diagnosis not present

## 2020-02-27 DIAGNOSIS — N186 End stage renal disease: Secondary | ICD-10-CM | POA: Diagnosis not present

## 2020-02-27 DIAGNOSIS — N2581 Secondary hyperparathyroidism of renal origin: Secondary | ICD-10-CM | POA: Diagnosis not present

## 2020-02-27 DIAGNOSIS — D631 Anemia in chronic kidney disease: Secondary | ICD-10-CM | POA: Diagnosis not present

## 2020-02-29 DIAGNOSIS — Z992 Dependence on renal dialysis: Secondary | ICD-10-CM | POA: Diagnosis not present

## 2020-02-29 DIAGNOSIS — N2581 Secondary hyperparathyroidism of renal origin: Secondary | ICD-10-CM | POA: Diagnosis not present

## 2020-02-29 DIAGNOSIS — N186 End stage renal disease: Secondary | ICD-10-CM | POA: Diagnosis not present

## 2020-02-29 DIAGNOSIS — D509 Iron deficiency anemia, unspecified: Secondary | ICD-10-CM | POA: Diagnosis not present

## 2020-02-29 DIAGNOSIS — D631 Anemia in chronic kidney disease: Secondary | ICD-10-CM | POA: Diagnosis not present

## 2020-03-02 DIAGNOSIS — N186 End stage renal disease: Secondary | ICD-10-CM | POA: Diagnosis not present

## 2020-03-02 DIAGNOSIS — D631 Anemia in chronic kidney disease: Secondary | ICD-10-CM | POA: Diagnosis not present

## 2020-03-02 DIAGNOSIS — Z992 Dependence on renal dialysis: Secondary | ICD-10-CM | POA: Diagnosis not present

## 2020-03-02 DIAGNOSIS — N2581 Secondary hyperparathyroidism of renal origin: Secondary | ICD-10-CM | POA: Diagnosis not present

## 2020-03-02 DIAGNOSIS — D509 Iron deficiency anemia, unspecified: Secondary | ICD-10-CM | POA: Diagnosis not present

## 2020-03-05 DIAGNOSIS — Z992 Dependence on renal dialysis: Secondary | ICD-10-CM | POA: Diagnosis not present

## 2020-03-05 DIAGNOSIS — D509 Iron deficiency anemia, unspecified: Secondary | ICD-10-CM | POA: Diagnosis not present

## 2020-03-05 DIAGNOSIS — N186 End stage renal disease: Secondary | ICD-10-CM | POA: Diagnosis not present

## 2020-03-05 DIAGNOSIS — N2581 Secondary hyperparathyroidism of renal origin: Secondary | ICD-10-CM | POA: Diagnosis not present

## 2020-03-05 DIAGNOSIS — D631 Anemia in chronic kidney disease: Secondary | ICD-10-CM | POA: Diagnosis not present

## 2020-03-07 ENCOUNTER — Encounter: Payer: Self-pay | Admitting: *Deleted

## 2020-03-07 DIAGNOSIS — Z992 Dependence on renal dialysis: Secondary | ICD-10-CM | POA: Diagnosis not present

## 2020-03-07 DIAGNOSIS — N186 End stage renal disease: Secondary | ICD-10-CM | POA: Diagnosis not present

## 2020-03-07 DIAGNOSIS — D509 Iron deficiency anemia, unspecified: Secondary | ICD-10-CM | POA: Diagnosis not present

## 2020-03-07 DIAGNOSIS — D631 Anemia in chronic kidney disease: Secondary | ICD-10-CM | POA: Diagnosis not present

## 2020-03-07 DIAGNOSIS — N2581 Secondary hyperparathyroidism of renal origin: Secondary | ICD-10-CM | POA: Diagnosis not present

## 2020-03-09 DIAGNOSIS — Z992 Dependence on renal dialysis: Secondary | ICD-10-CM | POA: Diagnosis not present

## 2020-03-09 DIAGNOSIS — D509 Iron deficiency anemia, unspecified: Secondary | ICD-10-CM | POA: Diagnosis not present

## 2020-03-09 DIAGNOSIS — N2581 Secondary hyperparathyroidism of renal origin: Secondary | ICD-10-CM | POA: Diagnosis not present

## 2020-03-09 DIAGNOSIS — N186 End stage renal disease: Secondary | ICD-10-CM | POA: Diagnosis not present

## 2020-03-09 DIAGNOSIS — D631 Anemia in chronic kidney disease: Secondary | ICD-10-CM | POA: Diagnosis not present

## 2020-03-12 DIAGNOSIS — Z992 Dependence on renal dialysis: Secondary | ICD-10-CM | POA: Diagnosis not present

## 2020-03-12 DIAGNOSIS — N2581 Secondary hyperparathyroidism of renal origin: Secondary | ICD-10-CM | POA: Diagnosis not present

## 2020-03-12 DIAGNOSIS — D631 Anemia in chronic kidney disease: Secondary | ICD-10-CM | POA: Diagnosis not present

## 2020-03-12 DIAGNOSIS — N186 End stage renal disease: Secondary | ICD-10-CM | POA: Diagnosis not present

## 2020-03-12 DIAGNOSIS — D509 Iron deficiency anemia, unspecified: Secondary | ICD-10-CM | POA: Diagnosis not present

## 2020-03-14 DIAGNOSIS — N2581 Secondary hyperparathyroidism of renal origin: Secondary | ICD-10-CM | POA: Diagnosis not present

## 2020-03-14 DIAGNOSIS — D631 Anemia in chronic kidney disease: Secondary | ICD-10-CM | POA: Diagnosis not present

## 2020-03-14 DIAGNOSIS — D509 Iron deficiency anemia, unspecified: Secondary | ICD-10-CM | POA: Diagnosis not present

## 2020-03-14 DIAGNOSIS — N186 End stage renal disease: Secondary | ICD-10-CM | POA: Diagnosis not present

## 2020-03-14 DIAGNOSIS — Z992 Dependence on renal dialysis: Secondary | ICD-10-CM | POA: Diagnosis not present

## 2020-03-16 DIAGNOSIS — D509 Iron deficiency anemia, unspecified: Secondary | ICD-10-CM | POA: Diagnosis not present

## 2020-03-16 DIAGNOSIS — Z992 Dependence on renal dialysis: Secondary | ICD-10-CM | POA: Diagnosis not present

## 2020-03-16 DIAGNOSIS — D631 Anemia in chronic kidney disease: Secondary | ICD-10-CM | POA: Diagnosis not present

## 2020-03-16 DIAGNOSIS — N2581 Secondary hyperparathyroidism of renal origin: Secondary | ICD-10-CM | POA: Diagnosis not present

## 2020-03-16 DIAGNOSIS — N186 End stage renal disease: Secondary | ICD-10-CM | POA: Diagnosis not present

## 2020-03-18 DIAGNOSIS — N186 End stage renal disease: Secondary | ICD-10-CM | POA: Diagnosis not present

## 2020-03-18 DIAGNOSIS — Z992 Dependence on renal dialysis: Secondary | ICD-10-CM | POA: Diagnosis not present

## 2020-03-19 DIAGNOSIS — Z992 Dependence on renal dialysis: Secondary | ICD-10-CM | POA: Diagnosis not present

## 2020-03-19 DIAGNOSIS — D631 Anemia in chronic kidney disease: Secondary | ICD-10-CM | POA: Diagnosis not present

## 2020-03-19 DIAGNOSIS — N2581 Secondary hyperparathyroidism of renal origin: Secondary | ICD-10-CM | POA: Diagnosis not present

## 2020-03-19 DIAGNOSIS — N186 End stage renal disease: Secondary | ICD-10-CM | POA: Diagnosis not present

## 2020-03-19 DIAGNOSIS — D509 Iron deficiency anemia, unspecified: Secondary | ICD-10-CM | POA: Diagnosis not present

## 2020-03-20 DIAGNOSIS — I4891 Unspecified atrial fibrillation: Secondary | ICD-10-CM | POA: Diagnosis not present

## 2020-03-20 DIAGNOSIS — Z299 Encounter for prophylactic measures, unspecified: Secondary | ICD-10-CM | POA: Diagnosis not present

## 2020-03-20 DIAGNOSIS — M1990 Unspecified osteoarthritis, unspecified site: Secondary | ICD-10-CM | POA: Diagnosis not present

## 2020-03-20 DIAGNOSIS — Z992 Dependence on renal dialysis: Secondary | ICD-10-CM | POA: Diagnosis not present

## 2020-03-21 DIAGNOSIS — N2581 Secondary hyperparathyroidism of renal origin: Secondary | ICD-10-CM | POA: Diagnosis not present

## 2020-03-21 DIAGNOSIS — N186 End stage renal disease: Secondary | ICD-10-CM | POA: Diagnosis not present

## 2020-03-21 DIAGNOSIS — D509 Iron deficiency anemia, unspecified: Secondary | ICD-10-CM | POA: Diagnosis not present

## 2020-03-21 DIAGNOSIS — D631 Anemia in chronic kidney disease: Secondary | ICD-10-CM | POA: Diagnosis not present

## 2020-03-21 DIAGNOSIS — Z992 Dependence on renal dialysis: Secondary | ICD-10-CM | POA: Diagnosis not present

## 2020-03-23 DIAGNOSIS — N2581 Secondary hyperparathyroidism of renal origin: Secondary | ICD-10-CM | POA: Diagnosis not present

## 2020-03-23 DIAGNOSIS — D509 Iron deficiency anemia, unspecified: Secondary | ICD-10-CM | POA: Diagnosis not present

## 2020-03-23 DIAGNOSIS — Z992 Dependence on renal dialysis: Secondary | ICD-10-CM | POA: Diagnosis not present

## 2020-03-23 DIAGNOSIS — D631 Anemia in chronic kidney disease: Secondary | ICD-10-CM | POA: Diagnosis not present

## 2020-03-23 DIAGNOSIS — N186 End stage renal disease: Secondary | ICD-10-CM | POA: Diagnosis not present

## 2020-03-26 DIAGNOSIS — N186 End stage renal disease: Secondary | ICD-10-CM | POA: Diagnosis not present

## 2020-03-26 DIAGNOSIS — D631 Anemia in chronic kidney disease: Secondary | ICD-10-CM | POA: Diagnosis not present

## 2020-03-26 DIAGNOSIS — D509 Iron deficiency anemia, unspecified: Secondary | ICD-10-CM | POA: Diagnosis not present

## 2020-03-26 DIAGNOSIS — N2581 Secondary hyperparathyroidism of renal origin: Secondary | ICD-10-CM | POA: Diagnosis not present

## 2020-03-26 DIAGNOSIS — Z992 Dependence on renal dialysis: Secondary | ICD-10-CM | POA: Diagnosis not present

## 2020-03-27 ENCOUNTER — Other Ambulatory Visit: Payer: Self-pay | Admitting: Cardiology

## 2020-03-27 DIAGNOSIS — Z79899 Other long term (current) drug therapy: Secondary | ICD-10-CM | POA: Diagnosis not present

## 2020-03-27 DIAGNOSIS — I4821 Permanent atrial fibrillation: Secondary | ICD-10-CM | POA: Diagnosis not present

## 2020-03-28 DIAGNOSIS — D509 Iron deficiency anemia, unspecified: Secondary | ICD-10-CM | POA: Diagnosis not present

## 2020-03-28 DIAGNOSIS — N2581 Secondary hyperparathyroidism of renal origin: Secondary | ICD-10-CM | POA: Diagnosis not present

## 2020-03-28 DIAGNOSIS — Z992 Dependence on renal dialysis: Secondary | ICD-10-CM | POA: Diagnosis not present

## 2020-03-28 DIAGNOSIS — D631 Anemia in chronic kidney disease: Secondary | ICD-10-CM | POA: Diagnosis not present

## 2020-03-28 DIAGNOSIS — N186 End stage renal disease: Secondary | ICD-10-CM | POA: Diagnosis not present

## 2020-03-28 LAB — HEPATIC FUNCTION PANEL
AG Ratio: 1.9 (calc) (ref 1.0–2.5)
ALT: 10 U/L (ref 9–46)
AST: 17 U/L (ref 10–35)
Albumin: 4.3 g/dL (ref 3.6–5.1)
Alkaline phosphatase (APISO): 61 U/L (ref 35–144)
Bilirubin, Direct: 0.2 mg/dL (ref 0.0–0.2)
Globulin: 2.3 g/dL (calc) (ref 1.9–3.7)
Indirect Bilirubin: 0.5 mg/dL (calc) (ref 0.2–1.2)
Total Bilirubin: 0.7 mg/dL (ref 0.2–1.2)
Total Protein: 6.6 g/dL (ref 6.1–8.1)

## 2020-03-28 LAB — TSH: TSH: 1.7 mIU/L (ref 0.40–4.50)

## 2020-03-29 ENCOUNTER — Telehealth: Payer: Self-pay | Admitting: *Deleted

## 2020-03-29 NOTE — Telephone Encounter (Signed)
-----   Message from Satira Sark, MD sent at 03/29/2020  8:21 AM EST ----- Results reviewed.  LFTs and TSH normal.  Continue with current medications and follow-up plan.

## 2020-03-30 DIAGNOSIS — N186 End stage renal disease: Secondary | ICD-10-CM | POA: Diagnosis not present

## 2020-03-30 DIAGNOSIS — N2581 Secondary hyperparathyroidism of renal origin: Secondary | ICD-10-CM | POA: Diagnosis not present

## 2020-03-30 DIAGNOSIS — Z992 Dependence on renal dialysis: Secondary | ICD-10-CM | POA: Diagnosis not present

## 2020-03-30 DIAGNOSIS — D631 Anemia in chronic kidney disease: Secondary | ICD-10-CM | POA: Diagnosis not present

## 2020-03-30 DIAGNOSIS — D509 Iron deficiency anemia, unspecified: Secondary | ICD-10-CM | POA: Diagnosis not present

## 2020-04-02 DIAGNOSIS — N186 End stage renal disease: Secondary | ICD-10-CM | POA: Diagnosis not present

## 2020-04-02 DIAGNOSIS — D509 Iron deficiency anemia, unspecified: Secondary | ICD-10-CM | POA: Diagnosis not present

## 2020-04-02 DIAGNOSIS — N2581 Secondary hyperparathyroidism of renal origin: Secondary | ICD-10-CM | POA: Diagnosis not present

## 2020-04-02 DIAGNOSIS — D631 Anemia in chronic kidney disease: Secondary | ICD-10-CM | POA: Diagnosis not present

## 2020-04-02 DIAGNOSIS — Z992 Dependence on renal dialysis: Secondary | ICD-10-CM | POA: Diagnosis not present

## 2020-04-03 NOTE — Telephone Encounter (Signed)
Patient informed. Copy sent to PCP °

## 2020-04-04 DIAGNOSIS — N186 End stage renal disease: Secondary | ICD-10-CM | POA: Diagnosis not present

## 2020-04-04 DIAGNOSIS — Z992 Dependence on renal dialysis: Secondary | ICD-10-CM | POA: Diagnosis not present

## 2020-04-04 DIAGNOSIS — D509 Iron deficiency anemia, unspecified: Secondary | ICD-10-CM | POA: Diagnosis not present

## 2020-04-04 DIAGNOSIS — N2581 Secondary hyperparathyroidism of renal origin: Secondary | ICD-10-CM | POA: Diagnosis not present

## 2020-04-04 DIAGNOSIS — D631 Anemia in chronic kidney disease: Secondary | ICD-10-CM | POA: Diagnosis not present

## 2020-04-06 DIAGNOSIS — D509 Iron deficiency anemia, unspecified: Secondary | ICD-10-CM | POA: Diagnosis not present

## 2020-04-06 DIAGNOSIS — N186 End stage renal disease: Secondary | ICD-10-CM | POA: Diagnosis not present

## 2020-04-06 DIAGNOSIS — N2581 Secondary hyperparathyroidism of renal origin: Secondary | ICD-10-CM | POA: Diagnosis not present

## 2020-04-06 DIAGNOSIS — Z992 Dependence on renal dialysis: Secondary | ICD-10-CM | POA: Diagnosis not present

## 2020-04-06 DIAGNOSIS — D631 Anemia in chronic kidney disease: Secondary | ICD-10-CM | POA: Diagnosis not present

## 2020-04-09 DIAGNOSIS — D631 Anemia in chronic kidney disease: Secondary | ICD-10-CM | POA: Diagnosis not present

## 2020-04-09 DIAGNOSIS — N186 End stage renal disease: Secondary | ICD-10-CM | POA: Diagnosis not present

## 2020-04-09 DIAGNOSIS — N2581 Secondary hyperparathyroidism of renal origin: Secondary | ICD-10-CM | POA: Diagnosis not present

## 2020-04-09 DIAGNOSIS — D509 Iron deficiency anemia, unspecified: Secondary | ICD-10-CM | POA: Diagnosis not present

## 2020-04-09 DIAGNOSIS — Z992 Dependence on renal dialysis: Secondary | ICD-10-CM | POA: Diagnosis not present

## 2020-04-11 DIAGNOSIS — N2581 Secondary hyperparathyroidism of renal origin: Secondary | ICD-10-CM | POA: Diagnosis not present

## 2020-04-11 DIAGNOSIS — N186 End stage renal disease: Secondary | ICD-10-CM | POA: Diagnosis not present

## 2020-04-11 DIAGNOSIS — Z992 Dependence on renal dialysis: Secondary | ICD-10-CM | POA: Diagnosis not present

## 2020-04-11 DIAGNOSIS — D509 Iron deficiency anemia, unspecified: Secondary | ICD-10-CM | POA: Diagnosis not present

## 2020-04-11 DIAGNOSIS — D631 Anemia in chronic kidney disease: Secondary | ICD-10-CM | POA: Diagnosis not present

## 2020-04-13 DIAGNOSIS — N186 End stage renal disease: Secondary | ICD-10-CM | POA: Diagnosis not present

## 2020-04-13 DIAGNOSIS — D631 Anemia in chronic kidney disease: Secondary | ICD-10-CM | POA: Diagnosis not present

## 2020-04-13 DIAGNOSIS — N2581 Secondary hyperparathyroidism of renal origin: Secondary | ICD-10-CM | POA: Diagnosis not present

## 2020-04-13 DIAGNOSIS — Z992 Dependence on renal dialysis: Secondary | ICD-10-CM | POA: Diagnosis not present

## 2020-04-13 DIAGNOSIS — D509 Iron deficiency anemia, unspecified: Secondary | ICD-10-CM | POA: Diagnosis not present

## 2020-04-16 DIAGNOSIS — N186 End stage renal disease: Secondary | ICD-10-CM | POA: Diagnosis not present

## 2020-04-16 DIAGNOSIS — D631 Anemia in chronic kidney disease: Secondary | ICD-10-CM | POA: Diagnosis not present

## 2020-04-16 DIAGNOSIS — N2581 Secondary hyperparathyroidism of renal origin: Secondary | ICD-10-CM | POA: Diagnosis not present

## 2020-04-16 DIAGNOSIS — Z992 Dependence on renal dialysis: Secondary | ICD-10-CM | POA: Diagnosis not present

## 2020-04-16 DIAGNOSIS — D509 Iron deficiency anemia, unspecified: Secondary | ICD-10-CM | POA: Diagnosis not present

## 2020-04-17 DIAGNOSIS — N185 Chronic kidney disease, stage 5: Secondary | ICD-10-CM | POA: Diagnosis not present

## 2020-04-17 DIAGNOSIS — M109 Gout, unspecified: Secondary | ICD-10-CM | POA: Diagnosis not present

## 2020-04-17 DIAGNOSIS — E785 Hyperlipidemia, unspecified: Secondary | ICD-10-CM | POA: Diagnosis not present

## 2020-04-18 DIAGNOSIS — N2581 Secondary hyperparathyroidism of renal origin: Secondary | ICD-10-CM | POA: Diagnosis not present

## 2020-04-18 DIAGNOSIS — Z992 Dependence on renal dialysis: Secondary | ICD-10-CM | POA: Diagnosis not present

## 2020-04-18 DIAGNOSIS — D631 Anemia in chronic kidney disease: Secondary | ICD-10-CM | POA: Diagnosis not present

## 2020-04-18 DIAGNOSIS — D509 Iron deficiency anemia, unspecified: Secondary | ICD-10-CM | POA: Diagnosis not present

## 2020-04-18 DIAGNOSIS — N186 End stage renal disease: Secondary | ICD-10-CM | POA: Diagnosis not present

## 2020-04-20 DIAGNOSIS — N186 End stage renal disease: Secondary | ICD-10-CM | POA: Diagnosis not present

## 2020-04-20 DIAGNOSIS — D631 Anemia in chronic kidney disease: Secondary | ICD-10-CM | POA: Diagnosis not present

## 2020-04-20 DIAGNOSIS — E559 Vitamin D deficiency, unspecified: Secondary | ICD-10-CM | POA: Diagnosis not present

## 2020-04-20 DIAGNOSIS — N2581 Secondary hyperparathyroidism of renal origin: Secondary | ICD-10-CM | POA: Diagnosis not present

## 2020-04-20 DIAGNOSIS — Z992 Dependence on renal dialysis: Secondary | ICD-10-CM | POA: Diagnosis not present

## 2020-04-23 DIAGNOSIS — Z992 Dependence on renal dialysis: Secondary | ICD-10-CM | POA: Diagnosis not present

## 2020-04-23 DIAGNOSIS — N186 End stage renal disease: Secondary | ICD-10-CM | POA: Diagnosis not present

## 2020-04-23 DIAGNOSIS — N2581 Secondary hyperparathyroidism of renal origin: Secondary | ICD-10-CM | POA: Diagnosis not present

## 2020-04-23 DIAGNOSIS — E559 Vitamin D deficiency, unspecified: Secondary | ICD-10-CM | POA: Diagnosis not present

## 2020-04-23 DIAGNOSIS — D631 Anemia in chronic kidney disease: Secondary | ICD-10-CM | POA: Diagnosis not present

## 2020-04-24 DIAGNOSIS — Z299 Encounter for prophylactic measures, unspecified: Secondary | ICD-10-CM | POA: Diagnosis not present

## 2020-04-24 DIAGNOSIS — I770 Arteriovenous fistula, acquired: Secondary | ICD-10-CM | POA: Diagnosis not present

## 2020-04-24 DIAGNOSIS — Z992 Dependence on renal dialysis: Secondary | ICD-10-CM | POA: Diagnosis not present

## 2020-04-24 DIAGNOSIS — M1990 Unspecified osteoarthritis, unspecified site: Secondary | ICD-10-CM | POA: Diagnosis not present

## 2020-04-24 DIAGNOSIS — Z79899 Other long term (current) drug therapy: Secondary | ICD-10-CM | POA: Diagnosis not present

## 2020-04-25 DIAGNOSIS — Z992 Dependence on renal dialysis: Secondary | ICD-10-CM | POA: Diagnosis not present

## 2020-04-25 DIAGNOSIS — N186 End stage renal disease: Secondary | ICD-10-CM | POA: Diagnosis not present

## 2020-04-25 DIAGNOSIS — E559 Vitamin D deficiency, unspecified: Secondary | ICD-10-CM | POA: Diagnosis not present

## 2020-04-25 DIAGNOSIS — D631 Anemia in chronic kidney disease: Secondary | ICD-10-CM | POA: Diagnosis not present

## 2020-04-25 DIAGNOSIS — N2581 Secondary hyperparathyroidism of renal origin: Secondary | ICD-10-CM | POA: Diagnosis not present

## 2020-04-27 DIAGNOSIS — D631 Anemia in chronic kidney disease: Secondary | ICD-10-CM | POA: Diagnosis not present

## 2020-04-27 DIAGNOSIS — N186 End stage renal disease: Secondary | ICD-10-CM | POA: Diagnosis not present

## 2020-04-27 DIAGNOSIS — E559 Vitamin D deficiency, unspecified: Secondary | ICD-10-CM | POA: Diagnosis not present

## 2020-04-27 DIAGNOSIS — N2581 Secondary hyperparathyroidism of renal origin: Secondary | ICD-10-CM | POA: Diagnosis not present

## 2020-04-27 DIAGNOSIS — Z992 Dependence on renal dialysis: Secondary | ICD-10-CM | POA: Diagnosis not present

## 2020-04-30 DIAGNOSIS — E559 Vitamin D deficiency, unspecified: Secondary | ICD-10-CM | POA: Diagnosis not present

## 2020-04-30 DIAGNOSIS — N2581 Secondary hyperparathyroidism of renal origin: Secondary | ICD-10-CM | POA: Diagnosis not present

## 2020-04-30 DIAGNOSIS — D631 Anemia in chronic kidney disease: Secondary | ICD-10-CM | POA: Diagnosis not present

## 2020-04-30 DIAGNOSIS — N186 End stage renal disease: Secondary | ICD-10-CM | POA: Diagnosis not present

## 2020-04-30 DIAGNOSIS — Z992 Dependence on renal dialysis: Secondary | ICD-10-CM | POA: Diagnosis not present

## 2020-05-02 DIAGNOSIS — Z992 Dependence on renal dialysis: Secondary | ICD-10-CM | POA: Diagnosis not present

## 2020-05-02 DIAGNOSIS — R410 Disorientation, unspecified: Secondary | ICD-10-CM | POA: Diagnosis not present

## 2020-05-02 DIAGNOSIS — R404 Transient alteration of awareness: Secondary | ICD-10-CM | POA: Diagnosis not present

## 2020-05-02 DIAGNOSIS — R000 Tachycardia, unspecified: Secondary | ICD-10-CM | POA: Diagnosis not present

## 2020-05-02 DIAGNOSIS — E559 Vitamin D deficiency, unspecified: Secondary | ICD-10-CM | POA: Diagnosis not present

## 2020-05-02 DIAGNOSIS — D631 Anemia in chronic kidney disease: Secondary | ICD-10-CM | POA: Diagnosis not present

## 2020-05-02 DIAGNOSIS — N2581 Secondary hyperparathyroidism of renal origin: Secondary | ICD-10-CM | POA: Diagnosis not present

## 2020-05-02 DIAGNOSIS — R531 Weakness: Secondary | ICD-10-CM | POA: Diagnosis not present

## 2020-05-02 DIAGNOSIS — N186 End stage renal disease: Secondary | ICD-10-CM | POA: Diagnosis not present

## 2020-05-03 ENCOUNTER — Ambulatory Visit (INDEPENDENT_AMBULATORY_CARE_PROVIDER_SITE_OTHER): Payer: Medicare Other

## 2020-05-03 DIAGNOSIS — I429 Cardiomyopathy, unspecified: Secondary | ICD-10-CM | POA: Diagnosis not present

## 2020-05-04 DIAGNOSIS — N2581 Secondary hyperparathyroidism of renal origin: Secondary | ICD-10-CM | POA: Diagnosis not present

## 2020-05-04 DIAGNOSIS — D631 Anemia in chronic kidney disease: Secondary | ICD-10-CM | POA: Diagnosis not present

## 2020-05-04 DIAGNOSIS — N186 End stage renal disease: Secondary | ICD-10-CM | POA: Diagnosis not present

## 2020-05-04 DIAGNOSIS — E559 Vitamin D deficiency, unspecified: Secondary | ICD-10-CM | POA: Diagnosis not present

## 2020-05-04 DIAGNOSIS — Z992 Dependence on renal dialysis: Secondary | ICD-10-CM | POA: Diagnosis not present

## 2020-05-06 LAB — CUP PACEART REMOTE DEVICE CHECK
Battery Impedance: 1024 Ohm
Battery Remaining Longevity: 58 mo
Battery Voltage: 2.78 V
Brady Statistic AP VP Percent: 2 %
Brady Statistic AP VS Percent: 0 %
Brady Statistic AS VP Percent: 1 %
Brady Statistic AS VS Percent: 97 %
Date Time Interrogation Session: 20220415160500
Implantable Lead Implant Date: 20080604
Implantable Lead Implant Date: 20080604
Implantable Lead Location: 753859
Implantable Lead Location: 753860
Implantable Lead Model: 4469
Implantable Lead Model: 4470
Implantable Lead Serial Number: 498205
Implantable Lead Serial Number: 601713
Implantable Pulse Generator Implant Date: 20130212
Lead Channel Impedance Value: 448 Ohm
Lead Channel Impedance Value: 495 Ohm
Lead Channel Pacing Threshold Amplitude: 0.625 V
Lead Channel Pacing Threshold Amplitude: 0.625 V
Lead Channel Pacing Threshold Pulse Width: 0.4 ms
Lead Channel Pacing Threshold Pulse Width: 0.4 ms
Lead Channel Setting Pacing Amplitude: 2 V
Lead Channel Setting Pacing Amplitude: 2.5 V
Lead Channel Setting Pacing Pulse Width: 0.4 ms
Lead Channel Setting Sensing Sensitivity: 2.8 mV

## 2020-05-07 DIAGNOSIS — E559 Vitamin D deficiency, unspecified: Secondary | ICD-10-CM | POA: Diagnosis not present

## 2020-05-07 DIAGNOSIS — N2581 Secondary hyperparathyroidism of renal origin: Secondary | ICD-10-CM | POA: Diagnosis not present

## 2020-05-07 DIAGNOSIS — Z992 Dependence on renal dialysis: Secondary | ICD-10-CM | POA: Diagnosis not present

## 2020-05-07 DIAGNOSIS — D631 Anemia in chronic kidney disease: Secondary | ICD-10-CM | POA: Diagnosis not present

## 2020-05-07 DIAGNOSIS — N186 End stage renal disease: Secondary | ICD-10-CM | POA: Diagnosis not present

## 2020-05-09 DIAGNOSIS — Z992 Dependence on renal dialysis: Secondary | ICD-10-CM | POA: Diagnosis not present

## 2020-05-09 DIAGNOSIS — N2581 Secondary hyperparathyroidism of renal origin: Secondary | ICD-10-CM | POA: Diagnosis not present

## 2020-05-09 DIAGNOSIS — N186 End stage renal disease: Secondary | ICD-10-CM | POA: Diagnosis not present

## 2020-05-09 DIAGNOSIS — D631 Anemia in chronic kidney disease: Secondary | ICD-10-CM | POA: Diagnosis not present

## 2020-05-09 DIAGNOSIS — E559 Vitamin D deficiency, unspecified: Secondary | ICD-10-CM | POA: Diagnosis not present

## 2020-05-11 DIAGNOSIS — N186 End stage renal disease: Secondary | ICD-10-CM | POA: Diagnosis not present

## 2020-05-11 DIAGNOSIS — Z992 Dependence on renal dialysis: Secondary | ICD-10-CM | POA: Diagnosis not present

## 2020-05-11 DIAGNOSIS — N2581 Secondary hyperparathyroidism of renal origin: Secondary | ICD-10-CM | POA: Diagnosis not present

## 2020-05-11 DIAGNOSIS — D631 Anemia in chronic kidney disease: Secondary | ICD-10-CM | POA: Diagnosis not present

## 2020-05-11 DIAGNOSIS — E559 Vitamin D deficiency, unspecified: Secondary | ICD-10-CM | POA: Diagnosis not present

## 2020-05-14 DIAGNOSIS — I4891 Unspecified atrial fibrillation: Secondary | ICD-10-CM | POA: Diagnosis not present

## 2020-05-14 DIAGNOSIS — D631 Anemia in chronic kidney disease: Secondary | ICD-10-CM | POA: Diagnosis not present

## 2020-05-14 DIAGNOSIS — Z992 Dependence on renal dialysis: Secondary | ICD-10-CM | POA: Diagnosis not present

## 2020-05-14 DIAGNOSIS — N186 End stage renal disease: Secondary | ICD-10-CM | POA: Diagnosis not present

## 2020-05-14 DIAGNOSIS — J069 Acute upper respiratory infection, unspecified: Secondary | ICD-10-CM | POA: Diagnosis not present

## 2020-05-14 DIAGNOSIS — R059 Cough, unspecified: Secondary | ICD-10-CM | POA: Diagnosis not present

## 2020-05-14 DIAGNOSIS — Z299 Encounter for prophylactic measures, unspecified: Secondary | ICD-10-CM | POA: Diagnosis not present

## 2020-05-14 DIAGNOSIS — N2581 Secondary hyperparathyroidism of renal origin: Secondary | ICD-10-CM | POA: Diagnosis not present

## 2020-05-14 DIAGNOSIS — R0989 Other specified symptoms and signs involving the circulatory and respiratory systems: Secondary | ICD-10-CM | POA: Diagnosis not present

## 2020-05-14 DIAGNOSIS — E559 Vitamin D deficiency, unspecified: Secondary | ICD-10-CM | POA: Diagnosis not present

## 2020-05-14 DIAGNOSIS — Z789 Other specified health status: Secondary | ICD-10-CM | POA: Diagnosis not present

## 2020-05-16 DIAGNOSIS — D631 Anemia in chronic kidney disease: Secondary | ICD-10-CM | POA: Diagnosis not present

## 2020-05-16 DIAGNOSIS — N186 End stage renal disease: Secondary | ICD-10-CM | POA: Diagnosis not present

## 2020-05-16 DIAGNOSIS — Z992 Dependence on renal dialysis: Secondary | ICD-10-CM | POA: Diagnosis not present

## 2020-05-16 DIAGNOSIS — E559 Vitamin D deficiency, unspecified: Secondary | ICD-10-CM | POA: Diagnosis not present

## 2020-05-16 DIAGNOSIS — N2581 Secondary hyperparathyroidism of renal origin: Secondary | ICD-10-CM | POA: Diagnosis not present

## 2020-05-18 DIAGNOSIS — E559 Vitamin D deficiency, unspecified: Secondary | ICD-10-CM | POA: Diagnosis not present

## 2020-05-18 DIAGNOSIS — Z992 Dependence on renal dialysis: Secondary | ICD-10-CM | POA: Diagnosis not present

## 2020-05-18 DIAGNOSIS — N186 End stage renal disease: Secondary | ICD-10-CM | POA: Diagnosis not present

## 2020-05-18 DIAGNOSIS — D631 Anemia in chronic kidney disease: Secondary | ICD-10-CM | POA: Diagnosis not present

## 2020-05-18 DIAGNOSIS — N2581 Secondary hyperparathyroidism of renal origin: Secondary | ICD-10-CM | POA: Diagnosis not present

## 2020-05-21 DIAGNOSIS — N2581 Secondary hyperparathyroidism of renal origin: Secondary | ICD-10-CM | POA: Diagnosis not present

## 2020-05-21 DIAGNOSIS — D509 Iron deficiency anemia, unspecified: Secondary | ICD-10-CM | POA: Diagnosis not present

## 2020-05-21 DIAGNOSIS — D631 Anemia in chronic kidney disease: Secondary | ICD-10-CM | POA: Diagnosis not present

## 2020-05-21 DIAGNOSIS — N186 End stage renal disease: Secondary | ICD-10-CM | POA: Diagnosis not present

## 2020-05-21 DIAGNOSIS — Z992 Dependence on renal dialysis: Secondary | ICD-10-CM | POA: Diagnosis not present

## 2020-05-21 NOTE — Progress Notes (Signed)
Remote pacemaker transmission.   

## 2020-05-23 DIAGNOSIS — D509 Iron deficiency anemia, unspecified: Secondary | ICD-10-CM | POA: Diagnosis not present

## 2020-05-23 DIAGNOSIS — Z992 Dependence on renal dialysis: Secondary | ICD-10-CM | POA: Diagnosis not present

## 2020-05-23 DIAGNOSIS — N186 End stage renal disease: Secondary | ICD-10-CM | POA: Diagnosis not present

## 2020-05-23 DIAGNOSIS — N2581 Secondary hyperparathyroidism of renal origin: Secondary | ICD-10-CM | POA: Diagnosis not present

## 2020-05-23 DIAGNOSIS — D631 Anemia in chronic kidney disease: Secondary | ICD-10-CM | POA: Diagnosis not present

## 2020-05-24 DIAGNOSIS — I4891 Unspecified atrial fibrillation: Secondary | ICD-10-CM | POA: Diagnosis not present

## 2020-05-24 DIAGNOSIS — I770 Arteriovenous fistula, acquired: Secondary | ICD-10-CM | POA: Diagnosis not present

## 2020-05-24 DIAGNOSIS — M1990 Unspecified osteoarthritis, unspecified site: Secondary | ICD-10-CM | POA: Diagnosis not present

## 2020-05-24 DIAGNOSIS — I10 Essential (primary) hypertension: Secondary | ICD-10-CM | POA: Diagnosis not present

## 2020-05-24 DIAGNOSIS — Z6825 Body mass index (BMI) 25.0-25.9, adult: Secondary | ICD-10-CM | POA: Diagnosis not present

## 2020-05-24 DIAGNOSIS — Z79899 Other long term (current) drug therapy: Secondary | ICD-10-CM | POA: Diagnosis not present

## 2020-05-24 DIAGNOSIS — Z299 Encounter for prophylactic measures, unspecified: Secondary | ICD-10-CM | POA: Diagnosis not present

## 2020-05-25 DIAGNOSIS — N186 End stage renal disease: Secondary | ICD-10-CM | POA: Diagnosis not present

## 2020-05-25 DIAGNOSIS — D509 Iron deficiency anemia, unspecified: Secondary | ICD-10-CM | POA: Diagnosis not present

## 2020-05-25 DIAGNOSIS — Z992 Dependence on renal dialysis: Secondary | ICD-10-CM | POA: Diagnosis not present

## 2020-05-25 DIAGNOSIS — N2581 Secondary hyperparathyroidism of renal origin: Secondary | ICD-10-CM | POA: Diagnosis not present

## 2020-05-25 DIAGNOSIS — D631 Anemia in chronic kidney disease: Secondary | ICD-10-CM | POA: Diagnosis not present

## 2020-05-28 DIAGNOSIS — N186 End stage renal disease: Secondary | ICD-10-CM | POA: Diagnosis not present

## 2020-05-28 DIAGNOSIS — D509 Iron deficiency anemia, unspecified: Secondary | ICD-10-CM | POA: Diagnosis not present

## 2020-05-28 DIAGNOSIS — Z992 Dependence on renal dialysis: Secondary | ICD-10-CM | POA: Diagnosis not present

## 2020-05-28 DIAGNOSIS — N2581 Secondary hyperparathyroidism of renal origin: Secondary | ICD-10-CM | POA: Diagnosis not present

## 2020-05-28 DIAGNOSIS — D631 Anemia in chronic kidney disease: Secondary | ICD-10-CM | POA: Diagnosis not present

## 2020-05-30 DIAGNOSIS — D509 Iron deficiency anemia, unspecified: Secondary | ICD-10-CM | POA: Diagnosis not present

## 2020-05-30 DIAGNOSIS — N2581 Secondary hyperparathyroidism of renal origin: Secondary | ICD-10-CM | POA: Diagnosis not present

## 2020-05-30 DIAGNOSIS — Z992 Dependence on renal dialysis: Secondary | ICD-10-CM | POA: Diagnosis not present

## 2020-05-30 DIAGNOSIS — N186 End stage renal disease: Secondary | ICD-10-CM | POA: Diagnosis not present

## 2020-05-30 DIAGNOSIS — D631 Anemia in chronic kidney disease: Secondary | ICD-10-CM | POA: Diagnosis not present

## 2020-05-31 DIAGNOSIS — I10 Essential (primary) hypertension: Secondary | ICD-10-CM | POA: Diagnosis not present

## 2020-05-31 DIAGNOSIS — Z299 Encounter for prophylactic measures, unspecified: Secondary | ICD-10-CM | POA: Diagnosis not present

## 2020-05-31 DIAGNOSIS — Z950 Presence of cardiac pacemaker: Secondary | ICD-10-CM | POA: Diagnosis not present

## 2020-05-31 DIAGNOSIS — Z789 Other specified health status: Secondary | ICD-10-CM | POA: Diagnosis not present

## 2020-05-31 DIAGNOSIS — H6123 Impacted cerumen, bilateral: Secondary | ICD-10-CM | POA: Diagnosis not present

## 2020-06-01 DIAGNOSIS — Z992 Dependence on renal dialysis: Secondary | ICD-10-CM | POA: Diagnosis not present

## 2020-06-01 DIAGNOSIS — D631 Anemia in chronic kidney disease: Secondary | ICD-10-CM | POA: Diagnosis not present

## 2020-06-01 DIAGNOSIS — N186 End stage renal disease: Secondary | ICD-10-CM | POA: Diagnosis not present

## 2020-06-01 DIAGNOSIS — N2581 Secondary hyperparathyroidism of renal origin: Secondary | ICD-10-CM | POA: Diagnosis not present

## 2020-06-01 DIAGNOSIS — D509 Iron deficiency anemia, unspecified: Secondary | ICD-10-CM | POA: Diagnosis not present

## 2020-06-03 ENCOUNTER — Other Ambulatory Visit: Payer: Self-pay

## 2020-06-03 ENCOUNTER — Encounter: Payer: Self-pay | Admitting: Internal Medicine

## 2020-06-03 ENCOUNTER — Ambulatory Visit (INDEPENDENT_AMBULATORY_CARE_PROVIDER_SITE_OTHER): Payer: Medicare Other | Admitting: Internal Medicine

## 2020-06-03 VITALS — Ht 69.0 in | Wt 168.0 lb

## 2020-06-03 DIAGNOSIS — I4811 Longstanding persistent atrial fibrillation: Secondary | ICD-10-CM | POA: Diagnosis not present

## 2020-06-03 DIAGNOSIS — R001 Bradycardia, unspecified: Secondary | ICD-10-CM

## 2020-06-03 DIAGNOSIS — I428 Other cardiomyopathies: Secondary | ICD-10-CM | POA: Diagnosis not present

## 2020-06-03 NOTE — Progress Notes (Signed)
Electrophysiology TeleHealth Note   Due to national recommendations of social distancing due to COVID 19, an audio/video telehealth visit is felt to be most appropriate for this patient at this time.  See MyChart message from today for the patient's consent to telehealth for [facility].  Date:  06/03/2020   ID:  Dale Torres, DOB Jun 03, 1925, MRN [id number]  Location: patient's home  Provider location:  [place]  Evaluation Performed: Follow-up visit  PCP:  Monico Blitz, MD   Electrophysiologist:  Dr Rayann Heman  Chief Complaint:  palpitations  History of Present Illness:    Dale Torres is a 87 y.o. male who presents via telehealth conferencing today.  Since last being seen in our clinic, the patient reports doing very well.  He is on HD.  He has stable SOB and edema.  Today, he denies symptoms of palpitations, chest pain, dizziness, presyncope, or syncope.  His primary concern is with knee pain/ arthritis.  The patient is otherwise without complaint today.   Past Medical History:  Diagnosis Date  . Anemia    Dr. Sonny Dandy  . Arthritis   . Benign prostatic hypertrophy   . Carpal tunnel syndrome of left wrist   . Coronary atherosclerosis of native coronary artery    Nonobstructive 2009  . ESRD on hemodialysis ([place])   . Essential hypertension   . GERD (gastroesophageal reflux disease)   . Headache   . History of pneumonia   . HOH (hard of hearing)    Bilateral  hearing aids  . Hypothyroidism   . Persistent atrial fibrillation (HCC)    Declines coumadin  . Presence of permanent cardiac pacemaker   . Sick sinus syndrome [facility])    Status post pacemaker placement 2008  . Skin cancer, basal cell     Past Surgical History:  Procedure Laterality Date  . A/V FISTULAGRAM N/A 01/06/2017   Procedure: A/V FISTULAGRAM;  Surgeon: Waynetta Sandy, MD;  Location: [place] CV [facility];  Service: Cardiovascular;  Laterality: N/A;  . APPENDECTOMY    . AV FISTULA  PLACEMENT Left 08/16/2014   Procedure: Left Arm creation of Brachiocephalic ARTERIOVENOUS (AV) FISTULA ;  Surgeon: Angelia Mould, MD;  Location: [place];  Service: Vascular;  Laterality: Left;  . AV FISTULA PLACEMENT Right 06/24/2016   Procedure: CREATION OF RIGHT RADIOCEPHALIC ARTERIOVENOUS (AV);  Surgeon: Rosetta Posner, MD;  Location: [place];  Service: Vascular;  Laterality: Right;  . CHOLECYSTECTOMY    . COLONOSCOPY W/ BIOPSIES AND POLYPECTOMY    . EYE SURGERY    . INSERTION OF DIALYSIS CATHETER Left 06/24/2016   Procedure: INSERTION OF DIALYSIS CATHETER - left Internal Jugular placement;  Surgeon: Rosetta Posner, MD;  Location: [place];  Service: Vascular;  Laterality: Left;  . IR FLUORO GUIDE CV LINE RIGHT  08/06/2016  . LAMINECTOMY    . LIGATION OF ARTERIOVENOUS  FISTULA Left 06/24/2016   Procedure: LIGATION OF left arm  ARTERIOVENOUS  FISTULA;  Surgeon: Rosetta Posner, MD;  Location: [place];  Service: Vascular;  Laterality: Left;  . PACEMAKER GENERATOR CHANGE  03/03/11   MDT Adaptal L implanted by Dr Rayann Heman  . PACEMAKER PLACEMENT  2008   Medtronic - Dr. Doreatha Lew  . PERIPHERAL VASCULAR CATHETERIZATION Left 03/06/2015   Procedure: Fistulagram;  Surgeon: Serafina Mitchell, MD;  Location: [place] CV LAB;  Service: Cardiovascular;  Laterality: Left;  . PERMANENT PACEMAKER GENERATOR CHANGE N/A 03/03/2011   Procedure: PERMANENT PACEMAKER GENERATOR CHANGE;  Surgeon: Thompson Grayer, MD;  Location: [facility] CATH LAB;  Service: Cardiovascular;  Laterality: N/A;  . Right rotator cuff repair    . THROMBECTOMY AND REVISION OF ARTERIOVENTOUS (AV) GORETEX  GRAFT Left 03/11/2015   Procedure: THROMBECTOMY AND REVISION OF ARTERIOVENTOUS (AV) GORETEX  GRAFT LEFT ARM;  Surgeon: Rosetta Posner, MD;  Location: [place];  Service: Vascular;  Laterality: Left;  . TONSILLECTOMY      Current Outpatient Medications  Medication Sig Dispense Refill  . amiodarone (PACERONE) 200 MG tablet Take 200 mg by mouth daily.    Marland Kitchen aspirin EC 81  MG tablet Take 81 mg by mouth at bedtime.    Marland Kitchen azelastine (ASTELIN) 0.1 % nasal spray Place 1 spray into both nostrils 2 (two) times daily. Use in each nostril as directed    . carvedilol (COREG) 3.125 MG tablet TAKE 1 TABLET TWICE A DAY, EXCEPT DO NOT TAKE THE MORNING DOSE ON YOUR DIALYSIS DAYS 180 tablet 2  . docusate sodium (COLACE) 100 MG capsule Take 100 mg by mouth daily.    . ferric citrate (AURYXIA) 1 GM 210 MG(Fe) tablet Take 1 tablet by mouth 3 (three) times daily with meals.    . folic acid (FOLVITE) 1 MG tablet Take 1 mg by mouth daily.    Marland Kitchen gabapentin (NEURONTIN) 400 MG capsule Take 400 mg by mouth at bedtime.    Marland Kitchen HYDROcodone-acetaminophen (NORCO) 10-325 MG tablet Take 1 tablet by mouth every 6 (six) hours as needed for severe pain. 15 tablet 0  . levothyroxine (SYNTHROID, LEVOTHROID) 125 MCG tablet Take 125 mcg by mouth daily at 2 PM. In the afternoon.    . Melatonin 5 MG TABS Take 5 mg by mouth at bedtime.    . midodrine (PROAMATINE) 10 MG tablet Take 10 mg by mouth 2 (two) times daily.     . naproxen sodium (ALEVE) 220 MG tablet Take 440 mg by mouth daily as needed (for pain.).    Marland Kitchen nitroGLYCERIN (NITROSTAT) 0.4 MG SL tablet Place 1 tablet (0.4 mg total) under the tongue every 5 (five) minutes as needed for chest pain (up to 3 doses. If no relief after 3rd dose, proceed to the ED for an evaluation or call 911). 25 tablet 3  . omeprazole (PRILOSEC) 20 MG capsule Take 20 mg by mouth at bedtime.    . tamsulosin (FLOMAX) 0.4 MG CAPS capsule Take 0.4 mg by mouth at bedtime.     No current facility-administered medications for this visit.    Allergies:   Oxycodone hcl   Social History:  The patient  reports that he quit smoking about 60 years ago. His smoking use included cigarettes. He started smoking about 72 years ago. He has a 9.60 pack-year smoking history. He quit smokeless tobacco use about 40 years ago.  His smokeless tobacco use included chew. He reports current alcohol use.  He reports that he does not use drugs.   ROS:  Please see the history of present illness.   All other systems are personally reviewed and negative.    Exam:    Vital Signs:  Ht 5\' 9"  (1.753 m)   Wt 168 lb (76.2 kg)   BMI 24.81 kg/m   elderly appearing, alert and conversant, regular work of breathing,  good skin color Eyes- anicteric, neuro- grossly intact, skin- no apparent rash or lesions or cyanosis, mouth- oral mucosa is pink  Labs/Other Tests and Data Reviewed:    Recent Labs: 03/27/2020: ALT 10; TSH 1.70  Wt Readings from Last 3 Encounters:  06/03/20 168 lb (76.2 kg)  02/12/20 176 lb (79.8 kg)  07/12/19 169 lb 9.6 oz (76.9 kg)     Last device remote is reviewed from [place] PDF which reveals normal device function, persistent afib,  V rates are mostly controlled   ASSESSMENT & PLAN:    1.  Sinus bradycardia Normal pacemaker function by remotes Consider VVIR programming on return  2. Permanent afib Rate controlled with amiodarone.  Labs from 3/22 reviewed  3. Nonischemic CM No CHF symptoms Fluid managed with HD   Risks, benefits and potential toxicities for medications prescribed and/or refilled reviewed with patient today.   Follow-up:  Return in a year   Patient Risk:  after full review of this patients clinical status, I feel that they are at moderate risk at this time.  Today, I have spent 15 minutes with the patient with telehealth technology discussing arrhythmia management .    Army Fossa, MD  06/03/2020 2:44 PM     [facility] [place] [address] [place] [address] [phone] (office) [phone] (fax)

## 2020-06-04 DIAGNOSIS — Z992 Dependence on renal dialysis: Secondary | ICD-10-CM | POA: Diagnosis not present

## 2020-06-04 DIAGNOSIS — N2581 Secondary hyperparathyroidism of renal origin: Secondary | ICD-10-CM | POA: Diagnosis not present

## 2020-06-04 DIAGNOSIS — N186 End stage renal disease: Secondary | ICD-10-CM | POA: Diagnosis not present

## 2020-06-04 DIAGNOSIS — D631 Anemia in chronic kidney disease: Secondary | ICD-10-CM | POA: Diagnosis not present

## 2020-06-04 DIAGNOSIS — D509 Iron deficiency anemia, unspecified: Secondary | ICD-10-CM | POA: Diagnosis not present

## 2020-06-06 DIAGNOSIS — Z992 Dependence on renal dialysis: Secondary | ICD-10-CM | POA: Diagnosis not present

## 2020-06-06 DIAGNOSIS — D509 Iron deficiency anemia, unspecified: Secondary | ICD-10-CM | POA: Diagnosis not present

## 2020-06-06 DIAGNOSIS — D631 Anemia in chronic kidney disease: Secondary | ICD-10-CM | POA: Diagnosis not present

## 2020-06-06 DIAGNOSIS — N186 End stage renal disease: Secondary | ICD-10-CM | POA: Diagnosis not present

## 2020-06-06 DIAGNOSIS — N2581 Secondary hyperparathyroidism of renal origin: Secondary | ICD-10-CM | POA: Diagnosis not present

## 2020-06-08 DIAGNOSIS — D631 Anemia in chronic kidney disease: Secondary | ICD-10-CM | POA: Diagnosis not present

## 2020-06-08 DIAGNOSIS — D509 Iron deficiency anemia, unspecified: Secondary | ICD-10-CM | POA: Diagnosis not present

## 2020-06-08 DIAGNOSIS — Z992 Dependence on renal dialysis: Secondary | ICD-10-CM | POA: Diagnosis not present

## 2020-06-08 DIAGNOSIS — N2581 Secondary hyperparathyroidism of renal origin: Secondary | ICD-10-CM | POA: Diagnosis not present

## 2020-06-08 DIAGNOSIS — N186 End stage renal disease: Secondary | ICD-10-CM | POA: Diagnosis not present

## 2020-06-11 DIAGNOSIS — D509 Iron deficiency anemia, unspecified: Secondary | ICD-10-CM | POA: Diagnosis not present

## 2020-06-11 DIAGNOSIS — D631 Anemia in chronic kidney disease: Secondary | ICD-10-CM | POA: Diagnosis not present

## 2020-06-11 DIAGNOSIS — N2581 Secondary hyperparathyroidism of renal origin: Secondary | ICD-10-CM | POA: Diagnosis not present

## 2020-06-11 DIAGNOSIS — Z992 Dependence on renal dialysis: Secondary | ICD-10-CM | POA: Diagnosis not present

## 2020-06-11 DIAGNOSIS — N186 End stage renal disease: Secondary | ICD-10-CM | POA: Diagnosis not present

## 2020-06-13 DIAGNOSIS — N2581 Secondary hyperparathyroidism of renal origin: Secondary | ICD-10-CM | POA: Diagnosis not present

## 2020-06-13 DIAGNOSIS — D631 Anemia in chronic kidney disease: Secondary | ICD-10-CM | POA: Diagnosis not present

## 2020-06-13 DIAGNOSIS — Z992 Dependence on renal dialysis: Secondary | ICD-10-CM | POA: Diagnosis not present

## 2020-06-13 DIAGNOSIS — D509 Iron deficiency anemia, unspecified: Secondary | ICD-10-CM | POA: Diagnosis not present

## 2020-06-13 DIAGNOSIS — N186 End stage renal disease: Secondary | ICD-10-CM | POA: Diagnosis not present

## 2020-06-15 DIAGNOSIS — D631 Anemia in chronic kidney disease: Secondary | ICD-10-CM | POA: Diagnosis not present

## 2020-06-15 DIAGNOSIS — D509 Iron deficiency anemia, unspecified: Secondary | ICD-10-CM | POA: Diagnosis not present

## 2020-06-15 DIAGNOSIS — N2581 Secondary hyperparathyroidism of renal origin: Secondary | ICD-10-CM | POA: Diagnosis not present

## 2020-06-15 DIAGNOSIS — Z992 Dependence on renal dialysis: Secondary | ICD-10-CM | POA: Diagnosis not present

## 2020-06-15 DIAGNOSIS — N186 End stage renal disease: Secondary | ICD-10-CM | POA: Diagnosis not present

## 2020-06-18 DIAGNOSIS — D631 Anemia in chronic kidney disease: Secondary | ICD-10-CM | POA: Diagnosis not present

## 2020-06-18 DIAGNOSIS — Z992 Dependence on renal dialysis: Secondary | ICD-10-CM | POA: Diagnosis not present

## 2020-06-18 DIAGNOSIS — N186 End stage renal disease: Secondary | ICD-10-CM | POA: Diagnosis not present

## 2020-06-18 DIAGNOSIS — D509 Iron deficiency anemia, unspecified: Secondary | ICD-10-CM | POA: Diagnosis not present

## 2020-06-18 DIAGNOSIS — N2581 Secondary hyperparathyroidism of renal origin: Secondary | ICD-10-CM | POA: Diagnosis not present

## 2020-06-20 DIAGNOSIS — D509 Iron deficiency anemia, unspecified: Secondary | ICD-10-CM | POA: Diagnosis not present

## 2020-06-20 DIAGNOSIS — N2581 Secondary hyperparathyroidism of renal origin: Secondary | ICD-10-CM | POA: Diagnosis not present

## 2020-06-20 DIAGNOSIS — Z992 Dependence on renal dialysis: Secondary | ICD-10-CM | POA: Diagnosis not present

## 2020-06-20 DIAGNOSIS — D631 Anemia in chronic kidney disease: Secondary | ICD-10-CM | POA: Diagnosis not present

## 2020-06-20 DIAGNOSIS — N186 End stage renal disease: Secondary | ICD-10-CM | POA: Diagnosis not present

## 2020-06-22 DIAGNOSIS — N186 End stage renal disease: Secondary | ICD-10-CM | POA: Diagnosis not present

## 2020-06-22 DIAGNOSIS — Z992 Dependence on renal dialysis: Secondary | ICD-10-CM | POA: Diagnosis not present

## 2020-06-22 DIAGNOSIS — D631 Anemia in chronic kidney disease: Secondary | ICD-10-CM | POA: Diagnosis not present

## 2020-06-22 DIAGNOSIS — N2581 Secondary hyperparathyroidism of renal origin: Secondary | ICD-10-CM | POA: Diagnosis not present

## 2020-06-22 DIAGNOSIS — D509 Iron deficiency anemia, unspecified: Secondary | ICD-10-CM | POA: Diagnosis not present

## 2020-06-25 DIAGNOSIS — N2581 Secondary hyperparathyroidism of renal origin: Secondary | ICD-10-CM | POA: Diagnosis not present

## 2020-06-25 DIAGNOSIS — Z992 Dependence on renal dialysis: Secondary | ICD-10-CM | POA: Diagnosis not present

## 2020-06-25 DIAGNOSIS — D631 Anemia in chronic kidney disease: Secondary | ICD-10-CM | POA: Diagnosis not present

## 2020-06-25 DIAGNOSIS — D509 Iron deficiency anemia, unspecified: Secondary | ICD-10-CM | POA: Diagnosis not present

## 2020-06-25 DIAGNOSIS — N186 End stage renal disease: Secondary | ICD-10-CM | POA: Diagnosis not present

## 2020-06-26 DIAGNOSIS — M1990 Unspecified osteoarthritis, unspecified site: Secondary | ICD-10-CM | POA: Diagnosis not present

## 2020-06-26 DIAGNOSIS — Z299 Encounter for prophylactic measures, unspecified: Secondary | ICD-10-CM | POA: Diagnosis not present

## 2020-06-26 DIAGNOSIS — I4891 Unspecified atrial fibrillation: Secondary | ICD-10-CM | POA: Diagnosis not present

## 2020-06-26 DIAGNOSIS — Z79899 Other long term (current) drug therapy: Secondary | ICD-10-CM | POA: Diagnosis not present

## 2020-06-26 DIAGNOSIS — Z289 Immunization not carried out for unspecified reason: Secondary | ICD-10-CM | POA: Diagnosis not present

## 2020-06-26 DIAGNOSIS — I10 Essential (primary) hypertension: Secondary | ICD-10-CM | POA: Diagnosis not present

## 2020-06-26 DIAGNOSIS — Z789 Other specified health status: Secondary | ICD-10-CM | POA: Diagnosis not present

## 2020-06-27 DIAGNOSIS — D631 Anemia in chronic kidney disease: Secondary | ICD-10-CM | POA: Diagnosis not present

## 2020-06-27 DIAGNOSIS — D509 Iron deficiency anemia, unspecified: Secondary | ICD-10-CM | POA: Diagnosis not present

## 2020-06-27 DIAGNOSIS — Z992 Dependence on renal dialysis: Secondary | ICD-10-CM | POA: Diagnosis not present

## 2020-06-27 DIAGNOSIS — N2581 Secondary hyperparathyroidism of renal origin: Secondary | ICD-10-CM | POA: Diagnosis not present

## 2020-06-27 DIAGNOSIS — N186 End stage renal disease: Secondary | ICD-10-CM | POA: Diagnosis not present

## 2020-06-29 DIAGNOSIS — D631 Anemia in chronic kidney disease: Secondary | ICD-10-CM | POA: Diagnosis not present

## 2020-06-29 DIAGNOSIS — D509 Iron deficiency anemia, unspecified: Secondary | ICD-10-CM | POA: Diagnosis not present

## 2020-06-29 DIAGNOSIS — N186 End stage renal disease: Secondary | ICD-10-CM | POA: Diagnosis not present

## 2020-06-29 DIAGNOSIS — Z992 Dependence on renal dialysis: Secondary | ICD-10-CM | POA: Diagnosis not present

## 2020-06-29 DIAGNOSIS — N2581 Secondary hyperparathyroidism of renal origin: Secondary | ICD-10-CM | POA: Diagnosis not present

## 2020-07-02 DIAGNOSIS — N2581 Secondary hyperparathyroidism of renal origin: Secondary | ICD-10-CM | POA: Diagnosis not present

## 2020-07-02 DIAGNOSIS — D509 Iron deficiency anemia, unspecified: Secondary | ICD-10-CM | POA: Diagnosis not present

## 2020-07-02 DIAGNOSIS — D631 Anemia in chronic kidney disease: Secondary | ICD-10-CM | POA: Diagnosis not present

## 2020-07-02 DIAGNOSIS — N186 End stage renal disease: Secondary | ICD-10-CM | POA: Diagnosis not present

## 2020-07-02 DIAGNOSIS — Z992 Dependence on renal dialysis: Secondary | ICD-10-CM | POA: Diagnosis not present

## 2020-07-04 DIAGNOSIS — Z992 Dependence on renal dialysis: Secondary | ICD-10-CM | POA: Diagnosis not present

## 2020-07-04 DIAGNOSIS — D631 Anemia in chronic kidney disease: Secondary | ICD-10-CM | POA: Diagnosis not present

## 2020-07-04 DIAGNOSIS — N186 End stage renal disease: Secondary | ICD-10-CM | POA: Diagnosis not present

## 2020-07-04 DIAGNOSIS — D509 Iron deficiency anemia, unspecified: Secondary | ICD-10-CM | POA: Diagnosis not present

## 2020-07-04 DIAGNOSIS — N2581 Secondary hyperparathyroidism of renal origin: Secondary | ICD-10-CM | POA: Diagnosis not present

## 2020-07-06 DIAGNOSIS — D631 Anemia in chronic kidney disease: Secondary | ICD-10-CM | POA: Diagnosis not present

## 2020-07-06 DIAGNOSIS — D509 Iron deficiency anemia, unspecified: Secondary | ICD-10-CM | POA: Diagnosis not present

## 2020-07-06 DIAGNOSIS — Z992 Dependence on renal dialysis: Secondary | ICD-10-CM | POA: Diagnosis not present

## 2020-07-06 DIAGNOSIS — N2581 Secondary hyperparathyroidism of renal origin: Secondary | ICD-10-CM | POA: Diagnosis not present

## 2020-07-06 DIAGNOSIS — N186 End stage renal disease: Secondary | ICD-10-CM | POA: Diagnosis not present

## 2020-07-09 DIAGNOSIS — D509 Iron deficiency anemia, unspecified: Secondary | ICD-10-CM | POA: Diagnosis not present

## 2020-07-09 DIAGNOSIS — Z992 Dependence on renal dialysis: Secondary | ICD-10-CM | POA: Diagnosis not present

## 2020-07-09 DIAGNOSIS — N2581 Secondary hyperparathyroidism of renal origin: Secondary | ICD-10-CM | POA: Diagnosis not present

## 2020-07-09 DIAGNOSIS — D631 Anemia in chronic kidney disease: Secondary | ICD-10-CM | POA: Diagnosis not present

## 2020-07-09 DIAGNOSIS — N186 End stage renal disease: Secondary | ICD-10-CM | POA: Diagnosis not present

## 2020-07-11 DIAGNOSIS — D509 Iron deficiency anemia, unspecified: Secondary | ICD-10-CM | POA: Diagnosis not present

## 2020-07-11 DIAGNOSIS — N2581 Secondary hyperparathyroidism of renal origin: Secondary | ICD-10-CM | POA: Diagnosis not present

## 2020-07-11 DIAGNOSIS — D631 Anemia in chronic kidney disease: Secondary | ICD-10-CM | POA: Diagnosis not present

## 2020-07-11 DIAGNOSIS — Z992 Dependence on renal dialysis: Secondary | ICD-10-CM | POA: Diagnosis not present

## 2020-07-11 DIAGNOSIS — N186 End stage renal disease: Secondary | ICD-10-CM | POA: Diagnosis not present

## 2020-07-13 DIAGNOSIS — N186 End stage renal disease: Secondary | ICD-10-CM | POA: Diagnosis not present

## 2020-07-13 DIAGNOSIS — D509 Iron deficiency anemia, unspecified: Secondary | ICD-10-CM | POA: Diagnosis not present

## 2020-07-13 DIAGNOSIS — N2581 Secondary hyperparathyroidism of renal origin: Secondary | ICD-10-CM | POA: Diagnosis not present

## 2020-07-13 DIAGNOSIS — D631 Anemia in chronic kidney disease: Secondary | ICD-10-CM | POA: Diagnosis not present

## 2020-07-13 DIAGNOSIS — Z992 Dependence on renal dialysis: Secondary | ICD-10-CM | POA: Diagnosis not present

## 2020-07-16 DIAGNOSIS — D631 Anemia in chronic kidney disease: Secondary | ICD-10-CM | POA: Diagnosis not present

## 2020-07-16 DIAGNOSIS — D509 Iron deficiency anemia, unspecified: Secondary | ICD-10-CM | POA: Diagnosis not present

## 2020-07-16 DIAGNOSIS — N2581 Secondary hyperparathyroidism of renal origin: Secondary | ICD-10-CM | POA: Diagnosis not present

## 2020-07-16 DIAGNOSIS — Z992 Dependence on renal dialysis: Secondary | ICD-10-CM | POA: Diagnosis not present

## 2020-07-16 DIAGNOSIS — N186 End stage renal disease: Secondary | ICD-10-CM | POA: Diagnosis not present

## 2020-07-17 ENCOUNTER — Other Ambulatory Visit: Payer: Self-pay | Admitting: Cardiology

## 2020-07-18 DIAGNOSIS — N186 End stage renal disease: Secondary | ICD-10-CM | POA: Diagnosis not present

## 2020-07-18 DIAGNOSIS — R002 Palpitations: Secondary | ICD-10-CM | POA: Diagnosis not present

## 2020-07-18 DIAGNOSIS — Z992 Dependence on renal dialysis: Secondary | ICD-10-CM | POA: Diagnosis not present

## 2020-07-18 DIAGNOSIS — I10 Essential (primary) hypertension: Secondary | ICD-10-CM | POA: Diagnosis not present

## 2020-07-18 DIAGNOSIS — D509 Iron deficiency anemia, unspecified: Secondary | ICD-10-CM | POA: Diagnosis not present

## 2020-07-18 DIAGNOSIS — N2581 Secondary hyperparathyroidism of renal origin: Secondary | ICD-10-CM | POA: Diagnosis not present

## 2020-07-18 DIAGNOSIS — D631 Anemia in chronic kidney disease: Secondary | ICD-10-CM | POA: Diagnosis not present

## 2020-07-20 DIAGNOSIS — N2581 Secondary hyperparathyroidism of renal origin: Secondary | ICD-10-CM | POA: Diagnosis not present

## 2020-07-20 DIAGNOSIS — N186 End stage renal disease: Secondary | ICD-10-CM | POA: Diagnosis not present

## 2020-07-20 DIAGNOSIS — Z992 Dependence on renal dialysis: Secondary | ICD-10-CM | POA: Diagnosis not present

## 2020-07-20 DIAGNOSIS — D509 Iron deficiency anemia, unspecified: Secondary | ICD-10-CM | POA: Diagnosis not present

## 2020-07-20 DIAGNOSIS — D631 Anemia in chronic kidney disease: Secondary | ICD-10-CM | POA: Diagnosis not present

## 2020-07-20 DIAGNOSIS — N250 Renal osteodystrophy: Secondary | ICD-10-CM | POA: Diagnosis not present

## 2020-07-23 DIAGNOSIS — N2581 Secondary hyperparathyroidism of renal origin: Secondary | ICD-10-CM | POA: Diagnosis not present

## 2020-07-23 DIAGNOSIS — D631 Anemia in chronic kidney disease: Secondary | ICD-10-CM | POA: Diagnosis not present

## 2020-07-23 DIAGNOSIS — N250 Renal osteodystrophy: Secondary | ICD-10-CM | POA: Diagnosis not present

## 2020-07-23 DIAGNOSIS — D509 Iron deficiency anemia, unspecified: Secondary | ICD-10-CM | POA: Diagnosis not present

## 2020-07-23 DIAGNOSIS — N186 End stage renal disease: Secondary | ICD-10-CM | POA: Diagnosis not present

## 2020-07-23 DIAGNOSIS — Z992 Dependence on renal dialysis: Secondary | ICD-10-CM | POA: Diagnosis not present

## 2020-07-24 DIAGNOSIS — I10 Essential (primary) hypertension: Secondary | ICD-10-CM | POA: Diagnosis not present

## 2020-07-24 DIAGNOSIS — M549 Dorsalgia, unspecified: Secondary | ICD-10-CM | POA: Diagnosis not present

## 2020-07-24 DIAGNOSIS — Z299 Encounter for prophylactic measures, unspecified: Secondary | ICD-10-CM | POA: Diagnosis not present

## 2020-07-25 DIAGNOSIS — D631 Anemia in chronic kidney disease: Secondary | ICD-10-CM | POA: Diagnosis not present

## 2020-07-25 DIAGNOSIS — D509 Iron deficiency anemia, unspecified: Secondary | ICD-10-CM | POA: Diagnosis not present

## 2020-07-25 DIAGNOSIS — N186 End stage renal disease: Secondary | ICD-10-CM | POA: Diagnosis not present

## 2020-07-25 DIAGNOSIS — N250 Renal osteodystrophy: Secondary | ICD-10-CM | POA: Diagnosis not present

## 2020-07-25 DIAGNOSIS — Z992 Dependence on renal dialysis: Secondary | ICD-10-CM | POA: Diagnosis not present

## 2020-07-25 DIAGNOSIS — N2581 Secondary hyperparathyroidism of renal origin: Secondary | ICD-10-CM | POA: Diagnosis not present

## 2020-07-27 DIAGNOSIS — N186 End stage renal disease: Secondary | ICD-10-CM | POA: Diagnosis not present

## 2020-07-27 DIAGNOSIS — N250 Renal osteodystrophy: Secondary | ICD-10-CM | POA: Diagnosis not present

## 2020-07-27 DIAGNOSIS — Z992 Dependence on renal dialysis: Secondary | ICD-10-CM | POA: Diagnosis not present

## 2020-07-27 DIAGNOSIS — D631 Anemia in chronic kidney disease: Secondary | ICD-10-CM | POA: Diagnosis not present

## 2020-07-27 DIAGNOSIS — N2581 Secondary hyperparathyroidism of renal origin: Secondary | ICD-10-CM | POA: Diagnosis not present

## 2020-07-27 DIAGNOSIS — D509 Iron deficiency anemia, unspecified: Secondary | ICD-10-CM | POA: Diagnosis not present

## 2020-07-30 DIAGNOSIS — Z992 Dependence on renal dialysis: Secondary | ICD-10-CM | POA: Diagnosis not present

## 2020-07-30 DIAGNOSIS — N250 Renal osteodystrophy: Secondary | ICD-10-CM | POA: Diagnosis not present

## 2020-07-30 DIAGNOSIS — D631 Anemia in chronic kidney disease: Secondary | ICD-10-CM | POA: Diagnosis not present

## 2020-07-30 DIAGNOSIS — D509 Iron deficiency anemia, unspecified: Secondary | ICD-10-CM | POA: Diagnosis not present

## 2020-07-30 DIAGNOSIS — N186 End stage renal disease: Secondary | ICD-10-CM | POA: Diagnosis not present

## 2020-07-30 DIAGNOSIS — N2581 Secondary hyperparathyroidism of renal origin: Secondary | ICD-10-CM | POA: Diagnosis not present

## 2020-08-01 DIAGNOSIS — Z992 Dependence on renal dialysis: Secondary | ICD-10-CM | POA: Diagnosis not present

## 2020-08-01 DIAGNOSIS — N186 End stage renal disease: Secondary | ICD-10-CM | POA: Diagnosis not present

## 2020-08-01 DIAGNOSIS — D509 Iron deficiency anemia, unspecified: Secondary | ICD-10-CM | POA: Diagnosis not present

## 2020-08-01 DIAGNOSIS — D631 Anemia in chronic kidney disease: Secondary | ICD-10-CM | POA: Diagnosis not present

## 2020-08-01 DIAGNOSIS — N2581 Secondary hyperparathyroidism of renal origin: Secondary | ICD-10-CM | POA: Diagnosis not present

## 2020-08-01 DIAGNOSIS — N250 Renal osteodystrophy: Secondary | ICD-10-CM | POA: Diagnosis not present

## 2020-08-02 ENCOUNTER — Ambulatory Visit (INDEPENDENT_AMBULATORY_CARE_PROVIDER_SITE_OTHER): Payer: Medicare Other

## 2020-08-02 DIAGNOSIS — I429 Cardiomyopathy, unspecified: Secondary | ICD-10-CM

## 2020-08-03 DIAGNOSIS — D509 Iron deficiency anemia, unspecified: Secondary | ICD-10-CM | POA: Diagnosis not present

## 2020-08-03 DIAGNOSIS — N250 Renal osteodystrophy: Secondary | ICD-10-CM | POA: Diagnosis not present

## 2020-08-03 DIAGNOSIS — N2581 Secondary hyperparathyroidism of renal origin: Secondary | ICD-10-CM | POA: Diagnosis not present

## 2020-08-03 DIAGNOSIS — D631 Anemia in chronic kidney disease: Secondary | ICD-10-CM | POA: Diagnosis not present

## 2020-08-03 DIAGNOSIS — Z992 Dependence on renal dialysis: Secondary | ICD-10-CM | POA: Diagnosis not present

## 2020-08-03 DIAGNOSIS — N186 End stage renal disease: Secondary | ICD-10-CM | POA: Diagnosis not present

## 2020-08-05 LAB — CUP PACEART REMOTE DEVICE CHECK
Battery Impedance: 1159 Ohm
Battery Remaining Longevity: 54 mo
Battery Voltage: 2.77 V
Brady Statistic AP VP Percent: 2 %
Brady Statistic AP VS Percent: 0 %
Brady Statistic AS VP Percent: 1 %
Brady Statistic AS VS Percent: 97 %
Date Time Interrogation Session: 20220715111349
Implantable Lead Implant Date: 20080604
Implantable Lead Implant Date: 20080604
Implantable Lead Location: 753859
Implantable Lead Location: 753860
Implantable Lead Model: 4469
Implantable Lead Model: 4470
Implantable Lead Serial Number: 498205
Implantable Lead Serial Number: 601713
Implantable Pulse Generator Implant Date: 20130212
Lead Channel Impedance Value: 442 Ohm
Lead Channel Impedance Value: 470 Ohm
Lead Channel Pacing Threshold Amplitude: 0.5 V
Lead Channel Pacing Threshold Amplitude: 0.625 V
Lead Channel Pacing Threshold Pulse Width: 0.4 ms
Lead Channel Pacing Threshold Pulse Width: 0.4 ms
Lead Channel Setting Pacing Amplitude: 2 V
Lead Channel Setting Pacing Amplitude: 2.5 V
Lead Channel Setting Pacing Pulse Width: 0.4 ms
Lead Channel Setting Sensing Sensitivity: 2.8 mV

## 2020-08-06 DIAGNOSIS — Z992 Dependence on renal dialysis: Secondary | ICD-10-CM | POA: Diagnosis not present

## 2020-08-06 DIAGNOSIS — D631 Anemia in chronic kidney disease: Secondary | ICD-10-CM | POA: Diagnosis not present

## 2020-08-06 DIAGNOSIS — D509 Iron deficiency anemia, unspecified: Secondary | ICD-10-CM | POA: Diagnosis not present

## 2020-08-06 DIAGNOSIS — N2581 Secondary hyperparathyroidism of renal origin: Secondary | ICD-10-CM | POA: Diagnosis not present

## 2020-08-06 DIAGNOSIS — N186 End stage renal disease: Secondary | ICD-10-CM | POA: Diagnosis not present

## 2020-08-06 DIAGNOSIS — N250 Renal osteodystrophy: Secondary | ICD-10-CM | POA: Diagnosis not present

## 2020-08-08 DIAGNOSIS — N2581 Secondary hyperparathyroidism of renal origin: Secondary | ICD-10-CM | POA: Diagnosis not present

## 2020-08-08 DIAGNOSIS — N186 End stage renal disease: Secondary | ICD-10-CM | POA: Diagnosis not present

## 2020-08-08 DIAGNOSIS — N250 Renal osteodystrophy: Secondary | ICD-10-CM | POA: Diagnosis not present

## 2020-08-08 DIAGNOSIS — D631 Anemia in chronic kidney disease: Secondary | ICD-10-CM | POA: Diagnosis not present

## 2020-08-08 DIAGNOSIS — Z992 Dependence on renal dialysis: Secondary | ICD-10-CM | POA: Diagnosis not present

## 2020-08-08 DIAGNOSIS — D509 Iron deficiency anemia, unspecified: Secondary | ICD-10-CM | POA: Diagnosis not present

## 2020-08-10 DIAGNOSIS — N2581 Secondary hyperparathyroidism of renal origin: Secondary | ICD-10-CM | POA: Diagnosis not present

## 2020-08-10 DIAGNOSIS — D631 Anemia in chronic kidney disease: Secondary | ICD-10-CM | POA: Diagnosis not present

## 2020-08-10 DIAGNOSIS — N186 End stage renal disease: Secondary | ICD-10-CM | POA: Diagnosis not present

## 2020-08-10 DIAGNOSIS — N250 Renal osteodystrophy: Secondary | ICD-10-CM | POA: Diagnosis not present

## 2020-08-10 DIAGNOSIS — D509 Iron deficiency anemia, unspecified: Secondary | ICD-10-CM | POA: Diagnosis not present

## 2020-08-10 DIAGNOSIS — Z992 Dependence on renal dialysis: Secondary | ICD-10-CM | POA: Diagnosis not present

## 2020-08-13 DIAGNOSIS — N186 End stage renal disease: Secondary | ICD-10-CM | POA: Diagnosis not present

## 2020-08-13 DIAGNOSIS — N250 Renal osteodystrophy: Secondary | ICD-10-CM | POA: Diagnosis not present

## 2020-08-13 DIAGNOSIS — D509 Iron deficiency anemia, unspecified: Secondary | ICD-10-CM | POA: Diagnosis not present

## 2020-08-13 DIAGNOSIS — Z992 Dependence on renal dialysis: Secondary | ICD-10-CM | POA: Diagnosis not present

## 2020-08-13 DIAGNOSIS — N2581 Secondary hyperparathyroidism of renal origin: Secondary | ICD-10-CM | POA: Diagnosis not present

## 2020-08-13 DIAGNOSIS — D631 Anemia in chronic kidney disease: Secondary | ICD-10-CM | POA: Diagnosis not present

## 2020-08-14 ENCOUNTER — Ambulatory Visit (INDEPENDENT_AMBULATORY_CARE_PROVIDER_SITE_OTHER): Payer: Medicare Other | Admitting: Cardiology

## 2020-08-14 ENCOUNTER — Other Ambulatory Visit: Payer: Self-pay

## 2020-08-14 ENCOUNTER — Encounter: Payer: Self-pay | Admitting: Cardiology

## 2020-08-14 VITALS — BP 112/58 | HR 62 | Ht 68.0 in | Wt 173.8 lb

## 2020-08-14 DIAGNOSIS — I495 Sick sinus syndrome: Secondary | ICD-10-CM

## 2020-08-14 DIAGNOSIS — I429 Cardiomyopathy, unspecified: Secondary | ICD-10-CM

## 2020-08-14 DIAGNOSIS — I4819 Other persistent atrial fibrillation: Secondary | ICD-10-CM

## 2020-08-14 NOTE — Progress Notes (Signed)
Cardiology Office Note  Date: 08/14/2020   ID: Dale, Torres 1925-04-26, MRN [id number]  PCP:  Monico Blitz, MD  Cardiologist:  Rozann Lesches, MD Electrophysiologist:  None   Chief Complaint  Patient presents with   Cardiac follow-up    History of Present Illness: Dale Torres is a 87 y.o. male last seen in January.  He is here today for a routine visit.  He continues with regular hemodialysis, does not report any obvious change in cardiac status.  No angina symptoms or palpitations reported.  He is limited by arthritic pain, does use a rolling walker.  He has a Medtronic pacemaker in place with follow-up by Dr. Rayann Heman.  Recent device interrogation showed approximately 16% AF burden.  I went over his medications which are stable from a cardiac perspective and outlined below.  Also reviewed lab work from March.  Past Medical History:  Diagnosis Date   Anemia    Dr. Sonny Dandy   Arthritis    Benign prostatic hypertrophy    Carpal tunnel syndrome of left wrist    Coronary atherosclerosis of native coronary artery    Nonobstructive 2009   ESRD on hemodialysis [facility])    Essential hypertension    GERD (gastroesophageal reflux disease)    Headache    History of pneumonia    HOH (hard of hearing)    Bilateral  hearing aids   Hypothyroidism    Persistent atrial fibrillation (HCC)    Declines coumadin   Presence of permanent cardiac pacemaker    Sick sinus syndrome [facility])    Status post pacemaker placement 2008   Skin cancer, basal cell     Past Surgical History:  Procedure Laterality Date   A/V FISTULAGRAM N/A 01/06/2017   Procedure: A/V FISTULAGRAM;  Surgeon: Waynetta Sandy, MD;  Location: [place] CV [facility];  Service: Cardiovascular;  Laterality: N/A;   APPENDECTOMY     AV FISTULA PLACEMENT Left 08/16/2014   Procedure: Left Arm creation of Brachiocephalic ARTERIOVENOUS (AV) FISTULA ;  Surgeon: Angelia Mould, MD;  Location: [place];  Service:  Vascular;  Laterality: Left;   AV FISTULA PLACEMENT Right 06/24/2016   Procedure: CREATION OF RIGHT RADIOCEPHALIC ARTERIOVENOUS (AV);  Surgeon: Rosetta Posner, MD;  Location: [facility] OR;  Service: Vascular;  Laterality: Right;   CHOLECYSTECTOMY     COLONOSCOPY W/ BIOPSIES AND POLYPECTOMY     EYE SURGERY     INSERTION OF DIALYSIS CATHETER Left 06/24/2016   Procedure: INSERTION OF DIALYSIS CATHETER - left Internal Jugular placement;  Surgeon: Rosetta Posner, MD;  Location: [facility] OR;  Service: Vascular;  Laterality: Left;   IR FLUORO GUIDE CV LINE RIGHT  08/06/2016   LAMINECTOMY     LIGATION OF ARTERIOVENOUS  FISTULA Left 06/24/2016   Procedure: LIGATION OF left arm  ARTERIOVENOUS  FISTULA;  Surgeon: Rosetta Posner, MD;  Location: [place];  Service: Vascular;  Laterality: Left;   PACEMAKER GENERATOR CHANGE  03/03/11   MDT Adaptal L implanted by Dr Rayann Heman   PACEMAKER PLACEMENT  2008   Medtronic - Dr. Doreatha Lew   PERIPHERAL VASCULAR CATHETERIZATION Left 03/06/2015   Procedure: Fistulagram;  Surgeon: Serafina Mitchell, MD;  Location: [place] CV LAB;  Service: Cardiovascular;  Laterality: Left;   PERMANENT PACEMAKER GENERATOR CHANGE N/A 03/03/2011   Procedure: PERMANENT PACEMAKER GENERATOR CHANGE;  Surgeon: Thompson Grayer, MD;  Location: [facility] CATH LAB;  Service: Cardiovascular;  Laterality: N/A;   Right rotator cuff repair  THROMBECTOMY AND REVISION OF ARTERIOVENTOUS (AV) GORETEX  GRAFT Left 03/11/2015   Procedure: THROMBECTOMY AND REVISION OF ARTERIOVENTOUS (AV) GORETEX  GRAFT LEFT ARM;  Surgeon: Rosetta Posner, MD;  Location: [facility] OR;  Service: Vascular;  Laterality: Left;   TONSILLECTOMY      Current Outpatient Medications  Medication Sig Dispense Refill   amiodarone (PACERONE) 200 MG tablet Take 200 mg by mouth daily.     aspirin EC 81 MG tablet Take 81 mg by mouth at bedtime.     azelastine (ASTELIN) 0.1 % nasal spray Place 1 spray into both nostrils 2 (two) times daily. Use in each nostril as directed      carvedilol (COREG) 3.125 MG tablet TAKE 1 TABLET TWICE A DAY, EXCEPT DO NOT TAKE THE MORNING DOSE ON YOUR DIALYSIS DAYS 180 tablet 0   docusate sodium (COLACE) 100 MG capsule Take 100 mg by mouth daily.     ferric citrate (AURYXIA) 1 GM 210 MG(Fe) tablet Take 1 tablet by mouth 3 (three) times daily with meals.     folic acid (FOLVITE) 1 MG tablet Take 1 mg by mouth daily.     gabapentin (NEURONTIN) 400 MG capsule Take 400 mg by mouth at bedtime.     HYDROcodone-acetaminophen (NORCO) 10-325 MG tablet Take 1 tablet by mouth every 6 (six) hours as needed for severe pain. 15 tablet 0   levothyroxine (SYNTHROID, LEVOTHROID) 125 MCG tablet Take 125 mcg by mouth daily at 2 PM. In the afternoon.     Melatonin 5 MG TABS Take 5 mg by mouth at bedtime.     midodrine (PROAMATINE) 10 MG tablet Take 10 mg by mouth 2 (two) times daily.      naproxen sodium (ALEVE) 220 MG tablet Take 440 mg by mouth daily as needed (for pain.).     nitroGLYCERIN (NITROSTAT) 0.4 MG SL tablet Place 1 tablet (0.4 mg total) under the tongue every 5 (five) minutes as needed for chest pain (up to 3 doses. If no relief after 3rd dose, proceed to the ED for an evaluation or call 911). 25 tablet 3   omeprazole (PRILOSEC) 20 MG capsule Take 20 mg by mouth at bedtime.     tamsulosin (FLOMAX) 0.4 MG CAPS capsule Take 0.4 mg by mouth at bedtime.     No current facility-administered medications for this visit.   Allergies:  Oxycodone hcl   ROS: No recent falls or syncope.  Physical Exam: VS:  BP (!) 112/58   Pulse 62   Ht _0  (1.727 m)   Wt 173 lb 12.8 oz (78.8 kg)   SpO2 99%   BMI 26.43 kg/m , BMI Body mass index is 26.43 kg/m.  Wt Readings from Last 3 Encounters:  08/14/20 173 lb 12.8 oz (78.8 kg)  06/03/20 168 lb (76.2 kg)  02/12/20 176 lb (79.8 kg)    General: Elderly male, appears comfortable at rest. HEENT: Conjunctiva and lids normal, wearing a mask. Neck: Supple, no elevated JVP or carotid bruits, no  thyromegaly. Lungs: Clear to auscultation, nonlabored breathing at rest. Cardiac: Regular rate and rhythm, no S3, 1/6 systolic murmur, no pericardial rub. Extremities: No pitting edema.  ECG:  An ECG dated 02/12/2020 was personally reviewed today and demonstrated:  Ventricular pacing with underlying atrial fibrillation.  Recent Labwork: 03/27/2020: ALT 10; AST 17; TSH 1.70   Other Studies Reviewed Today:  Echocardiogram 06/06/2017 [facility]): LVEF 25 to 30%, MAC with moderate mitral regurgitation, moderate left atrial enlargement,  right ventricular dilatation with decreased contraction, mild to moderate tricuspid regurgitation, mild right atrial enlargement.  Assessment and Plan:  1.  Persistent atrial fibrillation with CHA2DS2-VASc score of 4.  He has consistently declined anticoagulation.  Last device interrogation indicated 16% rhythm burden, he continues on Coreg and amiodarone.  TSH and LFTs normal in March.  2.  Secondary cardiomyopathy managed conservatively, last LVEF 25 to 30% as of echocardiogram in 2019.  He is on Coreg with low blood pressure on hemodialysis precluding additional medical therapy adjustments.  Weights have been relatively stable.  3.  Sinus node dysfunction with Medtronic pacemaker in place and followed by Dr. Rayann Heman.  Medication Adjustments/Labs and Tests Ordered: Current medicines are reviewed at length with the patient today.  Concerns regarding medicines are outlined above.   Tests Ordered: No orders of the defined types were placed in this encounter.   Medication Changes: No orders of the defined types were placed in this encounter.   Disposition:  Follow up  6 months.  Signed, Satira Sark, MD, [facility] 08/14/2020 2:57 PM    [facility] [place], [place], [place] [address] Phone: [phone]; Fax: [phone]

## 2020-08-14 NOTE — Patient Instructions (Addendum)

## 2020-08-15 DIAGNOSIS — D631 Anemia in chronic kidney disease: Secondary | ICD-10-CM | POA: Diagnosis not present

## 2020-08-15 DIAGNOSIS — N250 Renal osteodystrophy: Secondary | ICD-10-CM | POA: Diagnosis not present

## 2020-08-15 DIAGNOSIS — Z992 Dependence on renal dialysis: Secondary | ICD-10-CM | POA: Diagnosis not present

## 2020-08-15 DIAGNOSIS — N186 End stage renal disease: Secondary | ICD-10-CM | POA: Diagnosis not present

## 2020-08-15 DIAGNOSIS — D509 Iron deficiency anemia, unspecified: Secondary | ICD-10-CM | POA: Diagnosis not present

## 2020-08-15 DIAGNOSIS — N2581 Secondary hyperparathyroidism of renal origin: Secondary | ICD-10-CM | POA: Diagnosis not present

## 2020-08-17 DIAGNOSIS — N250 Renal osteodystrophy: Secondary | ICD-10-CM | POA: Diagnosis not present

## 2020-08-17 DIAGNOSIS — D631 Anemia in chronic kidney disease: Secondary | ICD-10-CM | POA: Diagnosis not present

## 2020-08-17 DIAGNOSIS — N186 End stage renal disease: Secondary | ICD-10-CM | POA: Diagnosis not present

## 2020-08-17 DIAGNOSIS — D509 Iron deficiency anemia, unspecified: Secondary | ICD-10-CM | POA: Diagnosis not present

## 2020-08-17 DIAGNOSIS — Z992 Dependence on renal dialysis: Secondary | ICD-10-CM | POA: Diagnosis not present

## 2020-08-17 DIAGNOSIS — N2581 Secondary hyperparathyroidism of renal origin: Secondary | ICD-10-CM | POA: Diagnosis not present

## 2020-08-18 DIAGNOSIS — N186 End stage renal disease: Secondary | ICD-10-CM | POA: Diagnosis not present

## 2020-08-18 DIAGNOSIS — Z992 Dependence on renal dialysis: Secondary | ICD-10-CM | POA: Diagnosis not present

## 2020-08-20 DIAGNOSIS — Z992 Dependence on renal dialysis: Secondary | ICD-10-CM | POA: Diagnosis not present

## 2020-08-20 DIAGNOSIS — N2581 Secondary hyperparathyroidism of renal origin: Secondary | ICD-10-CM | POA: Diagnosis not present

## 2020-08-20 DIAGNOSIS — D631 Anemia in chronic kidney disease: Secondary | ICD-10-CM | POA: Diagnosis not present

## 2020-08-20 DIAGNOSIS — N186 End stage renal disease: Secondary | ICD-10-CM | POA: Diagnosis not present

## 2020-08-20 DIAGNOSIS — D509 Iron deficiency anemia, unspecified: Secondary | ICD-10-CM | POA: Diagnosis not present

## 2020-08-21 DIAGNOSIS — Z299 Encounter for prophylactic measures, unspecified: Secondary | ICD-10-CM | POA: Diagnosis not present

## 2020-08-21 DIAGNOSIS — I10 Essential (primary) hypertension: Secondary | ICD-10-CM | POA: Diagnosis not present

## 2020-08-21 DIAGNOSIS — G5600 Carpal tunnel syndrome, unspecified upper limb: Secondary | ICD-10-CM | POA: Diagnosis not present

## 2020-08-21 DIAGNOSIS — M1990 Unspecified osteoarthritis, unspecified site: Secondary | ICD-10-CM | POA: Diagnosis not present

## 2020-08-22 DIAGNOSIS — D509 Iron deficiency anemia, unspecified: Secondary | ICD-10-CM | POA: Diagnosis not present

## 2020-08-22 DIAGNOSIS — D631 Anemia in chronic kidney disease: Secondary | ICD-10-CM | POA: Diagnosis not present

## 2020-08-22 DIAGNOSIS — Z992 Dependence on renal dialysis: Secondary | ICD-10-CM | POA: Diagnosis not present

## 2020-08-22 DIAGNOSIS — N186 End stage renal disease: Secondary | ICD-10-CM | POA: Diagnosis not present

## 2020-08-22 DIAGNOSIS — N2581 Secondary hyperparathyroidism of renal origin: Secondary | ICD-10-CM | POA: Diagnosis not present

## 2020-08-24 DIAGNOSIS — N2581 Secondary hyperparathyroidism of renal origin: Secondary | ICD-10-CM | POA: Diagnosis not present

## 2020-08-24 DIAGNOSIS — Z992 Dependence on renal dialysis: Secondary | ICD-10-CM | POA: Diagnosis not present

## 2020-08-24 DIAGNOSIS — D631 Anemia in chronic kidney disease: Secondary | ICD-10-CM | POA: Diagnosis not present

## 2020-08-24 DIAGNOSIS — N186 End stage renal disease: Secondary | ICD-10-CM | POA: Diagnosis not present

## 2020-08-24 DIAGNOSIS — D509 Iron deficiency anemia, unspecified: Secondary | ICD-10-CM | POA: Diagnosis not present

## 2020-08-26 NOTE — Progress Notes (Signed)
Remote pacemaker transmission.   

## 2020-08-27 DIAGNOSIS — N2581 Secondary hyperparathyroidism of renal origin: Secondary | ICD-10-CM | POA: Diagnosis not present

## 2020-08-27 DIAGNOSIS — Z992 Dependence on renal dialysis: Secondary | ICD-10-CM | POA: Diagnosis not present

## 2020-08-27 DIAGNOSIS — N186 End stage renal disease: Secondary | ICD-10-CM | POA: Diagnosis not present

## 2020-08-27 DIAGNOSIS — D631 Anemia in chronic kidney disease: Secondary | ICD-10-CM | POA: Diagnosis not present

## 2020-08-27 DIAGNOSIS — D509 Iron deficiency anemia, unspecified: Secondary | ICD-10-CM | POA: Diagnosis not present

## 2020-08-29 DIAGNOSIS — N186 End stage renal disease: Secondary | ICD-10-CM | POA: Diagnosis not present

## 2020-08-29 DIAGNOSIS — D631 Anemia in chronic kidney disease: Secondary | ICD-10-CM | POA: Diagnosis not present

## 2020-08-29 DIAGNOSIS — N2581 Secondary hyperparathyroidism of renal origin: Secondary | ICD-10-CM | POA: Diagnosis not present

## 2020-08-29 DIAGNOSIS — D509 Iron deficiency anemia, unspecified: Secondary | ICD-10-CM | POA: Diagnosis not present

## 2020-08-29 DIAGNOSIS — Z992 Dependence on renal dialysis: Secondary | ICD-10-CM | POA: Diagnosis not present

## 2020-08-31 DIAGNOSIS — D631 Anemia in chronic kidney disease: Secondary | ICD-10-CM | POA: Diagnosis not present

## 2020-08-31 DIAGNOSIS — D509 Iron deficiency anemia, unspecified: Secondary | ICD-10-CM | POA: Diagnosis not present

## 2020-08-31 DIAGNOSIS — N186 End stage renal disease: Secondary | ICD-10-CM | POA: Diagnosis not present

## 2020-08-31 DIAGNOSIS — N2581 Secondary hyperparathyroidism of renal origin: Secondary | ICD-10-CM | POA: Diagnosis not present

## 2020-08-31 DIAGNOSIS — Z992 Dependence on renal dialysis: Secondary | ICD-10-CM | POA: Diagnosis not present

## 2020-09-03 DIAGNOSIS — D509 Iron deficiency anemia, unspecified: Secondary | ICD-10-CM | POA: Diagnosis not present

## 2020-09-03 DIAGNOSIS — N2581 Secondary hyperparathyroidism of renal origin: Secondary | ICD-10-CM | POA: Diagnosis not present

## 2020-09-03 DIAGNOSIS — N186 End stage renal disease: Secondary | ICD-10-CM | POA: Diagnosis not present

## 2020-09-03 DIAGNOSIS — Z992 Dependence on renal dialysis: Secondary | ICD-10-CM | POA: Diagnosis not present

## 2020-09-03 DIAGNOSIS — D631 Anemia in chronic kidney disease: Secondary | ICD-10-CM | POA: Diagnosis not present

## 2020-09-05 DIAGNOSIS — D509 Iron deficiency anemia, unspecified: Secondary | ICD-10-CM | POA: Diagnosis not present

## 2020-09-05 DIAGNOSIS — Z992 Dependence on renal dialysis: Secondary | ICD-10-CM | POA: Diagnosis not present

## 2020-09-05 DIAGNOSIS — N2581 Secondary hyperparathyroidism of renal origin: Secondary | ICD-10-CM | POA: Diagnosis not present

## 2020-09-05 DIAGNOSIS — N186 End stage renal disease: Secondary | ICD-10-CM | POA: Diagnosis not present

## 2020-09-05 DIAGNOSIS — D631 Anemia in chronic kidney disease: Secondary | ICD-10-CM | POA: Diagnosis not present

## 2020-09-07 DIAGNOSIS — Z992 Dependence on renal dialysis: Secondary | ICD-10-CM | POA: Diagnosis not present

## 2020-09-07 DIAGNOSIS — N2581 Secondary hyperparathyroidism of renal origin: Secondary | ICD-10-CM | POA: Diagnosis not present

## 2020-09-07 DIAGNOSIS — D631 Anemia in chronic kidney disease: Secondary | ICD-10-CM | POA: Diagnosis not present

## 2020-09-07 DIAGNOSIS — D509 Iron deficiency anemia, unspecified: Secondary | ICD-10-CM | POA: Diagnosis not present

## 2020-09-07 DIAGNOSIS — N186 End stage renal disease: Secondary | ICD-10-CM | POA: Diagnosis not present

## 2020-09-10 DIAGNOSIS — Z992 Dependence on renal dialysis: Secondary | ICD-10-CM | POA: Diagnosis not present

## 2020-09-10 DIAGNOSIS — D631 Anemia in chronic kidney disease: Secondary | ICD-10-CM | POA: Diagnosis not present

## 2020-09-10 DIAGNOSIS — D509 Iron deficiency anemia, unspecified: Secondary | ICD-10-CM | POA: Diagnosis not present

## 2020-09-10 DIAGNOSIS — N186 End stage renal disease: Secondary | ICD-10-CM | POA: Diagnosis not present

## 2020-09-10 DIAGNOSIS — N2581 Secondary hyperparathyroidism of renal origin: Secondary | ICD-10-CM | POA: Diagnosis not present

## 2020-09-11 DIAGNOSIS — M1990 Unspecified osteoarthritis, unspecified site: Secondary | ICD-10-CM | POA: Diagnosis not present

## 2020-09-11 DIAGNOSIS — I4891 Unspecified atrial fibrillation: Secondary | ICD-10-CM | POA: Diagnosis not present

## 2020-09-11 DIAGNOSIS — Z299 Encounter for prophylactic measures, unspecified: Secondary | ICD-10-CM | POA: Diagnosis not present

## 2020-09-11 DIAGNOSIS — I10 Essential (primary) hypertension: Secondary | ICD-10-CM | POA: Diagnosis not present

## 2020-09-11 DIAGNOSIS — J019 Acute sinusitis, unspecified: Secondary | ICD-10-CM | POA: Diagnosis not present

## 2020-09-12 DIAGNOSIS — N2581 Secondary hyperparathyroidism of renal origin: Secondary | ICD-10-CM | POA: Diagnosis not present

## 2020-09-12 DIAGNOSIS — D631 Anemia in chronic kidney disease: Secondary | ICD-10-CM | POA: Diagnosis not present

## 2020-09-12 DIAGNOSIS — D509 Iron deficiency anemia, unspecified: Secondary | ICD-10-CM | POA: Diagnosis not present

## 2020-09-12 DIAGNOSIS — Z992 Dependence on renal dialysis: Secondary | ICD-10-CM | POA: Diagnosis not present

## 2020-09-12 DIAGNOSIS — N186 End stage renal disease: Secondary | ICD-10-CM | POA: Diagnosis not present

## 2020-09-14 DIAGNOSIS — D509 Iron deficiency anemia, unspecified: Secondary | ICD-10-CM | POA: Diagnosis not present

## 2020-09-14 DIAGNOSIS — N186 End stage renal disease: Secondary | ICD-10-CM | POA: Diagnosis not present

## 2020-09-14 DIAGNOSIS — Z992 Dependence on renal dialysis: Secondary | ICD-10-CM | POA: Diagnosis not present

## 2020-09-14 DIAGNOSIS — N2581 Secondary hyperparathyroidism of renal origin: Secondary | ICD-10-CM | POA: Diagnosis not present

## 2020-09-14 DIAGNOSIS — D631 Anemia in chronic kidney disease: Secondary | ICD-10-CM | POA: Diagnosis not present

## 2020-09-17 DIAGNOSIS — Z992 Dependence on renal dialysis: Secondary | ICD-10-CM | POA: Diagnosis not present

## 2020-09-17 DIAGNOSIS — N2581 Secondary hyperparathyroidism of renal origin: Secondary | ICD-10-CM | POA: Diagnosis not present

## 2020-09-17 DIAGNOSIS — D509 Iron deficiency anemia, unspecified: Secondary | ICD-10-CM | POA: Diagnosis not present

## 2020-09-17 DIAGNOSIS — D631 Anemia in chronic kidney disease: Secondary | ICD-10-CM | POA: Diagnosis not present

## 2020-09-17 DIAGNOSIS — N186 End stage renal disease: Secondary | ICD-10-CM | POA: Diagnosis not present

## 2020-09-18 DIAGNOSIS — Z992 Dependence on renal dialysis: Secondary | ICD-10-CM | POA: Diagnosis not present

## 2020-09-18 DIAGNOSIS — N186 End stage renal disease: Secondary | ICD-10-CM | POA: Diagnosis not present

## 2020-09-19 DIAGNOSIS — N2581 Secondary hyperparathyroidism of renal origin: Secondary | ICD-10-CM | POA: Diagnosis not present

## 2020-09-19 DIAGNOSIS — N186 End stage renal disease: Secondary | ICD-10-CM | POA: Diagnosis not present

## 2020-09-19 DIAGNOSIS — D631 Anemia in chronic kidney disease: Secondary | ICD-10-CM | POA: Diagnosis not present

## 2020-09-19 DIAGNOSIS — Z992 Dependence on renal dialysis: Secondary | ICD-10-CM | POA: Diagnosis not present

## 2020-09-19 DIAGNOSIS — D509 Iron deficiency anemia, unspecified: Secondary | ICD-10-CM | POA: Diagnosis not present

## 2020-09-21 DIAGNOSIS — D509 Iron deficiency anemia, unspecified: Secondary | ICD-10-CM | POA: Diagnosis not present

## 2020-09-21 DIAGNOSIS — Z992 Dependence on renal dialysis: Secondary | ICD-10-CM | POA: Diagnosis not present

## 2020-09-21 DIAGNOSIS — D631 Anemia in chronic kidney disease: Secondary | ICD-10-CM | POA: Diagnosis not present

## 2020-09-21 DIAGNOSIS — N2581 Secondary hyperparathyroidism of renal origin: Secondary | ICD-10-CM | POA: Diagnosis not present

## 2020-09-21 DIAGNOSIS — N186 End stage renal disease: Secondary | ICD-10-CM | POA: Diagnosis not present

## 2020-09-22 ENCOUNTER — Other Ambulatory Visit: Payer: Self-pay | Admitting: Cardiology

## 2020-09-24 DIAGNOSIS — N186 End stage renal disease: Secondary | ICD-10-CM | POA: Diagnosis not present

## 2020-09-24 DIAGNOSIS — D509 Iron deficiency anemia, unspecified: Secondary | ICD-10-CM | POA: Diagnosis not present

## 2020-09-24 DIAGNOSIS — D631 Anemia in chronic kidney disease: Secondary | ICD-10-CM | POA: Diagnosis not present

## 2020-09-24 DIAGNOSIS — N2581 Secondary hyperparathyroidism of renal origin: Secondary | ICD-10-CM | POA: Diagnosis not present

## 2020-09-24 DIAGNOSIS — Z992 Dependence on renal dialysis: Secondary | ICD-10-CM | POA: Diagnosis not present

## 2020-09-26 DIAGNOSIS — N186 End stage renal disease: Secondary | ICD-10-CM | POA: Diagnosis not present

## 2020-09-26 DIAGNOSIS — D631 Anemia in chronic kidney disease: Secondary | ICD-10-CM | POA: Diagnosis not present

## 2020-09-26 DIAGNOSIS — N2581 Secondary hyperparathyroidism of renal origin: Secondary | ICD-10-CM | POA: Diagnosis not present

## 2020-09-26 DIAGNOSIS — Z992 Dependence on renal dialysis: Secondary | ICD-10-CM | POA: Diagnosis not present

## 2020-09-26 DIAGNOSIS — D509 Iron deficiency anemia, unspecified: Secondary | ICD-10-CM | POA: Diagnosis not present

## 2020-09-28 DIAGNOSIS — D631 Anemia in chronic kidney disease: Secondary | ICD-10-CM | POA: Diagnosis not present

## 2020-09-28 DIAGNOSIS — D509 Iron deficiency anemia, unspecified: Secondary | ICD-10-CM | POA: Diagnosis not present

## 2020-09-28 DIAGNOSIS — N2581 Secondary hyperparathyroidism of renal origin: Secondary | ICD-10-CM | POA: Diagnosis not present

## 2020-09-28 DIAGNOSIS — N186 End stage renal disease: Secondary | ICD-10-CM | POA: Diagnosis not present

## 2020-09-28 DIAGNOSIS — Z992 Dependence on renal dialysis: Secondary | ICD-10-CM | POA: Diagnosis not present

## 2020-10-01 DIAGNOSIS — N186 End stage renal disease: Secondary | ICD-10-CM | POA: Diagnosis not present

## 2020-10-01 DIAGNOSIS — N2581 Secondary hyperparathyroidism of renal origin: Secondary | ICD-10-CM | POA: Diagnosis not present

## 2020-10-01 DIAGNOSIS — D509 Iron deficiency anemia, unspecified: Secondary | ICD-10-CM | POA: Diagnosis not present

## 2020-10-01 DIAGNOSIS — Z992 Dependence on renal dialysis: Secondary | ICD-10-CM | POA: Diagnosis not present

## 2020-10-01 DIAGNOSIS — D631 Anemia in chronic kidney disease: Secondary | ICD-10-CM | POA: Diagnosis not present

## 2020-10-03 DIAGNOSIS — Z992 Dependence on renal dialysis: Secondary | ICD-10-CM | POA: Diagnosis not present

## 2020-10-03 DIAGNOSIS — D509 Iron deficiency anemia, unspecified: Secondary | ICD-10-CM | POA: Diagnosis not present

## 2020-10-03 DIAGNOSIS — N186 End stage renal disease: Secondary | ICD-10-CM | POA: Diagnosis not present

## 2020-10-03 DIAGNOSIS — N2581 Secondary hyperparathyroidism of renal origin: Secondary | ICD-10-CM | POA: Diagnosis not present

## 2020-10-03 DIAGNOSIS — D631 Anemia in chronic kidney disease: Secondary | ICD-10-CM | POA: Diagnosis not present

## 2020-10-05 DIAGNOSIS — N2581 Secondary hyperparathyroidism of renal origin: Secondary | ICD-10-CM | POA: Diagnosis not present

## 2020-10-05 DIAGNOSIS — D509 Iron deficiency anemia, unspecified: Secondary | ICD-10-CM | POA: Diagnosis not present

## 2020-10-05 DIAGNOSIS — D631 Anemia in chronic kidney disease: Secondary | ICD-10-CM | POA: Diagnosis not present

## 2020-10-05 DIAGNOSIS — N186 End stage renal disease: Secondary | ICD-10-CM | POA: Diagnosis not present

## 2020-10-05 DIAGNOSIS — Z992 Dependence on renal dialysis: Secondary | ICD-10-CM | POA: Diagnosis not present

## 2020-10-08 DIAGNOSIS — N186 End stage renal disease: Secondary | ICD-10-CM | POA: Diagnosis not present

## 2020-10-08 DIAGNOSIS — D509 Iron deficiency anemia, unspecified: Secondary | ICD-10-CM | POA: Diagnosis not present

## 2020-10-08 DIAGNOSIS — N2581 Secondary hyperparathyroidism of renal origin: Secondary | ICD-10-CM | POA: Diagnosis not present

## 2020-10-08 DIAGNOSIS — D631 Anemia in chronic kidney disease: Secondary | ICD-10-CM | POA: Diagnosis not present

## 2020-10-08 DIAGNOSIS — Z992 Dependence on renal dialysis: Secondary | ICD-10-CM | POA: Diagnosis not present

## 2020-10-10 DIAGNOSIS — N2581 Secondary hyperparathyroidism of renal origin: Secondary | ICD-10-CM | POA: Diagnosis not present

## 2020-10-10 DIAGNOSIS — N186 End stage renal disease: Secondary | ICD-10-CM | POA: Diagnosis not present

## 2020-10-10 DIAGNOSIS — D509 Iron deficiency anemia, unspecified: Secondary | ICD-10-CM | POA: Diagnosis not present

## 2020-10-10 DIAGNOSIS — Z992 Dependence on renal dialysis: Secondary | ICD-10-CM | POA: Diagnosis not present

## 2020-10-10 DIAGNOSIS — D631 Anemia in chronic kidney disease: Secondary | ICD-10-CM | POA: Diagnosis not present

## 2020-10-12 DIAGNOSIS — N186 End stage renal disease: Secondary | ICD-10-CM | POA: Diagnosis not present

## 2020-10-12 DIAGNOSIS — D509 Iron deficiency anemia, unspecified: Secondary | ICD-10-CM | POA: Diagnosis not present

## 2020-10-12 DIAGNOSIS — D631 Anemia in chronic kidney disease: Secondary | ICD-10-CM | POA: Diagnosis not present

## 2020-10-12 DIAGNOSIS — N2581 Secondary hyperparathyroidism of renal origin: Secondary | ICD-10-CM | POA: Diagnosis not present

## 2020-10-12 DIAGNOSIS — Z992 Dependence on renal dialysis: Secondary | ICD-10-CM | POA: Diagnosis not present

## 2020-10-15 DIAGNOSIS — N2581 Secondary hyperparathyroidism of renal origin: Secondary | ICD-10-CM | POA: Diagnosis not present

## 2020-10-15 DIAGNOSIS — Z992 Dependence on renal dialysis: Secondary | ICD-10-CM | POA: Diagnosis not present

## 2020-10-15 DIAGNOSIS — N186 End stage renal disease: Secondary | ICD-10-CM | POA: Diagnosis not present

## 2020-10-15 DIAGNOSIS — D631 Anemia in chronic kidney disease: Secondary | ICD-10-CM | POA: Diagnosis not present

## 2020-10-15 DIAGNOSIS — D509 Iron deficiency anemia, unspecified: Secondary | ICD-10-CM | POA: Diagnosis not present

## 2020-10-17 DIAGNOSIS — D509 Iron deficiency anemia, unspecified: Secondary | ICD-10-CM | POA: Diagnosis not present

## 2020-10-17 DIAGNOSIS — N186 End stage renal disease: Secondary | ICD-10-CM | POA: Diagnosis not present

## 2020-10-17 DIAGNOSIS — D631 Anemia in chronic kidney disease: Secondary | ICD-10-CM | POA: Diagnosis not present

## 2020-10-17 DIAGNOSIS — Z992 Dependence on renal dialysis: Secondary | ICD-10-CM | POA: Diagnosis not present

## 2020-10-17 DIAGNOSIS — N2581 Secondary hyperparathyroidism of renal origin: Secondary | ICD-10-CM | POA: Diagnosis not present

## 2020-10-18 DIAGNOSIS — N186 End stage renal disease: Secondary | ICD-10-CM | POA: Diagnosis not present

## 2020-10-18 DIAGNOSIS — Z992 Dependence on renal dialysis: Secondary | ICD-10-CM | POA: Diagnosis not present

## 2020-10-19 DIAGNOSIS — D509 Iron deficiency anemia, unspecified: Secondary | ICD-10-CM | POA: Diagnosis not present

## 2020-10-19 DIAGNOSIS — D631 Anemia in chronic kidney disease: Secondary | ICD-10-CM | POA: Diagnosis not present

## 2020-10-19 DIAGNOSIS — Z992 Dependence on renal dialysis: Secondary | ICD-10-CM | POA: Diagnosis not present

## 2020-10-19 DIAGNOSIS — N186 End stage renal disease: Secondary | ICD-10-CM | POA: Diagnosis not present

## 2020-10-19 DIAGNOSIS — N2581 Secondary hyperparathyroidism of renal origin: Secondary | ICD-10-CM | POA: Diagnosis not present

## 2020-10-21 DIAGNOSIS — I10 Essential (primary) hypertension: Secondary | ICD-10-CM | POA: Diagnosis not present

## 2020-10-21 DIAGNOSIS — Z7189 Other specified counseling: Secondary | ICD-10-CM | POA: Diagnosis not present

## 2020-10-21 DIAGNOSIS — Z6825 Body mass index (BMI) 25.0-25.9, adult: Secondary | ICD-10-CM | POA: Diagnosis not present

## 2020-10-21 DIAGNOSIS — Z23 Encounter for immunization: Secondary | ICD-10-CM | POA: Diagnosis not present

## 2020-10-21 DIAGNOSIS — Z1339 Encounter for screening examination for other mental health and behavioral disorders: Secondary | ICD-10-CM | POA: Diagnosis not present

## 2020-10-21 DIAGNOSIS — R5383 Other fatigue: Secondary | ICD-10-CM | POA: Diagnosis not present

## 2020-10-21 DIAGNOSIS — Z299 Encounter for prophylactic measures, unspecified: Secondary | ICD-10-CM | POA: Diagnosis not present

## 2020-10-21 DIAGNOSIS — Z0000 Encounter for general adult medical examination without abnormal findings: Secondary | ICD-10-CM | POA: Diagnosis not present

## 2020-10-21 DIAGNOSIS — E7800 Pure hypercholesterolemia, unspecified: Secondary | ICD-10-CM | POA: Diagnosis not present

## 2020-10-21 DIAGNOSIS — E039 Hypothyroidism, unspecified: Secondary | ICD-10-CM | POA: Diagnosis not present

## 2020-10-21 DIAGNOSIS — Z1331 Encounter for screening for depression: Secondary | ICD-10-CM | POA: Diagnosis not present

## 2020-10-22 DIAGNOSIS — Z992 Dependence on renal dialysis: Secondary | ICD-10-CM | POA: Diagnosis not present

## 2020-10-22 DIAGNOSIS — D631 Anemia in chronic kidney disease: Secondary | ICD-10-CM | POA: Diagnosis not present

## 2020-10-22 DIAGNOSIS — D509 Iron deficiency anemia, unspecified: Secondary | ICD-10-CM | POA: Diagnosis not present

## 2020-10-22 DIAGNOSIS — N2581 Secondary hyperparathyroidism of renal origin: Secondary | ICD-10-CM | POA: Diagnosis not present

## 2020-10-22 DIAGNOSIS — N186 End stage renal disease: Secondary | ICD-10-CM | POA: Diagnosis not present

## 2020-10-24 DIAGNOSIS — N186 End stage renal disease: Secondary | ICD-10-CM | POA: Diagnosis not present

## 2020-10-24 DIAGNOSIS — Z992 Dependence on renal dialysis: Secondary | ICD-10-CM | POA: Diagnosis not present

## 2020-10-24 DIAGNOSIS — D631 Anemia in chronic kidney disease: Secondary | ICD-10-CM | POA: Diagnosis not present

## 2020-10-24 DIAGNOSIS — D509 Iron deficiency anemia, unspecified: Secondary | ICD-10-CM | POA: Diagnosis not present

## 2020-10-24 DIAGNOSIS — N2581 Secondary hyperparathyroidism of renal origin: Secondary | ICD-10-CM | POA: Diagnosis not present

## 2020-10-25 DIAGNOSIS — R5383 Other fatigue: Secondary | ICD-10-CM | POA: Diagnosis not present

## 2020-10-25 DIAGNOSIS — Z125 Encounter for screening for malignant neoplasm of prostate: Secondary | ICD-10-CM | POA: Diagnosis not present

## 2020-10-25 DIAGNOSIS — E7800 Pure hypercholesterolemia, unspecified: Secondary | ICD-10-CM | POA: Diagnosis not present

## 2020-10-25 DIAGNOSIS — E039 Hypothyroidism, unspecified: Secondary | ICD-10-CM | POA: Diagnosis not present

## 2020-10-25 DIAGNOSIS — Z79899 Other long term (current) drug therapy: Secondary | ICD-10-CM | POA: Diagnosis not present

## 2020-10-26 DIAGNOSIS — D631 Anemia in chronic kidney disease: Secondary | ICD-10-CM | POA: Diagnosis not present

## 2020-10-26 DIAGNOSIS — D509 Iron deficiency anemia, unspecified: Secondary | ICD-10-CM | POA: Diagnosis not present

## 2020-10-26 DIAGNOSIS — N186 End stage renal disease: Secondary | ICD-10-CM | POA: Diagnosis not present

## 2020-10-26 DIAGNOSIS — N2581 Secondary hyperparathyroidism of renal origin: Secondary | ICD-10-CM | POA: Diagnosis not present

## 2020-10-26 DIAGNOSIS — Z992 Dependence on renal dialysis: Secondary | ICD-10-CM | POA: Diagnosis not present

## 2020-10-29 DIAGNOSIS — D631 Anemia in chronic kidney disease: Secondary | ICD-10-CM | POA: Diagnosis not present

## 2020-10-29 DIAGNOSIS — N186 End stage renal disease: Secondary | ICD-10-CM | POA: Diagnosis not present

## 2020-10-29 DIAGNOSIS — N2581 Secondary hyperparathyroidism of renal origin: Secondary | ICD-10-CM | POA: Diagnosis not present

## 2020-10-29 DIAGNOSIS — D509 Iron deficiency anemia, unspecified: Secondary | ICD-10-CM | POA: Diagnosis not present

## 2020-10-29 DIAGNOSIS — Z992 Dependence on renal dialysis: Secondary | ICD-10-CM | POA: Diagnosis not present

## 2020-10-29 DIAGNOSIS — Z20822 Contact with and (suspected) exposure to covid-19: Secondary | ICD-10-CM | POA: Diagnosis not present

## 2020-10-31 DIAGNOSIS — Z992 Dependence on renal dialysis: Secondary | ICD-10-CM | POA: Diagnosis not present

## 2020-10-31 DIAGNOSIS — D631 Anemia in chronic kidney disease: Secondary | ICD-10-CM | POA: Diagnosis not present

## 2020-10-31 DIAGNOSIS — N2581 Secondary hyperparathyroidism of renal origin: Secondary | ICD-10-CM | POA: Diagnosis not present

## 2020-10-31 DIAGNOSIS — D509 Iron deficiency anemia, unspecified: Secondary | ICD-10-CM | POA: Diagnosis not present

## 2020-10-31 DIAGNOSIS — N186 End stage renal disease: Secondary | ICD-10-CM | POA: Diagnosis not present

## 2020-11-01 DIAGNOSIS — K5900 Constipation, unspecified: Secondary | ICD-10-CM | POA: Diagnosis not present

## 2020-11-01 DIAGNOSIS — N400 Enlarged prostate without lower urinary tract symptoms: Secondary | ICD-10-CM | POA: Diagnosis not present

## 2020-11-01 DIAGNOSIS — Z299 Encounter for prophylactic measures, unspecified: Secondary | ICD-10-CM | POA: Diagnosis not present

## 2020-11-01 DIAGNOSIS — R001 Bradycardia, unspecified: Secondary | ICD-10-CM | POA: Diagnosis not present

## 2020-11-01 DIAGNOSIS — I10 Essential (primary) hypertension: Secondary | ICD-10-CM | POA: Diagnosis not present

## 2020-11-02 DIAGNOSIS — D631 Anemia in chronic kidney disease: Secondary | ICD-10-CM | POA: Diagnosis not present

## 2020-11-02 DIAGNOSIS — Z992 Dependence on renal dialysis: Secondary | ICD-10-CM | POA: Diagnosis not present

## 2020-11-02 DIAGNOSIS — N2581 Secondary hyperparathyroidism of renal origin: Secondary | ICD-10-CM | POA: Diagnosis not present

## 2020-11-02 DIAGNOSIS — D509 Iron deficiency anemia, unspecified: Secondary | ICD-10-CM | POA: Diagnosis not present

## 2020-11-02 DIAGNOSIS — N186 End stage renal disease: Secondary | ICD-10-CM | POA: Diagnosis not present

## 2020-11-05 DIAGNOSIS — Z992 Dependence on renal dialysis: Secondary | ICD-10-CM | POA: Diagnosis not present

## 2020-11-05 DIAGNOSIS — N186 End stage renal disease: Secondary | ICD-10-CM | POA: Diagnosis not present

## 2020-11-06 DIAGNOSIS — M1711 Unilateral primary osteoarthritis, right knee: Secondary | ICD-10-CM | POA: Diagnosis not present

## 2020-11-06 DIAGNOSIS — R102 Pelvic and perineal pain: Secondary | ICD-10-CM | POA: Diagnosis not present

## 2020-11-06 DIAGNOSIS — K5900 Constipation, unspecified: Secondary | ICD-10-CM | POA: Diagnosis not present

## 2020-11-06 DIAGNOSIS — M170 Bilateral primary osteoarthritis of knee: Secondary | ICD-10-CM | POA: Diagnosis not present

## 2020-11-06 DIAGNOSIS — N261 Atrophy of kidney (terminal): Secondary | ICD-10-CM | POA: Diagnosis not present

## 2020-11-06 DIAGNOSIS — K449 Diaphragmatic hernia without obstruction or gangrene: Secondary | ICD-10-CM | POA: Diagnosis not present

## 2020-11-06 DIAGNOSIS — Z87891 Personal history of nicotine dependence: Secondary | ICD-10-CM | POA: Diagnosis not present

## 2020-11-06 DIAGNOSIS — M25569 Pain in unspecified knee: Secondary | ICD-10-CM | POA: Diagnosis not present

## 2020-11-06 DIAGNOSIS — N400 Enlarged prostate without lower urinary tract symptoms: Secondary | ICD-10-CM | POA: Diagnosis not present

## 2020-11-06 DIAGNOSIS — Z992 Dependence on renal dialysis: Secondary | ICD-10-CM | POA: Diagnosis not present

## 2020-11-06 DIAGNOSIS — N200 Calculus of kidney: Secondary | ICD-10-CM | POA: Diagnosis not present

## 2020-11-06 DIAGNOSIS — L03115 Cellulitis of right lower limb: Secondary | ICD-10-CM | POA: Diagnosis not present

## 2020-11-06 DIAGNOSIS — L03116 Cellulitis of left lower limb: Secondary | ICD-10-CM | POA: Diagnosis not present

## 2020-11-06 DIAGNOSIS — M1712 Unilateral primary osteoarthritis, left knee: Secondary | ICD-10-CM | POA: Diagnosis not present

## 2020-11-07 DIAGNOSIS — N186 End stage renal disease: Secondary | ICD-10-CM | POA: Diagnosis not present

## 2020-11-07 DIAGNOSIS — Z992 Dependence on renal dialysis: Secondary | ICD-10-CM | POA: Diagnosis not present

## 2020-11-09 DIAGNOSIS — Z992 Dependence on renal dialysis: Secondary | ICD-10-CM | POA: Diagnosis not present

## 2020-11-09 DIAGNOSIS — N186 End stage renal disease: Secondary | ICD-10-CM | POA: Diagnosis not present

## 2020-11-12 DIAGNOSIS — N2581 Secondary hyperparathyroidism of renal origin: Secondary | ICD-10-CM | POA: Diagnosis not present

## 2020-11-12 DIAGNOSIS — D631 Anemia in chronic kidney disease: Secondary | ICD-10-CM | POA: Diagnosis not present

## 2020-11-12 DIAGNOSIS — D509 Iron deficiency anemia, unspecified: Secondary | ICD-10-CM | POA: Diagnosis not present

## 2020-11-12 DIAGNOSIS — Z992 Dependence on renal dialysis: Secondary | ICD-10-CM | POA: Diagnosis not present

## 2020-11-12 DIAGNOSIS — N186 End stage renal disease: Secondary | ICD-10-CM | POA: Diagnosis not present

## 2020-11-14 DIAGNOSIS — Z992 Dependence on renal dialysis: Secondary | ICD-10-CM | POA: Diagnosis not present

## 2020-11-14 DIAGNOSIS — N2581 Secondary hyperparathyroidism of renal origin: Secondary | ICD-10-CM | POA: Diagnosis not present

## 2020-11-14 DIAGNOSIS — D509 Iron deficiency anemia, unspecified: Secondary | ICD-10-CM | POA: Diagnosis not present

## 2020-11-14 DIAGNOSIS — D631 Anemia in chronic kidney disease: Secondary | ICD-10-CM | POA: Diagnosis not present

## 2020-11-14 DIAGNOSIS — N186 End stage renal disease: Secondary | ICD-10-CM | POA: Diagnosis not present

## 2020-11-16 DIAGNOSIS — D631 Anemia in chronic kidney disease: Secondary | ICD-10-CM | POA: Diagnosis not present

## 2020-11-16 DIAGNOSIS — N186 End stage renal disease: Secondary | ICD-10-CM | POA: Diagnosis not present

## 2020-11-16 DIAGNOSIS — D509 Iron deficiency anemia, unspecified: Secondary | ICD-10-CM | POA: Diagnosis not present

## 2020-11-16 DIAGNOSIS — N2581 Secondary hyperparathyroidism of renal origin: Secondary | ICD-10-CM | POA: Diagnosis not present

## 2020-11-16 DIAGNOSIS — Z992 Dependence on renal dialysis: Secondary | ICD-10-CM | POA: Diagnosis not present

## 2020-11-18 DIAGNOSIS — R4020 Unspecified coma: Secondary | ICD-10-CM | POA: Diagnosis not present

## 2020-11-18 DIAGNOSIS — R069 Unspecified abnormalities of breathing: Secondary | ICD-10-CM | POA: Diagnosis not present

## 2020-11-18 DIAGNOSIS — I499 Cardiac arrhythmia, unspecified: Secondary | ICD-10-CM | POA: Diagnosis not present

## 2020-11-18 DIAGNOSIS — R404 Transient alteration of awareness: Secondary | ICD-10-CM | POA: Diagnosis not present

## 2020-11-18 DIAGNOSIS — R0602 Shortness of breath: Secondary | ICD-10-CM | POA: Diagnosis not present

## 2020-11-19 DEATH — deceased

## 2021-02-05 ENCOUNTER — Ambulatory Visit: Payer: Medicare Other | Admitting: Cardiology
